# Patient Record
Sex: Female | Born: 1960
Health system: Southern US, Community
[De-identification: ages and names within clinical notes are randomized; demographics above are authoritative.]

## PROBLEM LIST (undated history)

## (undated) DIAGNOSIS — R200 Anesthesia of skin: Secondary | ICD-10-CM

## (undated) DIAGNOSIS — C801 Malignant (primary) neoplasm, unspecified: Secondary | ICD-10-CM

## (undated) DIAGNOSIS — I2699 Other pulmonary embolism without acute cor pulmonale: Secondary | ICD-10-CM

## (undated) DIAGNOSIS — T7840XA Allergy, unspecified, initial encounter: Secondary | ICD-10-CM

## (undated) DIAGNOSIS — D649 Anemia, unspecified: Secondary | ICD-10-CM

## (undated) DIAGNOSIS — I82409 Acute embolism and thrombosis of unspecified deep veins of unspecified lower extremity: Secondary | ICD-10-CM

## (undated) DIAGNOSIS — G609 Hereditary and idiopathic neuropathy, unspecified: Secondary | ICD-10-CM

## (undated) DIAGNOSIS — M8580 Other specified disorders of bone density and structure, unspecified site: Secondary | ICD-10-CM

## (undated) DIAGNOSIS — R011 Cardiac murmur, unspecified: Secondary | ICD-10-CM

## (undated) DIAGNOSIS — R202 Paresthesia of skin: Secondary | ICD-10-CM

## (undated) HISTORY — DX: Anesthesia of skin: R20.0

## (undated) HISTORY — DX: Allergy, unspecified, initial encounter: T78.40XA

## (undated) HISTORY — DX: Hereditary and idiopathic neuropathy, unspecified: G60.9

## (undated) HISTORY — DX: Cardiac murmur, unspecified: R01.1

## (undated) HISTORY — DX: Acute embolism and thrombosis of unspecified deep veins of unspecified lower extremity: I82.409

## (undated) HISTORY — DX: Other pulmonary embolism without acute cor pulmonale: I26.99

## (undated) HISTORY — DX: Malignant (primary) neoplasm, unspecified: C80.1

## (undated) HISTORY — DX: Anesthesia of skin: R20.2

## (undated) HISTORY — PX: OTHER SURGICAL HISTORY: SHX169

## (undated) HISTORY — DX: Other specified disorders of bone density and structure, unspecified site: M85.80

---

## 1997-10-23 ENCOUNTER — Other Ambulatory Visit: Admission: RE | Admit: 1997-10-23 | Discharge: 1997-10-23 | Payer: Self-pay | Admitting: *Deleted

## 2000-12-23 ENCOUNTER — Ambulatory Visit (HOSPITAL_COMMUNITY): Admission: RE | Admit: 2000-12-23 | Discharge: 2000-12-23 | Payer: Self-pay | Admitting: *Deleted

## 2000-12-23 ENCOUNTER — Encounter (INDEPENDENT_AMBULATORY_CARE_PROVIDER_SITE_OTHER): Payer: Self-pay | Admitting: *Deleted

## 2005-01-14 ENCOUNTER — Ambulatory Visit: Payer: Self-pay | Admitting: Internal Medicine

## 2005-01-21 ENCOUNTER — Ambulatory Visit: Payer: Self-pay | Admitting: Internal Medicine

## 2006-11-24 ENCOUNTER — Encounter: Admission: RE | Admit: 2006-11-24 | Discharge: 2006-11-24 | Payer: Self-pay | Admitting: Obstetrics & Gynecology

## 2007-11-25 ENCOUNTER — Encounter: Admission: RE | Admit: 2007-11-25 | Discharge: 2007-11-25 | Payer: Self-pay | Admitting: Obstetrics & Gynecology

## 2008-02-10 ENCOUNTER — Ambulatory Visit (HOSPITAL_COMMUNITY): Admission: RE | Admit: 2008-02-10 | Discharge: 2008-02-10 | Payer: Self-pay | Admitting: Chiropractic Medicine

## 2008-04-11 ENCOUNTER — Encounter (INDEPENDENT_AMBULATORY_CARE_PROVIDER_SITE_OTHER): Payer: Self-pay | Admitting: *Deleted

## 2008-06-07 ENCOUNTER — Ambulatory Visit: Payer: Self-pay | Admitting: Internal Medicine

## 2008-06-21 ENCOUNTER — Ambulatory Visit: Payer: Self-pay | Admitting: Internal Medicine

## 2008-11-28 ENCOUNTER — Encounter: Admission: RE | Admit: 2008-11-28 | Discharge: 2008-11-28 | Payer: Self-pay | Admitting: Obstetrics & Gynecology

## 2009-03-30 ENCOUNTER — Ambulatory Visit (HOSPITAL_COMMUNITY): Admission: RE | Admit: 2009-03-30 | Discharge: 2009-03-30 | Payer: Self-pay | Admitting: Obstetrics & Gynecology

## 2009-11-29 ENCOUNTER — Encounter
Admission: RE | Admit: 2009-11-29 | Discharge: 2009-11-29 | Payer: Self-pay | Source: Home / Self Care | Admitting: Obstetrics & Gynecology

## 2010-02-06 ENCOUNTER — Ambulatory Visit (HOSPITAL_COMMUNITY)
Admission: RE | Admit: 2010-02-06 | Discharge: 2010-02-06 | Payer: Self-pay | Source: Home / Self Care | Attending: Neurological Surgery | Admitting: Neurological Surgery

## 2010-02-12 NOTE — Letter (Signed)
Summary: Recall Colonoscopy Letter  [facility]  [address] [place], [place] [address]   Phone: [phone]  Fax: [phone]      April 11, 2008 MRN: [id number]   [facility] [address] [place], [place]  [address]   Dear Ms. Earll,   According to your medical record, it is time for you to schedule a Colonoscopy. The American Cancer Society recommends this procedure as a method to detect early colon cancer. Patients with a family history of colon cancer, or a personal history of colon polyps or inflammatory bowel disease are at increased risk.  This letter has beeen generated based on the recommendations made at the time of your procedure. If you feel that in your particular situation this may no longer apply, please contact our office.  Please call our office at [phone] to schedule this appointment or to update your records at your earliest convenience.  Thank you for cooperating with [place] to provide you with the very best care possible.   Sincerely,  Hedwig Morton. Juanda Chance, M.D.  [facility] [phone]

## 2010-02-12 NOTE — Miscellaneous (Signed)
Summary: [facility] Previsit/prep  Clinical Lists Changes  Medications: Added new medication of MOVIPREP 100 GM  SOLR (PEG-KCL-NACL-NASULF-NA ASC-C) As per prep instructions. - Signed Rx of MOVIPREP 100 GM  SOLR (PEG-KCL-NACL-NASULF-NA ASC-C) As per prep instructions.;  #1 x 0;  Signed;  Entered by: Wyona Almas RN;  Authorized by: Hart Carwin;  Method used: Electronically to [facility]*, [address], [place], [place]  [address], Ph: [phone], Fax: [phone] Observations: Added new observation of ALLERGY REV: Done (06/07/2008 13:35) Added new observation of NKA: T (06/07/2008 13:35)    Prescriptions: MOVIPREP 100 GM  SOLR (PEG-KCL-NACL-NASULF-NA ASC-C) As per prep instructions.  #1 x 0   Entered by:   Wyona Almas RN   Authorized by:   Hart Carwin   Signed by:   Wyona Almas RN on 06/07/2008   Method used:   Electronically to        [facility]* (retail)       [address]       [address] Shipping/mailing       [place], [place]  [address]       Ph: [phone]       Fax: [phone]   RxID:   [id number]   Appended Document: [facility] Previsit/prep    Clinical Lists Changes  Medications: Removed medication of MOVIPREP 100 GM  SOLR (PEG-KCL-NACL-NASULF-NA ASC-C) As per prep instructions. Added new medication of DULCOLAX 5 MG  TBEC (BISACODYL) Day before procedure take 2 at 3pm and 2 at 8pm. - Signed Added new medication of METOCLOPRAMIDE HCL 10 MG  TABS (METOCLOPRAMIDE HCL) As per prep instructions. - Signed Added new medication of MIRALAX   POWD (POLYETHYLENE GLYCOL 3350) As per prep  instructions. - Signed Rx of DULCOLAX 5 MG  TBEC (BISACODYL) Day before procedure take 2 at 3pm and 2 at 8pm.;  #4 x 0;  Signed;  Entered by: Wyona Almas RN;  Authorized by: Hart Carwin;  Method used: Electronically to [facility]*, [address] Shipping/mailing,  [place], [place]  [address], Ph: [phone], Fax: [phone] Rx of METOCLOPRAMIDE HCL 10 MG  TABS (METOCLOPRAMIDE HCL) As per prep instructions.;  #2 x 0;  Signed;  Entered by: Wyona Almas RN;  Authorized by: Hart Carwin;  Method used: Electronically to [facility]*, [address], [address] Shipping/mailing, [place], [place]  [address], Ph: [phone], Fax: [phone] Rx of MIRALAX   POWD (POLYETHYLENE GLYCOL 3350) As per prep  instructions.;  #255gm x 0;  Signed;  Entered by: Wyona Almas RN;  Authorized by: Hart Carwin;  Method used: Electronically to [facility]*, [address] Shipping/mailing, [place], [place]  [address], Ph: [phone], Fax: [phone]    Prescriptions: MIRALAX   POWD (POLYETHYLENE GLYCOL 3350) As per prep  instructions.  #255gm x 0   Entered by:   Wyona Almas RN   Authorized by:   Hart Carwin   Signed by:   Wyona Almas RN on 06/07/2008   Method used:   Electronically to        [facility]* (retail)       [address]       [address] Shipping/mailing       [place], [place]  [address]       Ph: [phone]  Fax: [phone]   RxID:   [id number] METOCLOPRAMIDE HCL 10 MG  TABS (METOCLOPRAMIDE HCL) As per prep instructions.  #2 x 0   Entered by:   Wyona Almas RN   Authorized by:   Hart Carwin   Signed by:   Wyona Almas RN on 06/07/2008   Method used:   Electronically to        [facility]* (retail)       [address]       [address] Shipping/mailing       [place], [place]  [address]       Ph: [phone]       Fax: [phone]   RxID:   [id number] DULCOLAX 5 MG  TBEC (BISACODYL) Day before procedure take 2 at 3pm and 2 at 8pm.  #4 x 0   Entered by:   Wyona Almas RN   Authorized by:   Hart Carwin   Signed by:   Wyona Almas RN on 06/07/2008   Method used:   Electronically to        [facility]*  (retail)       [address]       [address] Shipping/mailing       [place], [place]  [address]       Ph: [phone]       Fax: [phone]   RxID:   [id number]

## 2010-02-12 NOTE — Procedures (Signed)
Summary: Colonoscopy   Colonoscopy  Procedure date:  06/21/2008  Findings:      Location:  [facility].    Procedures Next Due Date:    Colonoscopy: 06/2013  COLONOSCOPY PROCEDURE REPORT  PATIENT:  Kristina Flores, Kristina Flores  MR#:  [id number] BIRTHDATE:   March 22, 1960, 48 yrs. old   GENDER:   female  ENDOSCOPIST:   Hedwig Morton. Juanda Chance, MD Referred by: Sigmund Hazel, M.D.  PROCEDURE DATE:  06/21/2008 PROCEDURE:  Higher-risk screening colonoscopy  colonoscopy via colostomy ASA CLASS:   Class I INDICATIONS: Elevated Risk Screening last colon 06/2003 was normal, colon Cancer in pt's father  MEDICATIONS:    Versed 7 mg, Fentanyl 50 mcg  DESCRIPTION OF PROCEDURE:   After the risks benefits and alternatives of the procedure were thoroughly explained, informed consent was obtained.  Digital rectal exam was performed and revealed no rectal masses.   The LB PCF-H180AL [id number] endoscope was introduced through the anus and advanced to the cecum, which was identified by both the appendix and ileocecal valve, without limitations.  The quality of the prep was good, using MiraLax.  The instrument was then slowly withdrawn as the colon was fully examined. <<PROCEDUREIMAGES>>        <<OLD IMAGES>>  FINDINGS:  Mild diverticulosis was found (see image3). several shallow diverticuli seen in the left and right colon  This was otherwise a normal examination of the colon (see image1, image2, and image4).   Retroflexed views in the rectum revealed no abnormalities.    The scope was then withdrawn from the patient and the procedure completed.  COMPLICATIONS:   None  ENDOSCOPIC IMPRESSION:  1) Mild diverticulosis  2) Otherwise normal examination  no polyps RECOMMENDATIONS:  1) high fiber diet  REPEAT EXAM:   In 5 year(s) for.   _______________________________ Hedwig Morton. Juanda Chance, MD  CC:

## 2010-04-07 LAB — CBC
HCT: 41 % (ref 36.0–46.0)
Hemoglobin: 13.9 g/dL (ref 12.0–15.0)
MCHC: 33.8 g/dL (ref 30.0–36.0)
MCV: 92.1 fL (ref 78.0–100.0)
Platelets: 248 10*3/uL (ref 150–400)
RBC: 4.45 MIL/uL (ref 3.87–5.11)
RDW: 13.2 % (ref 11.5–15.5)
WBC: 4.5 10*3/uL (ref 4.0–10.5)

## 2010-04-07 LAB — PREGNANCY, URINE: Preg Test, Ur: NEGATIVE

## 2010-04-17 ENCOUNTER — Other Ambulatory Visit: Payer: Self-pay | Admitting: Obstetrics & Gynecology

## 2010-05-31 NOTE — H&P (Signed)
[facility]  PatientBrandilyn, [facility] Visit Number: [id number] MRN: [id number]          Service Type: Attending:  Sung Amabile. Roslyn Smiling, M.D. Dictated by:   Sung Amabile Roslyn Smiling, M.D. Adm. Date:  12/23/00                           History and Physical  REASON FOR ADMISSION:         Outpatient surgery.  CHIEF COMPLAINT:              Persistent right adnexal complex mass.  HISTORY OF PRESENT ILLNESS:   A 50 year old woman, G4, P3-0-1-3, using vasectomy as contraception, admitted to the hospital for surgical evaluation of a persistent right adnexal complex mass.  She has been troubled with intermittent right lower quadrant discomfort which has no particular pattern for the past two years.  She mentioned this at her annual examination in January 2002.  Pelvic examination was normal, and hemoccult was negative.  She was encouraged to treat symptoms of diarrhea with increased fiber and water intake and return to the office if it was persistent.  She returned in September with complaints of chronic intermittent right lower quadrant pain which was associated with her cycle.  An ovarian cyst was identified on ultrasound.  It had no increased blood flow, and its size was 4.4 x 3.7 x 4.3. It had a hemorrhagic appearance, and oral contraceptives were initiated.  The patient returned to the office in mid November, and the mass was essentially unchanged.  CA125 was top normal range at 35.2.  Because of the persistent right adnexal mass which is believed to be ovarian, the patient is now admitted for diagnostic laparoscopy and possible right salpingo-oophorectomy.  PAST MEDICAL HISTORY:         Medical: None.  Surgical: Gardner cystic duct marsupialization.  D&E.  Obstetrical: Three vaginal deliveries.  MEDICATIONS:                  Calcium and Claritin p.r.n.  Flonase p.r.n.  ALLERGIES:                    None.  FAMILY HISTORY:               Mother died of leukemia when  the patient was 13.  SOCIAL HISTORY:               Married, homemaker.  Denies tobacco use.  Drinks socially one to two times weekly.  PHYSICAL EXAMINATION:  GENERAL:                      Healthy-appearing woman.  VITAL SIGNS:                  Afebrile. Vital signs stable.  HEENT:                        Within normal limits.  NECK:                         Without thyromegaly.  CHEST:                        Clear.  COR:  Regular rate and rhythm.  S1 and S2 normal.  BREASTS:                      Without masses or tenderness, axillary or supraclavicular nodes.  ABDOMEN:                      Soft, nontender, without organomegaly, mass, or hernia.  BACK:                         Without CVA tenderness.  GU:                           External genitalia, BUS, vagina without lesions.  Cervix without lesion.  Uterus mid position, normal size, nontender, mobile. Adnexa normal to palpation bilaterally.  Nontender.  Rectovaginal exam confirmatory.  EXTREMITIES:                  Without clubbing, cyanosis, or edema.  SKIN:                         Without lesions.  NEUROLOGIC:                   Grossly intact.  ASSESSMENT/PLAN:              1. Persistent right adnexal complex cyst, unchanged after two months of oral contraceptive use.  Most consistent with endometrioma or perhaps dermoid cyst.  Plan operative laparoscopy, possible cystectomy, possible right salpingo-oophorectomy, possible laparotomy if unable to perform surgery laparoscopically.  Frozen section will be performed intraoperatively. Dictated by:   Sung Amabile Roslyn Smiling, M.D. Attending:  Sung Amabile. Roslyn Smiling, M.D. DD:  12/22/00 TD:  12/22/00 Job: [id number]

## 2010-05-31 NOTE — Op Note (Signed)
[facility]  Patient:    Kristina Flores, Kristina Flores Visit Number: [id number] MRN: [id number]          Service Type: DSU Location: [facility] Attending Physician:  Ardeen Fillers Dictated by:   Sung Amabile. Roslyn Smiling, M.D. Proc. Date: 12/23/00 Admit Date:  12/23/2000                             Operative Report  INDICATIONS:                  A 50 year old woman, G4, P3-0-1-3, using vasectomy as contraception, admitted to the hospital for surgical evaluation of a persistent right adnexal complex mass.  This was identified initially by ultrasound in mid September.  No change in the mass was noted after a cycle of oral contraceptives.  She has had intermittent right lower quadrant discomfort for the past several years.  She is admitted now for operative evaluation.  PREOPERATIVE DIAGNOSIS:       Right adnexal mass.  POSTOPERATIVE DIAGNOSES:      1. Right broad ligament leiomyoma.                               2. Endometriosis.  PROCEDURE:                    Diagnostic laparoscopy.  SURGEON:                      Sung Amabile. Roslyn Smiling, M.D.  ASSISTANT:                    Silverio Lay, M.D.  ANESTHESIA:                   General anesthesia with endotracheal tube intubation.  ESTIMATED BLOOD LOSS:         Less than 10 cc.  TUBES AND DRAINS:             None.  COMPLICATIONS:                None.  FINDINGS:                     Top normal-sized, anteverted uterus.  A 4 cm right broad ligament fibroid.  Normal ovaries, tubes, appendix and upper abdomen.  The course of the right ureter was easily seen.  Small endometriosis implants of the cul-de-sac.  SPECIMENS:                    Pelvic/peritoneal fluid to cytology.  DESCRIPTION OF PROCEDURE:     After the establishment of general anesthesia, the patient was placed in the dorsal lithotomy position.  The abdomen, perineum and vagina were prepped with Betadine solution.  A Graves speculum was inserted into the vagina.  The  cervix was grasped with a single-tooth tenaculum.  A Hulka tenaculum was passes easily into the endometrial cavity. The Graves speculum was removed and a Foley catheter was placed.  The patient was draped.  A subumbilical vertical incision was made on the skin with a knife.  The fat was dissected and the fascia was identified.  The fascia was entered sharply and the peritoneum was visualized.  The peritoneum was entered bluntly, taking care to avoid injury to the underlying viscera.  A pursestring suture of 0 Vicryl was placed  in the fascia.  An open laparoscopic sleeve and trocar were inserted into the abdominal cavity.  The trocar was remove and the scope was inserted.  CO2 was insufflated.  A suprapubic 5 mm port was placed under direct visualization.  The above findings were noted.  A small amount of pelvic fluid was evacuated with Nezhat suction and sent to cytology.  Because of the position of the fibroid and normal-appearing ovaries, the decision was made to perform only a diagnostic laparoscopy.  Options will be given to the patient postoperatively.  The suprapubic port was removed under direct visualization.  The subumbilical port was removed and the pursestring suture of the fascia was tied.  The incision was irrigated with warm normal saline.  The fat was closed with single mattress suture of 0 Vicryl.  The subumbilical skin incision was closed with subcuticular stitch of 4-0 chromic.  The suprapubic port was closed with Dermabond.  The vaginal instruments were removed and Foley catheter was removed.  Final sponge, needle and blade counts were correct.  The patient was returned to the supine position, extubated without difficulty and transported to the recovery room in satisfactory condition. Dictated by:   Sung Amabile Roslyn Smiling, M.D. Attending Physician:  Ardeen Fillers DD:  12/23/00 TD:  12/23/00 Job: [id number]

## 2010-10-24 ENCOUNTER — Other Ambulatory Visit: Payer: Self-pay | Admitting: Family Medicine

## 2010-10-24 DIAGNOSIS — Z1231 Encounter for screening mammogram for malignant neoplasm of breast: Secondary | ICD-10-CM

## 2010-12-10 ENCOUNTER — Ambulatory Visit: Payer: Self-pay

## 2011-01-21 ENCOUNTER — Ambulatory Visit
Admission: RE | Admit: 2011-01-21 | Discharge: 2011-01-21 | Disposition: A | Discharge status: Home / Self Care | Payer: 59 | Source: Ambulatory Visit | Attending: Family Medicine | Admitting: Family Medicine

## 2011-01-21 DIAGNOSIS — Z1231 Encounter for screening mammogram for malignant neoplasm of breast: Secondary | ICD-10-CM

## 2011-09-04 ENCOUNTER — Encounter: Payer: Self-pay | Admitting: Family Medicine

## 2011-09-18 ENCOUNTER — Ambulatory Visit (INDEPENDENT_AMBULATORY_CARE_PROVIDER_SITE_OTHER): Payer: 59 | Admitting: Family Medicine

## 2011-09-18 ENCOUNTER — Encounter: Payer: Self-pay | Admitting: Family Medicine

## 2011-09-18 VITALS — BP 120/78 | HR 76 | Temp 98.1°F | Resp 16 | Ht 64.0 in | Wt 167.2 lb

## 2011-09-18 DIAGNOSIS — R202 Paresthesia of skin: Secondary | ICD-10-CM

## 2011-09-18 DIAGNOSIS — Z13220 Encounter for screening for lipoid disorders: Secondary | ICD-10-CM

## 2011-09-18 DIAGNOSIS — R209 Unspecified disturbances of skin sensation: Secondary | ICD-10-CM

## 2011-09-18 DIAGNOSIS — M5137 Other intervertebral disc degeneration, lumbosacral region: Secondary | ICD-10-CM

## 2011-09-18 DIAGNOSIS — Z0000 Encounter for general adult medical examination without abnormal findings: Secondary | ICD-10-CM

## 2011-09-18 DIAGNOSIS — M5136 Other intervertebral disc degeneration, lumbar region: Secondary | ICD-10-CM

## 2011-09-18 LAB — POCT URINALYSIS DIPSTICK
Bilirubin, UA: NEGATIVE
Blood, UA: NEGATIVE
Glucose, UA: NEGATIVE
Ketones, UA: NEGATIVE
Nitrite, UA: NEGATIVE
Protein, UA: NEGATIVE
Spec Grav, UA: 1.02
Urobilinogen, UA: 0.2
pH, UA: 6

## 2011-09-18 LAB — COMPREHENSIVE METABOLIC PANEL
ALT: 17 U/L (ref 0–35)
AST: 21 U/L (ref 0–37)
Albumin: 4 g/dL (ref 3.5–5.2)
Alkaline Phosphatase: 58 U/L (ref 39–117)
BUN: 17 mg/dL (ref 6–23)
CO2: 29 mEq/L (ref 19–32)
Calcium: 9.3 mg/dL (ref 8.4–10.5)
Chloride: 106 mEq/L (ref 96–112)
Creat: 0.76 mg/dL (ref 0.50–1.10)
Glucose, Bld: 80 mg/dL (ref 70–99)
Potassium: 4.3 mEq/L (ref 3.5–5.3)
Sodium: 142 mEq/L (ref 135–145)
Total Bilirubin: 0.3 mg/dL (ref 0.3–1.2)
Total Protein: 6.6 g/dL (ref 6.0–8.3)

## 2011-09-18 LAB — LIPID PANEL
Cholesterol: 164 mg/dL (ref 0–200)
HDL: 76 mg/dL (ref >39)
LDL Cholesterol: 78 mg/dL (ref 0–99)
Total CHOL/HDL Ratio: 2.2 Ratio
Triglycerides: 50 mg/dL (ref <150)
VLDL: 10 mg/dL (ref 0–40)

## 2011-09-18 NOTE — Progress Notes (Signed)
  Subjective:    Patient ID: Kristina Flores, female    DOB: 18-Feb-1960, 51 y.o.   MRN: [id number]  HPI This 51 y.o. Cauc female is here for CPE (no PAP). She takes no chronic prescription medications  but does take OTC anti-histamine for A.R. She has chronic LBP and neck pain for which she takes  Ibuprofen and tries to maintain good fitness with pilates and aerobics. She works as a Set designer-  Lawyer. She is married, is a nonsmoker and consumes alcohol (1 drink 5x/week).   Last PAP: March 2013 (normal)  MMG: Dec 2012 (normal)  Colonoscopy: 2010 (normal)   Review of Systems  Constitutional: Negative.   HENT: Positive for neck pain and neck stiffness.   Eyes: Negative.   Respiratory: Negative.   Cardiovascular: Negative.   Gastrointestinal: Negative.   Musculoskeletal: Positive for back pain.  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   Psychiatric/Behavioral: Negative.        Objective:   Physical Exam  Nursing note and vitals reviewed. Constitutional: She is oriented to person, place, and time. She appears well-developed and well-nourished. No distress.  HENT:  Head: Normocephalic and atraumatic.  Right Ear: Hearing, tympanic membrane and external ear normal.  Left Ear: Hearing, tympanic membrane, external ear and ear canal normal.  Nose: Nose normal. No nasal deformity or septal deviation.  Mouth/Throat: Uvula is midline, oropharynx is clear and moist and mucous membranes are normal. No oral lesions. Normal dentition. No dental caries.  Eyes: Conjunctivae, EOM and lids are normal. Pupils are equal, round, and reactive to light. No scleral icterus.  Neck: Normal range of motion. Neck supple. No thyromegaly present.  Cardiovascular: Normal rate, regular rhythm, normal heart sounds and intact distal pulses.  Exam  reveals no gallop and no friction rub.   No murmur heard. Pulmonary/Chest: Effort normal and breath sounds normal. No respiratory distress. She exhibits no tenderness.  Abdominal: Soft. Bowel sounds are normal. She exhibits no pulsatile midline mass and no mass. There is no hepatosplenomegaly. There is no tenderness. There is no guarding and no CVA tenderness.  Genitourinary:       PAP and breast exam -GYN  Musculoskeletal: Normal range of motion. She exhibits no edema and no tenderness.       Upper back and shoulder area- minimal muscle spasm and tenderness with palpation.  Lymphadenopathy:    She has no cervical adenopathy.  Neurological: She is alert and oriented to person, place, and time. She has normal reflexes. No cranial nerve deficit. She exhibits normal muscle tone. Coordination normal.  Skin: Skin is warm and dry. No rash noted. No erythema. No pallor.  Psychiatric: She has a normal mood and affect. Her behavior is normal. Judgment and thought content normal.          Assessment & Plan:   1. Routine general medical examination at a health care facility  Comprehensive metabolic panel, POCT urinalysis dipstick  2. Lipid screening  Lipid panel  3. Paresthesias  Vitamin D, 25-hydroxy, Comprehensive metabolic panel Advised daily Vitamin D supplement  4. DDD (degenerative disc disease), lumbar  Continue current fitness routine Non-pharmaceuticals that may help reduce pain and inflammation discussed with pt.

## 2011-09-18 NOTE — Patient Instructions (Addendum)

## 2011-09-19 LAB — VITAMIN D 25 HYDROXY (VIT D DEFICIENCY, FRACTURES): Vit D, 25-Hydroxy: 42 ng/mL (ref 30–89)

## 2011-09-20 ENCOUNTER — Encounter: Payer: Self-pay | Admitting: Family Medicine

## 2011-09-20 DIAGNOSIS — J309 Allergic rhinitis, unspecified: Secondary | ICD-10-CM | POA: Insufficient documentation

## 2011-09-20 DIAGNOSIS — Z780 Asymptomatic menopausal state: Secondary | ICD-10-CM | POA: Insufficient documentation

## 2011-09-20 DIAGNOSIS — R202 Paresthesia of skin: Secondary | ICD-10-CM | POA: Insufficient documentation

## 2011-09-20 DIAGNOSIS — M5136 Other intervertebral disc degeneration, lumbar region: Secondary | ICD-10-CM | POA: Insufficient documentation

## 2011-09-20 NOTE — Progress Notes (Signed)
Quick Note:  Please notify pt that results are normal.   Provide pt with copy of labs. ______ 

## 2011-09-22 ENCOUNTER — Encounter: Payer: Self-pay | Admitting: *Deleted

## 2011-10-31 ENCOUNTER — Ambulatory Visit (INDEPENDENT_AMBULATORY_CARE_PROVIDER_SITE_OTHER): Payer: 59 | Admitting: Family Medicine

## 2011-10-31 ENCOUNTER — Encounter: Payer: Self-pay | Admitting: Physician Assistant

## 2011-10-31 VITALS — BP 110/68 | HR 80 | Temp 98.0°F | Resp 16 | Ht 64.0 in | Wt 169.6 lb

## 2011-10-31 DIAGNOSIS — M79609 Pain in unspecified limb: Secondary | ICD-10-CM

## 2011-10-31 DIAGNOSIS — Z23 Encounter for immunization: Secondary | ICD-10-CM

## 2011-10-31 DIAGNOSIS — M79603 Pain in arm, unspecified: Secondary | ICD-10-CM

## 2011-10-31 NOTE — Progress Notes (Signed)
  Subjective:    Patient ID: Kristina Flores, female    DOB: [birth year], 51 y.o.   MRN: [id number]  HPI 51 year old female presents with several months of right arm "throbbing, pulsing" sensation.  States it has been occuring intermittently x 2 months and is not painful, just bothersome.  She is active and works out regularly, but says it is not related to movement or heavy lifting.  Has tried ibuprofen which does not seem to have helped at all.  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  She is an otherwise healthy woman. Had a CPE in September which revealed all normal labwork.     Review of Systems  Constitutional: Negative for fever and chills.  Skin: Negative.   Neurological: Negative.   All other systems reviewed and are negative.       Objective:   Physical Exam  Constitutional: She is oriented to person, place, and time. She appears well-developed and well-nourished.  HENT:  Head: Normocephalic and atraumatic.  Right Ear: External ear normal.  Left Ear: External ear normal.  Eyes: Conjunctivae normal are normal.  Neck: Normal range of motion.  Cardiovascular: Normal rate, regular rhythm and normal heart sounds.   Pulmonary/Chest: Effort normal and breath sounds normal.  Musculoskeletal:       Right upper arm: She exhibits no tenderness, no bony tenderness, no swelling and no edema.  Neurological: She is alert and oriented to person, place, and time.  Psychiatric: She has a normal mood and affect. Her behavior is normal. Judgment and thought content normal.          Assessment & Plan:   1. Need for influenza vaccination  Flu vaccine greater than or equal to 3yo preservative free IM  2. Arm pain  Upper extremity Venous Duplex Right   Will go ahead with [place] of right arm. If negative, recommend watchful waiting.  Patient agreeable to this plan.

## 2011-11-05 NOTE — Progress Notes (Signed)
Patient discussed with Heather Marte, PA-C. Agree with assessment and plan of care per her note.   

## 2011-12-22 ENCOUNTER — Other Ambulatory Visit: Payer: Self-pay | Admitting: Family Medicine

## 2011-12-22 DIAGNOSIS — Z1231 Encounter for screening mammogram for malignant neoplasm of breast: Secondary | ICD-10-CM

## 2012-01-27 ENCOUNTER — Ambulatory Visit
Admission: RE | Admit: 2012-01-27 | Discharge: 2012-01-27 | Disposition: A | Discharge status: Home / Self Care | Payer: 59 | Source: Ambulatory Visit | Attending: Family Medicine | Admitting: Family Medicine

## 2012-01-27 DIAGNOSIS — Z1231 Encounter for screening mammogram for malignant neoplasm of breast: Secondary | ICD-10-CM

## 2012-03-02 ENCOUNTER — Ambulatory Visit (INDEPENDENT_AMBULATORY_CARE_PROVIDER_SITE_OTHER): Payer: 59 | Admitting: Family Medicine

## 2012-03-02 VITALS — BP 120/82 | Ht 64.5 in | Wt 165.0 lb

## 2012-03-02 DIAGNOSIS — M999 Biomechanical lesion, unspecified: Secondary | ICD-10-CM | POA: Insufficient documentation

## 2012-03-02 DIAGNOSIS — M5137 Other intervertebral disc degeneration, lumbosacral region: Secondary | ICD-10-CM

## 2012-03-02 DIAGNOSIS — M5136 Other intervertebral disc degeneration, lumbar region: Secondary | ICD-10-CM

## 2012-03-02 DIAGNOSIS — M9981 Other biomechanical lesions of cervical region: Secondary | ICD-10-CM

## 2012-03-02 MED ORDER — MELOXICAM 15 MG PO TABS: 15.0000 mg | ORAL_TABLET | Freq: Every day | ORAL | Status: DC

## 2012-03-02 MED ORDER — CYCLOBENZAPRINE HCL 10 MG PO TABS: 10.0000 mg | ORAL_TABLET | Freq: Every evening | ORAL | Status: DC | PRN

## 2012-03-02 NOTE — Progress Notes (Signed)
Chief complaint: Neck and low back pain.  History of present illness: Patient is a 52 year old female who has had date for 4 years and back pain for 3 years. Regarding her neck pain: Patient states that this has been left-sided for quite some time. Patient has had imaging of her neck done previously. Patient has been told previously she does have some degenerative changes of the cervical neck with an osteophyte formation.  MRI done in 2008 shows diffuse spondylosis and some mild spurring at C6-7. This was reviewed by me today. Patient states that it seems to be more chronic on the left side with some mild radiation down to the clavicle region. Patient denies any radiation further any weakness of the upper Jevity. Patient denies any headaches or any visual changes. Patient states she does not know what movements seem to make her worse. Patient has taken Aleve previously which does give her some mild improvement. Does not remember any specific injury.  Regarding her back pain: Patient has had chronic low back pain for approximately 3 years. Patient was again does not remember any specific injury. Patient has had imaging of this and had a lumbar spine MRI done in January 2012 which was reviewed today. Patient's MRI does show some L4-5 moderate bilateral facet arthritis with grade 1 anterior lip uses. Patient also has some degenerative disc disease of the L2-L3, L3-L4, and L4-L5 regions. Patient states from time to time it has radiated down her leg but does not do that anymore. Patient did have a transforaminal block of the L4-L5 back in March 2012 with minimal improvement. Patient has also been 2 rounds of physical therapy with minimal improvement. Patient is also going to a chiropractor with minimal improvement. Patient is here for potential manipulation medicine when she was told by a friend but it was somewhat beneficial. Patient denies any weakness of the legs bilaterally and denies any bowel or bladder  incontinence.  Past Medical History  Diagnosis Date  . Allergy   . Heart murmur    Past Surgical History  Procedure Laterality Date  . Miscarriage 1989    . Ovarian cyst 1992    . Fibroid in 2011,2003     No family history on file. History  Substance Use Topics  . Smoking status: Never Smoker   . Smokeless tobacco: Not on file  . Alcohol Use: Yes   Patient works as a Marketing executive.  Physical exam Blood pressure 120/82, height 5' 4.5" (1.638 m), weight 165 lb (74.844 kg). General: No apparent distress alert and oriented x3 mood and affect normal Respiratory: Patient's speak in full sentences and does not appear short of breath Skin: Warm dry intact with no signs of infection or rash Neuro: Cranial nerves II through XII are intact, neurovascularly intact in all extremities with 2+ DTRs and 2+ pulses. Neck: Negative spurling's Full neck range of motion Grip strength and sensation normal in bilateral hands Strength good C4 to T1 distribution No sensory change to C4 to T1 Reflexes normal Low back exam: Patient has full range of motion. Patient does have a significantly core. Patient has a negative straight leg and a negative Faber test bilaterally. Patient is out of 5 strength in lower chin is bilaterally. Patient does have some paraspinal musculature tenderness bilaterally left greater than right.  OMT Physical Exam  Standing structural        Standing flexion right  Seated Flexion right  Cervical  C2 flexed rotated and side bent left patient does  have a spur in this region. C5 flexed rotated and side bent right  Thoracic T4 extended rotated and side bent left T10 E RS right  Lumbar L2 F RS right  Sacrum Left on left  Illium Right anterior illium

## 2012-03-02 NOTE — Patient Instructions (Signed)
Very nice to meet you.  We will try these exercises.  Try to do them most days of the week.  Focus on core strengthening exercises.  I am giving you Meloxicam and flexeril.  Take as directed.  I want to see you again in 3-4 weeks.

## 2012-03-02 NOTE — Assessment & Plan Note (Addendum)
Decision today to treat with OMT was based on Physical Exam  After verbal consent patient was treated with ME, articular techniques in lumbar, sacral, illium and cervical areas  Patient tolerated the procedure well with improvement in symptoms  Patient given exercises, stretches and lifestyle modifications  See medications in patient instructions if given  Patient will follow up in 3-6 weeks 

## 2012-03-02 NOTE — Assessment & Plan Note (Signed)
Decision today to treat with OMT was based on Physical Exam  After verbal consent patient was treated with ME, articular techniques in lumbar, sacral, illium and cervical areas  Patient tolerated the procedure well with improvement in symptoms  Patient given exercises, stretches and lifestyle modifications  See medications in patient instructions if given  Patient will follow up in 3-6 weeks

## 2012-03-02 NOTE — Assessment & Plan Note (Signed)
Patient does have radiological evidence addition she has some degenerative disc disease. Patient does not have any radiculopathy the make the think there is a pinched nerve at this time. Patient is given a lot of exercises including hip and core strengthening as well as lumbar flexibility. Patient will try this with home exercise program as well as anti-inflammatories which were given per medication orders. Patient also given Flexeril to take at night if necessary. Discussed other home modalities in sleeping position that could be beneficial. Patient will come back again in 3-6 weeks.

## 2012-03-23 ENCOUNTER — Ambulatory Visit: Payer: 59 | Admitting: Family Medicine

## 2012-03-30 ENCOUNTER — Ambulatory Visit (INDEPENDENT_AMBULATORY_CARE_PROVIDER_SITE_OTHER): Payer: 59 | Admitting: Family Medicine

## 2012-03-30 ENCOUNTER — Encounter: Payer: Self-pay | Admitting: Family Medicine

## 2012-03-30 VITALS — BP 120/83 | HR 80 | Ht 64.5 in | Wt 164.0 lb

## 2012-03-30 DIAGNOSIS — M9981 Other biomechanical lesions of cervical region: Secondary | ICD-10-CM

## 2012-03-30 DIAGNOSIS — M999 Biomechanical lesion, unspecified: Secondary | ICD-10-CM

## 2012-03-30 NOTE — Patient Instructions (Signed)
Good to see you Remeber the two tennis balls duct tape together at the base of the skull. Do exercises at least 3 times a week.  Meloxicam 3 days whenever you feel significant tightness. Come back and see me in 6-8 weeks (Melanie ok to book for neck pain)

## 2012-03-30 NOTE — Progress Notes (Signed)
Chief complaint: Neck and low back pain  History of present patient is here for followup of her back pain. Patient does have some degenerative disc disease was given a home exercise program, formal physical therapy, and hip and core strengthening exercises. Patient was also given anti-inflammatory Flexeril to help. Patient states that she is doing very well and that the manipulation that she had time he previously did help except for the last week it started to return. Patient denies any radiculopathy denies any numbness or weakness in the extremity. Overall she has made significant improvements. Patient is here for more osteopathic manipulation.  Physical exam Blood pressure 120/83, pulse 80, height 5' 4.5" (1.638 m), weight 164 lb (74.39 kg). General: No apparent distress alert and oriented x3 mood and affect normal Respiratory: Patient's speak in full sentences and does not appear short of breath Skin: Warm dry intact with no signs of infection or rash Neuro: Cranial nerves II through XII are intact, neurovascularly intact in all extremities with 2+ DTRs and 2+ pulses. Neck:  Negative spurling's  Full neck range of motion  Grip strength and sensation normal in bilateral hands  Strength good C4 to T1 distribution  No sensory change to C4 to T1  Reflexes normal   OMT Physical Exam   Cervical  C2 flexed rotated and side bent left patient does have a spur in this region.  C5 flexed rotated and side bent right  Thoracic  T4 extended rotated and side bent left  T10 E RS right  Lumbar  L2 F RS right  Sacrum  Left on left  Illium  nuetral

## 2012-03-30 NOTE — Assessment & Plan Note (Signed)
.  Decision today to treat with OMT was based on Physical Exam  After verbal consent patient was treated with HVLA, ME techniques in the cervical, thoracic and lumbar areas  Patient tolerated the procedure well with improvement in symptoms  Patient given exercises, stretches and lifestyle modifications  See medications in patient instructions if given  Patient will follow up in 8-12 weeks

## 2012-05-22 ENCOUNTER — Ambulatory Visit (INDEPENDENT_AMBULATORY_CARE_PROVIDER_SITE_OTHER): Payer: 59 | Admitting: Family Medicine

## 2012-05-22 VITALS — BP 120/86 | HR 62 | Temp 97.9°F | Resp 16 | Ht 64.58 in | Wt 161.0 lb

## 2012-05-22 DIAGNOSIS — L259 Unspecified contact dermatitis, unspecified cause: Secondary | ICD-10-CM

## 2012-05-22 DIAGNOSIS — R21 Rash and other nonspecific skin eruption: Secondary | ICD-10-CM

## 2012-05-22 DIAGNOSIS — L309 Dermatitis, unspecified: Secondary | ICD-10-CM

## 2012-05-22 LAB — POCT SKIN KOH: Skin KOH, POC: NEGATIVE

## 2012-05-22 MED ORDER — TRIAMCINOLONE ACETONIDE 0.1 % EX CREA: TOPICAL_CREAM | Freq: Two times a day (BID) | CUTANEOUS | Status: DC

## 2012-05-22 NOTE — Progress Notes (Signed)
Subjective: Patient has a single erythematous patch of rash on her left forearm. This been about 3 weeks. It really doesn't have much symptoms to it other than being a rash that process. He has not blistered.  Objective: Right erythema rash 1.5 cm in diameter. There is a little sore call of peeling skin that goes around the border about 1 mm from the perimeter of the border. Skin scraping was taken and was negative.  Assessment: Dermatitis left forearm, uncertain etiology  Plan: Triamcinolone cream Return if persists in which case we might need to do a punch biopsy.

## 2012-05-22 NOTE — Patient Instructions (Addendum)
This is a nonspecific rash. It does not appear to be a fungus. Use the cream twice daily on it. I would hope that over the next 7-10 days to be pretty well cleared up. If you keep having symptoms please return

## 2012-05-25 ENCOUNTER — Encounter: Payer: Self-pay | Admitting: Family Medicine

## 2012-05-25 ENCOUNTER — Ambulatory Visit (INDEPENDENT_AMBULATORY_CARE_PROVIDER_SITE_OTHER): Payer: 59 | Admitting: Family Medicine

## 2012-05-25 VITALS — BP 123/78 | HR 77 | Ht 64.5 in | Wt 160.0 lb

## 2012-05-25 DIAGNOSIS — M999 Biomechanical lesion, unspecified: Secondary | ICD-10-CM

## 2012-05-25 NOTE — Progress Notes (Signed)
Chief complaint: Neck and low back pain  History of present patient is here for followup of her back pain and neck pain. Patient does have some degenerative disc disease was given a home exercise program, formal physical therapy, and hip and core strengthening exercises.  Since last visit continues to do the home exercises and is doing very well. Patient did have one flare since last time we seen her and she did take her anti-inflammatories and Flexeril and for 3 days and got significantly better.  Patient states that she is doing very well and that the manipulation that she had time he previously has helped.  States she has not fel this good in years. Patient denies any radiculopathy denies any numbness or weakness in the extremity. Patient is here for more osteopathic manipulation.  Physical exam Blood pressure 123/78, pulse 77, height 5' 4.5" (1.638 m), weight 160 lb (72.576 kg). General: No apparent distress alert and oriented x3 mood and affect normal Respiratory: Patient's speak in full sentences and does not appear short of breath Skin: Warm dry intact with no signs of infection or rash Neuro: Cranial nerves II through XII are intact, neurovascularly intact in all extremities with 2+ DTRs and 2+ pulses. Neck:  Negative spurling's  Full neck range of motion  Grip strength and sensation normal in bilateral hands  Strength good C4 to T1 distribution  No sensory change to C4 to T1  Reflexes normal   OMT Physical Exam   Cervical  C3 flexed rotated and side bent left patient does have a spur in this region.  C4 flexed rotated and side bent right  Thoracic  T7 extended rotated and side bent left  T9 E RS right  Lumbar  L2 F RS right  Sacrum  Left on left  Illium  nuetral

## 2012-05-25 NOTE — Assessment & Plan Note (Signed)
Decision today to treat with OMT was based on Physical Exam  After verbal consent patient was treated with HVLA and ME techniques in thoraco lumbar and sacral areas  Patient tolerated the procedure well with improvement in symptoms  Patient given exercises, stretches and lifestyle modifications, discussed more focus on the core.   See medications in patient instructions if given  Patient will follow up in 6-8 weeks

## 2012-06-23 ENCOUNTER — Other Ambulatory Visit: Payer: Self-pay | Admitting: *Deleted

## 2012-06-23 MED ORDER — MELOXICAM 15 MG PO TABS: 15.0000 mg | ORAL_TABLET | Freq: Every day | ORAL | Status: DC

## 2012-06-23 MED ORDER — CYCLOBENZAPRINE HCL 10 MG PO TABS: 10.0000 mg | ORAL_TABLET | Freq: Every evening | ORAL | Status: DC | PRN

## 2013-01-14 ENCOUNTER — Ambulatory Visit: Payer: 59

## 2013-01-14 ENCOUNTER — Ambulatory Visit (INDEPENDENT_AMBULATORY_CARE_PROVIDER_SITE_OTHER): Payer: 59 | Admitting: Emergency Medicine

## 2013-01-14 VITALS — BP 102/68 | HR 69 | Temp 98.0°F | Resp 18 | Ht 64.0 in | Wt 165.0 lb

## 2013-01-14 DIAGNOSIS — M25561 Pain in right knee: Secondary | ICD-10-CM

## 2013-01-14 DIAGNOSIS — M25569 Pain in unspecified knee: Secondary | ICD-10-CM

## 2013-01-14 NOTE — Progress Notes (Addendum)
Subjective:  This chart was scribed for Arlyss Queen, MD by Roxan Diesel, ED scribe.  This patient was seen in [facility] and the patient's care was started at 6:37 PM.   Patient ID: Kristina Flores, female    DOB: [birth year], 53 y.o.   MRN: [id number]  Chief Complaint  Patient presents with  . lump on back of knee    right- noticed today     HPI  HPI Comments: Kristina Flores is a 53 y.o. female who presents to [facility] complaining of pain and swelling to the posterior right knee.  Pt states that the back of her right knee became slightly sore yesterday.  Pain was worse this morning and when she felt the back of the knee she noticed a "lump" to that area.  She notes that she walked quite a bit the day before pain began.  She has no other complaints at this time.   Patient Active Problem List   Diagnosis Date Noted  . Nonallopathic lesion of lumbosacral region 03/02/2012  . Nonallopathic lesion of cervical region 03/02/2012  . Paresthesias 09/20/2011  . DDD (degenerative disc disease), lumbar 09/20/2011  . Menopause 09/20/2011  . Allergic rhinitis 09/20/2011    Past Medical History  Diagnosis Date  . Allergy   . Heart murmur     Past Surgical History  Procedure Laterality Date  . Miscarriage 1989    . Ovarian cyst 1992    . Fibroid in 2011,2003      Prior to Admission medications   Medication Sig Start Date End Date Taking? Authorizing Provider  BLACK COHOSH EXTRACT PO Take by mouth.   Yes Historical Provider, MD  cyclobenzaprine (FLEXERIL) 10 MG tablet Take 1 tablet (10 mg total) by mouth at bedtime as needed for muscle spasms. 06/23/12  Yes Lyndal Pulley, DO  meloxicam (MOBIC) 15 MG tablet Take 1 tablet (15 mg total) by mouth daily. 06/23/12  Yes Lyndal Pulley, DO  Multiple Vitamin (MULTIVITAMIN) tablet Take 1 tablet by mouth daily.   Yes Historical Provider, MD  triamcinolone cream (KENALOG) 0.1 % Apply topically 2 (two) times daily. 05/22/12  Yes Posey Boyer, MD    urea (CARMOL) 10 % cream Apply topically as needed.   Yes Historical Provider, MD  Wound Dressings (ATRAPRO HYDROGEL) GEL Apply topically.   Yes Historical Provider, MD  cetirizine (ZYRTEC) 10 MG tablet Take 10 mg by mouth daily.    Historical Provider, MD  loratadine (CLARITIN) 10 MG tablet Take 10 mg by mouth daily.    Historical Provider, MD  Misc Natural Products (TART CHERRY ADVANCED PO) Take by mouth.    Historical Provider, MD  [place]    Historical Provider, MD     Review of Systems  Musculoskeletal: Positive for arthralgias (right knee) and joint swelling (behind right knee).  Neurological: Negative for weakness and numbness.        Objective:   Physical Exam CONSTITUTIONAL: Well developed/well nourished HEAD: Normocephalic/atraumatic EYES: EOMI/PERRL ENMT: Mucous membranes moist NECK: supple no meningeal signs SPINE:entire spine nontender CV: S1/S2 noted, no murmurs/rubs/gallops noted LUNGS: Lungs are clear to auscultation bilaterally, no apparent distress ABDOMEN: soft, nontender, no rebound or guarding GU:no cva tenderness NEURO: Pt is awake/alert, moves all extremitiesx4 EXTREMITIES: pulses normal, full ROM there is a lemon sized irregular mass behind the right knee. This does resolve somewhat flexion of the knee. The knee itself is not swollen there is no instability  there is no laxity and there is a negative anterior drawer sign SKIN: warm, color normal PSYCH: no abnormalities of mood noted  [facility] reading (PRIMARY) by  Dr. Everlene Farrier x-rays are normal        Assessment & Plan:   Signs and symptoms most consistent with a Baker's cyst. Advised her to have limited activity orthopedic referral made .

## 2013-01-18 ENCOUNTER — Other Ambulatory Visit: Payer: Self-pay

## 2013-01-18 DIAGNOSIS — Z1231 Encounter for screening mammogram for malignant neoplasm of breast: Secondary | ICD-10-CM

## 2013-01-26 ENCOUNTER — Ambulatory Visit (INDEPENDENT_AMBULATORY_CARE_PROVIDER_SITE_OTHER): Payer: 59 | Admitting: Sports Medicine

## 2013-01-26 ENCOUNTER — Encounter: Payer: Self-pay | Admitting: Sports Medicine

## 2013-01-26 VITALS — BP 146/89 | HR 64 | Ht 64.0 in | Wt 165.0 lb

## 2013-01-26 DIAGNOSIS — M7120 Synovial cyst of popliteal space [Baker], unspecified knee: Secondary | ICD-10-CM

## 2013-01-26 MED ORDER — MELOXICAM 15 MG PO TABS: 15.0000 mg | ORAL_TABLET | Freq: Every day | ORAL | Status: DC

## 2013-01-26 NOTE — Progress Notes (Signed)
   Subjective:    Patient ID: Kristina Flores, female    DOB: 1960/05/17, 53 y.o.   MRN: [id number]  HPI chief complaint: Right knee swelling  Very pleasant 53 year old female comes in today complaining of 2 weeks of posterior right knee swelling. She was seen at [facility] urgent care and x-rays of her knee were taken which are available for review. She denies any significant problems with the knee in the past. She is getting some mild diffuse knee discomfort but nothing severe. No mechanical symptoms. No trauma. She has not tried any real treatment for this swelling. It does not appear to get worse with activity. No fevers or chills.  Past medical history is reviewed Medications are reviewed Allergies are reviewed    Review of Systems     Objective:   Physical Exam Well-developed, well-nourished. No acute distress. Awake alert and oriented x3. Vital signs are reviewed  Right knee: Range of motion 0-130. No effusion. Trace patellofemoral crepitus. There is a palpable Baker's cyst in the popliteal fossa. It is nontender to palpation. No erythema. Knee is stable to ligamentous exam. No joint line tenderness. Neurovascularly intact distally. Walking without a limp to  X-rays of the right knee show no significant degenerative changes. No effusion.       Assessment & Plan:  Right knee swelling secondary to a Baker's cyst  I discussed the patient's treatment options including oral anti-inflammatories, intra-articular cortisone injection, and an ultrasound guided aspiration an injection of the cyst. We are going to start with meloxicam 15 mg daily for 7 days and I will give her a body helix sleeve to wear when active. She will followup with me in 3 weeks. If swelling persists or worsens we could reconsider injection. I think she is okay to continue with activity as tolerated. Call with questions or concerns prior to her followup visit.

## 2013-02-03 ENCOUNTER — Ambulatory Visit
Admission: RE | Admit: 2013-02-03 | Discharge: 2013-02-03 | Disposition: A | Discharge status: Home / Self Care | Payer: 59 | Source: Ambulatory Visit

## 2013-02-03 DIAGNOSIS — Z1231 Encounter for screening mammogram for malignant neoplasm of breast: Secondary | ICD-10-CM

## 2013-02-07 ENCOUNTER — Ambulatory Visit: Payer: 59 | Admitting: Sports Medicine

## 2013-02-16 ENCOUNTER — Other Ambulatory Visit: Payer: Self-pay | Admitting: *Deleted

## 2013-02-16 MED ORDER — CYCLOBENZAPRINE HCL 10 MG PO TABS: 10.0000 mg | ORAL_TABLET | Freq: Every evening | ORAL | Status: DC | PRN

## 2013-02-22 ENCOUNTER — Ambulatory Visit: Payer: 59 | Admitting: Sports Medicine

## 2013-04-06 ENCOUNTER — Encounter: Payer: Self-pay | Admitting: Internal Medicine

## 2013-06-09 ENCOUNTER — Encounter: Payer: Self-pay | Admitting: Internal Medicine

## 2013-06-13 ENCOUNTER — Encounter: Payer: Self-pay | Admitting: Sports Medicine

## 2013-06-13 ENCOUNTER — Ambulatory Visit (INDEPENDENT_AMBULATORY_CARE_PROVIDER_SITE_OTHER): Payer: 59 | Admitting: Sports Medicine

## 2013-06-13 VITALS — BP 116/79 | Ht 64.0 in | Wt 165.0 lb

## 2013-06-13 DIAGNOSIS — R209 Unspecified disturbances of skin sensation: Secondary | ICD-10-CM

## 2013-06-13 DIAGNOSIS — R202 Paresthesia of skin: Secondary | ICD-10-CM

## 2013-06-13 NOTE — Progress Notes (Signed)
   Subjective:    Patient ID: Kristina Flores, female    DOB: [birth year], 53 y.o.   MRN: [id number]  HPI chief complaint: I lateral lower leg "tingling"  Patient comes in today complaining of 4 weeks of bilateral lower extremity "tingling". She localizes her discomfort from the knee to the ankle. It encompasses the entire lower leg. It is not painful. She seems to notice it more with prolonged standing. Seems to improve some with activity. She denies any discomfort in her feet or ankles. No pain more proximally in her thighs or groin. No low back pain. No weakness. No recent trauma. She has a history of a grade 1 L4-L5 spondylolisthesis which was diagnosed via MRI in 2012. Mild central stenosis was noted at that time. Although her discomfort is noticeable she states that it is certainly tolerable.  Past medical history review Current medications reviewed. She takes when necessary meloxicam and when necessary Flexeril. She was recently prescribed Neurontin 100 mg each bedtime for some neuropathic sounding "itching" of both of her upper extremities which was prescribed to her by her dermatologist. Allergies reviewed    Review of Systems     Objective:   Physical Exam Well-developed, well-nourished. No acute distress. Awake alert and oriented x3. Vital signs reviewed to  Lumbar spine: Full painless lumbar range of motion. Neurological exam: Strength is 5/5 in both lower extremities. Reflexes are 2/4 at the Achilles and patellar tendons bilaterally. No atrophy. Sensation is intact to light touch grossly. Walking without a limp.       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 I recommended that she take her Neurontin nightly. She is on a very low dose and she can increase this to 2-3 pills  nightly if needed. I've asked her to try this for a week. I've also asked her to avoid any sort of exercise that involves hyperextension of the lumbar spine. Followup with me in 4 weeks. If symptoms worsen or she begins to develop weakness I may need to consider updated imaging of her lumbar spine. We could also consider increasing her dose of Neurontin. Call with questions or concerns prior to her followup visit.

## 2013-07-11 ENCOUNTER — Ambulatory Visit: Payer: 59 | Admitting: Sports Medicine

## 2013-07-27 ENCOUNTER — Encounter: Payer: Self-pay | Admitting: Sports Medicine

## 2013-07-27 ENCOUNTER — Ambulatory Visit (INDEPENDENT_AMBULATORY_CARE_PROVIDER_SITE_OTHER): Payer: 59 | Admitting: Sports Medicine

## 2013-07-27 VITALS — BP 125/77 | Ht 63.5 in | Wt 169.0 lb

## 2013-07-27 DIAGNOSIS — R202 Paresthesia of skin: Secondary | ICD-10-CM

## 2013-07-27 DIAGNOSIS — R209 Unspecified disturbances of skin sensation: Secondary | ICD-10-CM

## 2013-07-27 MED ORDER — GABAPENTIN 300 MG PO CAPS: 300.0000 mg | ORAL_CAPSULE | Freq: Every day | ORAL | Status: DC

## 2013-07-27 MED ORDER — CYCLOBENZAPRINE HCL 10 MG PO TABS: 10.0000 mg | ORAL_TABLET | Freq: Every evening | ORAL | Status: DC | PRN

## 2013-07-27 NOTE — Progress Notes (Signed)
  Kristina Flores - 53 y.o. female MRN [id number]  Date of birth: 1960-04-22    SUBJECTIVE:     Reports today for followup of her bilateral leg tingling.  Endorse "tingling "of her legs bilaterally from her knees to her ankles.  Occasionally she also experiences "pressure-like a blood pressure cuff on my leg"; rates this pain as 1/10.  She is not concerned about the pain as it does not affect her functioning, she is only curious as to what's causing it.  Sensation has been present since May and she denies any trauma/New medications in the time frame.  She denies any lower extremity weakness or as she has continued to do Pilates.  She denies any tingling or pressure sensation in either foot.  She increase her gabapentin to 200 mg, but has not noticed any improvement.  She thought her symptoms may be related to Zyrtec as she has had itching with this before; however, she has discontinued his medication has not noticed a change in her symptoms.   ROS:     No fevers, chills  PERTINENT  PMH / PSH FH / / SH:  Past Medical, Surgical, Social, and Family History Reviewed & Updated in the EMR.  Pertinent findings include:   None  OBJECTIVE: BP 125/77  Ht 5' 3.5" (1.613 m)  Wt 169 lb (76.658 kg)  BMI 29.46 kg/m2  Physical Exam:  Vital signs are reviewed.  Bilateral Legs Inspection: Swelling, or erythema Palpation: No knee, calf, ankle, tenderness Full range of motion in knees and ankles bilaterally Strength: 5/5 knee extension and flexion and ankle dorsiflexion and plantar flexion bilaterally Flex: Patellar and Achilles reflexes 2+ bilaterally Negative straight leg raise test bilaterally  ASSESSMENT & PLAN:  See problem based charting & AVS for pt instructions.

## 2013-07-27 NOTE — Assessment & Plan Note (Addendum)
Pertinent S&O  Persistent bilateral leg tingling Assessment/Plan  No immediate concern for neurovascular compromise; will hold on additional imaging studies  Increase gabapentin to 300 mg each bedtime  Refer to neurology

## 2013-08-05 ENCOUNTER — Ambulatory Visit (HOSPITAL_COMMUNITY)
Admission: RE | Admit: 2013-08-05 | Discharge: 2013-08-05 | Disposition: A | Discharge status: Home / Self Care | Payer: 59 | Source: Ambulatory Visit | Attending: Family Medicine | Admitting: Family Medicine

## 2013-08-05 ENCOUNTER — Encounter: Payer: Self-pay | Admitting: Family Medicine

## 2013-08-05 ENCOUNTER — Ambulatory Visit (INDEPENDENT_AMBULATORY_CARE_PROVIDER_SITE_OTHER): Payer: 59 | Admitting: Family Medicine

## 2013-08-05 VITALS — BP 157/89 | HR 62 | Ht 63.0 in | Wt 169.0 lb

## 2013-08-05 DIAGNOSIS — R209 Unspecified disturbances of skin sensation: Secondary | ICD-10-CM | POA: Insufficient documentation

## 2013-08-05 DIAGNOSIS — R930 Abnormal findings on diagnostic imaging of skull and head, not elsewhere classified: Secondary | ICD-10-CM | POA: Insufficient documentation

## 2013-08-05 DIAGNOSIS — R200 Anesthesia of skin: Secondary | ICD-10-CM

## 2013-08-05 MED ORDER — GADOBENATE DIMEGLUMINE 529 MG/ML IV SOLN
15.0000 mL | Freq: Once | INTRAVENOUS | Status: AC | PRN
  Administered 2013-08-05: 15 mL via INTRAVENOUS

## 2013-08-05 NOTE — Patient Instructions (Signed)
[facility] TODAY 08/05/13 AT [facility] AT 315P [phone]

## 2013-08-05 NOTE — Progress Notes (Signed)
Patient ID: Kristina Flores, female   DOB: 25-Dec-1960, 53 y.o.   MRN: [id number]  Kristina Flores - 53 y.o. female MRN [id number]  Date of birth: 1960/03/23    SUBJECTIVE:     Left-sided facial numbness started yesterday. No speech problems, no visual problems.  Was seen here a few weeks ago with unusual paresthesias bilateral legs from knee to ankle but not including foot. Feels like she has a "force field" around it or some type of blood pressure cuff. Has been there pretty much nonstop for the last few weeks.  The facial numbness just started yesterday. It started in the morning. In the afternoon she had this appointment and they did apparent is negative for any kind of dental issue. The facial numbness has continued overnight and into this morning. She call our office this morning with questions. ROS:     Over the last few months she's had occasional very mild word finding issues but no visual problems, no confusion, no agitation or behavioral issues. No muscle weakness. No fever, sweats, chills. Her family has not told her of any unusual behavior. Her last 2 years she has had isolated I lateral upper extremity itching in the shoulder to the wrist. Has seen dermatology and they thought it might be related to something in her neck. It is intermittent. It does not ever involve her hands truck or neck.  PERTINENT  PMH / PSH FH / / SH:  Past Medical, Surgical, Social, and Family History Reviewed & Updated in the EMR.  Pertinent findings include:  No personal history of diabetes mellitus, nonsmoker, no history of thyroid disorder.  OBJECTIVE: BP 157/89  Pulse 62  Ht 5\' 3"  (1.6 m)  Wt 169 lb (76.658 kg)  BMI 29.94 kg/m2  Physical Exam:  Vital signs are reviewed. GENERAL: Well-developed female no acute distress NEURO: Dinner she is a 12 without deficit. No gross focal motor deficit. Sensation to soft touch is intact and bilaterally symmetrical and face and lower strandy's. DTRs are 1-2+ bilaterally  equal at the knee and 1+ bilaterally equal at the ankle. NECK: Nontender to palpation. Full range of motion. HEENT: Pupils equal round reactive to light. No icterus. Discs appear flat. Normal vasculature on ophthalmoscopy exam. No nystagmus.  ASSESSMENT & PLAN:  See problem based charting & AVS for pt instructions. Unclear what this is. Most likely the paresthesias in the extremities are unrelated to the facial numbness. I think her obligated to rule out stroke and will get either emergent outpatient MRI/MRA of head and neck or send her to the emergency room. She really has a neurology appointment next week for evaluation of the lower extremity issues.

## 2013-08-06 ENCOUNTER — Ambulatory Visit (INDEPENDENT_AMBULATORY_CARE_PROVIDER_SITE_OTHER): Payer: 59 | Admitting: Family Medicine

## 2013-08-06 VITALS — BP 120/80 | HR 72 | Temp 98.2°F | Resp 12 | Ht 64.0 in | Wt 169.4 lb

## 2013-08-06 DIAGNOSIS — Z0000 Encounter for general adult medical examination without abnormal findings: Secondary | ICD-10-CM

## 2013-08-06 DIAGNOSIS — R209 Unspecified disturbances of skin sensation: Secondary | ICD-10-CM

## 2013-08-06 DIAGNOSIS — R202 Paresthesia of skin: Secondary | ICD-10-CM

## 2013-08-06 LAB — POCT URINALYSIS DIPSTICK
Bilirubin, UA: NEGATIVE
Blood, UA: NEGATIVE
Glucose, UA: NEGATIVE
Ketones, UA: NEGATIVE
Leukocytes, UA: NEGATIVE
Nitrite, UA: NEGATIVE
Protein, UA: NEGATIVE
Spec Grav, UA: <=1.005
Urobilinogen, UA: 0.2
pH, UA: 7

## 2013-08-06 LAB — COMPREHENSIVE METABOLIC PANEL
ALT: 14 U/L (ref 0–35)
AST: 21 U/L (ref 0–37)
Albumin: 4.2 g/dL (ref 3.5–5.2)
Alkaline Phosphatase: 51 U/L (ref 39–117)
BUN: 12 mg/dL (ref 6–23)
CO2: 28 mEq/L (ref 19–32)
Calcium: 9.7 mg/dL (ref 8.4–10.5)
Chloride: 103 mEq/L (ref 96–112)
Creat: 0.68 mg/dL (ref 0.50–1.10)
Glucose, Bld: 94 mg/dL (ref 70–99)
Potassium: 4.4 mEq/L (ref 3.5–5.3)
Sodium: 139 mEq/L (ref 135–145)
Total Bilirubin: 0.5 mg/dL (ref 0.2–1.2)
Total Protein: 7 g/dL (ref 6.0–8.3)

## 2013-08-06 LAB — POCT CBC
Granulocyte percent: 66.1 %G (ref 37–80)
HCT, POC: 40.9 % (ref 37.7–47.9)
Hemoglobin: 14 g/dL (ref 12.2–16.2)
Lymph, poc: 1.4 (ref 0.6–3.4)
MCH, POC: 31 pg (ref 27–31.2)
MCHC: 34.2 g/dL (ref 31.8–35.4)
MCV: 90.6 fL (ref 80–97)
MID (cbc): 0.3 (ref 0–0.9)
MPV: 8.2 fL (ref 0–99.8)
POC Granulocyte: 3.4 (ref 2–6.9)
POC LYMPH PERCENT: 27.5 %L (ref 10–50)
POC MID %: 6.4 %M (ref 0–12)
Platelet Count, POC: 231 10*3/uL (ref 142–424)
RBC: 4.52 M/uL (ref 4.04–5.48)
RDW, POC: 13.5 %
WBC: 5.1 10*3/uL (ref 4.6–10.2)

## 2013-08-06 LAB — TSH: TSH: 2.345 u[IU]/mL (ref 0.350–4.500)

## 2013-08-06 LAB — LIPID PANEL
Cholesterol: 167 mg/dL (ref 0–200)
HDL: 88 mg/dL (ref >39)
LDL Cholesterol: 66 mg/dL (ref 0–99)
Total CHOL/HDL Ratio: 1.9 Ratio
Triglycerides: 67 mg/dL (ref <150)
VLDL: 13 mg/dL (ref 0–40)

## 2013-08-06 LAB — VITAMIN B12: Vitamin B-12: 594 pg/mL (ref 211–911)

## 2013-08-06 NOTE — Progress Notes (Signed)
Subjective:    Patient ID: Kristina Flores, female    DOB: 02/17/1960, 53 y.o.   MRN: [id number] This chart was scribed for Robyn Haber, MD by Girtha Hake, ED Scribe. The patient was seen in Room 12. The patient's care was started at 11:16 AM.   HPI HPI Comments: Kristina Flores is a 53 y.o. female who presents for a general work-up, including blood work. She reports constant, progressively worsening tingling in both legs radiating from her knees to her ankles. She states that this sensation began two months ago. Patient reports an episode of similar tingling in the left side of her face two days ago. She was seen yesterday and had an MRI that came back clear. She reports an associated feeling of "pressure" in her legs. Patient also has a history of "nerve itching" in both arms. She reports that she takes gabapentin PRN to minimize this itching. Patient denies weakness.  Patient works as a Print production planner. Patient visits a gynecologist annually and has annual colonoscopies. PCP is Dr. Leward Quan.   Review of Systems  Neurological: Negative for weakness.       Patient reports "tingling" in both legs radiating from her knees to her ankles.       Objective:   Physical Exam  Nursing note and vitals reviewed. Constitutional: She is oriented to person, place, and time. She appears well-developed and well-nourished. No distress.  HENT:  Head: Normocephalic and atraumatic.  Eyes: Conjunctivae and EOM are normal.  Neck: Neck supple. No tracheal deviation present.  Cardiovascular: Normal rate.   Pulmonary/Chest: Effort normal. No respiratory distress.  Musculoskeletal: Normal range of motion.  Neurological: She is alert and oriented to person, place, and time.  Skin: Skin is warm and dry.  Psychiatric: She has a normal mood and affect. Her behavior is normal.   No abnormalities of years, oropharynx, or eyes Chest is clear to auscultation Heart: No murmur gallop or rub Skin shows some  excoriations on the forearms but otherwise is normal. Cardiovascular: Normal distal pulses no edema Neurologically patient has normal cranial nerves and full motor symmetry. CLINICAL DATA: Left-sided facial numbness. Evaluate for stroke.  EXAM:  MRI HEAD WITH CONTRAST  MRA NECK WITHOUT AND WITH CONTRAST  TECHNIQUE:  Multiplanar, multiecho pulse sequences of the brain and surrounding  structures were obtained with intravenous contrast. Angiographic  images of the neck were obtained using MRA technique with and  without intravenous contrast. Carotid stenosis measurements (when  applicable) are obtained utilizing NASCET criteria, using the distal  internal carotid diameter as the denominator.  CONTRAST: 34mL MULTIHANCE GADOBENATE DIMEGLUMINE 529 MG/ML IV SOLN  COMPARISON: None.  FINDINGS:  MRI HEAD FINDINGS  There is no evidence of acute infarct, intracranial hemorrhage,  mass, midline shift, or extra-axial fluid collection. There is a  single 5 mm focus of T2 hyperintensity in the white matter of the  left frontal lobe near the operculum, nonspecific and not unexpected  for patient's age. The ventricles and sulci are within normal limits  for age.  Orbits are unremarkable. Paranasal sinuses and mastoid air cells are  clear. Major intracranial vascular flow voids are preserved.  MRA NECK FINDINGDS  Three vessel aortic arch. Subclavian arteries are unremarkable.  Common carotid and cervical internal carotid arteries are patent  without evidence of stenosis, dissection, or aneurysm. Antegrade  flow is present in the vertebral arteries, which are codominant  without stenosis.  IMPRESSION:  1. Unremarkable appearance of the brain for age.  No evidence of  acute intracranial abnormality.  2. Unremarkable neck MRA.   CLINICAL DATA: Left-sided facial numbness. Evaluate for stroke.  EXAM:  MRI HEAD WITH CONTRAST  MRA NECK WITHOUT AND WITH CONTRAST  TECHNIQUE:  Multiplanar, multiecho  pulse sequences of the brain and surrounding  structures were obtained with intravenous contrast. Angiographic  images of the neck were obtained using MRA technique with and  without intravenous contrast. Carotid stenosis measurements (when  applicable) are obtained utilizing NASCET criteria, using the distal  internal carotid diameter as the denominator.  CONTRAST: 81mL MULTIHANCE GADOBENATE DIMEGLUMINE 529 MG/ML IV SOLN  COMPARISON: None.  FINDINGS:  MRI HEAD FINDINGS  There is no evidence of acute infarct, intracranial hemorrhage,  mass, midline shift, or extra-axial fluid collection. There is a  single 5 mm focus of T2 hyperintensity in the white matter of the  left frontal lobe near the operculum, nonspecific and not unexpected  for patient's age. The ventricles and sulci are within normal limits  for age.  Orbits are unremarkable. Paranasal sinuses and mastoid air cells are  clear. Major intracranial vascular flow voids are preserved.  MRA NECK FINDINGDS  Three vessel aortic arch. Subclavian arteries are unremarkable.  Common carotid and cervical internal carotid arteries are patent  without evidence of stenosis, dissection, or aneurysm. Antegrade  flow is present in the vertebral arteries, which are codominant  without stenosis.  IMPRESSION:  1. Unremarkable appearance of the brain for age. No evidence of  acute intracranial abnormality.  2. Unremarkable neck MRA.  Electronically Signed  By: Logan Bores  On: 08/05/2013 19:47   Results for orders placed in visit on 05/22/12  POCT SKIN KOH      Result Value Ref Range   Skin KOH, POC Negative          Assessment & Plan:   This chart was scribed in my presence and reviewed by me personally. 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  She seems to have an adequate  workup as far structural lesions of the central nervous system with an MRI and an MRA of her head. Nevertheless his symptoms are pretty typical for metabolic problem such as E52 deficiency or thyroid abnormality. Therefore go ahead and check these areas and proceed with the neurology appointment which has already been scheduled for the middle of the week.  I think is pretty safe the patient go ahead and travel to [place] in the next week.  Routine general medical examination at a health care facility - Plan: POCT CBC, POCT urinalysis dipstick, POCT SEDIMENTATION RATE, TSH, Comprehensive metabolic panel, Lipid panel, Vit D  25 hydroxy (rtn osteoporosis monitoring), Vitamin B12  Signed, Robyn Haber, MD

## 2013-08-06 NOTE — Patient Instructions (Addendum)
CLINICAL DATA: Left-sided facial numbness. Evaluate for stroke.  EXAM:  MRI HEAD WITH CONTRAST  MRA NECK WITHOUT AND WITH CONTRAST  TECHNIQUE:  Multiplanar, multiecho pulse sequences of the brain and surrounding  structures were obtained with intravenous contrast. Angiographic  images of the neck were obtained using MRA technique with and  without intravenous contrast. Carotid stenosis measurements (when  applicable) are obtained utilizing NASCET criteria, using the distal  internal carotid diameter as the denominator.  CONTRAST: 54mL MULTIHANCE GADOBENATE DIMEGLUMINE 529 MG/ML IV SOLN  COMPARISON: None.  FINDINGS:  MRI HEAD FINDINGS  There is no evidence of acute infarct, intracranial hemorrhage,  mass, midline shift, or extra-axial fluid collection. There is a  single 5 mm focus of T2 hyperintensity in the white matter of the  left frontal lobe near the operculum, nonspecific and not unexpected  for patient's age. The ventricles and sulci are within normal limits  for age.  Orbits are unremarkable. Paranasal sinuses and mastoid air cells are  clear. Major intracranial vascular flow voids are preserved.  MRA NECK FINDINGDS  Three vessel aortic arch. Subclavian arteries are unremarkable.  Common carotid and cervical internal carotid arteries are patent  without evidence of stenosis, dissection, or aneurysm. Antegrade  flow is present in the vertebral arteries, which are codominant  without stenosis.  IMPRESSION:  1. Unremarkable appearance of the brain for age. No evidence of  acute intracranial abnormality.  2. Unremarkable neck MRA.   CLINICAL DATA: Left-sided facial numbness. Evaluate for stroke.  EXAM:  MRI HEAD WITH CONTRAST  MRA NECK WITHOUT AND WITH CONTRAST  TECHNIQUE:  Multiplanar, multiecho pulse sequences of the brain and surrounding  structures were obtained with intravenous contrast. Angiographic  images of the neck were obtained using MRA technique with and   without intravenous contrast. Carotid stenosis measurements (when  applicable) are obtained utilizing NASCET criteria, using the distal  internal carotid diameter as the denominator.  CONTRAST: 78mL MULTIHANCE GADOBENATE DIMEGLUMINE 529 MG/ML IV SOLN  COMPARISON: None.  FINDINGS:  MRI HEAD FINDINGS  There is no evidence of acute infarct, intracranial hemorrhage,  mass, midline shift, or extra-axial fluid collection. There is a  single 5 mm focus of T2 hyperintensity in the white matter of the  left frontal lobe near the operculum, nonspecific and not unexpected  for patient's age. The ventricles and sulci are within normal limits  for age.  Orbits are unremarkable. Paranasal sinuses and mastoid air cells are  clear. Major intracranial vascular flow voids are preserved.  MRA NECK FINDINGDS  Three vessel aortic arch. Subclavian arteries are unremarkable.  Common carotid and cervical internal carotid arteries are patent  without evidence of stenosis, dissection, or aneurysm. Antegrade  flow is present in the vertebral arteries, which are codominant  without stenosis.  IMPRESSION:  1. Unremarkable appearance of the brain for age. No evidence of  acute intracranial abnormality.  2. Unremarkable neck MRA.  Electronically Signed  By: Logan Bores  On: 08/05/2013 19:47  Paresthesia Paresthesia is an abnormal burning or prickling sensation. This sensation is generally felt in the hands, arms, legs, or feet. However, it may occur in any part of the body. It is usually not painful. The feeling may be described as:  Tingling or numbness.  "Pins and needles."  Skin crawling.  Buzzing.  Limbs "falling asleep."  Itching. Most people experience temporary (transient) paresthesia at some time in their lives. CAUSES  Paresthesia may occur when you breathe too quickly (hyperventilation). It can also occur without  any apparent cause. Commonly, paresthesia occurs when pressure is placed on a  nerve. The feeling quickly goes away once the pressure is removed. For some people, however, paresthesia is a long-lasting (chronic) condition caused by an underlying disorder. The underlying disorder may be:  A traumatic, direct injury to nerves. Examples include a:  Broken (fractured) neck.  Fractured skull.  A disorder affecting the brain and spinal cord (central nervous system). Examples include:  Transverse myelitis.  Encephalitis.  Transient ischemic attack.  Multiple sclerosis.  Stroke.  Tumor or blood vessel problems, such as an arteriovenous malformation pressing against the brain or spinal cord.  A condition that damages the peripheral nerves (peripheral neuropathy). Peripheral nerves are not part of the brain and spinal cord. These conditions include:  Diabetes.  Peripheral vascular disease.  Nerve entrapment syndromes, such as carpal tunnel syndrome.  Shingles.  Hypothyroidism.  Vitamin B12 deficiencies.  Alcoholism.  Heavy metal poisoning (lead, arsenic).  Rheumatoid arthritis.  Systemic lupus erythematosus. DIAGNOSIS  Your caregiver will attempt to find the underlying cause of your paresthesia. Your caregiver may:  Take your medical history.  Perform a physical exam.  Order various lab tests.  Order imaging tests. TREATMENT  Treatment for paresthesia depends on the underlying cause. HOME CARE INSTRUCTIONS  Avoid drinking alcohol.  You may consider massage or acupuncture to help relieve your symptoms.  Keep all follow-up appointments as directed by your caregiver. SEEK IMMEDIATE MEDICAL CARE IF:   You feel weak.  You have trouble walking or moving.  You have problems with speech or vision.  You feel confused.  You cannot control your bladder or bowel movements.  You feel numbness after an injury.  You faint.  Your burning or prickling feeling gets worse when walking.  You have pain, cramps, or dizziness.  You develop a  rash. MAKE SURE YOU:  Understand these instructions.  Will watch your condition.  Will get help right away if you are not doing well or get worse. Document Released: 12/20/2001 Document Revised: 03/24/2011 Document Reviewed: 09/20/2010 [facility] Patient Information 2015 [place], [place]. This information is not intended to replace advice given to you by your health care provider. Make sure you discuss any questions you have with your health care provider.

## 2013-08-07 ENCOUNTER — Telehealth: Payer: Self-pay | Admitting: Radiology

## 2013-08-07 NOTE — Telephone Encounter (Signed)
Dr L--  There was no lavender top sent to [facility] for this pt's sed rate. Did you want her to come in so we can redraw?

## 2013-08-07 NOTE — Telephone Encounter (Signed)
Please ask patient to return if symptoms persist.

## 2013-08-08 LAB — VITAMIN D 25 HYDROXY (VIT D DEFICIENCY, FRACTURES): Vit D, 25-Hydroxy: 55 ng/mL (ref 30–89)

## 2013-08-09 ENCOUNTER — Ambulatory Visit (AMBULATORY_SURGERY_CENTER): Payer: Self-pay | Admitting: *Deleted

## 2013-08-09 VITALS — Ht 64.0 in | Wt 171.0 lb

## 2013-08-09 DIAGNOSIS — Z800 Family history of malignant neoplasm of digestive organs: Secondary | ICD-10-CM

## 2013-08-09 MED ORDER — MOVIPREP 100 G PO SOLR: 1.0000 | Freq: Once | ORAL | Status: DC

## 2013-08-09 NOTE — Progress Notes (Signed)
No egg or soy allergy. No anesthesia problems.  No home O2.  No diet meds.  

## 2013-08-10 ENCOUNTER — Ambulatory Visit (INDEPENDENT_AMBULATORY_CARE_PROVIDER_SITE_OTHER): Payer: 59 | Admitting: Neurology

## 2013-08-10 ENCOUNTER — Encounter: Payer: Self-pay | Admitting: Neurology

## 2013-08-10 VITALS — BP 123/78 | HR 76 | Resp 16 | Ht 64.25 in | Wt 171.0 lb

## 2013-08-10 DIAGNOSIS — R209 Unspecified disturbances of skin sensation: Secondary | ICD-10-CM

## 2013-08-10 DIAGNOSIS — R208 Other disturbances of skin sensation: Secondary | ICD-10-CM | POA: Insufficient documentation

## 2013-08-10 NOTE — Patient Instructions (Signed)
Will check ANA, sed rate and c reactive protein for autoimmune and vasculitic processes.  Will check neck spine MRI in August.

## 2013-08-10 NOTE — Progress Notes (Signed)
[facility]  Provider:  Larey Seat, M D  Referring Provider: Barton Fanny, MD Primary Care Physician:  Ellsworth Lennox, MD  Chief Complaint  Patient presents with  . New Evaluation    Room 10  . Tingling in extremities    HPI:  Kristina Flores is a 53 y.o., right handed  female , who is seen here upon  referral  from [facility] for evaluation of dysaesthesias.  The patient reports her legs began tingling in May, a feeling as if a charge arises from the knee to the outside of the lower extremities and ends at the ankle level. She noted no loss of strength, and she is doing 3 times weekly pilates and she bikes. The sensation has been symmetric.  She  Was prescribed gabapentin , but noted no effect on 100 mg , now increased 300 mg at night. Still , no effect was noted.  The feeling was like an Geophysicist/field seismologist , a little tingling.  For 5 and a half years she had already some itching on her left arm, not the same quality of sensation and here asymmetric.  Not extending over th wrist and not into the neck area, this covers mostly the outside, sun exposed part of the left arm. It gets worse in the sun.   Last Thursday, 08-05-13 , she had suddenly a left sided facial numbness, beginning at 10 . 30 in AM and lasting until Saturday morning, when it resolved. Yesterday , it came back again.  An MRI of the brain was performed and all " normal ", MRA the same.   The patient had a long lists of laboratory results , TSH, CBC and diff, Vit D and CMET- all normal.  No history of diabetes, thyroid disease. No autoimmune diseases in her or her families history.  Colonoscopy recently was normal.   She is leaving for the [place] tomorrow.    Review of Systems: Out of a complete 14 system review, the patient complains of only the following symptoms, and all other reviewed systems are negative. Tingling numbness,   History   Social History  . Marital Status: Married     Spouse Name: N/A    Number of Children: N/A  . Years of Education: N/A   Occupational History  . Not on file.   Social History Main Topics  . Smoking status: Never Smoker   . Smokeless tobacco: Never Used  . Alcohol Use: Yes  . Drug Use: No  . Sexual Activity: Not on file   Other Topics Concern  . Not on file   Social History Narrative  . No narrative on file    Family History  Problem Relation Age of Onset  . Colon cancer Father 49    Past Medical History  Diagnosis Date  . Allergy   . Heart murmur     Past Surgical History  Procedure Laterality Date  . Miscarriage 1989    . Ovarian cyst 1992    . Fibroid in 2011,2003      Current Outpatient Prescriptions  Medication Sig Dispense Refill  . BLACK COHOSH EXTRACT PO Take by mouth.      . Calcium Carb-Cholecalciferol (CALCIUM 600 + D PO) Take 1 tablet by mouth daily.      . Cholecalciferol 1000 UNITS capsule Take 1,000 Units by mouth daily.      . cyclobenzaprine (FLEXERIL) 10 MG tablet Take 1 tablet (10 mg total) by mouth at bedtime as  needed for muscle spasms.  30 tablet  0  . gabapentin (NEURONTIN) 100 MG capsule Take 100 mg by mouth as needed.      . meloxicam (MOBIC) 15 MG tablet Take 15 mg by mouth as needed.      Marland Kitchen MOVIPREP 100 G SOLR Take 1 kit (200 g total) by mouth once. Name brand only, movi prep as directed, no substitutions.  1 kit  0  . Multiple Vitamin (MULTIVITAMIN) tablet Take 1 tablet by mouth daily.      . Omega-3 Fatty Acids (FISH OIL) 1000 MG CAPS Take 1 capsule by mouth daily.       No current facility-administered medications for this visit.    Allergies as of 08/10/2013  . (No Known Allergies)    Vitals: There were no vitals taken for this visit. Last Weight:  Wt Readings from Last 1 Encounters:  08/09/13 171 lb (77.565 kg)   Last Height:   Ht Readings from Last 1 Encounters:  08/09/13 5' 4" (1.626 m)    Physical exam:  General: The patient is awake, alert and appears not  in acute distress. The patient is well groomed. Head: Normocephalic, atraumatic. Neck is supple. Mallampati  2 , neck circumference:  15 inches.  Cardiovascular:  Regular rate and rhythm , without  murmurs or carotid bruit, and without distended neck veins. Respiratory: Lungs are clear to auscultation. Skin:  Without evidence of edema, or rash Trunk: BMI is elevated , the patient  has normal posture.  Neurologic exam : The patient is awake and alert, oriented to place and time.  Memory subjective  described as intact.  There is a normal attention span & concentration ability. Speech is fluent without  dysarthria, dysphonia or aphasia. Mood and affect are appropriate.  Cranial nerves: Pupils are equal and briskly reactive to light. Funduscopic exam without  evidence of pallor or edema. Extraocular movements  in vertical and horizontal planes intact and without nystagmus. Visual fields by finger perimetry are intact. Hearing to finger rub intact. Facial sensation :decreased sensation to fine filament touch on the left face, branches 2 and 3 of the trigeminal nerve . Facial motor strength is symmetric and tongue and uvula move midline.  Motor exam:  Normal tone and normal muscle bulk and symmetric normal strength in all extremities. Good bilateral grip strength.   Sensory:  Fine touch, pinprick and vibration were tested in all extremities. Proprioception is tested in the upper extremities only. This was normal.  Coordination: Rapid alternating movements in the fingers/hands is tested and normal.  Finger-to-nose maneuver tested and normal without evidence of ataxia, dysmetria or tremor.  Gait and station: Patient walks without assistive device . Strength within normal limits. Stance is stable and normal. Tandem gait is unfragmented.  Heel and toe walking intact. Romberg testing is normal.  Deep tendon reflexes: in the  upper and lower extremities are symmetric  2/2 and intact. Babinski  maneuver response is downgoing.   Assessment:  After physical and neurologic examination, review of laboratory studies, imaging, neurophysiology testing and pre-existing records, assessment is   1) multifocal dysesthesias. Normal MRI of  brain, with contrast and MRA , neck all normal.  2) Neurontin not responding.   Plan:  Treatment plan and additional workup : 3) will add CRP and sed rate,  ANA , RF MRI of the cervical spine in 3-4 weeks. After her August vacation.  4) no dry eyes or dry mouth- unlikely [place]. Will Rv in August.  5) Start calcium supplement ( Tumms ) to help decrease the skin sensitivity on the right arm, sun blocker, long sleeves.

## 2013-08-11 LAB — C-REACTIVE PROTEIN: CRP: 1 mg/L (ref 0.0–4.9)

## 2013-08-11 LAB — SEDIMENTATION RATE: Sed Rate: 2 mm/hr (ref 0–40)

## 2013-08-11 LAB — ANA W/REFLEX IF POSITIVE: Anti Nuclear Antibody(ANA): NEGATIVE

## 2013-08-12 ENCOUNTER — Ambulatory Visit (HOSPITAL_COMMUNITY): Payer: 59

## 2013-08-31 ENCOUNTER — Ambulatory Visit (AMBULATORY_SURGERY_CENTER): Payer: 59 | Admitting: Internal Medicine

## 2013-08-31 ENCOUNTER — Encounter: Payer: Self-pay | Admitting: Internal Medicine

## 2013-08-31 VITALS — BP 127/68 | HR 50 | Temp 97.5°F | Resp 16 | Ht 64.0 in | Wt 171.0 lb

## 2013-08-31 DIAGNOSIS — D126 Benign neoplasm of colon, unspecified: Secondary | ICD-10-CM

## 2013-08-31 DIAGNOSIS — Z1211 Encounter for screening for malignant neoplasm of colon: Secondary | ICD-10-CM

## 2013-08-31 DIAGNOSIS — D128 Benign neoplasm of rectum: Secondary | ICD-10-CM

## 2013-08-31 DIAGNOSIS — Z800 Family history of malignant neoplasm of digestive organs: Secondary | ICD-10-CM

## 2013-08-31 DIAGNOSIS — D129 Benign neoplasm of anus and anal canal: Secondary | ICD-10-CM

## 2013-08-31 MED ORDER — SODIUM CHLORIDE 0.9 % IV SOLN: 500.0000 mL | INTRAVENOUS | Status: DC

## 2013-08-31 NOTE — Progress Notes (Signed)
Report to PACU, RN, vss, BBS= Clear.  

## 2013-08-31 NOTE — Patient Instructions (Signed)
YOU HAD AN ENDOSCOPIC PROCEDURE TODAY AT THE [facility]: Refer to the procedure report that was given to you for any specific questions about what was found during the examination.  If the procedure report does not answer your questions, please call your gastroenterologist to clarify.  If you requested that your care partner not be given the details of your procedure findings, then the procedure report has been included in a sealed envelope for you to review at your convenience later.  YOU SHOULD EXPECT: Some feelings of bloating in the abdomen. Passage of more gas than usual.  Walking can help get rid of the air that was put into your GI tract during the procedure and reduce the bloating. If you had a lower endoscopy (such as a colonoscopy or flexible sigmoidoscopy) you may notice spotting of blood in your stool or on the toilet paper. If you underwent a bowel prep for your procedure, then you may not have a normal bowel movement for a few days.  DIET: Your first meal following the procedure should be a light meal and then it is ok to progress to your normal diet.  A half-sandwich or bowl of soup is an example of a good first meal.  Heavy or fried foods are harder to digest and may make you feel nauseous or bloated.  Likewise meals heavy in dairy and vegetables can cause extra gas to form and this can also increase the bloating.  Drink plenty of fluids but you should avoid alcoholic beverages for 24 hours.  ACTIVITY: Your care partner should take you home directly after the procedure.  You should plan to take it easy, moving slowly for the rest of the day.  You can resume normal activity the day after the procedure however you should NOT DRIVE or use heavy machinery for 24 hours (because of the sedation medicines used during the test).    SYMPTOMS TO REPORT IMMEDIATELY: A gastroenterologist can be reached at any hour.  During normal business hours, 8:30 AM to 5:00 PM Monday through Friday,  call [phone].  After hours and on weekends, please call the GI answering service at [phone] who will take a message and have the physician on call contact you.   Following lower endoscopy (colonoscopy or flexible sigmoidoscopy):  Excessive amounts of blood in the stool  Significant tenderness or worsening of abdominal pains  Swelling of the abdomen that is new, acute  Fever of 100F or higher   FOLLOW UP: If any biopsies were taken you will be contacted by phone or by letter within the next 1-3 weeks.  Call your gastroenterologist if you have not heard about the biopsies in 3 weeks.  Our staff will call the home number listed on your records the next business day following your procedure to check on you and address any questions or concerns that you may have at that time regarding the information given to you following your procedure. This is a courtesy call and so if there is no answer at the home number and we have not heard from you through the emergency physician on call, we will assume that you have returned to your regular daily activities without incident.  SIGNATURES/CONFIDENTIALITY: You and/or your care partner have signed paperwork which will be entered into your electronic medical record.  These signatures attest to the fact that that the information above on your After Visit Summary has been reviewed and is understood.  Full responsibility of the confidentiality of   this discharge information lies with you and/or your care-partner.   Resume medications. Information given on polyps,diverticulosis and high fiber diet with discharge instructions. 

## 2013-08-31 NOTE — Op Note (Signed)
[place]  [employer] [place], [id number]   COLONOSCOPY PROCEDURE REPORT  PATIENT: Kristina Flores, Kristina Flores  MR#: [id number] BIRTHDATE: 1960-05-01 , 59  yrs. old GENDER: Female ENDOSCOPIST: Lafayette Dragon, MD REFERRED MQ:KMMNOTR McPherson, M.D. ,Dr Edwinna Areola PROCEDURE DATE:  08/31/2013 PROCEDURE:   Colonoscopy with cold biopsy polypectomy First Screening Colonoscopy - Avg.  risk and is 50 yrs.  old or older - No.  Prior Negative Screening - Now for repeat screening. N/A Prior Negative Screening - Now for repeat screening.  Above average risk  History of Adenoma - Now for follow-up colonoscopy  has been > or = to 3 yrs.  N/A  Polyps Removed Today? Yes. ASA CLASS:   Class II INDICATIONS:father with colon cancer.  Prior colonoscopy in June 2005 8 in June 2010. MEDICATIONS: MAC sedation, administered by CRNA and propofol (Diprivan) 250mg  IV  DESCRIPTION OF PROCEDURE:   After the risks benefits and alternatives of the procedure were thoroughly explained, informed consent was obtained.  A digital rectal exam revealed no abnormalities of the rectum.   The LB [id number]  endoscope was introduced through the anus and advanced to the cecum, which was identified by both the appendix and ileocecal valve. No adverse events experienced.   The quality of the prep was good, using MoviPrep  The instrument was then slowly withdrawn as the colon was fully examined.      COLON FINDINGS: A sessile polyp ranging between 3-53mm in size was found in the sigmoid colon.  A polypectomy was performed with cold forceps.  The resection was complete and the polyp tissue was completely retrieved.   Mild diverticulosis was noted in the sigmoid colon.  Retroflexed views revealed no abnormalities. The time to cecum=5 minutes 04 seconds.  Withdrawal time=8 minutes 07 seconds.  The scope was withdrawn and the procedure completed. COMPLICATIONS: There were no complications.  ENDOSCOPIC  IMPRESSION: 1.   Sessile polyp ranging between 3-73mm in size was found in the sigmoid colon; polypectomy was performed with cold forceps 2.   Mild diverticulosis was noted in the sigmoid colon  RECOMMENDATIONS: 1.  Await pathology results 2.  high fiber diet Recall colonoscopy in 5 years   eSigned:  Lafayette Dragon, MD 08/31/2013 10:12 AM   cc:   PATIENT NAME:  [name], Purpura MR#: [id number]

## 2013-08-31 NOTE — Progress Notes (Signed)
Called to room to assist during endoscopic procedure.  Patient ID and intended procedure confirmed with present staff. Received instructions for my participation in the procedure from the performing physician.  

## 2013-09-01 ENCOUNTER — Telehealth: Payer: Self-pay

## 2013-09-01 ENCOUNTER — Ambulatory Visit (INDEPENDENT_AMBULATORY_CARE_PROVIDER_SITE_OTHER): Payer: 59

## 2013-09-01 DIAGNOSIS — R209 Unspecified disturbances of skin sensation: Secondary | ICD-10-CM

## 2013-09-01 DIAGNOSIS — R208 Other disturbances of skin sensation: Secondary | ICD-10-CM

## 2013-09-01 MED ORDER — GADOPENTETATE DIMEGLUMINE 469.01 MG/ML IV SOLN: 16.0000 mL | Freq: Once | INTRAVENOUS | Status: AC | PRN

## 2013-09-01 NOTE — Telephone Encounter (Signed)
  Follow up Call-  Call back number 08/31/2013  Post procedure Call Back phone  # [phone]  Permission to leave phone message Yes     Patient questions:  Do you have a fever, pain , or abdominal swelling? No. Pain Score  0 *  Have you tolerated food without any problems? Yes.    Have you been able to return to your normal activities? Yes.    Do you have any questions about your discharge instructions: Diet   No. Medications  No. Follow up visit  No.  Do you have questions or concerns about your Care? No.  Actions: * If pain score is 4 or above: No action needed, pain <4.

## 2013-09-06 ENCOUNTER — Encounter: Payer: Self-pay | Admitting: Internal Medicine

## 2013-09-08 ENCOUNTER — Encounter: Payer: Self-pay | Admitting: Adult Health

## 2013-09-08 ENCOUNTER — Ambulatory Visit (INDEPENDENT_AMBULATORY_CARE_PROVIDER_SITE_OTHER): Payer: 59 | Admitting: Adult Health

## 2013-09-08 ENCOUNTER — Encounter: Payer: Self-pay | Admitting: *Deleted

## 2013-09-08 VITALS — BP 124/82 | HR 67 | Ht 64.0 in | Wt 174.0 lb

## 2013-09-08 DIAGNOSIS — R209 Unspecified disturbances of skin sensation: Secondary | ICD-10-CM | POA: Insufficient documentation

## 2013-09-08 NOTE — Progress Notes (Signed)
PATIENT: Kristina Flores DOB: March 10, 1960  REASON FOR VISIT: follow up HISTORY FROM: patient  HISTORY OF PRESENT ILLNESS: [name] is a 53 year old female with a history of sensory changes. She returns today for follow-up.  She states that the itching in her arms have continued. She went to the dermatologist who said it was neck related. She has electric "charge" from the knee down to the feet. Sometimes it feels like "pressure" around the lower leg. Denies discomfort or pain. She uses gabapentin and it helps with the itching but not the tingling she has felt. The tingling in the legs is not painful and is intermittent. She has felt the numbness on the left side of the face but not has severe as before. Denies changes in gait. Denies changes with bowels or bladder. No issues with speech or swallowing. Denies weakness. No new neurological complaints since last seen.   HISTORY 08/10/13 (CD): The patient reports her legs began tingling in May, a feeling as if a charge arises from the knee to the outside of the lower extremities and ends at the ankle level. She noted no loss of strength, and she is doing 3 times weekly pilates and she bikes. The sensation has been symmetric. She Was prescribed gabapentin , but noted no effect on 100 mg , now increased 300 mg at night. Still , no effect was noted.  The feeling was like an Geophysicist/field seismologist , a little tingling.  For 5 and a half years she had already some itching on her left arm, not the same quality of sensation and here asymmetric. Not extending over th wrist and not into the neck area, this covers mostly the outside, sun exposed part of the left arm. It gets worse in the sun.  Last Thursday, 08-05-13 , she had suddenly a left sided facial numbness, beginning at 10 . 30 in AM and lasting until Saturday morning, when it resolved. Yesterday , it came back again.  An MRI of the brain was performed and all " normal ", MRA the same.  The patient had a long lists  of laboratory results , TSH, CBC and diff, Vit D and CMET- all normal.  No history of diabetes, thyroid disease. No autoimmune diseases in her or her families history. Colonoscopy recently was normal.  She is leaving for the [place] tomorrow.    REVIEW OF SYSTEMS: Full 14 system review of systems performed and notable only for:  Constitutional: N/A  Eyes: N/A Ear/Nose/Throat: N/A  Skin: itching Cardiovascular: N/A  Respiratory: N/A  Gastrointestinal: N/A  Genitourinary: N/A Hematology/Lymphatic: N/A  Endocrine: N/A Musculoskeletal: neck pain and stiffness Allergy/Immunology: N/A  Neurological: numbness, tingling Psychiatric: N/A Sleep: N/A   ALLERGIES: No Known Allergies  HOME MEDICATIONS: Outpatient Prescriptions Prior to Visit  Medication Sig Dispense Refill  . BLACK COHOSH EXTRACT PO Take by mouth.      . Calcium Carb-Cholecalciferol (CALCIUM 600 + D PO) Take 1 tablet by mouth daily.      . Cholecalciferol 1000 UNITS capsule Take 1,000 Units by mouth daily.      . cyclobenzaprine (FLEXERIL) 10 MG tablet Take 1 tablet (10 mg total) by mouth at bedtime as needed for muscle spasms.  30 tablet  0  . gabapentin (NEURONTIN) 100 MG capsule Take 100 mg by mouth as needed.      . meloxicam (MOBIC) 15 MG tablet Take 15 mg by mouth as needed.      . Multiple Vitamin (  MULTIVITAMIN) tablet Take 1 tablet by mouth daily.      . Omega-3 Fatty Acids (FISH OIL) 1000 MG CAPS Take 1 capsule by mouth daily.       No facility-administered medications prior to visit.    PAST MEDICAL HISTORY: Past Medical History  Diagnosis Date  . Allergy   . Heart murmur   . Numbness and tingling     PAST SURGICAL HISTORY: Past Surgical History  Procedure Laterality Date  . Miscarriage 1989    . Ovarian cyst 1992    . Fibroid in 2011,2003      FAMILY HISTORY: Family History  Problem Relation Age of Onset  . Colon cancer Father 66    SOCIAL HISTORY: History   Social History  . Marital  Status: Married    Spouse Name: Kristina Flores    Number of Children: 3  . Years of Education: College   Occupational History  . PRESCHOOL TEACHER    Social History Main Topics  . Smoking status: Never Smoker   . Smokeless tobacco: Never Used  . Alcohol Use: Yes  . Drug Use: No  . Sexual Activity: Not on file   Other Topics Concern  . Not on file   Social History Narrative   Patient is married Kristina Flores) and lives at home with her husband.   Patient has three adult children.   Patient is a Pharmacist, hospital, working part-time.   Patient has a college education.   Patient is right-handed.   Patient drinks one cup of coffee daily.      PHYSICAL EXAM  Filed Vitals:   09/08/13 1402  BP: 124/82  Pulse: 67  Height: 5\' 4"  (1.626 m)  Weight: 174 lb (78.926 kg)   Body mass index is 29.85 kg/(m^2).  Generalized: Well developed, in no acute distress   Neurological examination  Mentation: Alert oriented to time, place, history taking. Follows all commands speech and language fluent Cranial nerve II-XII:  Extraocular movements were full, visual field were full on confrontational test. Facial sensation and strength were normal.  Uvula tongue midline. Head turning and shoulder shrug  were normal and symmetric. Motor: The motor testing reveals 5 over 5 strength of all 4 extremities. Good symmetric motor tone is noted throughout.  Sensory: Sensory testing is intact to soft touch, pinprick and vibration sense on all 4 extremities. No evidence of extinction is noted.  Coordination: Cerebellar testing reveals good finger-nose-finger and heel-to-shin bilaterally.  Gait and station: Gait is normal. Tandem gait is normal. Romberg is negative. No drift is seen.  Reflexes: Deep tendon reflexes are symmetric and normal bilaterally.     DIAGNOSTIC DATA (LABS, IMAGING, TESTING) - I reviewed patient records, labs, notes, testing and imaging myself where available.  Lab Results  Component Value Date   WBC 5.1  08/06/2013   HGB 14.0 08/06/2013   HCT 40.9 08/06/2013   MCV 90.6 08/06/2013   PLT 248 03/30/2009      Component Value Date/Time   NA 139 08/06/2013 1204   K 4.4 08/06/2013 1204   CL 103 08/06/2013 1204   CO2 28 08/06/2013 1204   GLUCOSE 94 08/06/2013 1204   BUN 12 08/06/2013 1204   CREATININE 0.68 08/06/2013 1204   CALCIUM 9.7 08/06/2013 1204   PROT 7.0 08/06/2013 1204   ALBUMIN 4.2 08/06/2013 1204   AST 21 08/06/2013 1204   ALT 14 08/06/2013 1204   ALKPHOS 51 08/06/2013 1204   BILITOT 0.5 08/06/2013 1204   Lab Results  Component  Value Date   CHOL 167 08/06/2013   HDL 88 08/06/2013   LDLCALC 66 08/06/2013   TRIG 67 08/06/2013   CHOLHDL 1.9 08/06/2013   No results found for this basename: HGBA1C   Lab Results  Component Value Date   ZOXWRUEA54 098 08/06/2013   Lab Results  Component Value Date   TSH 2.345 08/06/2013      ASSESSMENT AND PLAN 53 y.o. year old female  has a past medical history of Allergy; Heart murmur; and Numbness and tingling. here with:  1. Disturbance of skin sensation  Patient continues to have itching in her arms and some electric like pain in the legs. Denies discomfort in the legs. She takes gabapentin for the itching in her arms and reports that it is beneifical. MRI of the cervical spine did not show any significant changes. Blood work has been unremarkable. physical exam unremarkable. She should follow-up in 6 months or sooner if needed.   Ward Givens, MSN, NP-C 09/08/2013, 2:09 PM [facility] [address], [place], [place] [address] [phone]  Note: This document was prepared with digital dictation and possible smart phrase technology. Any transcriptional errors that result from this process are unintentional.

## 2013-09-08 NOTE — Progress Notes (Signed)
I agree with the assessment and plan as directed by NP .The patient is known to me .   Evalyn Shultis, MD  

## 2013-10-28 ENCOUNTER — Other Ambulatory Visit: Payer: Self-pay

## 2013-10-28 ENCOUNTER — Telehealth: Payer: Self-pay

## 2013-10-28 NOTE — Telephone Encounter (Signed)
Pt is needing to talk with someone about being tested for lyme disease

## 2013-10-28 NOTE — Telephone Encounter (Signed)
Pt wants to know if we can test for Lyme. Advised pt that we could. Also wanted to know if we did tetanus shots. Advised her that we did that too.

## 2014-01-11 ENCOUNTER — Other Ambulatory Visit: Payer: Self-pay

## 2014-01-11 DIAGNOSIS — Z1231 Encounter for screening mammogram for malignant neoplasm of breast: Secondary | ICD-10-CM

## 2014-02-07 ENCOUNTER — Ambulatory Visit
Admission: RE | Admit: 2014-02-07 | Discharge: 2014-02-07 | Disposition: A | Discharge status: Home / Self Care | Payer: 59 | Source: Ambulatory Visit

## 2014-02-07 DIAGNOSIS — Z1231 Encounter for screening mammogram for malignant neoplasm of breast: Secondary | ICD-10-CM

## 2014-03-13 ENCOUNTER — Ambulatory Visit: Payer: 59 | Admitting: Adult Health

## 2014-04-24 ENCOUNTER — Other Ambulatory Visit: Payer: Self-pay | Admitting: Sports Medicine

## 2014-09-14 ENCOUNTER — Encounter: Payer: Self-pay | Admitting: Internal Medicine

## 2014-11-14 ENCOUNTER — Encounter: Payer: 59 | Admitting: Physician Assistant

## 2014-11-20 ENCOUNTER — Encounter: Payer: 59 | Admitting: Physician Assistant

## 2014-11-21 ENCOUNTER — Encounter: Payer: Self-pay | Admitting: Physician Assistant

## 2014-11-21 ENCOUNTER — Ambulatory Visit (INDEPENDENT_AMBULATORY_CARE_PROVIDER_SITE_OTHER): Payer: 59 | Admitting: Physician Assistant

## 2014-11-21 VITALS — BP 105/68 | HR 54 | Temp 97.5°F | Resp 16 | Ht 64.0 in | Wt 147.0 lb

## 2014-11-21 DIAGNOSIS — Z131 Encounter for screening for diabetes mellitus: Secondary | ICD-10-CM

## 2014-11-21 DIAGNOSIS — Z780 Asymptomatic menopausal state: Secondary | ICD-10-CM

## 2014-11-21 DIAGNOSIS — G609 Hereditary and idiopathic neuropathy, unspecified: Secondary | ICD-10-CM

## 2014-11-21 DIAGNOSIS — Z0000 Encounter for general adult medical examination without abnormal findings: Secondary | ICD-10-CM | POA: Diagnosis not present

## 2014-11-21 DIAGNOSIS — R1903 Right lower quadrant abdominal swelling, mass and lump: Secondary | ICD-10-CM

## 2014-11-21 DIAGNOSIS — Z13220 Encounter for screening for lipoid disorders: Secondary | ICD-10-CM | POA: Diagnosis not present

## 2014-11-21 DIAGNOSIS — Z23 Encounter for immunization: Secondary | ICD-10-CM | POA: Diagnosis not present

## 2014-11-21 LAB — CBC WITH DIFFERENTIAL/PLATELET
Basophils Absolute: 0 10*3/uL (ref 0.0–0.1)
Basophils Relative: 1 % (ref 0–1)
Eosinophils Absolute: 0.1 10*3/uL (ref 0.0–0.7)
Eosinophils Relative: 3 % (ref 0–5)
HCT: 37.1 % (ref 36.0–46.0)
Hemoglobin: 12.4 g/dL (ref 12.0–15.0)
Lymphocytes Relative: 46 % (ref 12–46)
Lymphs Abs: 1.8 10*3/uL (ref 0.7–4.0)
MCH: 30 pg (ref 26.0–34.0)
MCHC: 33.4 g/dL (ref 30.0–36.0)
MCV: 89.6 fL (ref 78.0–100.0)
MPV: 10 fL (ref 8.6–12.4)
Monocytes Absolute: 0.4 10*3/uL (ref 0.1–1.0)
Monocytes Relative: 9 % (ref 3–12)
Neutro Abs: 1.6 10*3/uL — ABNORMAL LOW (ref 1.7–7.7)
Neutrophils Relative %: 41 % — ABNORMAL LOW (ref 43–77)
Platelets: 290 10*3/uL (ref 150–400)
RBC: 4.14 MIL/uL (ref 3.87–5.11)
RDW: 13.5 % (ref 11.5–15.5)
WBC: 4 10*3/uL (ref 4.0–10.5)

## 2014-11-21 LAB — COMPREHENSIVE METABOLIC PANEL
ALT: 14 U/L (ref 6–29)
AST: 24 U/L (ref 10–35)
Albumin: 4.4 g/dL (ref 3.6–5.1)
Alkaline Phosphatase: 59 U/L (ref 33–130)
BUN: 10 mg/dL (ref 7–25)
CO2: 30 mmol/L (ref 20–31)
Calcium: 10.1 mg/dL (ref 8.6–10.4)
Chloride: 101 mmol/L (ref 98–110)
Creat: 0.71 mg/dL (ref 0.50–1.05)
Glucose, Bld: 79 mg/dL (ref 65–99)
Potassium: 4.1 mmol/L (ref 3.5–5.3)
Sodium: 139 mmol/L (ref 135–146)
Total Bilirubin: 0.4 mg/dL (ref 0.2–1.2)
Total Protein: 7.2 g/dL (ref 6.1–8.1)

## 2014-11-21 LAB — POCT URINALYSIS DIP (MANUAL ENTRY)
Bilirubin, UA: NEGATIVE
Glucose, UA: NEGATIVE
Ketones, POC UA: NEGATIVE
Leukocytes, UA: NEGATIVE
Nitrite, UA: NEGATIVE
Protein Ur, POC: NEGATIVE
Spec Grav, UA: <=1.005
Urobilinogen, UA: 0.2
pH, UA: 6

## 2014-11-21 LAB — POC MICROSCOPIC URINALYSIS (UMFC): Mucus: ABSENT

## 2014-11-21 LAB — LIPID PANEL
Cholesterol: 190 mg/dL (ref 125–200)
HDL: 83 mg/dL (ref >=46)
LDL Cholesterol: 97 mg/dL (ref <130)
Total CHOL/HDL Ratio: 2.3 Ratio (ref <=5.0)
Triglycerides: 51 mg/dL (ref <150)
VLDL: 10 mg/dL (ref <30)

## 2014-11-21 MED ORDER — ZOSTER VACCINE LIVE 19400 UNT/0.65ML ~~LOC~~ SOLR: 0.6500 mL | Freq: Once | SUBCUTANEOUS | Status: DC

## 2014-11-21 NOTE — Patient Instructions (Signed)
I will call you with the results of your lab tests. Get [facility]les [facility] at [facility] You will get a phone call to make appt for ultrasound, and I will call you with those results as well. Take 1200 mg calcium a day and 800 units vit D a day Return in 1 year for physical.

## 2014-11-21 NOTE — Progress Notes (Signed)
   Subjective:    Patient ID: Kristina Flores, female    DOB: 05-Nov-1960, 54 y.o.   MRN: [id number]  HPI    Review of Systems  Constitutional: Negative.   HENT: Negative.   Eyes: Negative.   Respiratory: Negative.   Cardiovascular: Negative.   Gastrointestinal: Negative.   Endocrine: Negative.   Genitourinary: Negative.   Musculoskeletal: Negative.   Skin: Negative.   Allergic/Immunologic: Positive for environmental allergies.  Neurological: Positive for facial asymmetry.  Hematological: Negative.   Psychiatric/Behavioral: Negative.        Objective:   Physical Exam        Assessment & Plan:

## 2014-11-21 NOTE — Progress Notes (Signed)
[facility] [address], [place] [address] [phone]  Date:  11/21/2014   Name:  Kristina Flores   DOB:  1960/08/03   MRN:  [id number]  PCP:  Ellsworth Lennox, MD    Chief Complaint: Annual Exam   History of Present Illness:  This is a 54 y.o. female with PMH idiopathic neuropathy who is presenting for CPE.  She is followed by [facility] neurology for idiopathic neuropathy. Paresthesias occur on bilateral lower legs and left side of face. She takes gabapentin 600 mg total a day.  One month ago she noticed a mass in her right lower abdomen. She is unsure if she is just now noticing because she has lost some weight (intentional). She has lost 30 pounds in the past 1 year. The mass is non painful.  Last pap: 07/2014 - normal. Goes to GYN. Gets manual breast exams there as well. Immunizations: had chicken pox as a child. Wants zostavax. Up to date on flu and tdap. Dentist: regular 6 month visits Eye: goes to eye doctor regularly. Diet/Exercise: weight watchers diet. Has lost 30 pounds in past year. Pilates 2-3 times a week and walking for exercise. Fam hx: colon cancer and prostate cancer in dad - colon cancer dx'd age 72, Mom died of leukemia at age 38, MGM with breast cancer age 69. No hx DM, CAD, CVA. Tobacco/alcohol/substance use: no/3 wine or beer a week/no Mammogram: 02/07/14 and negative Colonoscopy: 2015. On 5 year cycle d/t fam hx colon cancer Dexa: NA  Sleeps well. Mood is good.  Review of Systems:  Review of Systems See CMA note  Patient Active Problem List   Diagnosis Date Noted  . 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 Nonallopathic lesion of lumbosacral region 03/02/2012  . Nonallopathic lesion of cervical region 03/02/2012  . DDD (degenerative disc disease), lumbar 09/20/2011  . Menopause 09/20/2011  . Allergic rhinitis 09/20/2011    Prior to Admission medications     Medication Sig Start Date End Date Taking? Authorizing Provider  Alpha-Lipoic Acid 600 MG CAPS Take by mouth.   Yes Historical Provider, MD  Calcium Carb-Cholecalciferol (CALCIUM 600 + D PO) Take 1 tablet by mouth daily.   Yes Historical Provider, MD  Cholecalciferol 1000 UNITS capsule Take 1,000 Units by mouth daily.   Yes Historical Provider, MD  gabapentin (NEURONTIN) 100 MG capsule Take 100 mg by mouth as needed.   Yes Historical Provider, MD  Omega-3 Fatty Acids (FISH OIL) 1000 MG CAPS Take 1 capsule by mouth daily.   Yes Historical Provider, MD  meloxicam (MOBIC) 15 MG tablet Take 15 mg by mouth as needed. 01/26/13   Thurman Coyer, DO    No Known Allergies  Past Surgical History  Procedure Laterality Date  . Miscarriage 1989    . Ovarian cyst 1992    . Fibroid in 2011,2003      Social History  Substance Use Topics  . Smoking status: Never Smoker   . Smokeless tobacco: Never Used  . Alcohol Use: Yes    Family History  Problem Relation Age of Onset  . Colon cancer Father 78    Medication list has been reviewed and updated.  Physical Examination:  Physical Exam  Constitutional: She is oriented to person, place, and time. She appears well-developed and well-nourished. No distress.  HENT:  Head: Normocephalic and atraumatic.  Right Ear: Hearing, tympanic membrane, external ear and ear canal normal.  Left Ear: Hearing, tympanic membrane, external ear and ear canal normal.  Nose: Nose normal.  Mouth/Throat: Uvula is midline, oropharynx is clear and moist and mucous membranes are normal.  Eyes: Conjunctivae, EOM and lids are normal. Pupils are equal, round, and reactive to light. Right eye exhibits no discharge. Left eye exhibits no discharge. No scleral icterus.  Neck: Trachea normal. Carotid bruit is not present. No thyromegaly present.  Cardiovascular: Normal rate, regular rhythm, intact distal pulses and normal pulses.   Murmur (known) heard.  Systolic murmur is  present with a grade of 2/6  Pulmonary/Chest: Effort normal and breath sounds normal. No respiratory distress. She has no wheezes. She has no rhonchi. She has no rales.  Abdominal: Soft. Normal appearance and bowel sounds are normal. She exhibits no abdominal bruit. There is no tenderness.    3 cm superficial mobile nontender mass in upper portion of RLQ near ilium  Genitourinary:  Breast exam deferred (performed at GYN)  Musculoskeletal: Normal range of motion.  Lymphadenopathy:       Head (right side): No submental, no submandibular, no tonsillar, no preauricular, no posterior auricular and no occipital adenopathy present.       Head (left side): No submental, no submandibular, no tonsillar, no preauricular, no posterior auricular and no occipital adenopathy present.    She has no cervical adenopathy.  Neurological: She is alert and oriented to person, place, and time. She has normal strength and normal reflexes. No cranial nerve deficit or sensory deficit. Coordination and gait normal.  Skin: Skin is warm, dry and intact. No lesion and no rash noted.  No LE edema, pedal pulses intact.  Psychiatric: She has a normal mood and affect. Her speech is normal and behavior is normal. Thought content normal.    BP 105/68 mmHg  Pulse 54  Temp(Src) 97.5 F (36.4 C) (Oral)  Resp 16  Ht 5\' 4"  (1.626 m)  Wt 147 lb (66.679 kg)  BMI 25.22 kg/m2  SpO2 97%  Results for orders placed or performed in visit on 11/21/14  POCT urinalysis dipstick  Result Value Ref Range   Color, UA light yellow (A) yellow   Clarity, UA clear clear   Glucose, UA negative negative   Bilirubin, UA negative negative   Ketones, POC UA negative negative   Spec Grav, UA <=1.005    Blood, UA trace-intact (A) negative   pH, UA 6.0    Protein Ur, POC negative negative   Urobilinogen, UA 0.2    Nitrite, UA Negative Negative   Leukocytes, UA Negative Negative  POCT Microscopic Urinalysis ([facility])  Result Value Ref Range     WBC,UR,HPF,POC None None WBC/hpf   RBC,UR,HPF,POC None None RBC/hpf   Bacteria None None, Too numerous to count   Mucus Absent Absent   Epithelial Cells, UR Per Microscopy Few (A) None, Too numerous to count cells/hpf    Visual Acuity Screening   Right eye Left eye Both eyes  Without correction: 20/15 20/20 20/15   With correction:      Assessment and Plan:  1. Annual physical exam Pt doing great with diet, exercise and weight loss. Up to date on immunizations. Up to date on mammogram, colonoscopy and pap smear. Takes calcium + vit D supplements. Urine dip with trace blood but micro negative for blood. Labs below pending. - CBC with Differential/Platelet - POCT urinalysis dipstick - POCT Microscopic Urinalysis ([facility])  2. Menopause Continue calc + vit D supplements. Advised to take only one vit D supplement,  not two. - Vit D  25 hydroxy (rtn osteoporosis monitoring)  3. Lipid screening - Lipid panel  4. Screening for diabetes mellitus - Comprehensive metabolic panel  5. RLQ abdominal mass Mass appears benign - superficial and mobile. Will get ultrasound of mass for further eval. - US Abdomen Limited; Future  6. Need for shingles vaccine - zoster vaccine live, PF, (ZOSTAVAX) 29244 UNT/0.65ML injection; Inject 19,400 Units into the skin once.  Dispense: 1 each; Refill: 0  7. Idiopathic neuropathy Followed by [facility] neurology.   Benjaman Pott Drenda Freeze, MHS [facility]  11/21/2014

## 2014-11-22 LAB — VITAMIN D 25 HYDROXY (VIT D DEFICIENCY, FRACTURES): Vit D, 25-Hydroxy: 40 ng/mL (ref 30–100)

## 2014-12-01 ENCOUNTER — Ambulatory Visit
Admission: RE | Admit: 2014-12-01 | Discharge: 2014-12-01 | Disposition: A | Discharge status: Home / Self Care | Payer: 59 | Source: Ambulatory Visit | Attending: Physician Assistant | Admitting: Physician Assistant

## 2014-12-01 ENCOUNTER — Other Ambulatory Visit: Payer: 59

## 2014-12-01 DIAGNOSIS — R1903 Right lower quadrant abdominal swelling, mass and lump: Secondary | ICD-10-CM

## 2014-12-20 ENCOUNTER — Other Ambulatory Visit: Payer: Self-pay | Admitting: Family Medicine

## 2015-01-09 ENCOUNTER — Other Ambulatory Visit: Payer: Self-pay

## 2015-01-09 DIAGNOSIS — Z1231 Encounter for screening mammogram for malignant neoplasm of breast: Secondary | ICD-10-CM

## 2015-01-18 DIAGNOSIS — H524 Presbyopia: Secondary | ICD-10-CM | POA: Diagnosis not present

## 2015-01-18 DIAGNOSIS — H5203 Hypermetropia, bilateral: Secondary | ICD-10-CM | POA: Diagnosis not present

## 2015-01-18 DIAGNOSIS — H52223 Regular astigmatism, bilateral: Secondary | ICD-10-CM | POA: Diagnosis not present

## 2015-02-12 ENCOUNTER — Ambulatory Visit
Admission: RE | Admit: 2015-02-12 | Discharge: 2015-02-12 | Disposition: A | Discharge status: Home / Self Care | Payer: 59 | Source: Ambulatory Visit

## 2015-02-12 DIAGNOSIS — Z1231 Encounter for screening mammogram for malignant neoplasm of breast: Secondary | ICD-10-CM

## 2015-03-28 MED FILL — GABAPENTIN 300 MG CAPSULE: 300 | 90 days supply | Qty: 180 | Fill #3

## 2015-05-29 DIAGNOSIS — L299 Pruritus, unspecified: Secondary | ICD-10-CM | POA: Diagnosis not present

## 2015-06-26 MED FILL — GABAPENTIN 300 MG CAPSULE: 300 | 90 days supply | Qty: 180 | Fill #0 | Status: TO

## 2015-07-06 ENCOUNTER — Ambulatory Visit (INDEPENDENT_AMBULATORY_CARE_PROVIDER_SITE_OTHER): Payer: 59 | Admitting: Physician Assistant

## 2015-07-06 VITALS — BP 118/80 | HR 66 | Temp 97.8°F | Resp 16 | Ht 64.0 in | Wt 146.0 lb

## 2015-07-06 DIAGNOSIS — M79644 Pain in right finger(s): Secondary | ICD-10-CM

## 2015-07-06 DIAGNOSIS — M7989 Other specified soft tissue disorders: Secondary | ICD-10-CM | POA: Diagnosis not present

## 2015-07-06 MED ORDER — CEPHALEXIN 500 MG PO CAPS: 500.0000 mg | ORAL_CAPSULE | Freq: Two times a day (BID) | ORAL | Status: AC

## 2015-07-06 MED FILL — CEPHALEXIN 500 MG CAPSULE: 500 | 10 days supply | Qty: 20 | Fill #0

## 2015-07-06 NOTE — Patient Instructions (Addendum)
     IF you received an x-ray today, you will receive an invoice from [facility]. Please contact [facility] at [phone] with questions or concerns regarding your invoice.   IF you received labwork today, you will receive an invoice from [employer]. Please contact [facility] at [phone] with questions or concerns regarding your invoice.   Our billing staff will not be able to assist you with questions regarding bills from these companies.  You will be contacted with the lab results as soon as they are available. The fastest way to get your results is to activate your My Chart account. Instructions are located on the last page of this paperwork. If you have not heard from [place] regarding the results in 2 weeks, please contact this office.    Please perform warm soaks as much as possible to the thumb, at least 4 times per day for 15 minutes.  I would like you to also take the antibioitic as prescribed, to completion. Fingertip Infection When an infection is around the nail, it is called a paronychia. When it appears over the tip of the finger, it is called a felon. These infections are due to minor injuries or cracks in the skin. If they are not treated properly, they can lead to bone infection and permanent damage to the fingernail. Incision and drainage is necessary if a pus pocket (an abscess) has formed. Antibiotics and pain medicine may also be needed. Keep your hand elevated for the next 2-3 days to reduce swelling and pain. If a Kristina Flores was placed in the abscess, it should be removed in 1-2 days by your caregiver. Soak the finger in warm water for 20 minutes 4 times daily to help promote drainage. Keep the hands as dry as possible. Wear protective gloves with cotton liners. See your caregiver for follow-up care as recommended.  HOME CARE INSTRUCTIONS   Keep wound clean, dry and dressed as suggested by your caregiver.  Soak in warm salt water for  fifteen minutes, four times per day for bacterial infections.  Your caregiver will prescribe an antibiotic if a bacterial infection is suspected. Take antibiotics as directed and finish the prescription, even if the problem appears to be improving before the medicine is gone.  Only take over-the-counter or prescription medicines for pain, discomfort, or fever as directed by your caregiver. SEEK IMMEDIATE MEDICAL CARE IF:  There is redness, swelling, or increasing pain in the wound.  Pus or any other unusual drainage is coming from the wound.  An unexplained oral temperature above 102 F (38.9 C) develops.  You notice a foul smell coming from the wound or dressing. MAKE SURE YOU:   Understand these instructions.  Monitor your condition.  Contact your caregiver if you are getting worse or not improving.   This information is not intended to replace advice given to you by your health care provider. Make sure you discuss any questions you have with your health care provider.   Document Released: 02/07/2004 Document Revised: 03/24/2011 Document Reviewed: 06/19/2014 Elsevier Interactive Patient Education [employer].

## 2015-07-06 NOTE — Progress Notes (Signed)
[facility] [address] [place] [address] [phone]  Date:  07/06/2015   Name:  Kristina Flores   DOB:  Mar 05, 1960   MRN:  [id number]  PCP:  Ellsworth Lennox, MD   Chief Complaint  Patient presents with  . Hand Pain    RIGHT THUMB PAIN AND SWELLING, PER PT WAS WORKING OUTSIDE DID HAVE ON GLOVES THINKS SOMETHING BITE HER     History of Present Illness:  Kristina Flores is a 55 y.o. female patient who presents to [facility] for cc of  She was working in the yard Brewing technologist.  Started yesterday, very painful.  Warm to the touch.  No fever or chills.  No numbness or gintling.  She never got bit.  She had some ants around, where she was doing garden work.  No pricking fingers.  She was working incredibly hard with her right hand.  No hx of gout.  No difficulty moving the finger.    Chief Complaint  Patient presents with  . Hand Pain    RIGHT THUMB PAIN AND SWELLING, PER PT WAS WORKING OUTSIDE DID HAVE ON GLOVES THINKS SOMETHING BITE HER    Patient Active Problem List   Diagnosis Date Noted  . 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 Nonallopathic lesion of lumbosacral region 03/02/2012  . Nonallopathic lesion of cervical region 03/02/2012  . DDD (degenerative disc disease), lumbar 09/20/2011  . Menopause 09/20/2011  . Allergic rhinitis 09/20/2011    Past Medical History  Diagnosis Date  . Allergy   . Heart murmur   . Numbness and tingling     Past Surgical History  Procedure Laterality Date  . Miscarriage 1989    . Ovarian cyst 1992    . Fibroid in 2011,2003      Social History  Substance Use Topics  . Smoking status: Never Smoker   . Smokeless tobacco: Never Used  . Alcohol Use: Yes    Family History  Problem Relation Age of Onset  . Colon cancer Father 45    No Known Allergies  Medication list has been reviewed and updated.  Current Outpatient Prescriptions on File Prior  to Visit  Medication Sig Dispense Refill  . Alpha-Lipoic Acid 600 MG CAPS Take by mouth.    . Calcium Carb-Cholecalciferol (CALCIUM 600 + D PO) Take 1 tablet by mouth daily.    . Cholecalciferol 1000 UNITS capsule Take 1,000 Units by mouth daily.    Marland Kitchen gabapentin (NEURONTIN) 100 MG capsule Take 100 mg by mouth as needed.    . meloxicam (MOBIC) 15 MG tablet Take 15 mg by mouth as needed.    . Omega-3 Fatty Acids (FISH OIL) 1000 MG CAPS Take 1 capsule by mouth daily.    Marland Kitchen zoster vaccine live, PF, (ZOSTAVAX) 22025 UNT/0.65ML injection Inject 19,400 Units into the skin once. 1 each 0   No current facility-administered medications on file prior to visit.    ROS ROS otherwise unremarkable unless listed above.  Physical Examination: BP 118/80 mmHg  Pulse 66  Temp(Src) 97.8 F (36.6 C) (Oral)  Resp 16  Ht 5\' 4"  (1.626 m)  Wt 146 lb (66.225 kg)  BMI 25.05 kg/m2  SpO2 96% Ideal Body Weight: Weight in (lb) to have BMI = 25: 145.3  Physical Exam  Constitutional: She is oriented to person, place, and time. She appears well-developed and well-nourished. No distress.  HENT:  Head: Normocephalic and atraumatic.  Right Ear: External ear normal.  Left Ear: External ear normal.  Eyes: Conjunctivae and EOM are normal. Pupils are equal, round, and reactive to light.  Cardiovascular: Normal rate.   Pulmonary/Chest: Effort normal. No respiratory distress.  Neurological: She is alert and oriented to person, place, and time.  Skin: She is not diaphoretic.  Erythematous swelling at the distal portion of right dip of palmar thumb.  Lateral side with increased pain that wraps around to the right corner of nailbed.  No evidence of paronychia seen.  Normal range of motion.  Psychiatric: She has a normal mood and affect. Her behavior is normal.     Assessment and Plan: Kristina Flores is a 55 y.o. female who is here today for cc of right thumb pain. Inflammation secondary to over use vs  infection. Advised ice, and placed on keflex for 10 days.  Alarming symptoms discussed. Thumb pain, right - Plan: cephALEXin (KEFLEX) 500 MG capsule  Swelling of right thumb - Plan: cephALEXin (KEFLEX) 500 MG capsule   Ivar Drape, PA-C [facility] 07/06/2015 10:47 AM

## 2015-07-19 DIAGNOSIS — D485 Neoplasm of uncertain behavior of skin: Secondary | ICD-10-CM | POA: Diagnosis not present

## 2015-07-19 DIAGNOSIS — B081 Molluscum contagiosum: Secondary | ICD-10-CM | POA: Diagnosis not present

## 2015-07-19 DIAGNOSIS — R202 Paresthesia of skin: Secondary | ICD-10-CM | POA: Diagnosis not present

## 2015-08-02 DIAGNOSIS — Z6825 Body mass index (BMI) 25.0-25.9, adult: Secondary | ICD-10-CM | POA: Diagnosis not present

## 2015-08-02 DIAGNOSIS — Z01419 Encounter for gynecological examination (general) (routine) without abnormal findings: Secondary | ICD-10-CM | POA: Diagnosis not present

## 2015-09-25 MED FILL — GABAPENTIN 300 MG CAPSULE: 300 | 90 days supply | Qty: 180 | Fill #0

## 2015-10-23 DIAGNOSIS — Z23 Encounter for immunization: Secondary | ICD-10-CM | POA: Diagnosis not present

## 2015-10-23 DIAGNOSIS — L821 Other seborrheic keratosis: Secondary | ICD-10-CM | POA: Diagnosis not present

## 2015-10-23 DIAGNOSIS — L814 Other melanin hyperpigmentation: Secondary | ICD-10-CM | POA: Diagnosis not present

## 2015-10-23 DIAGNOSIS — D225 Melanocytic nevi of trunk: Secondary | ICD-10-CM | POA: Diagnosis not present

## 2015-10-23 DIAGNOSIS — D1800 Hemangioma unspecified site: Secondary | ICD-10-CM | POA: Diagnosis not present

## 2015-12-26 MED FILL — GABAPENTIN 300 MG CAPSULE: 300 | 90 days supply | Qty: 180 | Fill #1

## 2016-01-02 ENCOUNTER — Encounter: Payer: Self-pay | Admitting: Family Medicine

## 2016-01-02 ENCOUNTER — Ambulatory Visit (INDEPENDENT_AMBULATORY_CARE_PROVIDER_SITE_OTHER): Payer: 59 | Admitting: Family Medicine

## 2016-01-02 VITALS — BP 104/60 | HR 58 | Temp 97.6°F | Resp 16 | Ht 64.0 in | Wt 149.0 lb

## 2016-01-02 DIAGNOSIS — E7800 Pure hypercholesterolemia, unspecified: Secondary | ICD-10-CM

## 2016-01-02 DIAGNOSIS — Z136 Encounter for screening for cardiovascular disorders: Secondary | ICD-10-CM

## 2016-01-02 DIAGNOSIS — G609 Hereditary and idiopathic neuropathy, unspecified: Secondary | ICD-10-CM

## 2016-01-02 DIAGNOSIS — Z0000 Encounter for general adult medical examination without abnormal findings: Secondary | ICD-10-CM | POA: Diagnosis not present

## 2016-01-02 DIAGNOSIS — J301 Allergic rhinitis due to pollen: Secondary | ICD-10-CM | POA: Diagnosis not present

## 2016-01-02 DIAGNOSIS — Z114 Encounter for screening for human immunodeficiency virus [HIV]: Secondary | ICD-10-CM

## 2016-01-02 DIAGNOSIS — M5136 Other intervertebral disc degeneration, lumbar region: Secondary | ICD-10-CM | POA: Diagnosis not present

## 2016-01-02 DIAGNOSIS — D508 Other iron deficiency anemias: Secondary | ICD-10-CM | POA: Diagnosis not present

## 2016-01-02 DIAGNOSIS — Z1159 Encounter for screening for other viral diseases: Secondary | ICD-10-CM | POA: Diagnosis not present

## 2016-01-02 NOTE — Patient Instructions (Addendum)
   IF you received an x-ray today, you will receive an invoice from [facility]. Please contact [facility] at [phone] with questions or concerns regarding your invoice.   IF you received labwork today, you will receive an invoice from [facility]. Please contact [facility] at [phone] with questions or concerns regarding your invoice.   Our billing staff will not be able to assist you with questions regarding bills from these companies.  You will be contacted with the lab results as soon as they are available. The fastest way to get your results is to activate your My Chart account. Instructions are located on the last page of this paperwork. If you have not heard from us regarding the results in 2 weeks, please contact this office.     Keeping You Healthy  Get These Tests  Blood Pressure- Have your blood pressure checked by your healthcare provider at least once a year.  Normal blood pressure is 120/80.  Weight- Have your body mass index (BMI) calculated to screen for obesity.  BMI is a measure of body fat based on height and weight.  You can calculate your own BMI at www.nhlbisupport.com/bmi/  Cholesterol- Have your cholesterol checked every year.  Diabetes- Have your blood sugar checked every year if you have high blood pressure, high cholesterol, a family history of diabetes or if you are overweight.  Pap Test - Have a pap test every 1 to 5 years if you have been sexually active.  If you are older than 65 and recent pap tests have been normal you may not need additional pap tests.  In addition, if you have had a hysterectomy  for benign disease additional pap tests are not necessary.  Mammogram-Yearly mammograms are essential for early detection of breast cancer  Screening for Colon Cancer- Colonoscopy starting at age 50. Screening may begin sooner depending on your family history and other health conditions.  Follow up colonoscopy as directed by your  Gastroenterologist.  Screening for Osteoporosis- Screening begins at age 65 with bone density scanning, sooner if you are at higher risk for developing Osteoporosis.  Get these medicines  Calcium with Vitamin D- Your body requires 1200-1500 mg of Calcium a day and 800-1000 IU of Vitamin D a day.  You can only absorb 500 mg of Calcium at a time therefore Calcium must be taken in 2 or 3 separate doses throughout the day.  Hormones- Hormone therapy has been associated with increased risk for certain cancers and heart disease.  Talk to your healthcare provider about if you need relief from menopausal symptoms.  Aspirin- Ask your healthcare provider about taking Aspirin to prevent Heart Disease and Stroke.  Get these Immuniztions  Flu shot- Every fall  Pneumonia shot- Once after the age of 65; if you are younger ask your healthcare provider if you need a pneumonia shot.  Tetanus- Every ten years.  Zostavax- Once after the age of 60 to prevent shingles.  Take these steps  Don't smoke- Your healthcare provider can help you quit. For tips on how to quit, ask your healthcare provider or go to www.smokefree.gov or call 1-800 QUIT-NOW.  Be physically active- Exercise 5 days a week for a minimum of 30 minutes.  If you are not already physically active, start slow and gradually work up to 30 minutes of moderate physical activity.  Try walking, dancing, bike riding, swimming, etc.  Eat a healthy diet- Eat a variety of healthy foods such as fruits, vegetables, whole grains, low   fat milk, low fat cheeses, yogurt, lean meats, chicken, fish, eggs, dried beans, tofu, etc.  For more information go to www.thenutritionsource.org  Dental visit- Brush and floss teeth twice daily; visit your dentist twice a year.  Eye exam- Visit your Optometrist or Ophthalmologist yearly.  Drink alcohol in moderation- Limit alcohol intake to one drink or less a day.  Never drink and drive.  Depression- Your emotional  health is as important as your physical health.  If you're feeling down or losing interest in things you normally enjoy, please talk to your healthcare provider.  Seat Belts- can save your life; always wear one  Smoke/Carbon Monoxide detectors- These detectors need to be installed on the appropriate level of your home.  Replace batteries at least once a year.  Violence- If anyone is threatening or hurting you, please tell your healthcare provider.  Living Will/ Health care power of attorney- Discuss with your healthcare provider and family.  

## 2016-01-02 NOTE — Progress Notes (Signed)
Subjective:    Patient ID: Kristina Flores, female    DOB: Dec 16, 1960, 55 y.o.   MRN: [id number]  01/02/2016  Annual Exam   HPI This 55 y.o. female presents for Complete Physical Examination.  Last physical: 08-06-2013 Kristina Flores; Kristina Flores 2016 Pap smear: 07-2014; Kristina Flores. Mammogram: 01/2015 Colonoscopy:  2015 Bone density:  never Eye exam:  Glasses; 01/2015 Dental exam:  Every six months  Immunization History  Administered Date(s) Administered  . Influenza Split 10/31/2011, 09/14/2014  . Influenza-Unspecified 10/14/2015  . Tdap 03/14/2014  . Zoster 01/13/2014   Wt Readings from Last 3 Encounters:  01/02/16 149 lb (67.6 kg)  07/06/15 146 lb (66.2 kg)  11/21/14 147 lb (66.7 kg)    BP Readings from Last 3 Encounters:  01/02/16 104/60  07/06/15 118/80  11/21/14 105/68    Peripheral neuropathy idiopathic; followed at [facility] pheripheral neuropathy.   Review of Systems  Constitutional: Negative for activity change, appetite change, chills, diaphoresis, fatigue, fever and unexpected weight change.  HENT: Negative for congestion, dental problem, drooling, ear discharge, ear pain, facial swelling, hearing loss, mouth sores, nosebleeds, postnasal drip, rhinorrhea, sinus pressure, sneezing, sore throat, tinnitus, trouble swallowing and voice change.   Eyes: Negative for photophobia, pain, discharge, redness, itching and visual disturbance.  Respiratory: Negative for apnea, cough, choking, chest tightness, shortness of breath, wheezing and stridor.   Cardiovascular: Negative for chest pain, palpitations and leg swelling.  Gastrointestinal: Negative for abdominal distention, abdominal pain, anal bleeding, blood in stool, constipation, diarrhea, nausea, rectal pain and vomiting.  Endocrine: Negative for cold intolerance, heat intolerance, polydipsia, polyphagia and polyuria.  Genitourinary: Negative for decreased urine volume, difficulty urinating, dyspareunia, dysuria, enuresis, flank pain,  frequency, genital sores, hematuria, menstrual problem, pelvic pain, urgency, vaginal bleeding, vaginal discharge and vaginal pain.       Nocturia x 2-3.  Urge incontinence; stress incontinence rare.  Musculoskeletal: Negative for arthralgias, back pain, gait problem, joint swelling, myalgias, neck pain and neck stiffness.  Skin: Negative for color change, pallor, rash and wound.  Allergic/Immunologic: Negative for environmental allergies, food allergies and immunocompromised state.  Neurological: Negative for dizziness, tremors, seizures, syncope, facial asymmetry, speech difficulty, weakness, light-headedness, numbness and headaches.  Hematological: Negative for adenopathy. Does not bruise/bleed easily.  Psychiatric/Behavioral: Negative for agitation, behavioral problems, confusion, decreased concentration, dysphoric mood, hallucinations, self-injury, sleep disturbance and suicidal ideas. The patient is not nervous/anxious and is not hyperactive.        Bedtime 10:30pm; wakes up at 6:00am.      Past Medical History:  Diagnosis Date  . Allergy   . Heart murmur   . Numbness and tingling   . Peripheral neuropathy, idiopathic    Past Surgical History:  Procedure Laterality Date  . fibroid in 2011,2003    . miscarriage 1989    . ovarian cyst 1992     No Known Allergies Current Outpatient Prescriptions  Medication Sig Dispense Refill  . Alpha-Lipoic Acid 600 MG CAPS Take by mouth.    . Calcium Carb-Cholecalciferol (CALCIUM 600 + D PO) Take 1 tablet by mouth daily.    . Cholecalciferol 1000 UNITS capsule Take 1,000 Units by mouth daily.    . cyclobenzaprine (FLEXERIL) 10 MG tablet Take 10 mg by mouth at bedtime. As needed for back pain.    Marland Kitchen gabapentin (NEURONTIN) 100 MG capsule Take 100 mg by mouth as needed.    . meloxicam (MOBIC) 15 MG tablet Take 15 mg by mouth as needed.    . Omega-3 Fatty  Acids (FISH OIL) 1000 MG CAPS Take 1 capsule by mouth daily.     No current  facility-administered medications for this visit.    Social History   Social History  . Marital status: Married    Spouse name: Kristina Flores  . Number of children: 3  . Years of education: College   Occupational History  . PRESCHOOL TEACHER [employer]   Social History Main Topics  . Smoking status: Never Smoker  . Smokeless tobacco: Never Used  . Alcohol use Yes  . Drug use: No  . Sexual activity: Not on file   Other Topics Concern  . Not on file   Social History Narrative   Patient is married Kristina Flores) and lives at home with her husband. Married x 35 years.   Patient has three adult children.  No grandchildren in 2017.   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   Patient has a college education.   Patient is right-handed.   Patient drinks one cup of coffee daily.   Family History  Problem Relation Age of Onset  . Cancer Mother     leukemia  . Colon cancer Father 84  . Cancer Father     prostate cancer, colon cancer       Objective:    BP 104/60 (BP Location: Right Arm, Patient Position: Sitting, Cuff Size: Normal)   Pulse (!) 58   Temp 97.6 F (36.4 C) (Oral)   Resp 16   Ht 5\' 4"  (1.626 m)   Wt 149 lb (67.6 kg)   SpO2 100%   BMI 25.58 kg/m  Physical Exam  Constitutional: She is oriented to person, place, and time. She appears well-developed and well-nourished. No distress.  HENT:  Head: Normocephalic and atraumatic.  Right Ear: External ear normal.  Left Ear: External ear normal.  Nose: Nose normal.  Mouth/Throat: Oropharynx is clear and moist.  Eyes: Conjunctivae and EOM are normal. Pupils are equal, round, and reactive to light.  Neck: Normal range of motion and full passive range of motion without pain. Neck supple. No JVD present. Carotid bruit is not present. No thyromegaly present.  Cardiovascular: Normal rate, regular rhythm and normal heart sounds.  Exam reveals no gallop and no friction rub.   No murmur  heard. Pulmonary/Chest: Effort normal and breath sounds normal. She has no wheezes. She has no rales.  Abdominal: Soft. Bowel sounds are normal. She exhibits no distension and no mass. There is no tenderness. There is no rebound and no guarding.  Musculoskeletal:       Right shoulder: Normal.       Left shoulder: Normal.       Cervical back: Normal.  Lymphadenopathy:    She has no cervical adenopathy.  Neurological: She is alert and oriented to person, place, and time. She has normal reflexes. No cranial nerve deficit. She exhibits normal muscle tone. Coordination normal.  Skin: Skin is warm and dry. No rash noted. She is not diaphoretic. No erythema. No pallor.  Psychiatric: She has a normal mood and affect. Her behavior is normal. Judgment and thought content normal.  Nursing note and vitals reviewed.   Depression screen [facility] [address] 01/02/2016 07/06/2015 11/21/2014  Decreased Interest 0 0 0  Down, Depressed, Hopeless 0 0 0  PHQ - 2 Score 0 0 0   Fall Risk  01/02/2016 07/06/2015  Falls in the past year? No No       Assessment & Plan:  1. Routine physical examination   2. Chronic seasonal allergic rhinitis due to pollen   3. Idiopathic neuropathy   4. DDD (degenerative disc disease), lumbar   5. Screening for cardiovascular condition   6. Other iron deficiency anemia   7. Encounter for hepatitis C screening test for low risk patient   8. Screening for HIV (human immunodeficiency virus)   9. Pure hypercholesterolemia     Orders Placed This Encounter  Procedures  . CBC with Differential/Platelet  . Comprehensive metabolic panel    Order Specific Question:   Has the patient fasted?    Answer:   Yes  . Lipid panel    Order Specific Question:   Has the patient fasted?    Answer:   Yes  . HIV antibody  . TSH  . VITAMIN D 25 Hydroxy (Vit-D Deficiency, Fractures)  . Vitamin B12  . Hemoglobin A1c  . Hepatitis C antibody  . POCT urinalysis dipstick  . EKG 12-Lead   Meds  ordered this encounter  Medications  . cyclobenzaprine (FLEXERIL) 10 MG tablet    Sig: Take 10 mg by mouth at bedtime. As needed for back pain.    Return in about 1 year (around 01/01/2017) for complete physical examiniation.   Shenekia Riess Elayne Guerin, M.D. [facility] [address] [place], [place]  [address] [phone] phone [phone] fax

## 2016-01-03 LAB — COMPREHENSIVE METABOLIC PANEL
ALT: 14 IU/L (ref 0–32)
AST: 25 IU/L (ref 0–40)
Albumin/Globulin Ratio: 1.9 (ref 1.2–2.2)
Albumin: 4.4 g/dL (ref 3.5–5.5)
Alkaline Phosphatase: 59 IU/L (ref 39–117)
BUN/Creatinine Ratio: 15 (ref 9–23)
BUN: 12 mg/dL (ref 6–24)
Bilirubin Total: <0.2 mg/dL (ref 0.0–1.2)
CO2: 26 mmol/L (ref 18–29)
Calcium: 9.2 mg/dL (ref 8.7–10.2)
Chloride: 101 mmol/L (ref 96–106)
Creatinine, Ser: 0.8 mg/dL (ref 0.57–1.00)
GFR calc Af Amer: 95 mL/min/{1.73_m2} (ref >59)
GFR calc non Af Amer: 82 mL/min/{1.73_m2} (ref >59)
Globulin, Total: 2.3 g/dL (ref 1.5–4.5)
Glucose: 85 mg/dL (ref 65–99)
Potassium: 4.3 mmol/L (ref 3.5–5.2)
Sodium: 144 mmol/L (ref 134–144)
Total Protein: 6.7 g/dL (ref 6.0–8.5)

## 2016-01-03 LAB — LIPID PANEL
Chol/HDL Ratio: 2.1 ratio units (ref 0.0–4.4)
Cholesterol, Total: 191 mg/dL (ref 100–199)
HDL: 93 mg/dL (ref >39)
LDL Calculated: 91 mg/dL (ref 0–99)
Triglycerides: 33 mg/dL (ref 0–149)
VLDL Cholesterol Cal: 7 mg/dL (ref 5–40)

## 2016-01-03 LAB — CBC WITH DIFFERENTIAL/PLATELET
Basophils Absolute: 0 x10E3/uL (ref 0.0–0.2)
Basos: 1 %
EOS (ABSOLUTE): 0.1 x10E3/uL (ref 0.0–0.4)
Eos: 4 %
Hematocrit: 41.2 % (ref 34.0–46.6)
Hemoglobin: 13.2 g/dL (ref 11.1–15.9)
Immature Grans (Abs): 0 x10E3/uL (ref 0.0–0.1)
Immature Granulocytes: 0 %
Lymphocytes Absolute: 1.3 x10E3/uL (ref 0.7–3.1)
Lymphs: 46 %
MCH: 29.5 pg (ref 26.6–33.0)
MCHC: 32 g/dL (ref 31.5–35.7)
MCV: 92 fL (ref 79–97)
Monocytes Absolute: 0.3 x10E3/uL (ref 0.1–0.9)
Monocytes: 9 %
Neutrophils Absolute: 1.2 x10E3/uL — ABNORMAL LOW (ref 1.4–7.0)
Neutrophils: 40 %
Platelets: 280 x10E3/uL (ref 150–379)
RBC: 4.48 x10E6/uL (ref 3.77–5.28)
RDW: 14.8 % (ref 12.3–15.4)
WBC: 2.9 x10E3/uL — ABNORMAL LOW (ref 3.4–10.8)

## 2016-01-03 LAB — HEMOGLOBIN A1C
Est. average glucose Bld gHb Est-mCnc: 108 mg/dL
Hgb A1c MFr Bld: 5.4 % (ref 4.8–5.6)

## 2016-01-03 LAB — HEPATITIS C ANTIBODY: Hep C Virus Ab: <0.1 {s_co_ratio} (ref 0.0–0.9)

## 2016-01-03 LAB — VITAMIN D 25 HYDROXY (VIT D DEFICIENCY, FRACTURES): Vit D, 25-Hydroxy: 44.8 ng/mL (ref 30.0–100.0)

## 2016-01-03 LAB — VITAMIN B12: Vitamin B-12: 349 pg/mL (ref 232–1245)

## 2016-01-03 LAB — TSH: TSH: 4.74 u[IU]/mL — ABNORMAL HIGH (ref 0.450–4.500)

## 2016-01-03 LAB — HIV ANTIBODY (ROUTINE TESTING W REFLEX): HIV Screen 4th Generation wRfx: NONREACTIVE

## 2016-01-09 ENCOUNTER — Other Ambulatory Visit: Payer: Self-pay | Admitting: Family Medicine

## 2016-01-09 DIAGNOSIS — Z1231 Encounter for screening mammogram for malignant neoplasm of breast: Secondary | ICD-10-CM

## 2016-02-13 ENCOUNTER — Ambulatory Visit
Admission: RE | Admit: 2016-02-13 | Discharge: 2016-02-13 | Disposition: A | Discharge status: Home / Self Care | Payer: 59 | Source: Ambulatory Visit | Attending: Family Medicine | Admitting: Family Medicine

## 2016-02-13 DIAGNOSIS — Z1231 Encounter for screening mammogram for malignant neoplasm of breast: Secondary | ICD-10-CM | POA: Diagnosis not present

## 2016-03-24 MED FILL — GABAPENTIN 300 MG CAPSULE: 300 | 90 days supply | Qty: 180 | Fill #2

## 2016-05-29 DIAGNOSIS — R202 Paresthesia of skin: Secondary | ICD-10-CM | POA: Diagnosis not present

## 2016-05-29 DIAGNOSIS — L299 Pruritus, unspecified: Secondary | ICD-10-CM | POA: Diagnosis not present

## 2016-06-25 MED FILL — GABAPENTIN 300 MG CAPSULE: 300 | 90 days supply | Qty: 180 | Fill #0

## 2016-08-07 ENCOUNTER — Encounter: Payer: Self-pay | Admitting: Obstetrics & Gynecology

## 2016-08-07 ENCOUNTER — Ambulatory Visit (INDEPENDENT_AMBULATORY_CARE_PROVIDER_SITE_OTHER): Payer: 59 | Admitting: Obstetrics & Gynecology

## 2016-08-07 VITALS — BP 124/78 | Ht 64.0 in | Wt 152.0 lb

## 2016-08-07 DIAGNOSIS — Z01411 Encounter for gynecological examination (general) (routine) with abnormal findings: Secondary | ICD-10-CM

## 2016-08-07 DIAGNOSIS — Z780 Asymptomatic menopausal state: Secondary | ICD-10-CM

## 2016-08-07 NOTE — Progress Notes (Signed)
Kristina Flores [birth year] [id number]   History:    56 y.o. G4P3A1 Married.  [place] recently.  3 children in their upper 20's, doing well.  RP:  Established patient presenting for annual gyn exam   HPI:  Menopause.  No HRT.  No PMB.  No pelvic pain.  [place] for [place].  Breasts wnl.  Mictions/BMs wnl.  Past medical history,surgical history, family history and social history were all reviewed and documented in the EPIC chart.  Gynecologic History No LMP recorded. Patient is postmenopausal. Contraception: post menopausal status Last Pap: 07/2015. Results were: Normal/HPV HR neg Last mammogram: 01/2016. Results were: normal  Obstetric History OB History  Gravida Para Term Preterm AB Living  4 3     1 3   SAB TAB Ectopic Multiple Live Births               # Outcome Date GA Lbr Len/2nd Weight Sex Delivery Anes PTL Lv  4 AB           3 Para           2 Para           1 Para                ROS: A ROS was performed and pertinent positives and negatives are included in the history.  GENERAL: No fevers or chills. HEENT: No change in vision, no earache, sore throat or sinus congestion. NECK: No pain or stiffness. CARDIOVASCULAR: No chest pain or pressure. No palpitations. PULMONARY: No shortness of breath, cough or wheeze. GASTROINTESTINAL: No abdominal pain, nausea, vomiting or diarrhea, melena or bright red blood per rectum. GENITOURINARY: No urinary frequency, urgency, hesitancy or dysuria. MUSCULOSKELETAL: No joint or muscle pain, no back pain, no recent trauma. DERMATOLOGIC: No rash, no itching, no lesions. ENDOCRINE: No polyuria, polydipsia, no heat or cold intolerance. No recent change in weight. HEMATOLOGICAL: No anemia or easy bruising or bleeding. NEUROLOGIC: No headache, seizures, numbness, tingling or weakness. PSYCHIATRIC: No depression, no loss of interest in normal activity or change in sleep pattern.     Exam:   BP 124/78   Ht 5\' 4"  (1.626 m)   Wt 152 lb (68.9 kg)    BMI 26.09 kg/m   Body mass index is 26.09 kg/m.  General appearance : Well developed well nourished female. No acute distress HEENT: Eyes: no retinal hemorrhage or exudates,  Neck supple, trachea midline, no carotid bruits, no thyroidmegaly Lungs: Clear to auscultation, no rhonchi or wheezes, or rib retractions  Heart: Regular rate and rhythm, no murmurs or gallops Breast:Examined in sitting and supine position were symmetrical in appearance, no palpable masses or tenderness,  no skin retraction, no nipple inversion, no nipple discharge, no skin discoloration, no axillary or supraclavicular lymphadenopathy Abdomen: no palpable masses or tenderness, no rebound or guarding Extremities: no edema or skin discoloration or tenderness  Pelvic:  Bartholin, Urethra, Skene Glands: Within normal limits             Vagina: No gross lesions or discharge  Cervix: No gross lesions or discharge  Uterus  AV, normal size, shape and consistency, non-tender and mobile  Adnexa  Without masses or tenderness  Anus and perineum  normal     Assessment/Plan:  56 y.o. female for annual exam   1. Encounter for gynecological examination with abnormal finding Gyn exam with mild Atrophic Vaginitis of Menopause.  Pap wnl/HPV neg 07/2015, will repeat next year.  Breasts  wnl.  Mammo 01/2016 neg.  2. Menopause present No HRT.  No PMB.  Mild Atrophic Vaginitis of Menopause, using [place] for IC.  Vit D supplements/Ca++ rich nutrition and weight bearing physical activity.  F/U Bone Density.   [name], 8:47 AM 08/07/2016

## 2016-08-07 NOTE — Patient Instructions (Signed)
1. Encounter for gynecological examination with abnormal finding Gyn exam with mild Atrophic Vaginitis of Menopause.  Pap wnl/HPV neg 07/2015, will repeat next year.  Breasts wnl.  Mammo 01/2016 neg.  2. Menopause present No HRT.  No PMB.  Mild Atrophic Vaginitis of Menopause, using [place] for IC.  Vit D supplements/Ca++ rich nutrition and weight bearing physical activity.  F/U Bone Density.  Kristina Flores, it was a pleasure to see you today!  I will review your Bone Density as soon as available.   Health Maintenance for Postmenopausal Women Menopause is a normal process in which your reproductive ability comes to an end. This process happens gradually over a span of months to years, usually between the ages of 26 and 40. Menopause is complete when you have missed 12 consecutive menstrual periods. It is important to talk with your health care provider about some of the most common conditions that affect postmenopausal women, such as heart disease, cancer, and bone loss (osteoporosis). Adopting a healthy lifestyle and getting preventive care can help to promote your health and wellness. Those actions can also lower your chances of developing some of these common conditions. What should I know about menopause? During menopause, you may experience a number of symptoms, such as:  Moderate-to-severe hot flashes.  Night sweats.  Decrease in sex drive.  Mood swings.  Headaches.  Tiredness.  Irritability.  Memory problems.  Insomnia.  Choosing to treat or not to treat menopausal changes is an individual decision that you make with your health care provider. What should I know about hormone replacement therapy and supplements? Hormone therapy products are effective for treating symptoms that are associated with menopause, such as hot flashes and night sweats. Hormone replacement carries certain risks, especially as you become older. If you are thinking about using estrogen or estrogen with progestin  treatments, discuss the benefits and risks with your health care provider. What should I know about heart disease and stroke? Heart disease, heart attack, and stroke become more likely as you age. This may be due, in part, to the hormonal changes that your body experiences during menopause. These can affect how your body processes dietary fats, triglycerides, and cholesterol. Heart attack and stroke are both medical emergencies. There are many things that you can do to help prevent heart disease and stroke:  Have your blood pressure checked at least every 1-2 years. High blood pressure causes heart disease and increases the risk of stroke.  If you are 73-63 years old, ask your health care provider if you should take aspirin to prevent a heart attack or a stroke.  Do not use any tobacco products, including cigarettes, chewing tobacco, or electronic cigarettes. If you need help quitting, ask your health care provider.  It is important to eat a healthy diet and maintain a healthy weight. ? Be sure to include plenty of vegetables, fruits, low-fat dairy products, and lean protein. ? Avoid eating foods that are high in solid fats, added sugars, or salt (sodium).  Get regular exercise. This is one of the most important things that you can do for your health. ? Try to exercise for at least 150 minutes each week. The type of exercise that you do should increase your heart rate and make you sweat. This is known as moderate-intensity exercise. ? Try to do strengthening exercises at least twice each week. Do these in addition to the moderate-intensity exercise.  Know your numbers.Ask your health care provider to check your cholesterol and your blood  glucose. Continue to have your blood tested as directed by your health care provider.  What should I know about cancer screening? There are several types of cancer. Take the following steps to reduce your risk and to catch any cancer development as early as  possible. Breast Cancer  Practice breast self-awareness. ? This means understanding how your breasts normally appear and feel. ? It also means doing regular breast self-exams. Let your health care provider know about any changes, no matter how small.  If you are 21 or older, have a clinician do a breast exam (clinical breast exam or CBE) every year. Depending on your age, family history, and medical history, it may be recommended that you also have a yearly breast X-ray (mammogram).  If you have a family history of breast cancer, talk with your health care provider about genetic screening.  If you are at high risk for breast cancer, talk with your health care provider about having an MRI and a mammogram every year.  Breast cancer (BRCA) gene test is recommended for women who have family members with BRCA-related cancers. Results of the assessment will determine the need for genetic counseling and BRCA1 and for BRCA2 testing. BRCA-related cancers include these types: ? Breast. This occurs in males or females. ? Ovarian. ? Tubal. This may also be called fallopian tube cancer. ? Cancer of the abdominal or pelvic lining (peritoneal cancer). ? Prostate. ? Pancreatic.  Cervical, Uterine, and Ovarian Cancer Your health care provider may recommend that you be screened regularly for cancer of the pelvic organs. These include your ovaries, uterus, and vagina. This screening involves a pelvic exam, which includes checking for microscopic changes to the surface of your cervix (Pap test).  For women ages 21-65, health care providers may recommend a pelvic exam and a Pap test every three years. For women ages 78-65, they may recommend the Pap test and pelvic exam, combined with testing for human papilloma virus (HPV), every five years. Some types of HPV increase your risk of cervical cancer. Testing for HPV may also be done on women of any age who have unclear Pap test results.  Other health care  providers may not recommend any screening for nonpregnant women who are considered low risk for pelvic cancer and have no symptoms. Ask your health care provider if a screening pelvic exam is right for you.  If you have had past treatment for cervical cancer or a condition that could lead to cancer, you need Pap tests and screening for cancer for at least 20 years after your treatment. If Pap tests have been discontinued for you, your risk factors (such as having a new sexual partner) need to be reassessed to determine if you should start having screenings again. Some women have medical problems that increase the chance of getting cervical cancer. In these cases, your health care provider may recommend that you have screening and Pap tests more often.  If you have a family history of uterine cancer or ovarian cancer, talk with your health care provider about genetic screening.  If you have vaginal bleeding after reaching menopause, tell your health care provider.  There are currently no reliable tests available to screen for ovarian cancer.  Lung Cancer Lung cancer screening is recommended for adults 47-7 years old who are at high risk for lung cancer because of a history of smoking. A yearly low-dose CT scan of the lungs is recommended if you:  Currently smoke.  Have a history of at  least 30 pack-years of smoking and you currently smoke or have quit within the past 15 years. A pack-year is smoking an average of one pack of cigarettes per day for one year.  Yearly screening should:  Continue until it has been 15 years since you quit.  Stop if you develop a health problem that would prevent you from having lung cancer treatment.  Colorectal Cancer  This type of cancer can be detected and can often be prevented.  Routine colorectal cancer screening usually begins at age 19 and continues through age 40.  If you have risk factors for colon cancer, your health care provider may recommend  that you be screened at an earlier age.  If you have a family history of colorectal cancer, talk with your health care provider about genetic screening.  Your health care provider may also recommend using home test kits to check for hidden blood in your stool.  A small camera at the end of a tube can be used to examine your colon directly (sigmoidoscopy or colonoscopy). This is done to check for the earliest forms of colorectal cancer.  Direct examination of the colon should be repeated every 5-10 years until age 32. However, if early forms of precancerous polyps or small growths are found or if you have a family history or genetic risk for colorectal cancer, you may need to be screened more often.  Skin Cancer  Check your skin from head to toe regularly.  Monitor any moles. Be sure to tell your health care provider: ? About any new moles or changes in moles, especially if there is a change in a mole's shape or color. ? If you have a mole that is larger than the size of a pencil eraser.  If any of your family members has a history of skin cancer, especially at a young age, talk with your health care provider about genetic screening.  Always use sunscreen. Apply sunscreen liberally and repeatedly throughout the day.  Whenever you are outside, protect yourself by wearing long sleeves, pants, a wide-brimmed hat, and sunglasses.  What should I know about osteoporosis? Osteoporosis is a condition in which bone destruction happens more quickly than new bone creation. After menopause, you may be at an increased risk for osteoporosis. To help prevent osteoporosis or the bone fractures that can happen because of osteoporosis, the following is recommended:  If you are 32-22 years old, get at least 1,000 mg of calcium and at least 600 mg of vitamin D per day.  If you are older than age 44 but younger than age 3, get at least 1,200 mg of calcium and at least 600 mg of vitamin D per day.  If you  are older than age 73, get at least 1,200 mg of calcium and at least 800 mg of vitamin D per day.  Smoking and excessive alcohol intake increase the risk of osteoporosis. Eat foods that are rich in calcium and vitamin D, and do weight-bearing exercises several times each week as directed by your health care provider. What should I know about how menopause affects my mental health? Depression may occur at any age, but it is more common as you become older. Common symptoms of depression include:  Low or sad mood.  Changes in sleep patterns.  Changes in appetite or eating patterns.  Feeling an overall lack of motivation or enjoyment of activities that you previously enjoyed.  Frequent crying spells.  Talk with your health care provider if you  think that you are experiencing depression. What should I know about immunizations? It is important that you get and maintain your immunizations. These include:  Tetanus, diphtheria, and pertussis (Tdap) booster vaccine.  Influenza every year before the flu season begins.  Pneumonia vaccine.  Shingles vaccine.  Your health care provider may also recommend other immunizations. This information is not intended to replace advice given to you by your health care provider. Make sure you discuss any questions you have with your health care provider. Document Released: 02/21/2005 Document Revised: 07/20/2015 Document Reviewed: 10/03/2014 Elsevier Interactive Patient Education  2018 [employer].

## 2016-08-13 DIAGNOSIS — M8580 Other specified disorders of bone density and structure, unspecified site: Secondary | ICD-10-CM

## 2016-08-13 HISTORY — DX: Other specified disorders of bone density and structure, unspecified site: M85.80

## 2016-08-20 ENCOUNTER — Other Ambulatory Visit: Payer: Self-pay | Admitting: Gynecology

## 2016-08-20 DIAGNOSIS — Z13820 Encounter for screening for osteoporosis: Secondary | ICD-10-CM

## 2016-08-28 ENCOUNTER — Encounter: Payer: Self-pay | Admitting: Gynecology

## 2016-08-28 ENCOUNTER — Other Ambulatory Visit: Payer: Self-pay | Admitting: Gynecology

## 2016-08-28 ENCOUNTER — Ambulatory Visit (INDEPENDENT_AMBULATORY_CARE_PROVIDER_SITE_OTHER): Payer: 59

## 2016-08-28 DIAGNOSIS — Z13820 Encounter for screening for osteoporosis: Secondary | ICD-10-CM | POA: Diagnosis not present

## 2016-08-28 DIAGNOSIS — M85851 Other specified disorders of bone density and structure, right thigh: Secondary | ICD-10-CM | POA: Diagnosis not present

## 2016-09-29 MED FILL — GABAPENTIN 300 MG CAPSULE: 300 | 90 days supply | Qty: 180 | Fill #1

## 2016-10-20 ENCOUNTER — Ambulatory Visit (INDEPENDENT_AMBULATORY_CARE_PROVIDER_SITE_OTHER): Payer: 59 | Admitting: Physician Assistant

## 2016-10-20 ENCOUNTER — Encounter: Payer: Self-pay | Admitting: Physician Assistant

## 2016-10-20 VITALS — BP 108/72 | HR 71 | Temp 98.2°F | Resp 16 | Ht 64.0 in | Wt 150.0 lb

## 2016-10-20 DIAGNOSIS — B359 Dermatophytosis, unspecified: Secondary | ICD-10-CM | POA: Diagnosis not present

## 2016-10-20 MED ORDER — TERBINAFINE HCL 1 % EX CREA: 1.0000 "application " | TOPICAL_CREAM | Freq: Every day | CUTANEOUS | 0 refills | Status: DC

## 2016-10-20 NOTE — Progress Notes (Signed)
PRIMARY CARE AT [facility] [address], [place] [address] [phone]  Date:  10/20/2016   Name:  Kristina Flores   DOB:  1960/10/17   MRN:  [id number]  PCP:  Wardell Honour, MD    History of Present Illness:  Kristina Flores is a 56 y.o. female patient who presents to PCP with  Chief Complaint  Patient presents with  . Tinea    pt has spot on tigh that itches and is red.     Right inner thigh with red lesion that appeared about 1 week ago.  It is pruritic.  Not painful.  She notes no other lesion anywhere else.  No recent known contacts with rash.  She works in Air traffic controller.   No hx of eczema.   Patient Active Problem List   Diagnosis Date Noted  . 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 Nonallopathic lesion of lumbosacral region 03/02/2012  . Nonallopathic lesion of cervical region 03/02/2012  . DDD (degenerative disc disease), lumbar 09/20/2011  . Menopause 09/20/2011  . Allergic rhinitis 09/20/2011    Past Medical History:  Diagnosis Date  . Allergy   . Heart murmur   . Numbness and tingling   . Osteopenia 08/2016   T score -1.6 FRAX 7.8%/0.7%  . Peripheral neuropathy, idiopathic     Past Surgical History:  Procedure Laterality Date  . fibroid in 2011,2003    . miscarriage 1989    . ovarian cyst 1992      Social History  Substance Use Topics  . Smoking status: Never Smoker  . Smokeless tobacco: Never Used  . Alcohol use Yes    Family History  Problem Relation Age of Onset  . Cancer Mother        leukemia  . Colon cancer Father 27  . Cancer Father        prostate cancer, colon cancer    No Known Allergies  Medication list has been reviewed and updated.  Current Outpatient Prescriptions on File Prior to Visit  Medication Sig Dispense Refill  . Alpha-Lipoic Acid 600 MG CAPS Take by mouth.    . Calcium Carb-Cholecalciferol (CALCIUM 600 + D PO) Take 1 tablet by mouth daily.    .  cetirizine (ZYRTEC) 10 MG chewable tablet Chew 10 mg by mouth daily.    . Cholecalciferol 1000 UNITS capsule Take 1,000 Units by mouth daily.    . cyclobenzaprine (FLEXERIL) 10 MG tablet Take 10 mg by mouth at bedtime. As needed for back pain.    Marland Kitchen gabapentin (NEURONTIN) 100 MG capsule Take 100 mg by mouth as needed.    . meloxicam (MOBIC) 15 MG tablet Take 15 mg by mouth as needed.    . Omega-3 Fatty Acids (FISH OIL) 1000 MG CAPS Take 1 capsule by mouth daily.     No current facility-administered medications on file prior to visit.     ROS ROS otherwise unremarkable unless listed above.  Physical Examination: BP 108/72   Pulse 71   Temp 98.2 F (36.8 C) (Oral)   Resp 16   Ht 5\' 4"  (1.626 m)   Wt 150 lb (68 kg)   SpO2 98%   BMI 25.75 kg/m  Ideal Body Weight: Weight in (lb) to have BMI = 25: 145.3  Physical Exam  Constitutional: She is oriented to person, place, and time. She appears well-developed and well-nourished. No distress.  HENT:  Head: Normocephalic and atraumatic.  Right  Ear: External ear normal.  Left Ear: External ear normal.  Eyes: Pupils are equal, round, and reactive to light. Conjunctivae and EOM are normal.  Cardiovascular: Normal rate.   Pulmonary/Chest: Effort normal. No respiratory distress.  Neurological: She is alert and oriented to person, place, and time.  Skin: She is not diaphoretic.  Right inner thigh annular lesion with erythematous scaling raised border, and mildly less erythemaotus center.  Non-tender.    Psychiatric: She has a normal mood and affect. Her behavior is normal.     Assessment and Plan: Kristina Flores is a 56 y.o. female who is here today for cc of  Chief Complaint  Patient presents with  . Tinea    pt has spot on tigh that itches and is red.   Ringworm - Plan: terbinafine (LAMISIL) 1 % cream  Ivar Drape, PA-C [facility] 10/9/201810:32 AM

## 2016-10-20 NOTE — Patient Instructions (Addendum)
Body Ringworm Body ringworm is an infection of the skin that often causes a ring-shaped rash. Body ringworm can affect any part of your skin. It can spread easily to others. Body ringworm is also called tinea corporis. What are the causes? This condition is caused by funguses called dermatophytes. The condition develops when these funguses grow out of control on the skin. You can get this condition if you touch a person or animal that has it. You can also get it if you share clothing, bedding, towels, or any other object with an infected person or pet. What increases the risk? This condition is more likely to develop in:  Athletes who often make skin-to-skin contact with other athletes, such as wrestlers.  People who share equipment and mats.  People with a weakened immune system.  What are the signs or symptoms? Symptoms of this condition include:  Itchy, raised red spots and bumps.  Red scaly patches.  A ring-shaped rash. The rash may have: ? A clear center. ? Scales or red bumps at its center. ? Redness near its borders. ? Dry and scaly skin on or around it.  How is this diagnosed? This condition can usually be diagnosed with a skin exam. A skin scraping may be taken from the affected area and examined under a microscope to see if the fungus is present. How is this treated? This condition may be treated with:  An antifungal cream or ointment.  An antifungal shampoo.  Antifungal medicines. These may be prescribed if your ringworm is severe, keeps coming back, or lasts a long time.  Follow these instructions at home:  Take over-the-counter and prescription medicines only as told by your health care provider.  If you were given an antifungal cream or ointment: ? Use it as told by your health care provider. ? Wash the infected area and dry it completely before applying the cream or ointment.  If you were given an antifungal shampoo: ? Use it as told by your health care  provider. ? Leave the shampoo on your body for 3-5 minutes before rinsing.  While you have a rash: ? Wear loose clothing to stop clothes from rubbing and irritating it. ? Wash or change your bed sheets every night.  If your pet has the same infection, take your pet to see a veterinarian. How is this prevented?  Practice good hygiene.  Wear sandals or shoes in public places and showers.  Do not share personal items with others.  Avoid touching red patches of skin on other people.  Avoid touching pets that have bald spots.  If you touch an animal that has a bald spot, wash your hands. Contact a health care provider if:  Your rash continues to spread after 7 days of treatment.  Your rash is not gone in 4 weeks.  The area around your rash gets red, warm, tender, and swollen. This information is not intended to replace advice given to you by your health care provider. Make sure you discuss any questions you have with your health care provider. Document Released: 12/28/1999 Document Revised: 06/07/2015 Document Reviewed: 10/26/2014 Elsevier Interactive Patient Education  2018 [employer]     IF you received an x-ray today, you will receive an invoice from [facility]. Please contact [facility] at [phone] with questions or concerns regarding your invoice.   IF you received labwork today, you will receive an invoice from [facility]. Please contact [facility] at [phone] with questions or concerns regarding your invoice.     Our billing staff will not be able to assist you with questions regarding bills from these companies.  You will be contacted with the lab results as soon as they are available. The fastest way to get your results is to activate your My Chart account. Instructions are located on the last page of this paperwork. If you have not heard from [place] regarding the results in 2 weeks, please contact this office.

## 2016-11-12 ENCOUNTER — Telehealth: Payer: Self-pay | Admitting: Family Medicine

## 2016-11-12 NOTE — Telephone Encounter (Signed)
PATIENT WOULD LIKE TO ASK DR. Tamala Julian IF SHE WILL PRESCRIBE HER TO  HAVE MELOXICAM (MOBIC) 15 MG FOR HER PLANTAR FASCITIS? IT WAS ORIGINALLY GIVEN TO HER BY DR. Christia Reading DRAPER. SHE SAID SHE HAS NOT SEEN HIM IN A WHILE AND SEES DR. Tamala Julian MORE OFTEN. (I SUGGESTED SHE WILL PROBABLY NEED TO MAKE AN APPOINTMENT WITH DR. Tamala Julian, BUT SHE WANTED TO LEAVE A PHONE MESSAGE FIRST).  BEST PHONE [phone] (CELL) PHARMACY CHOICE IS [facility]. [place]

## 2016-11-14 NOTE — Telephone Encounter (Signed)
Please advise 

## 2016-12-15 ENCOUNTER — Ambulatory Visit: Payer: 59 | Admitting: Sports Medicine

## 2016-12-15 ENCOUNTER — Encounter: Payer: Self-pay | Admitting: Sports Medicine

## 2016-12-15 VITALS — BP 130/81 | Ht 64.0 in | Wt 150.0 lb

## 2016-12-15 DIAGNOSIS — M722 Plantar fascial fibromatosis: Secondary | ICD-10-CM | POA: Diagnosis not present

## 2016-12-15 MED ORDER — CYCLOBENZAPRINE HCL 10 MG PO TABS: 10.0000 mg | ORAL_TABLET | Freq: Every day | ORAL | 0 refills | Status: DC

## 2016-12-15 MED ORDER — MELOXICAM 15 MG PO TABS: 15.0000 mg | ORAL_TABLET | ORAL | 0 refills | Status: DC | PRN

## 2016-12-15 NOTE — Progress Notes (Signed)
   Subjective:    Patient ID: Kristina Flores, female    DOB: September 22, 1960, 56 y.o.   MRN: [id number]  HPI [name] is a 56 year old female with past medical history significant for plantar fasciitis on the left who presents with left heel pain.  Patient states that since this past spring she has been having episodic heel pain on the left.  Patient states that she experiences an ache in the mid substance of the heel that radiates to the left.  She is tried ibuprofen and this seems to help minimally.  Pain is worse when she has prolonged periods of standing or has to do any significant amount of ambulation.  Pain is worse with heel strike.  She is tried getting new insoles for shoes but this is not decrease the pain significantly.  The denies any inciting trauma.  Review of Systems Constitutional: Negative. Musculoskeletal: +L heel pain  Objective:   Physical Exam Gen.:Alert and oriented, no acute distress Eyes: EOMI, normal conjunctiva Respiratory:  respirations are nonlabored, breath sounds are equal, symmetric chest wall expansion Cardiovascular: normal peripheral perfusion Musculoskeletal: No obvious deformity, tenderness to palpation to the mid substance of the calcaneus and approximately 2-3 mm laterally from that location, full range of motion of the ankle joint, 5 out of 5 strength with dorsiflexion and plantar flexion, negative calcaneal squeeze, neurovascularly intact distally Skin: Warm, dry   MSK ultrasound of the left foot was performed. Limited images were obtained. Plantar fascia was found to have some hypoechoic changes and measures 0.43 cm.    Assessment & Plan:  #Left plantar fasciitis -Patient with signs and symptoms consistent with left plantar fasciitis  -We will start treatment conservatively with plantar stretches and exercises. -Have asked the patient to return to clinic with her walking shoes and at that time will evaluate for need for arch support.  She may ice her heel  for 10-15 minutes at a time twice daily as needed. -RTC 4 weeks after she brings her shoes in for for evaluation of need of our support  Plan of care discussed with Dr. Micheline Chapman who agrees with plan.  Zella Richer, MD  Patient seen and evaluated with the resident. I agree with the above plan of care. Patient will start plantar fascial stretches and daily icing. I would like for her to return to the office with her walking shoes so that I can evaluate her gait. She may need additional arch support.

## 2016-12-16 MED FILL — CYCLOBENZAPRINE 10 MG TAB: 10 | 20 days supply | Qty: 20 | Fill #0

## 2016-12-16 MED FILL — MELOXICAM 15 MG TABLET: 15 | 40 days supply | Qty: 40 | Fill #0

## 2016-12-26 MED FILL — GABAPENTIN 300 MG CAPS: 300 | 90 days supply | Qty: 180 | Fill #2

## 2017-01-16 ENCOUNTER — Other Ambulatory Visit: Payer: Self-pay | Admitting: Family Medicine

## 2017-01-16 DIAGNOSIS — Z1231 Encounter for screening mammogram for malignant neoplasm of breast: Secondary | ICD-10-CM

## 2017-01-21 ENCOUNTER — Encounter: Payer: 59 | Admitting: Family Medicine

## 2017-01-21 DIAGNOSIS — Z23 Encounter for immunization: Secondary | ICD-10-CM | POA: Diagnosis not present

## 2017-01-21 DIAGNOSIS — L821 Other seborrheic keratosis: Secondary | ICD-10-CM | POA: Diagnosis not present

## 2017-01-21 DIAGNOSIS — L814 Other melanin hyperpigmentation: Secondary | ICD-10-CM | POA: Diagnosis not present

## 2017-01-21 DIAGNOSIS — D225 Melanocytic nevi of trunk: Secondary | ICD-10-CM | POA: Diagnosis not present

## 2017-01-21 DIAGNOSIS — D1800 Hemangioma unspecified site: Secondary | ICD-10-CM | POA: Diagnosis not present

## 2017-01-28 ENCOUNTER — Encounter: Payer: 59 | Admitting: Family Medicine

## 2017-02-16 ENCOUNTER — Other Ambulatory Visit: Payer: Self-pay

## 2017-02-16 ENCOUNTER — Ambulatory Visit (INDEPENDENT_AMBULATORY_CARE_PROVIDER_SITE_OTHER): Payer: 59 | Admitting: Family Medicine

## 2017-02-16 ENCOUNTER — Encounter: Payer: Self-pay | Admitting: Family Medicine

## 2017-02-16 VITALS — BP 122/72 | HR 71 | Temp 98.0°F | Resp 16 | Ht 63.78 in | Wt 154.0 lb

## 2017-02-16 DIAGNOSIS — J301 Allergic rhinitis due to pollen: Secondary | ICD-10-CM

## 2017-02-16 DIAGNOSIS — R946 Abnormal results of thyroid function studies: Secondary | ICD-10-CM | POA: Diagnosis not present

## 2017-02-16 DIAGNOSIS — D708 Other neutropenia: Secondary | ICD-10-CM | POA: Diagnosis not present

## 2017-02-16 DIAGNOSIS — Z0000 Encounter for general adult medical examination without abnormal findings: Secondary | ICD-10-CM | POA: Diagnosis not present

## 2017-02-16 DIAGNOSIS — Z13220 Encounter for screening for lipoid disorders: Secondary | ICD-10-CM | POA: Diagnosis not present

## 2017-02-16 DIAGNOSIS — Z131 Encounter for screening for diabetes mellitus: Secondary | ICD-10-CM

## 2017-02-16 DIAGNOSIS — R208 Other disturbances of skin sensation: Secondary | ICD-10-CM | POA: Diagnosis not present

## 2017-02-16 DIAGNOSIS — Z780 Asymptomatic menopausal state: Secondary | ICD-10-CM

## 2017-02-16 DIAGNOSIS — M5136 Other intervertebral disc degeneration, lumbar region: Secondary | ICD-10-CM | POA: Diagnosis not present

## 2017-02-16 DIAGNOSIS — M999 Biomechanical lesion, unspecified: Secondary | ICD-10-CM

## 2017-02-16 DIAGNOSIS — G609 Hereditary and idiopathic neuropathy, unspecified: Secondary | ICD-10-CM

## 2017-02-16 LAB — POCT URINALYSIS DIP (MANUAL ENTRY)
Bilirubin, UA: NEGATIVE
Blood, UA: NEGATIVE
Glucose, UA: NEGATIVE mg/dL
Ketones, POC UA: NEGATIVE mg/dL
Leukocytes, UA: NEGATIVE
Nitrite, UA: NEGATIVE
Protein Ur, POC: NEGATIVE mg/dL
Spec Grav, UA: 1.01 (ref 1.010–1.025)
Urobilinogen, UA: 0.2 E.U./dL
pH, UA: 7 (ref 5.0–8.0)

## 2017-02-16 NOTE — Patient Instructions (Addendum)
IF you received an x-ray today, you will receive an invoice from [facility]. Please contact [facility] at [phone] with questions or concerns regarding your invoice.   IF you received labwork today, you will receive an invoice from [facility]. Please contact [facility] at [phone] with questions or concerns regarding your invoice.   Our billing staff will not be able to assist you with questions regarding bills from these companies.  You will be contacted with the lab results as soon as they are available. The fastest way to get your results is to activate your My Chart account. Instructions are located on the last page of this paperwork. If you have not heard from [place] regarding the results in 2 weeks, please contact this office.      Preventive Care 40-64 Years, Female Preventive care refers to lifestyle choices and visits with your health care provider that can promote health and wellness. What does preventive care include?  A yearly physical exam. This is also called an annual well check.  Dental exams once or twice a year.  Routine eye exams. Ask your health care provider how often you should have your eyes checked.  Personal lifestyle choices, including: ? Daily care of your teeth and gums. ? Regular physical activity. ? Eating a healthy diet. ? Avoiding tobacco and drug use. ? Limiting alcohol use. ? Practicing safe sex. ? Taking low-dose aspirin daily starting at age 82. ? Taking vitamin and mineral supplements as recommended by your health care provider. What happens during an annual well check? The services and screenings done by your health care provider during your annual well check will depend on your age, overall health, lifestyle risk factors, and family history of disease. Counseling Your health care provider may ask you questions about your:  Alcohol use.  Tobacco use.  Drug use.  Emotional well-being.  Home and relationship  well-being.  Sexual activity.  Eating habits.  Work and work Statistician.  Method of birth control.  Menstrual cycle.  Pregnancy history.  Screening You may have the following tests or measurements:  Height, weight, and BMI.  Blood pressure.  Lipid and cholesterol levels. These may be checked every 5 years, or more frequently if you are over 29 years old.  Skin check.  Lung cancer screening. You may have this screening every year starting at age 21 if you have a 30-pack-year history of smoking and currently smoke or have quit within the past 15 years.  Fecal occult blood test (FOBT) of the stool. You may have this test every year starting at age 85.  Flexible sigmoidoscopy or colonoscopy. You may have a sigmoidoscopy every 5 years or a colonoscopy every 10 years starting at age 36.  Hepatitis C blood test.  Hepatitis B blood test.  Sexually transmitted disease (STD) testing.  Diabetes screening. This is done by checking your blood sugar (glucose) after you have not eaten for a while (fasting). You may have this done every 1-3 years.  Mammogram. This may be done every 1-2 years. Talk to your health care provider about when you should start having regular mammograms. This may depend on whether you have a family history of breast cancer.  BRCA-related cancer screening. This may be done if you have a family history of breast, ovarian, tubal, or peritoneal cancers.  Pelvic exam and Pap test. This may be done every 3 years starting at age 2. Starting at age 59, this may be done every 5 years if  you have a Pap test in combination with an HPV test.  Bone density scan. This is done to screen for osteoporosis. You may have this scan if you are at high risk for osteoporosis.  Discuss your test results, treatment options, and if necessary, the need for more tests with your health care provider. Vaccines Your health care provider may recommend certain vaccines, such  as:  Influenza vaccine. This is recommended every year.  Tetanus, diphtheria, and acellular pertussis (Tdap, Td) vaccine. You may need a Td booster every 10 years.  Varicella vaccine. You may need this if you have not been vaccinated.  Zoster vaccine. You may need this after age 45.  Measles, mumps, and rubella (MMR) vaccine. You may need at least one dose of MMR if you were born in 1957 or later. You may also need a second dose.  Pneumococcal 13-valent conjugate (PCV13) vaccine. You may need this if you have certain conditions and were not previously vaccinated.  Pneumococcal polysaccharide (PPSV23) vaccine. You may need one or two doses if you smoke cigarettes or if you have certain conditions.  Meningococcal vaccine. You may need this if you have certain conditions.  Hepatitis A vaccine. You may need this if you have certain conditions or if you travel or work in places where you may be exposed to hepatitis A.  Hepatitis B vaccine. You may need this if you have certain conditions or if you travel or work in places where you may be exposed to hepatitis B.  Haemophilus influenzae type b (Hib) vaccine. You may need this if you have certain conditions.  Talk to your health care provider about which screenings and vaccines you need and how often you need them. This information is not intended to replace advice given to you by your health care provider. Make sure you discuss any questions you have with your health care provider. Document Released: 01/26/2015 Document Revised: 09/19/2015 Document Reviewed: 10/31/2014 Elsevier Interactive Patient Education  [employer].

## 2017-02-16 NOTE — Progress Notes (Signed)
Subjective:    Patient ID: Kristina Flores, female    DOB: [birth year], 57 y.o.   MRN: [id number]  02/16/2017  Annual Exam    HPI This 57 y.o. female presents for evaluation for Routine Physical Examination.  Last physical:  01-02-2916 Pap smear:   [place] on 07/2016 visit; 07-2014+ Mammogram:  02-13-2016 Colonoscopy:  08/31/2013 Bone density:  08-28-2016 Eye exam:  2017; glasses; healthy Dental exam:  Every six months.   Visual Acuity Screening   Right eye Left eye Both eyes  Without correction:     With correction: 20/20 20/20 20/20     BP Readings from Last 3 Encounters:  02/16/17 122/72  12/15/16 130/81  10/20/16 108/72   Wt Readings from Last 3 Encounters:  02/16/17 154 lb (69.9 kg)  12/15/16 150 lb (68 kg)  10/20/16 150 lb (68 kg)   Immunization History  Administered Date(s) Administered  . Influenza Split 10/31/2011, 11/02/2013, 09/14/2014  . Influenza-Unspecified 10/14/2015, 10/13/2016  . Tdap 03/14/2014  . Zoster 01/13/2014   Health Maintenance  Topic Date Due  . PAP SMEAR  07/13/2017  . MAMMOGRAM  02/12/2018  . COLONOSCOPY  09/01/2018  . TETANUS/TDAP  03/13/2024  . INFLUENZA VACCINE  Completed  . Hepatitis C Screening  Completed  . HIV Screening  Completed   Review of Systems  Constitutional: Negative for activity change, appetite change, chills, diaphoresis, fatigue, fever and unexpected weight change.  HENT: Negative for congestion, dental problem, drooling, ear discharge, ear pain, facial swelling, hearing loss, mouth sores, nosebleeds, postnasal drip, rhinorrhea, sinus pressure, sneezing, sore throat, tinnitus, trouble swallowing and voice change.   Eyes: Negative for photophobia, pain, discharge, redness, itching and visual disturbance.  Respiratory: Negative for apnea, cough, choking, chest tightness, shortness of breath, wheezing and stridor.        Snores.  Feels freshed.  No naps.  Cardiovascular: Negative for chest pain, palpitations and leg  swelling.  Gastrointestinal: Negative for abdominal distention, abdominal pain, anal bleeding, blood in stool, constipation, diarrhea, nausea, rectal pain and vomiting.  Endocrine: Negative for cold intolerance, heat intolerance, polydipsia, polyphagia and polyuria.  Genitourinary: Negative for decreased urine volume, difficulty urinating, dyspareunia, dysuria, enuresis, flank pain, frequency, genital sores, hematuria, menstrual problem, pelvic pain, urgency, vaginal bleeding, vaginal discharge and vaginal pain.       Nocturia x 2.  No urinary leakage.  Musculoskeletal: Negative for arthralgias, back pain, gait problem, joint swelling, myalgias, neck pain and neck stiffness.  Skin: Positive for color change. Negative for pallor, rash and wound.  Allergic/Immunologic: Negative for environmental allergies, food allergies and immunocompromised state.  Neurological: Negative for dizziness, tremors, seizures, syncope, facial asymmetry, speech difficulty, weakness, light-headedness, numbness and headaches.  Hematological: Negative for adenopathy. Does not bruise/bleed easily.  Psychiatric/Behavioral: Negative for agitation, behavioral problems, confusion, decreased concentration, dysphoric mood, hallucinations, self-injury, sleep disturbance and suicidal ideas. The patient is not nervous/anxious and is not hyperactive.        Bedtime 1000; wakes up at 630.    Past Medical History:  Diagnosis Date  . Allergy   . Heart murmur   . Numbness and tingling   . Osteopenia 08/2016   T score -1.6 FRAX 7.8%/0.7%  . Peripheral neuropathy, idiopathic    Past Surgical History:  Procedure Laterality Date  . fibroid in 2011,2003    . miscarriage 1989    . ovarian cyst 1992     No Known Allergies Current Outpatient Medications on File Prior to Visit  Medication Sig Dispense Refill  .  Alpha-Lipoic Acid 600 MG CAPS Take by mouth.    . Calcium Carb-Cholecalciferol (CALCIUM 600 + D PO) Take 1 tablet by mouth  daily.    . cetirizine (ZYRTEC) 10 MG chewable tablet Chew 10 mg by mouth daily.    . Cholecalciferol 1000 UNITS capsule Take 1,000 Units by mouth daily.    . cyclobenzaprine (FLEXERIL) 10 MG tablet Take 1 tablet (10 mg total) by mouth at bedtime. As needed for back pain. 20 tablet 0  . gabapentin (NEURONTIN) 300 MG capsule Take 300 mg by mouth 2 (two) times daily.    . meloxicam (MOBIC) 15 MG tablet Take 1 tablet (15 mg total) by mouth as needed. 40 tablet 0  . Omega-3 Fatty Acids (FISH OIL) 1000 MG CAPS Take 1 capsule by mouth daily.     No current facility-administered medications on file prior to visit.    Social History   Socioeconomic History  . Marital status: Married    Spouse name: [name]  . Number of children: 3  . Years of education: College  . Highest education level: Not on file  Social Needs  . Financial resource strain: Not on file  . Food insecurity - worry: Not on file  . Food insecurity - inability: Not on file  . Transportation needs - medical: Not on file  . Transportation needs - non-medical: Not on file  Occupational History  . Occupation: PRESCHOOL Product manager: [employer]  Tobacco Use  . Smoking status: Never Smoker  . Smokeless tobacco: Never Used  Substance and Sexual Activity  . Alcohol use: Yes  . Drug use: No  . Sexual activity: Yes    Birth control/protection: Post-menopausal  Other Topics Concern  . Not on file  Social History Narrative   Patient is married [name]) and lives at home with her husband. Married x 36 years.   Patient has three adult children; 2 in [place] (30, 95); one in [place] (25).  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   Patient has a college education.   Patient is right-handed.   Patient drinks one cup of coffee daily.   Pillates three times per week for sixteen years.     Family History  Problem Relation Age of Onset  . Cancer Mother         leukemia  . Colon cancer Father 60  . Cancer Father        prostate cancer, colon cancer  . Arthritis Father        Objective:    BP 122/72   Pulse 71   Temp 98 F (36.7 C) (Oral)   Resp 16   Ht 5' 3.78" (1.62 m)   Wt 154 lb (69.9 kg)   SpO2 98%   BMI 26.62 kg/m  Physical Exam  Constitutional: She is oriented to person, place, and time. She appears well-developed and well-nourished. No distress.  HENT:  Head: Normocephalic and atraumatic.  Right Ear: Hearing, tympanic membrane, external ear and ear canal normal.  Left Ear: Hearing, tympanic membrane, external ear and ear canal normal.  Nose: Nose normal.  Mouth/Throat: Oropharynx is clear and moist.  Eyes: Conjunctivae and EOM are normal. Pupils are equal, round, and reactive to light.  Neck: Normal range of motion and full passive range of motion without pain. Neck supple. No JVD present. Carotid bruit is not present. No thyromegaly present.  Cardiovascular: Normal rate, regular rhythm,  normal heart sounds and intact distal pulses. Exam reveals no gallop and no friction rub.  No murmur heard. Pulmonary/Chest: Effort normal and breath sounds normal. No respiratory distress. She has no wheezes. She has no rales. Right breast exhibits no inverted nipple, no mass, no nipple discharge, no skin change and no tenderness. Left breast exhibits no inverted nipple, no mass, no nipple discharge, no skin change and no tenderness. Breasts are symmetrical.  Abdominal: Soft. Bowel sounds are normal. She exhibits no distension and no mass. There is no tenderness. There is no rebound and no guarding.  Musculoskeletal:       Right shoulder: Normal.       Left shoulder: Normal.       Cervical back: Normal.  Lymphadenopathy:    She has no cervical adenopathy.  Neurological: She is alert and oriented to person, place, and time. She has normal reflexes. No cranial nerve deficit. She exhibits normal muscle tone. Coordination normal.  Skin:  Skin is warm and dry. No rash noted. She is not diaphoretic. No erythema. No pallor.  Psychiatric: She has a normal mood and affect. Her behavior is normal. Judgment and thought content normal.  Nursing note and vitals reviewed.  No results found. Depression screen [facility] 2/9 02/16/2017 01/02/2016 07/06/2015 11/21/2014  Decreased Interest 0 0 0 0  Down, Depressed, Hopeless 0 0 0 0  PHQ - 2 Score 0 0 0 0   Fall Risk  02/16/2017 01/02/2016 07/06/2015  Falls in the past year? No No No        Assessment & Plan:   1. Routine physical examination   2. Seasonal allergic rhinitis due to pollen   3. DDD (degenerative disc disease), lumbar   4. Menopause   5. Nonallopathic lesion of cervical region   6. Nonallopathic lesion of lumbosacral region   7. Sensory abnormality of lumbar dermatome distribution   8. Idiopathic neuropathy   9. Thyroid function test abnormal   10. Other neutropenia ([place])   11. Screening for diabetes mellitus   12. Screening, lipid     -anticipatory guidance provided --- exercise, weight loss, safe driving practices, aspirin 81mg  daily. -obtain age appropriate screening labs and labs for chronic disease management. -TSH 4.5 last year; repeat this year. -WBC 2.1 last year; repeat this year.   Orders Placed This Encounter  Procedures  . CBC with Differential/Platelet  . Comprehensive metabolic panel    Order Specific Question:   Has the patient fasted?    Answer:   No  . Lipid panel    Order Specific Question:   Has the patient fasted?    Answer:   No  . TSH  . T4, free  . Hemoglobin A1c  . POCT urinalysis dipstick   No orders of the defined types were placed in this encounter.   Return in about 1 year (around 02/16/2018) for complete physical examiniation.   Aniyia Rane Elayne Guerin, M.D. Primary Care at [facility] previously [facility] [place] [address] [place], [place]  [address] [phone] phone [phone] fax

## 2017-02-17 ENCOUNTER — Ambulatory Visit: Payer: 59 | Admitting: Sports Medicine

## 2017-02-17 ENCOUNTER — Ambulatory Visit
Admission: RE | Admit: 2017-02-17 | Discharge: 2017-02-17 | Disposition: A | Discharge status: Home / Self Care | Payer: 59 | Source: Ambulatory Visit | Attending: Family Medicine | Admitting: Family Medicine

## 2017-02-17 ENCOUNTER — Ambulatory Visit
Admission: RE | Admit: 2017-02-17 | Discharge: 2017-02-17 | Disposition: A | Discharge status: Home / Self Care | Payer: 59 | Source: Ambulatory Visit | Attending: Sports Medicine | Admitting: Sports Medicine

## 2017-02-17 VITALS — BP 108/78 | Ht 64.0 in | Wt 150.0 lb

## 2017-02-17 DIAGNOSIS — Z1231 Encounter for screening mammogram for malignant neoplasm of breast: Secondary | ICD-10-CM | POA: Diagnosis not present

## 2017-02-17 DIAGNOSIS — M722 Plantar fascial fibromatosis: Secondary | ICD-10-CM

## 2017-02-17 DIAGNOSIS — M79672 Pain in left foot: Secondary | ICD-10-CM | POA: Diagnosis not present

## 2017-02-17 LAB — CBC WITH DIFFERENTIAL/PLATELET
Basophils Absolute: 0 10*3/uL (ref 0.0–0.2)
Basos: 0 %
EOS (ABSOLUTE): 0.1 10*3/uL (ref 0.0–0.4)
Eos: 2 %
Hematocrit: 37.2 % (ref 34.0–46.6)
Hemoglobin: 12.2 g/dL (ref 11.1–15.9)
Immature Grans (Abs): 0 10*3/uL (ref 0.0–0.1)
Immature Granulocytes: 0 %
Lymphocytes Absolute: 1.7 10*3/uL (ref 0.7–3.1)
Lymphs: 36 %
MCH: 30 pg (ref 26.6–33.0)
MCHC: 32.8 g/dL (ref 31.5–35.7)
MCV: 92 fL (ref 79–97)
Monocytes Absolute: 0.2 10*3/uL (ref 0.1–0.9)
Monocytes: 5 %
Neutrophils Absolute: 2.7 10*3/uL (ref 1.4–7.0)
Neutrophils: 57 %
Platelets: 280 10*3/uL (ref 150–379)
RBC: 4.06 x10E6/uL (ref 3.77–5.28)
RDW: 14 % (ref 12.3–15.4)
WBC: 4.8 10*3/uL (ref 3.4–10.8)

## 2017-02-17 LAB — COMPREHENSIVE METABOLIC PANEL
ALT: 15 IU/L (ref 0–32)
AST: 23 IU/L (ref 0–40)
Albumin/Globulin Ratio: 2.1 (ref 1.2–2.2)
Albumin: 4.7 g/dL (ref 3.5–5.5)
Alkaline Phosphatase: 59 IU/L (ref 39–117)
BUN/Creatinine Ratio: 16 (ref 9–23)
BUN: 15 mg/dL (ref 6–24)
Bilirubin Total: <0.2 mg/dL (ref 0.0–1.2)
CO2: 26 mmol/L (ref 20–29)
Calcium: 9.5 mg/dL (ref 8.7–10.2)
Chloride: 103 mmol/L (ref 96–106)
Creatinine, Ser: 0.95 mg/dL (ref 0.57–1.00)
GFR calc Af Amer: 76 mL/min/{1.73_m2} (ref >59)
GFR calc non Af Amer: 66 mL/min/{1.73_m2} (ref >59)
Globulin, Total: 2.2 g/dL (ref 1.5–4.5)
Glucose: 87 mg/dL (ref 65–99)
Potassium: 4.4 mmol/L (ref 3.5–5.2)
Sodium: 143 mmol/L (ref 134–144)
Total Protein: 6.9 g/dL (ref 6.0–8.5)

## 2017-02-17 LAB — LIPID PANEL
Chol/HDL Ratio: 2 ratio (ref 0.0–4.4)
Cholesterol, Total: 187 mg/dL (ref 100–199)
HDL: 94 mg/dL (ref >39)
LDL Calculated: 79 mg/dL (ref 0–99)
Triglycerides: 69 mg/dL (ref 0–149)
VLDL Cholesterol Cal: 14 mg/dL (ref 5–40)

## 2017-02-17 LAB — HEMOGLOBIN A1C
Est. average glucose Bld gHb Est-mCnc: 105 mg/dL
Hgb A1c MFr Bld: 5.3 % (ref 4.8–5.6)

## 2017-02-17 LAB — T4, FREE: Free T4: 1.08 ng/dL (ref 0.82–1.77)

## 2017-02-17 LAB — TSH: TSH: 1.75 u[IU]/mL (ref 0.450–4.500)

## 2017-02-17 NOTE — Progress Notes (Signed)
Subjective:    Patient ID: Kristina Flores, female    DOB: [birth year], 57 y.o.   MRN: [id number]  HPI  Kristina Flores is a 57yo F presenting for follow up of L plantar fasciitis.   Patient last seen for this issue on 12/15/16. At that time, was instructed to begin daily stretching exercises, as well as ice her foot and take NSAIDs PRN. Since that time, symptoms have worsened. She has been stretching most days, although she says it causes a significant amount of pain. Has been icing her foot regularly which provides relief while icing, but immediately begins to limp again after she removes foot from ice bath. Took Mobic daily for 1 wk with no improvement in symptoms, so stopped taking. Was previously walking for exercise, however is no longer doing this as it is too painful. Pain is constant throughout the day and does not improve as the day goes on. Is now limping even with normal walking. Says that area of pain is "larger and wider." Denies swelling.  Also reports a sharp pain in her L medial heel. Has had a similar pain in her L great toe for a while, but pain in the heel is new. Describes pain as feeling like a pin is sticking into her foot. Occurs intermittently and very briefly. Typically occurs at night. No pain between great toe and heel.  Of note, patient has been wearing insoles that she purchased at the [employer]. Says that she was told her foot turns out when she walks, so she was given lateral food wedges to try to prevent that. Started using these insoles around the time that her pain began last spring.   Review of Systems MSK: Denies numbness, tingling, weakness of L foot.     Objective:   Physical Exam  Constitutional: She is oriented to person, place, and time. She appears well-developed and well-nourished. No distress.  Musculoskeletal:  L foot: Pain with calcaneal squeeze. TTP over calcaneus. No swelling noted.   Neutral gait.   Neurological: She is alert and oriented to person,  place, and time.  Psychiatric: She has a normal mood and affect. Her behavior is normal.      Assessment & Plan:  Plantar fasciitis of left foot No improvement with stretching, icing, and NSAIDs. As such, need to rule out other possible etiologies. Concern for possible calcaneal stress fracture, given constant pain, no improvement with conservative measures, and pain with calcaneal squeeze. As such, will obtain plain films to rule out. As no supination noted on gait analysis, encouraged patient to stop using the insoles she has been wearing, and instead provided green insoles with scaphoid pads. Will also add calf stretches to the stretching regimen she is already performing. Discussed PT, however patient not wishing to proceed with that at this time. If calcaneal stress fracture noted on XR, will proceed with non-weightbearing x6 wks. Otherwise, f/u in 4 wks.  Kristina Hector, MD, MPH PGY-3 [facility] Pager [phone]  Patient seen and evaluated with the resident. I agree with the above plan of care. X-rays reviewed. There is a moderate calcaneal spur on the plantar aspect of the foot. There is also degenerative changes of the dorsal navicular (I think this is an incidental finding). Proceed with treatment as above. I'm going to remove her current inserts with lateral heel wedges. I don't see a lot of supination in her gait and I wonder whether or not these inserts are making her worse.  We will instead try a simple green sports insole with scaphoid pad. If this is comfortable then we can consider custom orthotics down the road.

## 2017-02-17 NOTE — Assessment & Plan Note (Signed)
No improvement with stretching, icing, and NSAIDs. As such, need to rule out other possible etiologies. Concern for possible calcaneal stress fracture, given constant pain, no improvement with conservative measures, and pain with calcaneal squeeze. As such, will obtain plain films to rule out. As no supination noted on gait analysis, encouraged patient to stop using the insoles she has been wearing, and instead provided green insoles with scaphoid pads. Will also add calf stretches to the stretching regimen she is already performing. Discussed PT, however patient not wishing to proceed with that at this time. If calcaneal stress fracture noted on XR, will proceed with non-weightbearing x6 wks. Otherwise, f/u in 4 wks.

## 2017-02-18 ENCOUNTER — Encounter: Payer: Self-pay | Admitting: Sports Medicine

## 2017-03-17 ENCOUNTER — Ambulatory Visit: Payer: 59 | Admitting: Sports Medicine

## 2017-03-17 VITALS — BP 110/80 | Ht 64.0 in | Wt 150.0 lb

## 2017-03-17 DIAGNOSIS — M722 Plantar fascial fibromatosis: Secondary | ICD-10-CM | POA: Diagnosis not present

## 2017-03-17 NOTE — Assessment & Plan Note (Signed)
-   Improving with change of footwear. - Recommended continuing exercises and stretching foot with frozen water bottle. - Recommended wearing tennis shoes with insoles while walking for extended period of time and to let pain be her guide for how long to walk. - Counseled that is that it is okay to try dry needling. - Set expectation that recovery may continue into the summertime. - To return if pain worsens or continues beyond the summer.

## 2017-03-17 NOTE — Progress Notes (Addendum)
[facility] Progress Note  Subjective:  Kristina Flores is a 57 y.o. female who presents for follow-up of left plantar fasciitis.  She has had intermittent symptoms since last spring but had been getting worse since December.  Since last visit, she has not been using a certain pair of shoes from [employer] that were new around time of symptom onset.  She has noticed about 40% improvement since her visit last month after avoiding this pair of shoes.  She has been wearing insoles with scaphoid pad in her running shoes, which were fitted last visit, but does not notice any difference. However, the insoles do not bother her.  She is still doing ice bottle rolling and exercises.  She says this is her third episode of plantar fasciitis (thinks has been of both feet), and reports improvement with steroids the first episode, no improvement with second episode and not interested in trying this episode.  She has a friend who does dry needling, and she wonders if this is something she might try.  She is also using a toe separator for her bunion on the right foot and wants to know if this is okay to use.   No Known Allergies  Social History   Tobacco Use  . Smoking status: Never Smoker  . Smokeless tobacco: Never Used  Substance Use Topics  . Alcohol use: Yes    Objective: Blood pressure 110/80, height 5\' 4"  (1.626 m), weight 150 lb (68 kg). Body mass index is 25.75 kg/m. Constitutional: Well-appearing, overweight female in no acute distress Musculoskeletal: Feet appear symmetric.  Bunion of left foot greater than that of right.  Mild tenderness to palpation over mid forefoot on the left and anterior calcaneus.  Normal range of motion with inversion and eversion of the ankle on the left.  Strength 5 out of 5 bilaterally with dorsi and plantar flexion. Minimal discomfort with heel squeeze on the left.  Neurological: Peripheral sensation intact. Psychiatric: Normal mood and affect.  Vitals  reviewed  X ray of left foot reviewed with patient.  This was negative for fracture.  Showed suspected noncontributory calcaneal heel spur.  Assessment/Plan: Plantar fasciitis of left foot - Improving with change of footwear. - Recommended continuing exercises and stretching foot with frozen water bottle. - Recommended wearing tennis shoes with insoles while walking for extended period of time and to let pain be her guide for how long to walk. - Counseled that is that it is okay to try dry needling. - Set expectation that recovery may continue into the summertime. - To return if pain worsens or continues beyond the summer.  Follow-up prn.  Olene Floss, MD [place], PGY-3  Patient seen and evaluated with the resident. I agree with the above plan of care. Patient is about 40% better. We are going to continue with the treatment plan as above. We have previously discussed custom orthotics but we are going to wait on that for now. She understands it may be several more weeks or months before complete symptom resolution. She may start to increase activity as tolerated and follow-up with me as needed.

## 2017-04-03 MED FILL — GABAPENTIN 300 MG CAPS: 300 | 90 days supply | Qty: 180 | Fill #0

## 2017-04-22 ENCOUNTER — Encounter: Payer: Self-pay | Admitting: Physician Assistant

## 2017-05-28 DIAGNOSIS — R202 Paresthesia of skin: Secondary | ICD-10-CM | POA: Diagnosis not present

## 2017-05-28 DIAGNOSIS — G609 Hereditary and idiopathic neuropathy, unspecified: Secondary | ICD-10-CM | POA: Diagnosis not present

## 2017-05-28 DIAGNOSIS — L299 Pruritus, unspecified: Secondary | ICD-10-CM | POA: Diagnosis not present

## 2017-06-01 ENCOUNTER — Ambulatory Visit: Payer: 59 | Admitting: Family Medicine

## 2017-06-01 ENCOUNTER — Encounter: Payer: Self-pay | Admitting: Family Medicine

## 2017-06-01 ENCOUNTER — Other Ambulatory Visit: Payer: Self-pay

## 2017-06-01 VITALS — BP 118/82 | HR 94 | Temp 98.0°F | Resp 18 | Ht 64.0 in | Wt 152.0 lb

## 2017-06-01 DIAGNOSIS — S61209A Unspecified open wound of unspecified finger without damage to nail, initial encounter: Secondary | ICD-10-CM | POA: Diagnosis not present

## 2017-06-01 NOTE — Progress Notes (Signed)
Subjective:    Patient ID: Kristina Flores, female    DOB: Jun 09, 1960, 57 y.o.   MRN: [id number]  06/01/2017  Hand Pain (pt states she noticed a bump on her pinkey finger ( Right ) Friday that has been red and painful.)    HPI This 57 y.o. female presents for evaluation of R fifth finger redness and pain.  Denies trauma or injury.  Patient does however plant and garden regularly and recently pointed several succulent plants.  Denies joint swelling.  Denies fever, chills, sweats.  Denies drainage from wound.  Unknown etiology.  Less red and swollen today.  Tetanus up-to-date in 2016.   BP Readings from Last 3 Encounters:  07/10/17 124/86  06/01/17 118/82  03/17/17 110/80   Wt Readings from Last 3 Encounters:  07/10/17 149 lb 3.2 oz (67.7 kg)  06/01/17 152 lb (68.9 kg)  03/17/17 150 lb (68 kg)   Immunization History  Administered Date(s) Administered  . Influenza Split 10/31/2011, 11/02/2013, 09/14/2014  . Influenza-Unspecified 10/14/2015, 10/13/2016  . Tdap 03/14/2014  . Zoster 01/13/2014    Review of Systems  Constitutional: Negative for chills, diaphoresis, fatigue and fever.  HENT: Negative for ear pain, postnasal drip, rhinorrhea, sinus pressure, sore throat and trouble swallowing.   Respiratory: Negative for cough and shortness of breath.   Cardiovascular: Negative for chest pain, palpitations and leg swelling.  Gastrointestinal: Negative for abdominal pain, constipation, diarrhea, nausea and vomiting.  Musculoskeletal: Negative for arthralgias and joint swelling.  Skin: Positive for color change and wound.    Past Medical History:  Diagnosis Date  . Allergy   . Heart murmur   . Numbness and tingling   . Osteopenia 08/2016   T score -1.6 FRAX 7.8%/0.7%  . Peripheral neuropathy, idiopathic    Past Surgical History:  Procedure Laterality Date  . fibroid in 2011,2003    . miscarriage 1989    . ovarian cyst 1992     No Known Allergies Current Outpatient  Medications on File Prior to Visit  Medication Sig Dispense Refill  . Alpha-Lipoic Acid 600 MG CAPS Take by mouth.    . Calcium Carb-Cholecalciferol (CALCIUM 600 + D PO) Take 1 tablet by mouth daily.    . cetirizine (ZYRTEC) 10 MG chewable tablet Chew 10 mg by mouth daily.    . Cholecalciferol 1000 UNITS capsule Take 1,000 Units by mouth daily.    . cyclobenzaprine (FLEXERIL) 10 MG tablet Take 1 tablet (10 mg total) by mouth at bedtime. As needed for back pain. 20 tablet 0  . gabapentin (NEURONTIN) 300 MG capsule Take 300 mg by mouth 2 (two) times daily.    . meloxicam (MOBIC) 15 MG tablet Take 1 tablet (15 mg total) by mouth as needed. 40 tablet 0  . Omega-3 Fatty Acids (FISH OIL) 1000 MG CAPS Take 1 capsule by mouth daily.     No current facility-administered medications on file prior to visit.    Social History   Socioeconomic History  . Marital status: Married    Spouse name: [name]  . Number of children: 3  . Years of education: College  . Highest education level: Not on file  Occupational History  . Occupation: PRESCHOOL Product manager: [employer]  Social Needs  . Financial resource strain: Not on file  . Food insecurity:    Worry: Not on file    Inability: Not on file  . Transportation needs:    Medical: Not on file  Non-medical: Not on file  Tobacco Use  . Smoking status: Never Smoker  . Smokeless tobacco: Never Used  Substance and Sexual Activity  . Alcohol use: Yes  . Drug use: No  . Sexual activity: Yes    Birth control/protection: Post-menopausal  Lifestyle  . Physical activity:    Days per week: Not on file    Minutes per session: Not on file  . Stress: Not on file  Relationships  . Social connections:    Talks on phone: Not on file    Gets together: Not on file    Attends religious service: Not on file    Active member of club or organization: Not on file    Attends meetings of clubs or organizations: Not on file    Relationship status: Not  on file  . Intimate partner violence:    Fear of current or ex partner: Not on file    Emotionally abused: Not on file    Physically abused: Not on file    Forced sexual activity: Not on file  Other Topics Concern  . Not on file  Social History Narrative   Patient is married [name]) and lives at home with her husband. Married x 36 years.   Patient has three adult children; 2 in [place] (30, 64); one in [place] (25).  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   Patient has a college education.   Patient is right-handed.   Patient drinks one cup of coffee daily.   Pillates three times per week for sixteen years.     Family History  Problem Relation Age of Onset  . Cancer Mother        leukemia  . Colon cancer Father 37  . Cancer Father        prostate cancer, colon cancer  . Arthritis Father        Objective:    BP 118/82   Pulse 94   Temp 98 F (36.7 C) (Oral)   Resp 18   Ht 5\' 4"  (1.626 m)   Wt 152 lb (68.9 kg)   SpO2 94%   BMI 26.09 kg/m  Physical Exam  Constitutional: She is oriented to person, place, and time. She appears well-developed and well-nourished. No distress.  HENT:  Head: Normocephalic and atraumatic.  Eyes: Pupils are equal, round, and reactive to light. Conjunctivae are normal.  Neck: Normal range of motion. Neck supple.  Cardiovascular: Normal rate, regular rhythm and normal heart sounds. Exam reveals no gallop and no friction rub.  No murmur heard. Pulmonary/Chest: Effort normal and breath sounds normal. She has no wheezes. She has no rales.  Musculoskeletal:       Right hand: She exhibits tenderness. She exhibits normal range of motion, no bony tenderness, normal capillary refill, no deformity and no laceration. Normal sensation noted. Normal strength noted.  Right fifth finger lateral aspect localized 5 mm area of erythema, localized swelling, very minimal induration.  No  associated pustule or vesicle.  No drainage.  Neurological: She is alert and oriented to person, place, and time.  Skin: She is not diaphoretic. There is erythema.  Psychiatric: She has a normal mood and affect. Her behavior is normal.  Nursing note and vitals reviewed.  No results found. Depression screen [facility] 2/9 07/10/2017 06/01/2017 02/16/2017 01/02/2016 07/06/2015  Decreased Interest 0 0 0 0 0  Down, Depressed, Hopeless 0 0 0 0 0  PHQ -  2 Score 0 0 0 0 0   Fall Risk  07/10/2017 06/01/2017 02/16/2017 01/02/2016 07/06/2015  Falls in the past year? No No No No No    Procedure: verbal consent; wound cleansed with alcohol pad; sterile 20 gauge needle advanced into R fifth finger wound; blood expressed; wound cx obtained; bandage applied.  Patient tolerated well.    Assessment & Plan:   1. Open wound of finger, initial encounter     New.  Send wound cx; local care with daily cleansing with soap and water; apply topical antibiotic cream every morning.  Keep clean and covered during the day; remove bandage at nighttime.  RTC for increasing pain/redness/drainage/swelling. Tetanus UTD.  Orders Placed This Encounter  Procedures  . WOUND CULTURE    Order Specific Question:   Source    Answer:   finger   No orders of the defined types were placed in this encounter.   No follow-ups on file.   Gamal Todisco Elayne Guerin, M.D. Primary Care at [facility] [address] [place], [place]  [address] [phone] phone [phone] fax

## 2017-06-01 NOTE — Patient Instructions (Signed)
     IF you received an x-ray today, you will receive an invoice from [facility]. Please contact [facility] at [phone] with questions or concerns regarding your invoice.   IF you received labwork today, you will receive an invoice from [facility]. Please contact [facility] at [phone] with questions or concerns regarding your invoice.   Our billing staff will not be able to assist you with questions regarding bills from these companies.  You will be contacted with the lab results as soon as they are available. The fastest way to get your results is to activate your My Chart account. Instructions are located on the last page of this paperwork. If you have not heard from us regarding the results in 2 weeks, please contact this office.     

## 2017-06-04 LAB — WOUND CULTURE: Organism ID, Bacteria: NONE SEEN

## 2017-06-08 ENCOUNTER — Encounter: Payer: Self-pay | Admitting: Family Medicine

## 2017-06-15 DIAGNOSIS — M25559 Pain in unspecified hip: Secondary | ICD-10-CM | POA: Diagnosis not present

## 2017-06-17 DIAGNOSIS — H5203 Hypermetropia, bilateral: Secondary | ICD-10-CM | POA: Diagnosis not present

## 2017-07-02 DIAGNOSIS — M25551 Pain in right hip: Secondary | ICD-10-CM | POA: Diagnosis not present

## 2017-07-02 DIAGNOSIS — M722 Plantar fascial fibromatosis: Secondary | ICD-10-CM | POA: Diagnosis not present

## 2017-07-07 MED FILL — GABAPENTIN 300 MG CAPSULE: 300 | 90 days supply | Qty: 180 | Fill #1

## 2017-07-10 ENCOUNTER — Ambulatory Visit (INDEPENDENT_AMBULATORY_CARE_PROVIDER_SITE_OTHER): Payer: 59 | Admitting: Family Medicine

## 2017-07-10 ENCOUNTER — Encounter: Payer: Self-pay | Admitting: Family Medicine

## 2017-07-10 ENCOUNTER — Other Ambulatory Visit: Payer: Self-pay

## 2017-07-10 VITALS — BP 124/86 | HR 64 | Temp 98.4°F | Ht 64.5 in | Wt 149.2 lb

## 2017-07-10 DIAGNOSIS — J020 Streptococcal pharyngitis: Secondary | ICD-10-CM

## 2017-07-10 LAB — POCT RAPID STREP A (OFFICE): Rapid Strep A Screen: POSITIVE — AB

## 2017-07-10 MED ORDER — FLUCONAZOLE 150 MG PO TABS: 150.0000 mg | ORAL_TABLET | Freq: Once | ORAL | 0 refills | Status: AC

## 2017-07-10 MED ORDER — FLUCONAZOLE 150 MG PO TABS: 150.0000 mg | ORAL_TABLET | Freq: Once | ORAL | 0 refills | Status: DC

## 2017-07-10 MED ORDER — AMOXICILLIN 500 MG PO CAPS: 500.0000 mg | ORAL_CAPSULE | Freq: Two times a day (BID) | ORAL | 0 refills | Status: DC

## 2017-07-10 NOTE — Patient Instructions (Addendum)
     IF you received an x-ray today, you will receive an invoice from [facility]. Please contact [facility] at [phone] with questions or concerns regarding your invoice.   IF you received labwork today, you will receive an invoice from [facility]. Please contact [facility] at [phone] with questions or concerns regarding your invoice.   Our billing staff will not be able to assist you with questions regarding bills from these companies.  You will be contacted with the lab results as soon as they are available. The fastest way to get your results is to activate your My Chart account. Instructions are located on the last page of this paperwork. If you have not heard from [place] regarding the results in 2 weeks, please contact this office.     Strep Throat Strep throat is an infection of the throat. It is caused by germs. Strep throat spreads from person to person because of coughing, sneezing, or close contact. Follow these instructions at home: Medicines  Take over-the-counter and prescription medicines only as told by your doctor.  Take your antibiotic medicine as told by your doctor. Do not stop taking the medicine even if you feel better.  Have family members who also have a sore throat or fever go to a doctor. Eating and drinking  Do not share food, drinking cups, or personal items.  Try eating soft foods until your sore throat feels better.  Drink enough fluid to keep your pee (urine) clear or pale yellow. General instructions  Rinse your mouth (gargle) with a salt-water mixture 3-4 times per day or as needed. To make a salt-water mixture, stir -1 tsp of salt into 1 cup of warm water.  Make sure that all people in your house wash their hands well.  Rest.  Stay home from school or work until you have been taking antibiotics for 24 hours.  Keep all follow-up visits as told by your doctor. This is important. Contact a doctor if:  Your neck keeps  getting bigger.  You get a rash, cough, or earache.  You cough up thick liquid that is green, yellow-brown, or bloody.  You have pain that does not get better with medicine.  Your problems get worse instead of getting better.  You have a fever. Get help right away if:  You throw up (vomit).  You get a very bad headache.  You neck hurts or it feels stiff.  You have chest pain or you are short of breath.  You have drooling, very bad throat pain, or changes in your voice.  Your neck is swollen or the skin gets red and tender.  Your mouth is dry or you are peeing less than normal.  You keep feeling more tired or it is hard to wake up.  Your joints are red or they hurt. This information is not intended to replace advice given to you by your health care provider. Make sure you discuss any questions you have with your health care provider. Document Released: 06/18/2007 Document Revised: 08/29/2015 Document Reviewed: 04/24/2014 Elsevier Interactive Patient Education  [employer].

## 2017-07-10 NOTE — Progress Notes (Signed)
6/28/20195:34 PM  Kristina Flores 1960-08-29, 57 y.o. female [id number]  Chief Complaint  Patient presents with  . Sore Throat    sore throat and swollen lymphs, had some fever this wk ranging from 103. Transfer of care from Dr. Tamala Julian.    HPI:   Patient is a 57 y.o. 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 No No No No No     Depression screen [facility] 2/9 07/10/2017 06/01/2017 02/16/2017  Decreased Interest 0 0 0  Down, Depressed, Hopeless 0 0 0  PHQ - 2 Score 0 0 0    No Known Allergies  Prior to Admission medications   Medication Sig Start Date End Date Taking? Authorizing Provider  Alpha-Lipoic Acid 600 MG CAPS Take by mouth.   Yes [provider]  Calcium Carb-Cholecalciferol (CALCIUM 600 + D PO) Take 1 tablet by mouth daily.   Yes [provider]  cetirizine (ZYRTEC) 10 MG chewable tablet Chew 10 mg by mouth daily.   Yes [provider]  Cholecalciferol 1000 UNITS capsule Take 1,000 Units by mouth daily.   Yes [provider]  cyclobenzaprine (FLEXERIL) 10 MG tablet Take 1 tablet (10 mg total) by mouth at bedtime. As needed for back pain. 12/15/16  Yes Draper, Christia Reading R, DO  gabapentin (NEURONTIN) 300 MG capsule Take 300 mg by mouth 2 (two) times daily.   Yes [provider]  meloxicam (MOBIC) 15 MG tablet Take 1 tablet (15 mg total) by mouth as needed. 12/15/16  Yes Draper, Christia Reading R, DO  Omega-3 Fatty Acids (FISH OIL) 1000 MG CAPS Take 1 capsule by mouth daily.   Yes [provider]    Past Medical History:  Diagnosis Date  . Allergy   . Heart murmur   . Numbness and tingling   . Osteopenia 08/2016   T score -1.6 FRAX 7.8%/0.7%  . Peripheral  neuropathy, idiopathic     Past Surgical History:  Procedure Laterality Date  . fibroid in 2011,2003    . miscarriage 1989    . ovarian cyst 1992      Social History   Tobacco Use  . Smoking status: Never Smoker  . Smokeless tobacco: Never Used  Substance Use Topics  . Alcohol use: Yes    Family History  Problem Relation Age of Onset  . Cancer Mother        leukemia  . Colon cancer Father 44  . Cancer Father        prostate cancer, colon cancer  . Arthritis Father     ROS Per hpi  OBJECTIVE:  Blood pressure 124/86, pulse 64, temperature 98.4 F (36.9 C), temperature source Oral, height 5' 4.5" (1.638 m), weight 149 lb 3.2 oz (67.7 kg), SpO2 98 %.  Physical Exam  Constitutional: She is oriented to person, place, and time. She appears well-developed and well-nourished.  HENT:  Head: Normocephalic and atraumatic.  Right Ear: Hearing, tympanic membrane, external ear and ear canal normal.  Left Ear: Hearing, tympanic membrane, external ear and ear canal normal.  Mouth/Throat: Mucous membranes are normal. Posterior oropharyngeal erythema present. No tonsillar exudate.  Eyes: Pupils are equal, round, and reactive to light. EOM are normal.  Neck: Neck  supple.  Cardiovascular: Normal rate, regular rhythm and normal heart sounds. Exam reveals no gallop and no friction rub.  No murmur heard. Pulmonary/Chest: Effort normal and breath sounds normal. She has no wheezes. She has no rales.  Lymphadenopathy:    She has no cervical adenopathy.  Neurological: She is alert and oriented to person, place, and time.  Skin: Skin is warm and dry.  Psychiatric: She has a normal mood and affect.  Nursing note and vitals reviewed.   Results for orders placed or performed in visit on 07/10/17 (from the past 24 hour(s))  POCT rapid strep A     Status: Abnormal   Collection Time: 07/10/17  5:35 PM  Result Value Ref Range   Rapid Strep A Screen Positive (A) Negative    ASSESSMENT and  PLAN   1. Streptococcal sore throat Discussed supportive measures, new meds r/se/b and RTC precautions. Patient educational handout given. - POCT rapid strep A  Other orders - amoxicillin (AMOXIL) 500 MG capsule; Take 1 capsule (500 mg total) by mouth 2 (two) times daily. - fluconazole (DIFLUCAN) 150 MG tablet; Take 1 tablet (150 mg total) by mouth once for 1 dose.  Return if symptoms worsen or fail to improve.    Rutherford Guys, MD Primary Care at [facility] [place], [place] [address] Ph.  [phone] Fax [phone]

## 2017-09-29 ENCOUNTER — Encounter: Payer: 59 | Admitting: Obstetrics & Gynecology

## 2017-10-06 MED FILL — GABAPENTIN 300 MG CAPSULE: 300 | 90 days supply | Qty: 180 | Fill #2

## 2017-10-08 ENCOUNTER — Encounter: Payer: Self-pay | Admitting: Obstetrics & Gynecology

## 2017-10-08 ENCOUNTER — Ambulatory Visit (INDEPENDENT_AMBULATORY_CARE_PROVIDER_SITE_OTHER): Payer: 59 | Admitting: Obstetrics & Gynecology

## 2017-10-08 VITALS — BP 124/74 | Ht 63.5 in | Wt 151.0 lb

## 2017-10-08 DIAGNOSIS — Z780 Asymptomatic menopausal state: Secondary | ICD-10-CM | POA: Diagnosis not present

## 2017-10-08 DIAGNOSIS — M85851 Other specified disorders of bone density and structure, right thigh: Secondary | ICD-10-CM

## 2017-10-08 DIAGNOSIS — Z23 Encounter for immunization: Secondary | ICD-10-CM | POA: Diagnosis not present

## 2017-10-08 DIAGNOSIS — M8588 Other specified disorders of bone density and structure, other site: Secondary | ICD-10-CM | POA: Diagnosis not present

## 2017-10-08 DIAGNOSIS — Z01419 Encounter for gynecological examination (general) (routine) without abnormal findings: Secondary | ICD-10-CM

## 2017-10-08 NOTE — Progress Notes (Signed)
Kristina Flores 1960-10-01 [id number]   History:    56 y.o. [id number] Married.  Children doing well.  Works in a Airline pilot.  RP:  Established patient presenting for annual gyn exam   HPI: Menopause, well on no hormone replacement therapy.  No postmenopausal bleeding.  No pelvic pain.  No pain with intercourse, using a lubricant.  Urine and bowel movements normal.  Doing Kegel exercises.  Breast normal.  Body mass index 26.33.  Healthy nutrition and physically active.  Enjoys hiking and camping.  Health labs with family physician.  Colonoscopy in 2015.  Past medical history,surgical history, family history and social history were all reviewed and documented in the EPIC chart.  Gynecologic History No LMP recorded. Patient is postmenopausal. Contraception: post menopausal status Last Pap: 2017. Results were: normal Last mammogram: 02/2017. Results were: Negative Bone Density: 08/2016 Osteopenia T-Score -1.6 at Rt Femoral neck.  Repeat BD at 1 yrs. Colonoscopy: 2015  Obstetric History OB History  Gravida Para Term Preterm AB Living  4 3     1 3   SAB TAB Ectopic Multiple Live Births               # Outcome Date GA Lbr Len/2nd Weight Sex Delivery Anes PTL Lv  4 AB           3 Para           2 Para           1 Para              ROS: A ROS was performed and pertinent positives and negatives are included in the history.  GENERAL: No fevers or chills. HEENT: No change in vision, no earache, sore throat or sinus congestion. NECK: No pain or stiffness. CARDIOVASCULAR: No chest pain or pressure. No palpitations. PULMONARY: No shortness of breath, cough or wheeze. GASTROINTESTINAL: No abdominal pain, nausea, vomiting or diarrhea, melena or bright red blood per rectum. GENITOURINARY: No urinary frequency, urgency, hesitancy or dysuria. MUSCULOSKELETAL: No joint or muscle pain, no back pain, no recent trauma. DERMATOLOGIC: No rash, no itching, no lesions. ENDOCRINE: No polyuria,  polydipsia, no heat or cold intolerance. No recent change in weight. HEMATOLOGICAL: No anemia or easy bruising or bleeding. NEUROLOGIC: No headache, seizures, numbness, tingling or weakness. PSYCHIATRIC: No depression, no loss of interest in normal activity or change in sleep pattern.     Exam:   BP 124/74   Ht 5' 3.5" (1.613 m)   Wt 151 lb (68.5 kg)   BMI 26.33 kg/m   Body mass index is 26.33 kg/m.  General appearance : Well developed well nourished female. No acute distress HEENT: Eyes: no retinal hemorrhage or exudates,  Neck supple, trachea midline, no carotid bruits, no thyroidmegaly Lungs: Clear to auscultation, no rhonchi or wheezes, or rib retractions  Heart: Regular rate and rhythm, no murmurs or gallops Breast:Examined in sitting and supine position were symmetrical in appearance, no palpable masses or tenderness,  no skin retraction, no nipple inversion, no nipple discharge, no skin discoloration, no axillary or supraclavicular lymphadenopathy Abdomen: no palpable masses or tenderness, no rebound or guarding Extremities: no edema or skin discoloration or tenderness  Pelvic: Vulva: Normal             Vagina: No gross lesions or discharge  Cervix: No gross lesions or discharge.  Pap reflex done  Uterus  AV, normal size, shape and consistency, non-tender and mobile  Adnexa  Without masses or  tenderness  Anus: Normal   Assessment/Plan:  57 y.o. female for annual exam   1. Encounter for routine gynecological examination with Papanicolaou smear of cervix Normal gynecologic exam.  Pap reflex done.  Breast exam normal.  Screening mammogram was negative in February 2019.  Body mass index 26.33.  Continue with healthy nutrition and fitness.  Health labs with family physician.  Colonoscopy in 2015. - Pap IG w/ reflex to HPV when ASC-U  2. Menopause present Well on no hormone replacement therapy.  No postmenopausal bleeding.  3. Osteopenia of neck of right femur Continue with  vitamin D supplements, calcium intake of 1.5 g/day and regular weightbearing physical activity.  Will repeat bone density at 3 years.  4. Flu vaccine need Flu shot given today.  [name], 12:37 PM 10/08/2017

## 2017-10-08 NOTE — Patient Instructions (Signed)
1. Encounter for routine gynecological examination with Papanicolaou smear of cervix Normal gynecologic exam.  Pap reflex done.  Breast exam normal.  Screening mammogram was negative in February 2019.  Body mass index 26.33.  Continue with healthy nutrition and fitness.  Health labs with family physician.  Colonoscopy in 2015. - Pap IG w/ reflex to HPV when ASC-U  2. Menopause present Well on no hormone replacement therapy.  No postmenopausal bleeding.  3. Osteopenia of neck of right femur Continue with vitamin D supplements, calcium intake of 1.5 g/day and regular weightbearing physical activity.  Will repeat bone density at 3 years.  4. Flu vaccine need Flu shot given today.  Celise, it was a pleasure seeing you today!  I will inform you of your results as soon as they are available.

## 2017-10-12 LAB — PAP IG W/ RFLX HPV ASCU

## 2017-12-09 ENCOUNTER — Telehealth: Payer: Self-pay | Admitting: Family Medicine

## 2017-12-09 NOTE — Telephone Encounter (Signed)
MyChart message sent to pt about rescheduling their appt on 02/25/18

## 2017-12-30 MED FILL — GABAPENTIN 300 MG CAPSULE: 300 | 90 days supply | Qty: 180 | Fill #0

## 2018-01-14 ENCOUNTER — Other Ambulatory Visit: Payer: Self-pay | Admitting: Family Medicine

## 2018-01-14 DIAGNOSIS — Z1231 Encounter for screening mammogram for malignant neoplasm of breast: Secondary | ICD-10-CM

## 2018-02-11 MED FILL — SHINGRIX 50 MCG SUS: 50 | 1 days supply | Qty: 1 | Fill #0

## 2018-02-18 ENCOUNTER — Ambulatory Visit
Admission: RE | Admit: 2018-02-18 | Discharge: 2018-02-18 | Disposition: A | Discharge status: Home / Self Care | Payer: Commercial Managed Care - PPO | Source: Ambulatory Visit | Attending: Family Medicine | Admitting: Family Medicine

## 2018-02-18 ENCOUNTER — Ambulatory Visit: Payer: 59

## 2018-02-18 DIAGNOSIS — Z1231 Encounter for screening mammogram for malignant neoplasm of breast: Secondary | ICD-10-CM

## 2018-02-23 ENCOUNTER — Encounter: Payer: 59 | Admitting: Family Medicine

## 2018-02-25 ENCOUNTER — Encounter: Payer: 59 | Admitting: Family Medicine

## 2018-02-26 ENCOUNTER — Encounter: Payer: Self-pay | Admitting: Family Medicine

## 2018-02-26 ENCOUNTER — Other Ambulatory Visit: Payer: Self-pay

## 2018-02-26 ENCOUNTER — Ambulatory Visit (INDEPENDENT_AMBULATORY_CARE_PROVIDER_SITE_OTHER): Payer: 59 | Admitting: Family Medicine

## 2018-02-26 VITALS — BP 114/70 | HR 74 | Temp 98.0°F | Resp 16 | Ht 64.37 in | Wt 153.0 lb

## 2018-02-26 DIAGNOSIS — M8589 Other specified disorders of bone density and structure, multiple sites: Secondary | ICD-10-CM | POA: Diagnosis not present

## 2018-02-26 DIAGNOSIS — R6889 Other general symptoms and signs: Secondary | ICD-10-CM

## 2018-02-26 DIAGNOSIS — Z130 Encounter for screening for diseases of the blood and blood-forming organs and certain disorders involving the immune mechanism: Secondary | ICD-10-CM

## 2018-02-26 DIAGNOSIS — Z86010 Personal history of colonic polyps: Secondary | ICD-10-CM | POA: Diagnosis not present

## 2018-02-26 DIAGNOSIS — Z0000 Encounter for general adult medical examination without abnormal findings: Secondary | ICD-10-CM

## 2018-02-26 DIAGNOSIS — Z1211 Encounter for screening for malignant neoplasm of colon: Secondary | ICD-10-CM

## 2018-02-26 DIAGNOSIS — Z13228 Encounter for screening for other metabolic disorders: Secondary | ICD-10-CM | POA: Diagnosis not present

## 2018-02-26 DIAGNOSIS — G609 Hereditary and idiopathic neuropathy, unspecified: Secondary | ICD-10-CM

## 2018-02-26 DIAGNOSIS — Z13220 Encounter for screening for lipoid disorders: Secondary | ICD-10-CM

## 2018-02-26 DIAGNOSIS — E678 Other specified hyperalimentation: Secondary | ICD-10-CM

## 2018-02-26 DIAGNOSIS — Z0001 Encounter for general adult medical examination with abnormal findings: Secondary | ICD-10-CM

## 2018-02-26 DIAGNOSIS — Z1329 Encounter for screening for other suspected endocrine disorder: Secondary | ICD-10-CM

## 2018-02-26 NOTE — Progress Notes (Signed)
2/14/20209:00 AM  Kristina Flores 08-Aug-1960, 58 y.o. female [id number]  Chief Complaint  Patient presents with  . Annual Exam    HPI:   Patient is a 58 y.o. female with past medical history significant for seasonal allergies, DDD and idiopathic neuropathy who presents today for CPE  Cervical Cancer Screening: sept 2019, managed by gyn Breast Cancer Screening: 02/2018, managed by gyn Colorectal Cancer Screening: colonoscopy, + sessile polyps, 08/2013 Bone Density Testing: 08/2016, osteopenia, low frax score, she does calcium, vitamin d, weight bearing exercises, managed by Gyn HIV Screening: 2017 Seasonal Influenza Vaccination: has had this season Td/Tdap Vaccination: UTD Pneumococcal Vaccination: at age 61 Zoster Vaccination: due for second shingrix dose march, thru pharamcy Frequency of Dental evaluation: Q6 months Frequency of Eye evaluation: yearly  Fall Risk  02/26/2018 07/10/2017 06/01/2017 02/16/2017 01/02/2016  Falls in the past year? 0 No No No No  Injury with Fall? 0 - - - -     Depression screen [facility] 2/9 07/10/2017 06/01/2017 02/16/2017  Decreased Interest 0 0 0  Down, Depressed, Hopeless 0 0 0  PHQ - 2 Score 0 0 0    No Known Allergies  Prior to Admission medications   Medication Sig Start Date End Date Taking? Authorizing Provider  Alpha-Lipoic Acid 600 MG CAPS Take by mouth.   Yes [provider]  Calcium Carb-Cholecalciferol (CALCIUM 600 + D PO) Take 1 tablet by mouth daily.   Yes [provider]  cetirizine (ZYRTEC) 10 MG chewable tablet Chew 10 mg by mouth daily.   Yes [provider]  Cholecalciferol 1000 UNITS capsule Take 1,000 Units by mouth daily.   Yes [provider]  cyclobenzaprine (FLEXERIL) 10 MG tablet Take 1 tablet (10 mg total) by mouth at bedtime. As needed for back pain. 12/15/16  Yes Draper, Christia Reading R, DO  gabapentin (NEURONTIN) 300 MG capsule Take 300 mg by mouth 2 (two) times daily.   Yes [provider]  meloxicam (MOBIC) 15 MG tablet Take 1 tablet (15 mg total) by mouth as needed. 12/15/16  Yes Draper, Christia Reading R, DO  Omega-3 Fatty Acids (FISH OIL) 1000 MG CAPS Take 1 capsule by mouth daily.   Yes [provider]  vitamin B-12 (CYANOCOBALAMIN) 1000 MCG tablet Take 1,000 mcg by mouth daily.   Yes [provider]    Past Medical History:  Diagnosis Date  . Allergy   . Heart murmur   . Numbness and tingling   . Osteopenia 08/2016   T score -1.6 FRAX 7.8%/0.7%  . Peripheral neuropathy, idiopathic     Past Surgical History:  Procedure Laterality Date  . fibroid in 2011,2003    . miscarriage 1989    . ovarian cyst 1992      Social History   Tobacco Use  . Smoking status: Never Smoker  . Smokeless tobacco: Never Used  Substance Use Topics  . Alcohol use: Yes    Family History  Problem Relation Age of Onset  . Cancer Mother        leukemia  . Colon cancer Father 61  . Cancer Father        prostate cancer, colon cancer  . Arthritis Father     Review of Systems  Constitutional: Negative for chills and fever.  HENT: Positive for tinnitus. Negative for ear pain and hearing loss.   Respiratory: Negative for cough and shortness of breath.   Cardiovascular: Negative for chest pain, palpitations and leg swelling.  Gastrointestinal: Negative  for abdominal pain, nausea and vomiting.  Genitourinary: Negative for dysuria and hematuria.  Neurological: Positive for tingling. Negative for focal weakness.  Endo/Heme/Allergies: Positive for polydipsia.     OBJECTIVE:  Blood pressure 114/70, pulse 74, temperature 98 F (36.7 C), temperature source Oral, resp. rate 16, height 5' 4.37" (1.635 m), weight 153 lb (69.4 kg), SpO2 98 %. Body mass index is 25.96 kg/m.   Physical Exam Vitals signs and nursing note reviewed.  Constitutional:      Appearance: She is well-developed.  HENT:     Head: Normocephalic and atraumatic.     Right Ear: Hearing,  tympanic membrane, ear canal and external ear normal.     Left Ear: Hearing, tympanic membrane, ear canal and external ear normal.  Eyes:     Conjunctiva/sclera: Conjunctivae normal.     Pupils: Pupils are equal, round, and reactive to light.  Neck:     Musculoskeletal: Neck supple.     Thyroid: No thyromegaly.  Cardiovascular:     Rate and Rhythm: Normal rate and regular rhythm.     Heart sounds: Normal heart sounds. No murmur. No friction rub. No gallop.   Pulmonary:     Effort: Pulmonary effort is normal.     Breath sounds: Normal breath sounds. No wheezing or rales.  Abdominal:     General: Bowel sounds are normal. There is no distension.     Palpations: Abdomen is soft. There is no mass.     Tenderness: There is no abdominal tenderness.  Musculoskeletal: Normal range of motion.  Lymphadenopathy:     Cervical: No cervical adenopathy.  Skin:    General: Skin is warm and dry.  Neurological:     Mental Status: She is alert and oriented to person, place, and time.     Cranial Nerves: No cranial nerve deficit.     Gait: Gait normal.     Deep Tendon Reflexes: Reflexes are normal and symmetric.    Elevated b6 05/2017, dec 2018 [facility]  ASSESSMENT and PLAN  1. Routine physical examination Routine HCM labs ordered. HCM reviewed/discussed. Anticipatory guidance regarding healthy weight, lifestyle and choices given.   2. Idiopathic neuropathy  3. Screening, lipid - Lipid panel  4. Screening for endocrine/metabolic/immunity disorders - Comprehensive metabolic panel - TSH  5. Osteopenia of multiple sites - Vitamin D, 25-hydroxy  6. Screening for colon cancer - Ambulatory referral to Gastroenterology  7. Hx of colonic polyps - Ambulatory referral to Gastroenterology  8. Excessive vitamin B6 intake - Vitamin B6  9. Screening for deficiency anemia - CBC with Differential/Platelet  Return in about 1 year (around 02/27/2019) for CPE.    Rutherford Guys, MD Primary Care  at [place],  [address] Ph.  [phone] Fax [phone]

## 2018-03-03 LAB — COMPREHENSIVE METABOLIC PANEL
ALT: 15 IU/L (ref 0–32)
AST: 21 IU/L (ref 0–40)
Albumin/Globulin Ratio: 1.7 (ref 1.2–2.2)
Albumin: 4.4 g/dL (ref 3.8–4.9)
Alkaline Phosphatase: 50 IU/L (ref 39–117)
BUN/Creatinine Ratio: 14 (ref 12–28)
BUN: 11 mg/dL (ref 8–27)
Bilirubin Total: 0.2 mg/dL (ref 0.0–1.2)
CO2: 25 mmol/L (ref 20–29)
Calcium: 9.4 mg/dL (ref 8.7–10.3)
Chloride: 100 mmol/L (ref 96–106)
Creatinine, Ser: 0.8 mg/dL (ref 0.57–1.00)
GFR calc Af Amer: 93 mL/min/{1.73_m2} (ref >59)
GFR calc non Af Amer: 80 mL/min/{1.73_m2} (ref >59)
Globulin, Total: 2.6 g/dL (ref 1.5–4.5)
Glucose: 84 mg/dL (ref 65–99)
Potassium: 4.1 mmol/L (ref 3.5–5.2)
Sodium: 140 mmol/L (ref 134–144)
Total Protein: 7 g/dL (ref 6.0–8.5)

## 2018-03-03 LAB — LIPID PANEL
Chol/HDL Ratio: 2.1 ratio (ref 0.0–4.4)
Cholesterol, Total: 193 mg/dL (ref 100–199)
HDL: 93 mg/dL (ref >39)
LDL Calculated: 85 mg/dL (ref 0–99)
Triglycerides: 73 mg/dL (ref 0–149)
VLDL Cholesterol Cal: 15 mg/dL (ref 5–40)

## 2018-03-03 LAB — CBC WITH DIFFERENTIAL/PLATELET
Basophils Absolute: 0 10*3/uL (ref 0.0–0.2)
Basos: 1 %
EOS (ABSOLUTE): 0.1 10*3/uL (ref 0.0–0.4)
Eos: 3 %
Hematocrit: 33.1 % — ABNORMAL LOW (ref 34.0–46.6)
Hemoglobin: 11.1 g/dL (ref 11.1–15.9)
Immature Grans (Abs): 0 10*3/uL (ref 0.0–0.1)
Immature Granulocytes: 0 %
Lymphocytes Absolute: 1.2 10*3/uL (ref 0.7–3.1)
Lymphs: 39 %
MCH: 28.6 pg (ref 26.6–33.0)
MCHC: 33.5 g/dL (ref 31.5–35.7)
MCV: 85 fL (ref 79–97)
Monocytes Absolute: 0.3 10*3/uL (ref 0.1–0.9)
Monocytes: 10 %
Neutrophils Absolute: 1.5 10*3/uL (ref 1.4–7.0)
Neutrophils: 47 %
Platelets: 294 10*3/uL (ref 150–450)
RBC: 3.88 x10E6/uL (ref 3.77–5.28)
RDW: 13.2 % (ref 11.7–15.4)
WBC: 3.1 10*3/uL — ABNORMAL LOW (ref 3.4–10.8)

## 2018-03-03 LAB — VITAMIN B6: Vitamin B6: 11.8 ug/L (ref 2.0–32.8)

## 2018-03-03 LAB — TSH: TSH: 2.07 u[IU]/mL (ref 0.450–4.500)

## 2018-03-03 LAB — VITAMIN D 25 HYDROXY (VIT D DEFICIENCY, FRACTURES): Vit D, 25-Hydroxy: 52.5 ng/mL (ref 30.0–100.0)

## 2018-03-11 ENCOUNTER — Encounter: Payer: Self-pay | Admitting: Family Medicine

## 2018-03-14 ENCOUNTER — Telehealth: Payer: Commercial Managed Care - PPO | Admitting: Family

## 2018-03-14 DIAGNOSIS — J111 Influenza due to unidentified influenza virus with other respiratory manifestations: Secondary | ICD-10-CM

## 2018-03-14 DIAGNOSIS — R6889 Other general symptoms and signs: Secondary | ICD-10-CM

## 2018-03-14 MED ORDER — OSELTAMIVIR PHOSPHATE 75 MG PO CAPS: 75.0000 mg | ORAL_CAPSULE | Freq: Two times a day (BID) | ORAL | 0 refills | Status: DC

## 2018-03-14 NOTE — Progress Notes (Signed)

## 2018-03-29 MED FILL — GABAPENTIN 300 MG CAPSULE: 300 | 90 days supply | Qty: 180 | Fill #1

## 2018-05-22 NOTE — Progress Notes (Signed)
Greater than 5 minutes, yet less than 10 minutes of time have been spent researching, coordinating, and implementing care for this patient today.  Thank you for the details you included in the comment boxes. Those details are very helpful in determining the best course of treatment for you and help us to provide the best care.  

## 2018-06-24 ENCOUNTER — Encounter: Payer: Self-pay | Admitting: Family Medicine

## 2018-06-29 MED FILL — GABAPENTIN 300 MG CAPSULE: 300 | 90 days supply | Qty: 180 | Fill #2

## 2018-07-22 DIAGNOSIS — H5203 Hypermetropia, bilateral: Secondary | ICD-10-CM | POA: Diagnosis not present

## 2018-07-26 MED FILL — SHINGRIX 50 MCG SUS: 50 | 1 days supply | Qty: 1 | Fill #1

## 2018-07-28 ENCOUNTER — Encounter: Payer: Self-pay | Admitting: Gastroenterology

## 2018-08-13 ENCOUNTER — Other Ambulatory Visit: Payer: Self-pay

## 2018-08-13 ENCOUNTER — Ambulatory Visit (AMBULATORY_SURGERY_CENTER): Payer: Self-pay

## 2018-08-13 VITALS — Ht 64.0 in | Wt 150.0 lb

## 2018-08-13 DIAGNOSIS — Z86010 Personal history of colonic polyps: Secondary | ICD-10-CM

## 2018-08-13 DIAGNOSIS — Z800 Family history of malignant neoplasm of digestive organs: Secondary | ICD-10-CM

## 2018-08-13 MED ORDER — NA SULFATE-K SULFATE-MG SULF 17.5-3.13-1.6 GM/177ML PO SOLN: 1.0000 | Freq: Once | ORAL | 0 refills | Status: AC

## 2018-08-13 MED FILL — SUPREP BOWEL PREP KIT: 17.5-3.13-1 | 1 days supply | Qty: 354 | Fill #0

## 2018-08-13 NOTE — Progress Notes (Signed)
Denies allergies to eggs or soy products. Denies complication of anesthesia or sedation. Denies use of weight loss medication. Denies use of O2.   Emmi instructions given for colonoscopy.    Pre-Visit was conducted by phone due to Covid 19. Instructions were reviewed and mailed to patients confirmed home address. A 15.00 coupon for Suprep was given to the patient. Patient was encouraged to call if she had any questions regarding instructions.

## 2018-08-23 DIAGNOSIS — L299 Pruritus, unspecified: Secondary | ICD-10-CM | POA: Diagnosis not present

## 2018-08-23 DIAGNOSIS — R202 Paresthesia of skin: Secondary | ICD-10-CM | POA: Diagnosis not present

## 2018-08-23 DIAGNOSIS — G609 Hereditary and idiopathic neuropathy, unspecified: Secondary | ICD-10-CM | POA: Diagnosis not present

## 2018-08-24 ENCOUNTER — Encounter: Payer: Self-pay | Admitting: Gastroenterology

## 2018-08-26 ENCOUNTER — Telehealth: Payer: Self-pay | Admitting: Gastroenterology

## 2018-08-26 NOTE — Telephone Encounter (Signed)
Covid-19 Screening Questions     Do you now or have you had a fever in the last 14 days?       Do you have any respiratory symptoms of shortness of breath or cough now or in the last 14 days?       Do you have any family members or close contacts with diagnosed or suspected Covid-19 in the past 14 days?        Have you been tested for Covid-19 and found to be positive?      Please make pt aware that care partner may wait in the car of come up to the lobby during the procedure but will need to provide their own mask.

## 2018-08-26 NOTE — Telephone Encounter (Signed)
Kristina Flores returned call and answered NO to all the screening questions.

## 2018-08-27 ENCOUNTER — Ambulatory Visit (AMBULATORY_SURGERY_CENTER): Payer: 59 | Admitting: Gastroenterology

## 2018-08-27 ENCOUNTER — Encounter: Payer: Self-pay | Admitting: Gastroenterology

## 2018-08-27 ENCOUNTER — Other Ambulatory Visit: Payer: Self-pay

## 2018-08-27 VITALS — BP 110/71 | HR 46 | Temp 97.8°F | Resp 12 | Ht 64.0 in | Wt 150.0 lb

## 2018-08-27 DIAGNOSIS — Z86010 Personal history of colonic polyps: Secondary | ICD-10-CM | POA: Diagnosis not present

## 2018-08-27 DIAGNOSIS — Z800 Family history of malignant neoplasm of digestive organs: Secondary | ICD-10-CM | POA: Diagnosis not present

## 2018-08-27 DIAGNOSIS — D120 Benign neoplasm of cecum: Secondary | ICD-10-CM | POA: Diagnosis not present

## 2018-08-27 DIAGNOSIS — K635 Polyp of colon: Secondary | ICD-10-CM

## 2018-08-27 DIAGNOSIS — Z1211 Encounter for screening for malignant neoplasm of colon: Secondary | ICD-10-CM | POA: Diagnosis not present

## 2018-08-27 MED ORDER — SODIUM CHLORIDE 0.9 % IV SOLN: 500.0000 mL | Freq: Once | INTRAVENOUS | Status: DC

## 2018-08-27 NOTE — Patient Instructions (Signed)
Information on polyps, diverticulosis and hemorrhoids given to you today.  Await pathology results.  YOU HAD AN ENDOSCOPIC PROCEDURE TODAY AT THE [facility]:   Refer to the procedure report that was given to you for any specific questions about what was found during the examination.  If the procedure report does not answer your questions, please call your gastroenterologist to clarify.  If you requested that your care partner not be given the details of your procedure findings, then the procedure report has been included in a sealed envelope for you to review at your convenience later.  YOU SHOULD EXPECT: Some feelings of bloating in the abdomen. Passage of more gas than usual.  Walking can help get rid of the air that was put into your GI tract during the procedure and reduce the bloating. If you had a lower endoscopy (such as a colonoscopy or flexible sigmoidoscopy) you may notice spotting of blood in your stool or on the toilet paper. If you underwent a bowel prep for your procedure, you may not have a normal bowel movement for a few days.  Please Note:  You might notice some irritation and congestion in your nose or some drainage.  This is from the oxygen used during your procedure.  There is no need for concern and it should clear up in a day or so.  SYMPTOMS TO REPORT IMMEDIATELY:   Following lower endoscopy (colonoscopy or flexible sigmoidoscopy):  Excessive amounts of blood in the stool  Significant tenderness or worsening of abdominal pains  Swelling of the abdomen that is new, acute  Fever of 100F or higher   For urgent or emergent issues, a gastroenterologist can be reached at any hour by calling [phone].   DIET:  We do recommend a small meal at first, but then you may proceed to your regular diet.  Drink plenty of fluids but you should avoid alcoholic beverages for 24 hours.  ACTIVITY:  You should plan to take it easy for the rest of today and you should NOT  DRIVE or use heavy machinery until tomorrow (because of the sedation medicines used during the test).    FOLLOW UP: Our staff will call the number listed on your records 48-72 hours following your procedure to check on you and address any questions or concerns that you may have regarding the information given to you following your procedure. If we do not reach you, we will leave a message.  We will attempt to reach you two times.  During this call, we will ask if you have developed any symptoms of COVID 19. If you develop any symptoms (ie: fever, flu-like symptoms, shortness of breath, cough etc.) before then, please call [phone].  If you test positive for Covid 19 in the 2 weeks post procedure, please call and report this information to us.    If any biopsies were taken you will be contacted by phone or by letter within the next 1-3 weeks.  Please call us at [phone] if you have not heard about the biopsies in 3 weeks.    SIGNATURES/CONFIDENTIALITY: You and/or your care partner have signed paperwork which will be entered into your electronic medical record.  These signatures attest to the fact that that the information above on your After Visit Summary has been reviewed and is understood.  Full responsibility of the confidentiality of this discharge information lies with you and/or your care-partner. 

## 2018-08-27 NOTE — Progress Notes (Signed)
SP- Temp NS- Vitals

## 2018-08-27 NOTE — Op Note (Signed)
[facility] Patient Name: Kristina Flores Procedure Date: 08/27/2018 7:35 AM MRN: [id number] Endoscopist: Remo Lipps P. Havery Moros , MD Age: 58 Referring MD:  Date of Birth: [birth year] Gender: Female Account #: [id number] Procedure:                Colonoscopy Indications:              Screening in patient at increased risk: Family                            history of 1st-degree relative with colorectal                            cancer (father diagnosed age 71) Medicines:                Monitored Anesthesia Care Procedure:                Pre-Anesthesia Assessment:                           - Prior to the procedure, a History and Physical                            was performed, and patient medications and                            allergies were reviewed. The patient's tolerance of                            previous anesthesia was also reviewed. The risks                            and benefits of the procedure and the sedation                            options and risks were discussed with the patient.                            All questions were answered, and informed consent                            was obtained. Prior Anticoagulants: The patient has                            taken no previous anticoagulant or antiplatelet                            agents. ASA Grade Assessment: II - A patient with                            mild systemic disease. After reviewing the risks                            and benefits, the patient was deemed in  satisfactory condition to undergo the procedure.                           After obtaining informed consent, the colonoscope                            was passed under direct vision. Throughout the                            procedure, the patient's blood pressure, pulse, and                            oxygen saturations were monitored continuously. The                            Colonoscope was introduced  through the anus and                            advanced to the the cecum, identified by                            appendiceal orifice and ileocecal valve. The                            colonoscopy was performed without difficulty. The                            patient tolerated the procedure well. The quality                            of the bowel preparation was good. The ileocecal                            valve, appendiceal orifice, and rectum were                            photographed. Scope In: 7:41:05 AM Scope Out: 8:04:44 AM Scope Withdrawal Time: 0 hours 16 minutes 50 seconds  Total Procedure Duration: 0 hours 23 minutes 39 seconds  Findings:                 The perianal and digital rectal examinations were                            normal.                           The colon was tortuous.                           A 5 mm polyp was found in the cecum. The polyp was                            flat. The polyp was removed with a cold snare.  Resection and retrieval were complete.                           A few small-mouthed diverticula were found in the                            sigmoid colon.                           There were small internal hemorrhoids. The exam was                            otherwise without abnormality. Complications:            No immediate complications. Estimated blood loss:                            Minimal. Estimated Blood Loss:     Estimated blood loss was minimal. Impression:               - Tortuous colon.                           - One 5 mm polyp in the cecum, removed with a cold                            snare. Resected and retrieved.                           - Diverticulosis in the sigmoid colon.                           - Small hemorrhoids                           - The examination was otherwise normal. Recommendation:           - Patient has a contact number available for                             emergencies. The signs and symptoms of potential                            delayed complications were discussed with the                            patient. Return to normal activities tomorrow.                            Written discharge instructions were provided to the                            patient.                           - Resume previous diet.                           -  Continue present medications.                           - Await pathology results. Remo Lipps P. Armbruster, MD 08/27/2018 8:09:22 AM This report has been signed electronically.

## 2018-08-27 NOTE — Progress Notes (Signed)
Called to room to assist during endoscopic procedure.  Patient ID and intended procedure confirmed with present staff. Received instructions for my participation in the procedure from the performing physician.  

## 2018-08-27 NOTE — Progress Notes (Signed)
A/ox3, pleased with MAC, report to RN 

## 2018-08-27 NOTE — Progress Notes (Signed)
Pt's states no medical or surgical changes since previsit or office visit. 

## 2018-08-31 ENCOUNTER — Telehealth: Payer: Self-pay

## 2018-08-31 NOTE — Telephone Encounter (Signed)
Covid-19 screening questions   Do you now or have you had a fever in the last 14 days? No. Do you have any respiratory symptoms of shortness of breath or cough now or in the last 14 days? No.  Do you have any family members or close contacts with diagnosed or suspected Covid-19 in the past 14 days? No.  Have you been tested for Covid-19 and found to be positive? No.       Follow up Call-  Call back number 08/27/2018  Post procedure Call Back phone  # [phone]  Permission to leave phone message Yes  Some recent data might be hidden     Patient questions:  Do you have a fever, pain , or abdominal swelling? No. Pain Score  0 *  Have you tolerated food without any problems? Yes.    Have you been able to return to your normal activities? Yes.    Do you have any questions about your discharge instructions: Diet   No. Medications  No. Follow up visit  No.  Do you have questions or concerns about your Care? No.  Actions: * If pain score is 4 or above: No action needed, pain <4.

## 2018-09-07 ENCOUNTER — Ambulatory Visit: Payer: 59 | Admitting: Sports Medicine

## 2018-09-07 ENCOUNTER — Ambulatory Visit
Admission: RE | Admit: 2018-09-07 | Discharge: 2018-09-07 | Disposition: A | Discharge status: Home / Self Care | Payer: 59 | Source: Ambulatory Visit | Attending: Sports Medicine | Admitting: Sports Medicine

## 2018-09-07 ENCOUNTER — Other Ambulatory Visit: Payer: Self-pay

## 2018-09-07 VITALS — BP 124/82 | Ht 64.0 in | Wt 150.0 lb

## 2018-09-07 DIAGNOSIS — M1612 Unilateral primary osteoarthritis, left hip: Secondary | ICD-10-CM | POA: Diagnosis not present

## 2018-09-07 DIAGNOSIS — M25552 Pain in left hip: Secondary | ICD-10-CM

## 2018-09-07 NOTE — Progress Notes (Signed)
PCP: Rutherford Guys, MD  Subjective:   HPI: Patient is a 58 y.o. female here for hip pain since July.  The patient first noticed the pain when she stood up from sitting on the couch.  Since then, she has had a dull, achy, nonradiating pain that she rates as 3/10.  She states she has a family history of hip replacements (her dad, his mom), as well as many knee replacements on her mother side of the family.  The pain is aggravated by walking, hiking, and performing Pilates.  The patient has no pain when she bikes.  She has been alleviating the pain occasionally with ibuprofen.  The pain often feels worse when she is getting out of bed in the morning or getting up off of the couch after having sat for a while.    She denies any associated symptoms, including fevers, headache, chest pain, shortness of breath, nausea, vomiting, diarrhea, rashes, myalgias, and focal neurological deficits.   Past Medical History:  Diagnosis Date  . Allergy   . Heart murmur   . Numbness and tingling   . Osteopenia 08/2016   T score -1.6 FRAX 7.8%/0.7%  . Peripheral neuropathy, idiopathic     Current Outpatient Medications on File Prior to Visit  Medication Sig Dispense Refill  . Alpha-Lipoic Acid 600 MG CAPS Take by mouth.    . Calcium Carb-Cholecalciferol (CALCIUM 600 + D PO) Take 1 tablet by mouth daily.    . cetirizine (ZYRTEC) 10 MG chewable tablet Chew 10 mg by mouth daily.    . Cholecalciferol 1000 UNITS capsule Take 1,000 Units by mouth daily.    Marland Kitchen gabapentin (NEURONTIN) 300 MG capsule Take 300 mg by mouth 3 (three) times daily.     Marland Kitchen ibuprofen (ADVIL) 200 MG tablet Take 200 mg by mouth every 6 (six) hours as needed. Patient takes 3 tablets daily. 600 mg total.    . Omega-3 Fatty Acids (FISH OIL) 1000 MG CAPS Take 1 capsule by mouth daily.    Marland Kitchen OVER THE COUNTER MEDICATION Osteo-Biflex, Two tablets daily.    . vitamin B-12 (CYANOCOBALAMIN) 1000 MCG tablet Take 1,000 mcg by mouth daily.     No current  facility-administered medications on file prior to visit.     Past Surgical History:  Procedure Laterality Date  . fibroid in 2011,2003    . miscarriage 1989    . ovarian cyst 1992      No Known Allergies  Social History   Socioeconomic History  . Marital status: Married    Spouse name: [name]  . Number of children: 3  . Years of education: College  . Highest education level: Not on file  Occupational History  . Occupation: PRESCHOOL Product manager: [employer]  Social Needs  . Financial resource strain: Not on file  . Food insecurity    Worry: Not on file    Inability: Not on file  . Transportation needs    Medical: Not on file    Non-medical: Not on file  Tobacco Use  . Smoking status: Never Smoker  . Smokeless tobacco: Never Used  Substance and Sexual Activity  . Alcohol use: Yes  . Drug use: No  . Sexual activity: Yes    Birth control/protection: Post-menopausal  Lifestyle  . Physical activity    Days per week: Not on file    Minutes per session: Not on file  . Stress: Not on file  Relationships  . Social connections  Talks on phone: Not on file    Gets together: Not on file    Attends religious service: Not on file    Active member of club or organization: Not on file    Attends meetings of clubs or organizations: Not on file    Relationship status: Not on file  . Intimate partner violence    Fear of current or ex partner: Not on file    Emotionally abused: Not on file    Physically abused: Not on file    Forced sexual activity: Not on file  Other Topics Concern  . Not on file  Social History Narrative   Patient is married [name]) and lives at home with her husband. Married x 36 years.   Patient has three adult children; 2 in [place] (30, 1); one in [place] (25).  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   Patient has a college education.   Patient is right-handed.    Patient drinks one cup of coffee daily.   Pillates three times per week for sixteen years.      Family History  Problem Relation Age of Onset  . Cancer Mother        leukemia  . Colon cancer Father 73  . Cancer Father        prostate cancer, colon cancer  . Arthritis Father   . Colon cancer Paternal Uncle   . Esophageal cancer Neg Hx   . Rectal cancer Neg Hx   . Stomach cancer Neg Hx     BP 124/82   Ht 5\' 4"  (1.626 m)   Wt 150 lb (68 kg)   BMI 25.75 kg/m   Review of Systems: See HPI above.     Objective:  Physical Exam:  Gen: NAD, comfortable in exam room Respiratory: Normal work of breathing, speaking in complete sentences Right Hip:  - Inspection: No gross deformity, no swelling, erythema, or ecchymosis - Palpation: No TTP, specifically none over greater trochanter - ROM: Normal range of motion on Flexion, extension, abduction, internal and external rotation - Strength: Normal strength. - Neuro/vasc: NV intact distally - Special Tests: Negative FABER and FADIR. + Trendelenberg.  Negative logroll.  Left Hip:  - Inspection: No gross deformity, no swelling, erythema, or ecchymosis - Palpation: No TTP, specifically none over greater trochanter - ROM: Normal range of motion on Flexion, extension, abduction, internal and external rotation - Strength: Normal strength in flexion, extension, internal and external rotation.  Decreased strength in abduction. - Neuro/vasc: NV intact distally - Special Tests: Negative FABER and FADIR.  + Trendelenberg.  Logroll elicits minor pain.   Assessment & Plan:  1.  Left hip pain: Pain is possibly due to osteoarthritis, hip stabilizer weakness was also evident at today's visit with positive Trendelenburg bilaterally and 4/5 strength with left hip abduction. -Patient given hip stabilizer exercises to perform at home once daily, 3 sets of 10. -Patient can take Tylenol 500 mg every 6 hours and ibuprofen 200 mg every 6 hours as needed for  pain, inflammation -Patient will be sent for x-ray of left hip to evaluate any arthritic changes -Patient was given information for Hulan Saas, DO, if she wishes to pursue osteopathic manipulative treatment.  Follow-up 6 weeks.  Milus Banister, [place], PGY-2 09/07/2018 10:24 AM  Patient seen and evaluated with the resident.  I agree with the above plan of care.  I did personally  review her x-rays and she has no significant arthritis in the left hip.  She has definite pelvic stabilizer and hip abductor weakness on exam.  We will give her some home exercises and she will follow-up again in 6 weeks.  If symptoms persist despite strengthening exercises, consider further diagnostic imaging.  We have also given her the name of Dr. Charlann Boxer if she would like to pursue further osteopathic manipulative treatment.

## 2018-09-07 NOTE — Patient Instructions (Addendum)
Thank you for coming in to see [place] today! Please see below to review our plan for today's visit:  1. Perform your home exercises daily to strengthen your hips. 2. Use Tylenol 500mg  every 6 hours and Advil 200mg  every 6 hours as needed for pain/inflammation relief. 3. If you are interested in more Osteopathic Manipulative Medicine please reach out to: Dr. Charlann Boxer (DO) [place], [place], [place] [address] [phone]  Please call the clinicat [phone] if your symptoms worsen or you have any concerns. It was our pleasure to serve you!  Dr. Micheline Chapman

## 2018-09-21 MED FILL — GABAPENTIN 300 MG CAPSULE: 300 | 90 days supply | Qty: 270 | Fill #0

## 2018-10-11 ENCOUNTER — Other Ambulatory Visit: Payer: Self-pay

## 2018-10-12 ENCOUNTER — Encounter: Payer: Self-pay | Admitting: Obstetrics & Gynecology

## 2018-10-12 ENCOUNTER — Ambulatory Visit (INDEPENDENT_AMBULATORY_CARE_PROVIDER_SITE_OTHER): Payer: 59 | Admitting: Obstetrics & Gynecology

## 2018-10-12 ENCOUNTER — Encounter: Payer: Self-pay | Admitting: Gynecology

## 2018-10-12 VITALS — BP 110/76 | Ht 63.0 in | Wt 156.0 lb

## 2018-10-12 DIAGNOSIS — Z780 Asymptomatic menopausal state: Secondary | ICD-10-CM

## 2018-10-12 DIAGNOSIS — M8588 Other specified disorders of bone density and structure, other site: Secondary | ICD-10-CM

## 2018-10-12 DIAGNOSIS — Z01419 Encounter for gynecological examination (general) (routine) without abnormal findings: Secondary | ICD-10-CM | POA: Diagnosis not present

## 2018-10-12 DIAGNOSIS — Z23 Encounter for immunization: Secondary | ICD-10-CM | POA: Diagnosis not present

## 2018-10-12 DIAGNOSIS — M85851 Other specified disorders of bone density and structure, right thigh: Secondary | ICD-10-CM

## 2018-10-12 NOTE — Progress Notes (Signed)
Kristina Flores May 24, 1960 [id number]   History:    58 y.o. [id number] Married.  Preschool teacher.  RP:  Established patient presenting for annual gyn exam   HPI: Menopause, well on no hormone replacement therapy.  No postmenopausal bleeding.  No pelvic pain.  No pain with intercourse, using a lubricant.  Urine and bowel movements normal.  Doing Kegel exercises.  Breast normal.  Body mass index 27.63.  Healthy nutrition and physically active.  Enjoys hiking and camping.  Health labs with family physician.  Colonoscopy in 2020   Past medical history,surgical history, family history and social history were all reviewed and documented in the EPIC chart.  Gynecologic History No LMP recorded. Patient is postmenopausal. Contraception: post menopausal status Last Pap: 09/2017. Results were: Negative Last mammogram: 02/2018. Results were: Negative Bone Density: 08/2016 Osteopenia Colonoscopy: 08/2018  Obstetric History OB History  Gravida Para Term Preterm AB Living  4 3     1 3   SAB TAB Ectopic Multiple Live Births               # Outcome Date GA Lbr Len/2nd Weight Sex Delivery Anes PTL Lv  4 AB           3 Para           2 Para           1 Para              ROS: A ROS was performed and pertinent positives and negatives are included in the history.  GENERAL: No fevers or chills. HEENT: No change in vision, no earache, sore throat or sinus congestion. NECK: No pain or stiffness. CARDIOVASCULAR: No chest pain or pressure. No palpitations. PULMONARY: No shortness of breath, cough or wheeze. GASTROINTESTINAL: No abdominal pain, nausea, vomiting or diarrhea, melena or bright red blood per rectum. GENITOURINARY: No urinary frequency, urgency, hesitancy or dysuria. MUSCULOSKELETAL: No joint or muscle pain, no back pain, no recent trauma. DERMATOLOGIC: No rash, no itching, no lesions. ENDOCRINE: No polyuria, polydipsia, no heat or cold intolerance. No recent change in weight. HEMATOLOGICAL:  No anemia or easy bruising or bleeding. NEUROLOGIC: No headache, seizures, numbness, tingling or weakness. PSYCHIATRIC: No depression, no loss of interest in normal activity or change in sleep pattern.     Exam:   BP 110/76   Ht 5\' 3"  (1.6 m)   Wt 156 lb (70.8 kg)   BMI 27.63 kg/m   Body mass index is 27.63 kg/m.  General appearance : Well developed well nourished female. No acute distress HEENT: Eyes: no retinal hemorrhage or exudates,  Neck supple, trachea midline, no carotid bruits, no thyroidmegaly Lungs: Clear to auscultation, no rhonchi or wheezes, or rib retractions  Heart: Regular rate and rhythm, no murmurs or gallops Breast:Examined in sitting and supine position were symmetrical in appearance, no palpable masses or tenderness,  no skin retraction, no nipple inversion, no nipple discharge, no skin discoloration, no axillary or supraclavicular lymphadenopathy Abdomen: no palpable masses or tenderness, no rebound or guarding Extremities: no edema or skin discoloration or tenderness  Pelvic: Vulva: Normal             Vagina: No gross lesions or discharge  Cervix: No gross lesions or discharge  Uterus  AV, normal size, shape and consistency, non-tender and mobile  Adnexa  Without masses or tenderness  Anus: Normal   Assessment/Plan:  58 y.o. female for annual exam   1. Well female exam with routine  gynecological exam Normal gynecologic exam in menopause.  Pap test September 2019 was negative, no indication to repeat this year.  Breast exam normal.  Last screening mammogram February 2020 was negative.  Colonoscopy in August 2020.  Body mass index 27.63.  Continue with fitness and healthy nutrition.  Health labs with family physician.  2. Postmenopause Well on no hormone replacement therapy.  No postmenopausal bleeding.  3. Osteopenia of neck of right femur Osteopenia on bone density in 2018.  We will repeat a bone density here now.  Vitamin D supplements, calcium intake  of 1200 mg daily and regular weightbearing physical activity is recommended. - DG Bone Density; Future  4. Flu vaccine need Flu shot given today.  Other orders - Flu Vaccine QUAD 36+ mos IM (Fluarix, Quad PF)  [name], 3:39 PM 10/12/2018

## 2018-10-14 ENCOUNTER — Encounter: Payer: Self-pay | Admitting: Obstetrics & Gynecology

## 2018-10-14 NOTE — Patient Instructions (Signed)
1. Well female exam with routine gynecological exam Normal gynecologic exam in menopause.  Pap test September 2019 was negative, no indication to repeat this year.  Breast exam normal.  Last screening mammogram February 2020 was negative.  Colonoscopy in August 2020.  Body mass index 27.63.  Continue with fitness and healthy nutrition.  Health labs with family physician.  2. Postmenopause Well on no hormone replacement therapy.  No postmenopausal bleeding.  3. Osteopenia of neck of right femur Osteopenia on bone density in 2018.  We will repeat a bone density here now.  Vitamin D supplements, calcium intake of 1200 mg daily and regular weightbearing physical activity is recommended. - DG Bone Density; Future  4. Flu vaccine need Flu shot given today.  Other orders - Flu Vaccine QUAD 36+ mos IM (Fluarix, Quad PF)  Kristina Flores, as always it was a pleasure seeing you today!

## 2018-10-19 ENCOUNTER — Ambulatory Visit: Payer: 59 | Admitting: Sports Medicine

## 2018-10-25 ENCOUNTER — Other Ambulatory Visit: Payer: Self-pay

## 2018-10-26 ENCOUNTER — Ambulatory Visit (INDEPENDENT_AMBULATORY_CARE_PROVIDER_SITE_OTHER): Payer: 59

## 2018-10-26 ENCOUNTER — Other Ambulatory Visit: Payer: Self-pay | Admitting: Obstetrics & Gynecology

## 2018-10-26 DIAGNOSIS — M85851 Other specified disorders of bone density and structure, right thigh: Secondary | ICD-10-CM

## 2018-10-26 DIAGNOSIS — Z780 Asymptomatic menopausal state: Secondary | ICD-10-CM

## 2018-10-26 DIAGNOSIS — M8588 Other specified disorders of bone density and structure, other site: Secondary | ICD-10-CM | POA: Diagnosis not present

## 2018-11-29 DIAGNOSIS — R238 Other skin changes: Secondary | ICD-10-CM | POA: Diagnosis not present

## 2018-11-29 DIAGNOSIS — Z23 Encounter for immunization: Secondary | ICD-10-CM | POA: Diagnosis not present

## 2018-12-27 MED FILL — GABAPENTIN 300 MG CAPSULE: 300 | 90 days supply | Qty: 270 | Fill #1

## 2019-01-17 ENCOUNTER — Other Ambulatory Visit: Payer: Self-pay | Admitting: Obstetrics & Gynecology

## 2019-01-17 DIAGNOSIS — Z1231 Encounter for screening mammogram for malignant neoplasm of breast: Secondary | ICD-10-CM

## 2019-01-19 DIAGNOSIS — Z23 Encounter for immunization: Secondary | ICD-10-CM | POA: Diagnosis not present

## 2019-01-19 DIAGNOSIS — R238 Other skin changes: Secondary | ICD-10-CM | POA: Diagnosis not present

## 2019-01-19 DIAGNOSIS — L578 Other skin changes due to chronic exposure to nonionizing radiation: Secondary | ICD-10-CM | POA: Diagnosis not present

## 2019-01-19 DIAGNOSIS — L814 Other melanin hyperpigmentation: Secondary | ICD-10-CM | POA: Diagnosis not present

## 2019-01-19 DIAGNOSIS — D225 Melanocytic nevi of trunk: Secondary | ICD-10-CM | POA: Diagnosis not present

## 2019-01-19 DIAGNOSIS — L821 Other seborrheic keratosis: Secondary | ICD-10-CM | POA: Diagnosis not present

## 2019-01-19 DIAGNOSIS — L720 Epidermal cyst: Secondary | ICD-10-CM | POA: Diagnosis not present

## 2019-02-28 ENCOUNTER — Encounter: Payer: Self-pay | Admitting: Family Medicine

## 2019-02-28 ENCOUNTER — Ambulatory Visit: Payer: 59 | Attending: Internal Medicine

## 2019-02-28 ENCOUNTER — Other Ambulatory Visit: Payer: Self-pay

## 2019-02-28 ENCOUNTER — Ambulatory Visit (INDEPENDENT_AMBULATORY_CARE_PROVIDER_SITE_OTHER): Payer: 59 | Admitting: Family Medicine

## 2019-02-28 ENCOUNTER — Ambulatory Visit
Admission: RE | Admit: 2019-02-28 | Discharge: 2019-02-28 | Disposition: A | Discharge status: Home / Self Care | Payer: 59 | Source: Ambulatory Visit | Attending: Obstetrics & Gynecology | Admitting: Obstetrics & Gynecology

## 2019-02-28 VITALS — BP 127/81 | HR 55 | Temp 98.3°F | Ht 63.0 in | Wt 159.0 lb

## 2019-02-28 DIAGNOSIS — Z23 Encounter for immunization: Secondary | ICD-10-CM | POA: Insufficient documentation

## 2019-02-28 DIAGNOSIS — Z0001 Encounter for general adult medical examination with abnormal findings: Secondary | ICD-10-CM

## 2019-02-28 DIAGNOSIS — Z0000 Encounter for general adult medical examination without abnormal findings: Secondary | ICD-10-CM

## 2019-02-28 DIAGNOSIS — E559 Vitamin D deficiency, unspecified: Secondary | ICD-10-CM | POA: Diagnosis not present

## 2019-02-28 DIAGNOSIS — Z1231 Encounter for screening mammogram for malignant neoplasm of breast: Secondary | ICD-10-CM | POA: Diagnosis not present

## 2019-02-28 DIAGNOSIS — Z13220 Encounter for screening for lipoid disorders: Secondary | ICD-10-CM | POA: Diagnosis not present

## 2019-02-28 DIAGNOSIS — Z130 Encounter for screening for diseases of the blood and blood-forming organs and certain disorders involving the immune mechanism: Secondary | ICD-10-CM | POA: Diagnosis not present

## 2019-02-28 DIAGNOSIS — Z13228 Encounter for screening for other metabolic disorders: Secondary | ICD-10-CM | POA: Diagnosis not present

## 2019-02-28 DIAGNOSIS — Z1329 Encounter for screening for other suspected endocrine disorder: Secondary | ICD-10-CM

## 2019-02-28 DIAGNOSIS — E678 Other specified hyperalimentation: Secondary | ICD-10-CM | POA: Diagnosis not present

## 2019-02-28 NOTE — Patient Instructions (Addendum)
Preventive Care 40-61 Years Old, Female Preventive care refers to visits with your health care provider and lifestyle choices that can promote health and wellness. This includes:  A yearly physical exam. This may also be called an annual well check.  Regular dental visits and eye exams.  Immunizations.  Screening for certain conditions.  Healthy lifestyle choices, such as eating a healthy diet, getting regular exercise, not using drugs or products that contain nicotine and tobacco, and limiting alcohol use. What can I expect for my preventive care visit? Physical exam Your health care provider will check your:  Height and weight. This may be used to calculate body mass index (BMI), which tells if you are at a healthy weight.  Heart rate and blood pressure.  Skin for abnormal spots. Counseling Your health care provider may ask you questions about your:  Alcohol, tobacco, and drug use.  Emotional well-being.  Home and relationship well-being.  Sexual activity.  Eating habits.  Work and work environment.  Method of birth control.  Menstrual cycle.  Pregnancy history. What immunizations do I need?  Influenza (flu) vaccine  This is recommended every year. Tetanus, diphtheria, and pertussis (Tdap) vaccine  You may need a Td booster every 10 years. Varicella (chickenpox) vaccine  You may need this if you have not been vaccinated. Zoster (shingles) vaccine  You may need this after age 60. Measles, mumps, and rubella (MMR) vaccine  You may need at least one dose of MMR if you were born in 1957 or later. You may also need a second dose. Pneumococcal conjugate (PCV13) vaccine  You may need this if you have certain conditions and were not previously vaccinated. Pneumococcal polysaccharide (PPSV23) vaccine  You may need one or two doses if you smoke cigarettes or if you have certain conditions. Meningococcal conjugate (MenACWY) vaccine  You may need this if you  have certain conditions. Hepatitis A vaccine  You may need this if you have certain conditions or if you travel or work in places where you may be exposed to hepatitis A. Hepatitis B vaccine  You may need this if you have certain conditions or if you travel or work in places where you may be exposed to hepatitis B. Haemophilus influenzae type b (Hib) vaccine  You may need this if you have certain conditions. Human papillomavirus (HPV) vaccine  If recommended by your health care provider, you may need three doses over 6 months. You may receive vaccines as individual doses or as more than one vaccine together in one shot (combination vaccines). Talk with your health care provider about the risks and benefits of combination vaccines. What tests do I need? Blood tests  Lipid and cholesterol levels. These may be checked every 5 years, or more frequently if you are over 50 years old.  Hepatitis C test.  Hepatitis B test. Screening  Lung cancer screening. You may have this screening every year starting at age 55 if you have a 30-pack-year history of smoking and currently smoke or have quit within the past 15 years.  Colorectal cancer screening. All adults should have this screening starting at age 50 and continuing until age 75. Your health care provider may recommend screening at age 45 if you are at increased risk. You will have tests every 1-10 years, depending on your results and the type of screening test.  Diabetes screening. This is done by checking your blood sugar (glucose) after you have not eaten for a while (fasting). You may have this   done every 1-3 years.  Mammogram. This may be done every 1-2 years. Talk with your health care provider about when you should start having regular mammograms. This may depend on whether you have a family history of breast cancer.  BRCA-related cancer screening. This may be done if you have a family history of breast, ovarian, tubal, or peritoneal  cancers.  Pelvic exam and Pap test. This may be done every 3 years starting at age 75. Starting at age 61, this may be done every 5 years if you have a Pap test in combination with an HPV test. Other tests  Sexually transmitted disease (STD) testing.  Bone density scan. This is done to screen for osteoporosis. You may have this scan if you are at high risk for osteoporosis. Follow these instructions at home: Eating and drinking  Eat a diet that includes fresh fruits and vegetables, whole grains, lean protein, and low-fat dairy.  Take vitamin and mineral supplements as recommended by your health care provider.  Do not drink alcohol if: ? Your health care provider tells you not to drink. ? You are pregnant, may be pregnant, or are planning to become pregnant.  If you drink alcohol: ? Limit how much you have to 0-1 drink a day. ? Be aware of how much alcohol is in your drink. In the [place] one drink equals one 12 oz bottle of beer (355 mL), one 5 oz glass of wine (148 mL), or one 1 oz glass of hard liquor (44 mL). Lifestyle  Take daily care of your teeth and gums.  Stay active. Exercise for at least 30 minutes on 5 or more days each week.  Do not use any products that contain nicotine or tobacco, such as cigarettes, e-cigarettes, and chewing tobacco. If you need help quitting, ask your health care provider.  If you are sexually active, practice safe sex. Use a condom or other form of birth control (contraception) in order to prevent pregnancy and STIs (sexually transmitted infections).  If told by your health care provider, take low-dose aspirin daily starting at age 10. What's next?  Visit your health care provider once a year for a well check visit.  Ask your health care provider how often you should have your eyes and teeth checked.  Stay up to date on all vaccines. This information is not intended to replace advice given to you by your health care provider. Make sure you  discuss any questions you have with your health care provider. Document Revised: 09/10/2017 Document Reviewed: 09/10/2017 Elsevier Patient Education  [employer].     If you have lab work done today you will be contacted with your lab results within the next 2 weeks.  If you have not heard from [place] then please contact us. The fastest way to get your results is to register for My Chart.   IF you received an x-ray today, you will receive an invoice from [facility]. Please contact [facility] at [phone] with questions or concerns regarding your invoice.   IF you received labwork today, you will receive an invoice from [facility]. Please contact [facility] at [phone] with questions or concerns regarding your invoice.   Our billing staff will not be able to assist you with questions regarding bills from these companies.  You will be contacted with the lab results as soon as they are available. The fastest way to get your results is to activate your My Chart account. Instructions are located on the last  page of this paperwork. If you have not heard from [place] regarding the results in 2 weeks, please contact this office.

## 2019-02-28 NOTE — Progress Notes (Signed)
2/15/20212:07 PM  [place] 11-06-1960, 59 y.o., female [id number]  Chief Complaint  Patient presents with  . Annual Exam    HPI:   Patient is a 59 y.o. 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 who presents today for CPE  Cervical Cancer Screening: WWE with gyn sept 2020, last pap 2019 Breast Cancer Screening: had this morning, per gyn Colorectal Cancer Screening: colonoscopy in aug 2020, serrate polyp, + FHX, repeat in 5 years Bone Density Testing: ordered by gyn sept 2020, osteopenia per patient HIV Screening: 2017 Seasonal Influenza Vaccination: UTD Td/Tdap Vaccination: 2016 Pneumococcal Vaccination: at age 17 Zoster Vaccination: completed Frequency of Dental evaluation: Q6 months Frequency of Eye evaluation: yearly, wears eyeglasses Hikes/walks/cycles  She plans on getting covid vaccine  She has been taking feso4 twice a week  As she donates blood every 8 weeks   Hearing Screening   _0  _1  _2  _3  _4  _5  _6  _7  _8   Right ear:           Left ear:             Visual Acuity Screening   Right eye Left eye Both eyes  Without correction:     With correction: _9    Depression screen [facility] 2/9 02/28/2019 07/10/2017 06/01/2017  Decreased Interest 0 0 0  Down, Depressed, Hopeless 0 0 0  PHQ - 2 Score 0 0 0    Fall Risk  02/28/2019 02/26/2018 07/10/2017 06/01/2017 02/16/2017  Falls in the past year? 0 0 No No No  Number falls in past yr: 0 - - - -  Injury with Fall? 0 0 - - -     No Known Allergies  Prior to Admission medications   Medication Sig Start Date End Date Taking? Authorizing Provider  Alpha-Lipoic Acid 600 MG CAPS Take by mouth.   Yes [provider]  Calcium Carbonate-Vitamin D 600-200 MG-UNIT TABS Take by mouth.   Yes [provider]  cetirizine (ZYRTEC) 10 MG chewable tablet Chew 10 mg by mouth daily.   Yes [provider]   Cholecalciferol 1000 UNITS capsule Take 1,000 Units by mouth daily.   Yes [provider]  gabapentin (NEURONTIN) 300 MG capsule Take 300 mg by mouth 3 (three) times daily.    Yes [provider]  ibuprofen (ADVIL) 200 MG tablet Take 200 mg by mouth every 6 (six) hours as needed. Patient takes 3 tablets daily. 600 mg total.   Yes [provider]  Omega-3 Fatty Acids (FISH OIL) 1000 MG CAPS Take 1 capsule by mouth daily.   Yes [provider]  vitamin B-12 (CYANOCOBALAMIN) 1000 MCG tablet Take 1,000 mcg by mouth daily.   Yes [provider]  OVER THE COUNTER MEDICATION Osteo-Biflex, Two tablets daily.    [provider]    Past Medical History:  Diagnosis Date  . Allergy   . Heart murmur   . Numbness and tingling   . Osteopenia 08/2016   T score -1.6 FRAX 7.8%/0.7%  . Peripheral neuropathy, idiopathic     Past Surgical History:  Procedure Laterality Date  . fibroid in 2011,2003    . miscarriage 1989    . ovarian cyst 1992      Social History   Tobacco Use  . Smoking status: Never Smoker  . Smokeless tobacco: Never Used  Substance Use Topics  . Alcohol use: Yes    Family History  Problem  Relation Age of Onset  . Cancer Mother        leukemia  . Colon cancer Father 67  . Cancer Father        prostate cancer, colon cancer  . Arthritis Father   . Colon cancer Paternal Uncle   . Esophageal cancer Neg Hx   . Rectal cancer Neg Hx   . Stomach cancer Neg Hx     Review of Systems  Constitutional: Negative for chills and fever.  HENT: Positive for tinnitus. Negative for hearing loss.   Respiratory: Negative for cough and shortness of breath.   Cardiovascular: Negative for chest pain, palpitations and leg swelling.  Gastrointestinal: Negative for abdominal pain, nausea and vomiting.  Neurological: Negative for dizziness and headaches.  All other systems reviewed and are negative.    OBJECTIVE:  Today's Vitals    02/28/19 1400  BP: 127/81  Pulse: (!) 55  Temp: 98.3 F (36.8 C)  SpO2: 99%  Weight: 159 lb (72.1 kg)  Height: _0  (1.6 m)   Body mass index is 28.17 kg/m.   Physical Exam Vitals and nursing note reviewed.  Constitutional:      Appearance: She is well-developed.  HENT:     Head: Normocephalic and atraumatic.     Right Ear: Hearing, tympanic membrane, ear canal and external ear normal.     Left Ear: Hearing, tympanic membrane, ear canal and external ear normal.     Mouth/Throat:     Mouth: Mucous membranes are moist.     Pharynx: No oropharyngeal exudate or posterior oropharyngeal erythema.  Eyes:     Extraocular Movements: Extraocular movements intact.     Conjunctiva/sclera: Conjunctivae normal.     Pupils: Pupils are equal, round, and reactive to light.  Neck:     Thyroid: No thyromegaly.  Cardiovascular:     Rate and Rhythm: Normal rate and regular rhythm.     Heart sounds: Normal heart sounds. No murmur. No friction rub. No gallop.   Pulmonary:     Effort: Pulmonary effort is normal.     Breath sounds: Normal breath sounds. No wheezing, rhonchi or rales.  Abdominal:     General: Bowel sounds are normal. There is no distension.     Palpations: Abdomen is soft. There is no hepatomegaly, splenomegaly or mass.     Tenderness: There is no abdominal tenderness.  Musculoskeletal:        General: Normal range of motion.     Cervical back: Neck supple.     Right lower leg: No edema.     Left lower leg: No edema.  Lymphadenopathy:     Cervical: No cervical adenopathy.  Skin:    General: Skin is warm and dry.  Neurological:     Mental Status: She is alert and oriented to person, place, and time.     Cranial Nerves: No cranial nerve deficit.     Gait: Gait normal.     Deep Tendon Reflexes: Reflexes are normal and symmetric.  Psychiatric:        Mood and Affect: Mood normal.        Behavior: Behavior normal.     No results found for this or any previous visit  (from the past 24 hour(s)).    ASSESSMENT and PLAN  1. Annual physical exam No concerns per history or exam. Routine HCM labs ordered. HCM reviewed/discussed. Anticipatory guidance regarding healthy weight, lifestyle and choices given.   2. Screening, lipid - Lipid panel  3.  Screening for endocrine/metabolic/immunity disorders - CMP14+EGFR - CBC with Differential/Platelet  4. Excessive vitamin B6 intake - Vitamin B6  5. Vitamin D deficiency - VITAMIN D 25 Hydroxy (Vit-D Deficiency, Fractures)  6. Screening for thyroid disorder - TSH  Other orders - Calcium Carbonate-Vitamin D 600-200 MG-UNIT TABS; Take by mouth.  No follow-ups on file.    Rutherford Guys, MD [facility] [place], [place] [id number] Ph.  [phone] Fax [phone]

## 2019-02-28 NOTE — Progress Notes (Signed)
   Covid-19 Vaccination Clinic  Name:  Kristina Flores    MRN: [id number] DOB: May 15, 1960  02/28/2019  Kristina Flores was observed post Covid-19 immunization for 15 minutes without incidence. She was provided with Vaccine Information Sheet and instruction to access the V-Safe system.   Kristina Flores was instructed to call 911 with any severe reactions post vaccine: Marland Kitchen Difficulty breathing  . Swelling of your face and throat  . A fast heartbeat  . A bad rash all over your body  . Dizziness and weakness    Immunizations Administered    Name Date Dose VIS Date Route   Pfizer COVID-19 Vaccine 02/28/2019  6:12 PM 0.3 mL 12/24/2018 Intramuscular   Manufacturer: [place]   Lot: [id number]   [place]: [id number]

## 2019-03-01 ENCOUNTER — Other Ambulatory Visit: Payer: Self-pay | Admitting: Obstetrics & Gynecology

## 2019-03-01 DIAGNOSIS — R928 Other abnormal and inconclusive findings on diagnostic imaging of breast: Secondary | ICD-10-CM

## 2019-03-02 ENCOUNTER — Ambulatory Visit
Admission: RE | Admit: 2019-03-02 | Discharge: 2019-03-02 | Disposition: A | Discharge status: Home / Self Care | Payer: 59 | Source: Ambulatory Visit | Attending: Obstetrics & Gynecology | Admitting: Obstetrics & Gynecology

## 2019-03-02 ENCOUNTER — Ambulatory Visit: Payer: 59

## 2019-03-02 ENCOUNTER — Other Ambulatory Visit: Payer: Self-pay

## 2019-03-02 DIAGNOSIS — R928 Other abnormal and inconclusive findings on diagnostic imaging of breast: Secondary | ICD-10-CM

## 2019-03-06 LAB — CMP14+EGFR
ALT: 15 IU/L (ref 0–32)
AST: 20 IU/L (ref 0–40)
Albumin/Globulin Ratio: 1.8 (ref 1.2–2.2)
Albumin: 4.3 g/dL (ref 3.8–4.8)
Alkaline Phosphatase: 69 IU/L (ref 39–117)
BUN/Creatinine Ratio: 19 (ref 12–28)
BUN: 14 mg/dL (ref 8–27)
Bilirubin Total: <0.2 mg/dL (ref 0.0–1.2)
CO2: 28 mmol/L (ref 20–29)
Calcium: 9.4 mg/dL (ref 8.7–10.3)
Chloride: 103 mmol/L (ref 96–106)
Creatinine, Ser: 0.74 mg/dL (ref 0.57–1.00)
GFR calc Af Amer: 101 mL/min/{1.73_m2} (ref >59)
GFR calc non Af Amer: 88 mL/min/{1.73_m2} (ref >59)
Globulin, Total: 2.4 g/dL (ref 1.5–4.5)
Glucose: 77 mg/dL (ref 65–99)
Potassium: 3.9 mmol/L (ref 3.5–5.2)
Sodium: 142 mmol/L (ref 134–144)
Total Protein: 6.7 g/dL (ref 6.0–8.5)

## 2019-03-06 LAB — CBC WITH DIFFERENTIAL/PLATELET
Basophils Absolute: 0 10*3/uL (ref 0.0–0.2)
Basos: 1 %
EOS (ABSOLUTE): 0.2 10*3/uL (ref 0.0–0.4)
Eos: 3 %
Hematocrit: 38.6 % (ref 34.0–46.6)
Hemoglobin: 12.8 g/dL (ref 11.1–15.9)
Immature Grans (Abs): 0 10*3/uL (ref 0.0–0.1)
Immature Granulocytes: 0 %
Lymphocytes Absolute: 1.6 10*3/uL (ref 0.7–3.1)
Lymphs: 34 %
MCH: 30.6 pg (ref 26.6–33.0)
MCHC: 33.2 g/dL (ref 31.5–35.7)
MCV: 92 fL (ref 79–97)
Monocytes Absolute: 0.4 10*3/uL (ref 0.1–0.9)
Monocytes: 8 %
Neutrophils Absolute: 2.5 10*3/uL (ref 1.4–7.0)
Neutrophils: 54 %
Platelets: 293 10*3/uL (ref 150–450)
RBC: 4.18 x10E6/uL (ref 3.77–5.28)
RDW: 12.8 % (ref 11.7–15.4)
WBC: 4.7 10*3/uL (ref 3.4–10.8)

## 2019-03-06 LAB — LIPID PANEL
Chol/HDL Ratio: 2.2 ratio (ref 0.0–4.4)
Cholesterol, Total: 183 mg/dL (ref 100–199)
HDL: 85 mg/dL (ref >39)
LDL Chol Calc (NIH): 76 mg/dL (ref 0–99)
Triglycerides: 129 mg/dL (ref 0–149)
VLDL Cholesterol Cal: 22 mg/dL (ref 5–40)

## 2019-03-06 LAB — VITAMIN D 25 HYDROXY (VIT D DEFICIENCY, FRACTURES): Vit D, 25-Hydroxy: 49.2 ng/mL (ref 30.0–100.0)

## 2019-03-06 LAB — VITAMIN B6: Vitamin B6: 12.7 ug/L (ref 2.0–32.8)

## 2019-03-06 LAB — TSH: TSH: 1.48 u[IU]/mL (ref 0.450–4.500)

## 2019-03-14 ENCOUNTER — Ambulatory Visit: Payer: Self-pay

## 2019-03-22 ENCOUNTER — Ambulatory Visit: Payer: 59 | Attending: Internal Medicine

## 2019-03-22 DIAGNOSIS — Z23 Encounter for immunization: Secondary | ICD-10-CM | POA: Insufficient documentation

## 2019-03-22 NOTE — Progress Notes (Signed)
   Covid-19 Vaccination Clinic  Name:  Kristina Flores    MRN: [id number] DOB: 01-22-1960  03/22/2019  [name] was observed post Covid-19 immunization for 15 minutes without incident. She was provided with Vaccine Information Sheet and instruction to access the V-Safe system.   [name] was instructed to call 911 with any severe reactions post vaccine: Marland Kitchen Difficulty breathing  . Swelling of face and throat  . A fast heartbeat  . A bad rash all over body  . Dizziness and weakness   Immunizations Administered    Name Date Dose VIS Date Route   Pfizer COVID-19 Vaccine 03/22/2019  8:17 AM 0.3 mL 12/24/2018 Intramuscular   Manufacturer: [place]   Lot: [id number]   [place]: [id number]

## 2019-03-23 ENCOUNTER — Ambulatory Visit: Payer: 59

## 2019-03-28 MED FILL — GABAPENTIN 300 MG CAPSULE: 300 | 90 days supply | Qty: 270 | Fill #2

## 2019-05-28 ENCOUNTER — Emergency Department
Admission: EM | Admit: 2019-05-28 | Discharge: 2019-05-28 | Disposition: A | Discharge status: Home / Self Care | Payer: 59 | Source: Home / Self Care

## 2019-05-28 ENCOUNTER — Encounter: Payer: Self-pay | Admitting: Emergency Medicine

## 2019-05-28 ENCOUNTER — Other Ambulatory Visit: Payer: Self-pay

## 2019-05-28 DIAGNOSIS — R1084 Generalized abdominal pain: Secondary | ICD-10-CM | POA: Diagnosis not present

## 2019-05-28 LAB — POCT URINALYSIS DIP (MANUAL ENTRY)
Blood, UA: NEGATIVE
Glucose, UA: NEGATIVE mg/dL
Ketones, POC UA: NEGATIVE mg/dL
Leukocytes, UA: NEGATIVE
Nitrite, UA: NEGATIVE
Protein Ur, POC: 30 mg/dL — AB
Spec Grav, UA: >=1.03 — AB (ref 1.010–1.025)
Urobilinogen, UA: 0.2 E.U./dL
pH, UA: 6 (ref 5.0–8.0)

## 2019-05-28 LAB — COMPLETE METABOLIC PANEL WITH GFR
AG Ratio: 2 (calc) (ref 1.0–2.5)
ALT: 11 U/L (ref 6–29)
AST: 17 U/L (ref 10–35)
Albumin: 4.1 g/dL (ref 3.6–5.1)
Alkaline phosphatase (APISO): 49 U/L (ref 37–153)
BUN: 12 mg/dL (ref 7–25)
CO2: 29 mmol/L (ref 20–32)
Calcium: 8.7 mg/dL (ref 8.6–10.4)
Chloride: 102 mmol/L (ref 98–110)
Creat: 0.79 mg/dL (ref 0.50–0.99)
GFR, Est African American: 94 mL/min/{1.73_m2} (ref >=60)
GFR, Est Non African American: 81 mL/min/{1.73_m2} (ref >=60)
Globulin: 2.1 g/dL (calc) (ref 1.9–3.7)
Glucose, Bld: 138 mg/dL — ABNORMAL HIGH (ref 65–99)
Potassium: 3.8 mmol/L (ref 3.5–5.3)
Sodium: 137 mmol/L (ref 135–146)
Total Bilirubin: 0.4 mg/dL (ref 0.2–1.2)
Total Protein: 6.2 g/dL (ref 6.1–8.1)

## 2019-05-28 LAB — POCT CBC W AUTO DIFF (K'VILLE URGENT CARE)

## 2019-05-28 LAB — LIPASE: Lipase: 20 U/L (ref 7–60)

## 2019-05-28 NOTE — Discharge Instructions (Signed)
  Be sure to get a lot of rest and stay well hydrated with sports drinks, water, diluted juices, and clear sodas.  Avoid fried fatty food, spicy food, and milk as these foods can cause worsening stomach upset.    Follow up with family medicine in 2-3 days if not improving for recheck of symptoms.  Call 911 or go to the hospital if you develop worsening pain, fever, unable to keep down fluids, blood in stool or vomiting, or other new concerning symptoms develop.

## 2019-05-28 NOTE — ED Triage Notes (Signed)
Patient c/o bloating feeling, sour stomach, no nausea, no vomiting, no diarrhea, afebrile for several days, has taken OTC TUMS, Pepto Bismol some relief but still doesn't feel right.

## 2019-05-28 NOTE — ED Provider Notes (Signed)
[facility]    CSN: [id number] Arrival date & time: 05/28/19  1120      History   Chief Complaint Chief Complaint  Patient presents with  . Abdominal Pain    HPI Kristina Flores is a 59 y.o. female.   HPI Kristina Flores is a 59 y.o. female presenting to UC with c/o generalized abdominal pain with sensation of bloating and "sour stomach." she denies fever, chills, n/v/d.  No urinary symptoms. Symptoms started a few days ago.  She took [employer] with moderate relief but still "doesn't feel well."  Denies other symptoms- no cough, congestion, HA, or sore throat. No sick contacts or recent travel.    Past Medical History:  Diagnosis Date  . Allergy   . Heart murmur   . Numbness and tingling   . Osteopenia 08/2016   T score -1.6 FRAX 7.8%/0.7%  . Peripheral neuropathy, idiopathic     Patient Active Problem List   Diagnosis Date Noted  . Plantar fasciitis of left foot 02/17/2017  . 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 Nonallopathic lesion of lumbosacral region 03/02/2012  . Nonallopathic lesion of cervical region 03/02/2012  . DDD (degenerative disc disease), lumbar 09/20/2011  . Menopause 09/20/2011  . Allergic rhinitis 09/20/2011    Past Surgical History:  Procedure Laterality Date  . fibroid in 2011,2003    . miscarriage 1989    . ovarian cyst 1992      OB History    Gravida  4   Para  3   Term      Preterm      AB  1   Living  3     SAB      TAB      Ectopic      Multiple      Live Births               Home Medications    Prior to Admission medications   Medication Sig Start Date End Date Taking? Authorizing Provider  Alpha-Lipoic Acid 600 MG CAPS Take by mouth.   Yes [provider]  Calcium Carbonate-Vitamin D 600-200 MG-UNIT TABS Take by mouth.   Yes [provider]  cetirizine (ZYRTEC) 10 MG chewable tablet Chew 10 mg by  mouth daily.   Yes [provider]  Cholecalciferol 1000 UNITS capsule Take 1,000 Units by mouth daily.   Yes [provider]  gabapentin (NEURONTIN) 300 MG capsule Take 300 mg by mouth 3 (three) times daily.    Yes [provider]  ibuprofen (ADVIL) 200 MG tablet Take 200 mg by mouth every 6 (six) hours as needed. Patient takes 3 tablets daily. 600 mg total.   Yes [provider]  Omega-3 Fatty Acids (FISH OIL) 1000 MG CAPS Take 1 capsule by mouth daily.   Yes [provider]  vitamin B-12 (CYANOCOBALAMIN) 1000 MCG tablet Take 1,000 mcg by mouth daily.   Yes [provider]    Family History Family History  Problem Relation Age of Onset  . Cancer Mother        leukemia  . Colon cancer Father 28  . Cancer Father        prostate cancer, colon cancer  . Arthritis Father   . Colon cancer Paternal Uncle   . Esophageal cancer Neg Hx   . Rectal cancer Neg Hx   . Stomach cancer Neg Hx  Social History Social History   Tobacco Use  . Smoking status: Never Smoker  . Smokeless tobacco: Never Used  Substance Use Topics  . Alcohol use: Yes  . Drug use: No     Allergies   Patient has no known allergies.   Review of Systems Review of Systems  Constitutional: Negative for chills and fever.  HENT: Negative for congestion, ear pain, sore throat, trouble swallowing and voice change.   Respiratory: Negative for cough and shortness of breath.   Cardiovascular: Negative for chest pain and palpitations.  Gastrointestinal: Positive for abdominal pain. Negative for abdominal distention, blood in stool, constipation, diarrhea, nausea and vomiting.  Genitourinary: Negative for dysuria, flank pain and frequency.  Musculoskeletal: Negative for arthralgias, back pain and myalgias.  Skin: Negative for rash.  Neurological: Negative for dizziness, light-headedness and headaches.  All other systems reviewed and are negative.    Physical  Exam Triage Vital Signs ED Triage Vitals  Enc Vitals Group     BP 05/28/19 1138 102/70     Pulse Rate 05/28/19 1138 72     Resp --      Temp 05/28/19 1138 97.8 F (36.6 C)     Temp Source 05/28/19 1138 Oral     SpO2 05/28/19 1138 100 %     Weight --      Height --      Head Circumference --      Peak Flow --      Pain Score 05/28/19 1140 3     Pain Loc --      Pain Edu? --      Excl. in [place]? --    No data found.  Updated Vital Signs BP 102/70 (BP Location: Left Arm)   Pulse 72   Temp 97.8 F (36.6 C) (Oral)   SpO2 100%   Visual Acuity Right Eye Distance:   Left Eye Distance:   Bilateral Distance:    Right Eye Near:   Left Eye Near:    Bilateral Near:     Physical Exam Vitals and nursing note reviewed.  Constitutional:      General: She is not in acute distress.    Appearance: She is well-developed. She is not ill-appearing, toxic-appearing or diaphoretic.  HENT:     Head: Normocephalic and atraumatic.  Cardiovascular:     Rate and Rhythm: Normal rate and regular rhythm.  Pulmonary:     Effort: Pulmonary effort is normal. No respiratory distress.     Breath sounds: Normal breath sounds.  Abdominal:     General: There is no distension.     Palpations: Abdomen is soft.     Tenderness: There is generalized abdominal tenderness. There is no right CVA tenderness, left CVA tenderness, guarding or rebound. Negative signs include Murphy's sign and McBurney's sign.  Musculoskeletal:        General: Normal range of motion.     Cervical back: Normal range of motion.  Skin:    General: Skin is warm and dry.  Neurological:     Mental Status: She is alert and oriented to person, place, and time.  Psychiatric:        Behavior: Behavior normal.      UC Treatments / Results  Labs (all labs ordered are listed, but only abnormal results are displayed) Labs Reviewed  COMPLETE METABOLIC PANEL WITH GFR - Abnormal; Notable for the following components:      Result Value    Glucose, Bld 138 (*)  All other components within normal limits  POCT URINALYSIS DIP (MANUAL ENTRY) - Abnormal; Notable for the following components:   Bilirubin, UA small (*)    Spec Grav, UA >=1.030 (*)    Protein Ur, POC =30 (*)    All other components within normal limits  LIPASE  POCT CBC W AUTO DIFF ([facility])    EKG   Radiology No results found.  Procedures Procedures (including critical care time)  Medications Ordered in UC Medications - No data to display  Initial Impression / Assessment and Plan / UC Course  I have reviewed the triage vital signs and the nursing notes.  Pertinent labs & imaging results that were available during my care of the patient were reviewed by me and considered in my medical decision making (see chart for details).     Pt appears well, NAD Abdominal exam- not concerning for surgical abdomen Encouraged conservative tx at home  F/u with PCP later in the week if not improving. Discussed symptoms that warrant emergent care in the ED. AVS provided.  Final Clinical Impressions(s) / UC Diagnoses   Final diagnoses:  Generalized abdominal pain     Discharge Instructions      Be sure to get a lot of rest and stay well hydrated with sports drinks, water, diluted juices, and clear sodas.  Avoid fried fatty food, spicy food, and milk as these foods can cause worsening stomach upset.    Follow up with family medicine in 2-3 days if not improving for recheck of symptoms.  Call 911 or go to the hospital if you develop worsening pain, fever, unable to keep down fluids, blood in stool or vomiting, or other new concerning symptoms develop.     ED Prescriptions    None     PDMP not reviewed this encounter.   Noe Gens, [place] 05/29/19 1436

## 2019-08-13 ENCOUNTER — Other Ambulatory Visit: Payer: Self-pay

## 2019-08-13 ENCOUNTER — Encounter: Payer: Self-pay | Admitting: Emergency Medicine

## 2019-08-13 ENCOUNTER — Emergency Department
Admission: EM | Admit: 2019-08-13 | Discharge: 2019-08-13 | Disposition: A | Discharge status: Home / Self Care | Payer: 59 | Source: Home / Self Care

## 2019-08-13 ENCOUNTER — Emergency Department (INDEPENDENT_AMBULATORY_CARE_PROVIDER_SITE_OTHER): Payer: 59

## 2019-08-13 DIAGNOSIS — M79675 Pain in left toe(s): Secondary | ICD-10-CM | POA: Diagnosis not present

## 2019-08-13 DIAGNOSIS — S92342A Displaced fracture of fourth metatarsal bone, left foot, initial encounter for closed fracture: Secondary | ICD-10-CM | POA: Diagnosis not present

## 2019-08-13 DIAGNOSIS — S92512A Displaced fracture of proximal phalanx of left lesser toe(s), initial encounter for closed fracture: Secondary | ICD-10-CM | POA: Diagnosis not present

## 2019-08-13 DIAGNOSIS — W2209XA Striking against other stationary object, initial encounter: Secondary | ICD-10-CM

## 2019-08-13 DIAGNOSIS — S92502A Displaced unspecified fracture of left lesser toe(s), initial encounter for closed fracture: Secondary | ICD-10-CM | POA: Diagnosis not present

## 2019-08-13 NOTE — ED Provider Notes (Signed)
[facility]    CSN: [id number] Arrival date & time: 08/13/19  1541      History   Chief Complaint Chief Complaint  Patient presents with  . Foot Injury    HPI Kristina Flores is a 59 y.o. female.   HPI Kristina Flores is a 59 y.o. female presenting to UC with c/o pain in fourth toe on Left foot after hitting it on a shower door. Pain is aching and throbbing, 5/10.  Mild bruising.  She has taken ibuprofen and used ice with mild relief.  No prior hx of fracture to foot.   Past Medical History:  Diagnosis Date  . Allergy   . Heart murmur   . Numbness and tingling   . Osteopenia 08/2016   T score -1.6 FRAX 7.8%/0.7%  . Peripheral neuropathy, idiopathic     Patient Active Problem List   Diagnosis Date Noted  . Plantar fasciitis of left foot 02/17/2017  . 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 Nonallopathic lesion of lumbosacral region 03/02/2012  . Nonallopathic lesion of cervical region 03/02/2012  . DDD (degenerative disc disease), lumbar 09/20/2011  . Menopause 09/20/2011  . Allergic rhinitis 09/20/2011    Past Surgical History:  Procedure Laterality Date  . fibroid in 2011,2003    . miscarriage 1989    . ovarian cyst 1992      OB History    Gravida  4   Para  3   Term      Preterm      AB  1   Living  3     SAB      TAB      Ectopic      Multiple      Live Births               Home Medications    Prior to Admission medications   Medication Sig Start Date End Date Taking? Authorizing Provider  Alpha-Lipoic Acid 600 MG CAPS Take by mouth.    [provider]  Calcium Carbonate-Vitamin D 600-200 MG-UNIT TABS Take by mouth.    [provider]  cetirizine (ZYRTEC) 10 MG chewable tablet Chew 10 mg by mouth daily.    [provider]  Cholecalciferol 1000 UNITS capsule Take 1,000 Units by mouth daily.    [provider]    gabapentin (NEURONTIN) 300 MG capsule Take 300 mg by mouth 3 (three) times daily.     [provider]  ibuprofen (ADVIL) 200 MG tablet Take 200 mg by mouth every 6 (six) hours as needed. Patient takes 3 tablets daily. 600 mg total.    [provider]  Omega-3 Fatty Acids (FISH OIL) 1000 MG CAPS Take 1 capsule by mouth daily.    [provider]  vitamin B-12 (CYANOCOBALAMIN) 1000 MCG tablet Take 1,000 mcg by mouth daily.    [provider]    Family History Family History  Problem Relation Age of Onset  . Cancer Mother        leukemia  . Colon cancer Father 71  . Cancer Father        prostate cancer, colon cancer  . Arthritis Father   . Colon cancer Paternal Uncle   . Esophageal cancer Neg Hx   . Rectal cancer Neg Hx   . Stomach cancer Neg Hx     Social History Social History   Tobacco Use  . Smoking status:  Never Smoker  . Smokeless tobacco: Never Used  Vaping Use  . Vaping Use: Never used  Substance Use Topics  . Alcohol use: Yes  . Drug use: No     Allergies   Patient has no known allergies.   Review of Systems Review of Systems  Musculoskeletal: Positive for arthralgias and joint swelling. Negative for myalgias.  Skin: Positive for color change. Negative for wound.     Physical Exam Triage Vital Signs ED Triage Vitals  Enc Vitals Group     BP 08/13/19 1606 (!) 138/89     Pulse Rate 08/13/19 1606 72     Resp 08/13/19 1606 16     Temp 08/13/19 1606 98.3 F (36.8 C)     Temp Source 08/13/19 1606 Oral     SpO2 08/13/19 1606 96 %     Weight 08/13/19 1607 150 lb (68 kg)     Height 08/13/19 1607 5\' 4"  (1.626 m)     Head Circumference --      Peak Flow --      Pain Score 08/13/19 1607 5     Pain Loc --      Pain Edu? --      Excl. in [place]? --    No data found.  Updated Vital Signs BP (!) 138/89 (BP Location: Right Arm)   Pulse 72   Temp 98.3 F (36.8 C) (Oral)   Resp 16   Ht 5\' 4"  (1.626 m)   Wt 150 lb (68 kg)    SpO2 96%   BMI 25.75 kg/m   Visual Acuity Right Eye Distance:   Left Eye Distance:   Bilateral Distance:    Right Eye Near:   Left Eye Near:    Bilateral Near:     Physical Exam Vitals and nursing note reviewed.  Constitutional:      Appearance: Normal appearance. She is well-developed.  HENT:     Head: Normocephalic and atraumatic.  Cardiovascular:     Rate and Rhythm: Normal rate.  Pulmonary:     Effort: Pulmonary effort is normal.  Musculoskeletal:        General: Swelling and tenderness present. Normal range of motion.     Cervical back: Normal range of motion.     Comments: Left foot: full ROM toes. Mild edema. Tenderness to fourth.   Skin:    General: Skin is warm and dry.     Capillary Refill: Capillary refill takes less than 2 seconds.     Findings: Bruising (faint base of Left fourth toe) present.  Neurological:     Mental Status: She is alert and oriented to person, place, and time.     Sensory: No sensory deficit.  Psychiatric:        Behavior: Behavior normal.      UC Treatments / Results  Labs (all labs ordered are listed, but only abnormal results are displayed) Labs Reviewed - No data to display  EKG   Radiology DG Foot Complete Left  Result Date: 08/13/2019 CLINICAL DATA:  Injury to the fourth and fifth digits well in the shower EXAM: LEFT FOOT - COMPLETE 3+ VIEW COMPARISON:  None. FINDINGS: Nondisplaced fracture of the base of the fifth proximal phalanx. Additional obliquely oriented fracture through the fourth proximal phalanx as well. 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 Associated soft tissue swelling of the fourth and fifth digits. Mild hallux valgus deformity with associated soft tissue thickening along the medial aspect of the first  metatarsophalangeal joint. Midfoot and hindfoot alignment is grossly preserved though incompletely assessed on nonweightbearing films. Plantar calcaneal spur. Remaining soft tissues are  unremarkable. IMPRESSION: 1. Oblique fracture of the fourth proximal phalangeal diaphysis. 2. Possible nondisplaced fracture involving the base of the fifth proximal phalanx. 3. Mild hallux valgus deformity with associated soft tissue bunion formation. 4. Plantar calcaneal spur. Electronically Signed   By: Lovena Le M.D.   On: 08/13/2019 16:20    Procedures Procedures (including critical care time)  Medications Ordered in UC Medications - No data to display  Initial Impression / Assessment and Plan / UC Course  I have reviewed the triage vital signs and the nursing notes.  Pertinent labs & imaging results that were available during my care of the patient were reviewed by me and considered in my medical decision making (see chart for details).    Reviewed imaging with pt Pt placed in walking boot for comfort as she plans to go to [place] and doing some walking to help her son move.  F/u PCP or Sports Medicine in 1-2 weeks AVS provided  Final Clinical Impressions(s) / UC Diagnoses   Final diagnoses:  Closed fracture of phalanx of left fourth toe, initial encounter  Closed fracture of phalanx of left fifth toe, initial encounter     Discharge Instructions      You should wear the book for comfort and protection while walking. You can remove to apply a cool compress 2-3 times daily for 15-20 minutes at  a time. Call to schedule a follow up appointment in 1-2 weeks if not improving, sooner if worsening.  You may take 500mg  acetaminophen every 4-6 hours or in combination with ibuprofen 400-600mg  every 6-8 hours as needed for pain and inflammation.      ED Prescriptions    None     PDMP not reviewed this encounter.   Noe Gens, [place] 08/15/19 [phone]

## 2019-08-13 NOTE — Discharge Instructions (Signed)
  You should wear the book for comfort and protection while walking. You can remove to apply a cool compress 2-3 times daily for 15-20 minutes at  a time. Call to schedule a follow up appointment in 1-2 weeks if not improving, sooner if worsening.  You may take 500mg  acetaminophen every 4-6 hours or in combination with ibuprofen 400-600mg  every 6-8 hours as needed for pain and inflammation.

## 2019-08-13 NOTE — ED Triage Notes (Signed)
Patient just injured toes on left foot while in the shower; limping; using ice pack. Has ibuprofen and will take it now. Has had covid vaccination.

## 2019-08-23 ENCOUNTER — Other Ambulatory Visit (HOSPITAL_COMMUNITY): Payer: Self-pay | Admitting: Neurology

## 2019-08-23 DIAGNOSIS — G609 Hereditary and idiopathic neuropathy, unspecified: Secondary | ICD-10-CM | POA: Diagnosis not present

## 2019-08-23 DIAGNOSIS — L299 Pruritus, unspecified: Secondary | ICD-10-CM | POA: Diagnosis not present

## 2019-08-23 DIAGNOSIS — R202 Paresthesia of skin: Secondary | ICD-10-CM | POA: Diagnosis not present

## 2019-08-30 ENCOUNTER — Other Ambulatory Visit: Payer: Self-pay

## 2019-08-30 ENCOUNTER — Ambulatory Visit: Payer: 59 | Admitting: Sports Medicine

## 2019-08-30 VITALS — BP 104/78 | Ht 64.0 in | Wt 150.0 lb

## 2019-08-30 DIAGNOSIS — S92515A Nondisplaced fracture of proximal phalanx of left lesser toe(s), initial encounter for closed fracture: Secondary | ICD-10-CM

## 2019-08-30 NOTE — Progress Notes (Signed)
  Subjective:     Patient ID: Kristina Flores, female   DOB: Mar 08, 1960, 59 y.o.   MRN: [id number]  HPI Kristina Flores is a 59 yo F here for follow-up after an urgent care visit on 08/13/19 for a fracture of her left fourth toe. She states she was taking a shower after working out and accidentally kicked the door in between her third and fourth toe. She noticed immediately that her fourth toe was fractured, as it was painful and bent laterally. She has been buddy taping four toes together since then and wearing a boot from urgent care.   Now, she states that her pain and swelling have improved a lot. Still endorses some pain when she walks on her left foot sometimes, but not at rest. She has not been icing it recently and has not needed to take medication to help with the pain. She only wears the boot outside of the house and otherwise can walk normally inside her house with sandals. She has not yet tried wearing a normal tennis shoe.   Review of Systems As above.    Objective:   Physical Exam General: well-appearing, comfortably sitting on the exam table Resp: breathing comfortably at rest Left fourth toe:  -Swelling and light bruising on the dorsal surface of the left fourth toe -No tenderness to palpation -Limited extension, normal ROM with flexion -Neurovascularly intact    Assessment:   Kristina Flores is a 59 yo F here for follow-up of a left fourth toe fracture, confirmed with x-ray at urgent care to be an oblique fracture of the fourth proximal phalangeal diaphysis. She seems to be improving well and progressing as expected around 2 weeks out from the injury.      Plan:     -She no longer needs to wear the boot and should try wearing normal shoes as tolerated -Continue buddy taping the toes for about 2 more weeks -Return to activities as tolerated  Follow up as needed.  Patient seen and evaluated with the medical student.  I agree with the above plan of care.  Patient has a  nondisplaced oblique fracture through the proximal phalanx of the left fourth toe.  Clinically, she is doing very well.  She may discontinue her cam walker in favor of a regular supportive shoe.  Continue buddy taping for another 2 to 3 weeks.  Increase activity as tolerated.  Follow-up as needed.

## 2019-08-30 NOTE — Patient Instructions (Signed)
It was lovely meeting you!  Going forward: -Keep buddy taping the 3 toes together with cotton in between for 2 more weeks. On average, it takes 4-6 weeks for fractures like this to heal. -Stop wearing the boot and try wearing comfy shoes as tolerated. -Return to your activities as tolerated.

## 2019-09-01 DIAGNOSIS — D649 Anemia, unspecified: Secondary | ICD-10-CM | POA: Diagnosis not present

## 2019-09-01 DIAGNOSIS — Z9189 Other specified personal risk factors, not elsewhere classified: Secondary | ICD-10-CM | POA: Diagnosis not present

## 2019-09-01 DIAGNOSIS — R1900 Intra-abdominal and pelvic swelling, mass and lump, unspecified site: Secondary | ICD-10-CM | POA: Diagnosis not present

## 2019-09-01 DIAGNOSIS — R918 Other nonspecific abnormal finding of lung field: Secondary | ICD-10-CM | POA: Diagnosis not present

## 2019-09-01 DIAGNOSIS — R011 Cardiac murmur, unspecified: Secondary | ICD-10-CM | POA: Diagnosis not present

## 2019-09-01 DIAGNOSIS — I2699 Other pulmonary embolism without acute cor pulmonale: Secondary | ICD-10-CM | POA: Diagnosis not present

## 2019-09-01 DIAGNOSIS — D62 Acute posthemorrhagic anemia: Secondary | ICD-10-CM | POA: Diagnosis not present

## 2019-09-01 DIAGNOSIS — I82409 Acute embolism and thrombosis of unspecified deep veins of unspecified lower extremity: Secondary | ICD-10-CM | POA: Diagnosis not present

## 2019-09-01 DIAGNOSIS — Z515 Encounter for palliative care: Secondary | ICD-10-CM | POA: Diagnosis not present

## 2019-09-20 ENCOUNTER — Encounter: Payer: Self-pay | Admitting: Family Medicine

## 2019-09-20 ENCOUNTER — Ambulatory Visit (INDEPENDENT_AMBULATORY_CARE_PROVIDER_SITE_OTHER): Payer: 59

## 2019-09-20 ENCOUNTER — Ambulatory Visit: Payer: 59 | Admitting: Family Medicine

## 2019-09-20 ENCOUNTER — Other Ambulatory Visit: Payer: Self-pay

## 2019-09-20 VITALS — BP 137/87 | HR 81 | Temp 98.6°F | Ht 64.0 in | Wt 158.0 lb

## 2019-09-20 DIAGNOSIS — R140 Abdominal distension (gaseous): Secondary | ICD-10-CM

## 2019-09-20 DIAGNOSIS — D649 Anemia, unspecified: Secondary | ICD-10-CM

## 2019-09-20 LAB — CBC
Hematocrit: 30.4 % — ABNORMAL LOW (ref 34.0–46.6)
Hemoglobin: 10.2 g/dL — ABNORMAL LOW (ref 11.1–15.9)
MCH: 29.7 pg (ref 26.6–33.0)
MCHC: 33.6 g/dL (ref 31.5–35.7)
MCV: 89 fL (ref 79–97)
Platelets: 352 10*3/uL (ref 150–450)
RBC: 3.43 x10E6/uL — ABNORMAL LOW (ref 3.77–5.28)
RDW: 13.8 % (ref 11.7–15.4)
WBC: 5.1 10*3/uL (ref 3.4–10.8)

## 2019-09-20 MED ORDER — HYDROCORTISONE (PERIANAL) 2.5 % EX CREA: 1.0000 "application " | TOPICAL_CREAM | Freq: Two times a day (BID) | CUTANEOUS | 0 refills | Status: DC

## 2019-09-20 MED FILL — GABAPENTIN 300 MG CAPSULE: 300 | 90 days supply | Qty: 270 | Fill #0

## 2019-09-20 MED FILL — PROCTOZONE-HC 2.5 % CREA: 2.5 | 10 days supply | Qty: 30 | Fill #0

## 2019-09-20 NOTE — Progress Notes (Signed)
9/7/20219:49 AM  Kristina Flores 04-27-1960, 59 y.o., female [id number]  Chief Complaint  Patient presents with  . Bloated    feels full all the time, bowel movement normal, also has hemorroids. Taking perp h, suppositories otc. On set 2 wk ago    HPI:   Patient is a 59 y.o. female  who presents today for bloating  For past 1-2 weeks has been having bloating, abd hard, sob, she feels that she has gained about 5 lbs, her hemorrhoids have flared up, she denies any changes in BM, denies constipation or having to strain. Her stools are normally watery, multiple times each morning. No blood. Had one black tarry stool in setting of pepto which did not help No abd pain, no nausea, no vomiting, having early satiety, no pelvic pain, normal urination, no vaginal bleeding. No edema, no reflux She denies any relationship to food. She has tried peptobismol, gas x, simethicone, no changes in food or supplements  Seen in UC may 2021 for stabbing abd pain - thought to be VGE Cbc, cmp and lipase normal Colonoscopy aug 2020   Wt Readings from Last 3 Encounters:  09/20/19 158 lb (71.7 kg)  08/30/19 150 lb (68 kg)  08/13/19 150 lb (68 kg)    Depression screen [facility] 2/9 02/28/2019 07/10/2017 06/01/2017  Decreased Interest 0 0 0  Down, Depressed, Hopeless 0 0 0  PHQ - 2 Score 0 0 0    Fall Risk  09/20/2019 02/28/2019 02/26/2018 07/10/2017 06/01/2017  Falls in the past year? 0 0 0 No No  Number falls in past yr: 0 0 - - -  Injury with Fall? 0 0 0 - -     No Known Allergies  Prior to Admission medications   Medication Sig Start Date End Date Taking? Authorizing Provider  Alpha-Lipoic Acid 600 MG CAPS Take by mouth.   Yes [provider]  Calcium Carbonate-Vitamin D 600-200 MG-UNIT TABS Take by mouth.   Yes [provider]  cetirizine (ZYRTEC) 10 MG chewable tablet Chew 10 mg by mouth daily.   Yes [provider]  Cholecalciferol 1000 UNITS capsule Take 1,000 Units by  mouth daily.   Yes [provider]  Ferrous Sulfate (IRON PO) Take by mouth. EVERY WED MORNING   Yes [provider]  gabapentin (NEURONTIN) 300 MG capsule Take 300 mg by mouth 3 (three) times daily.    Yes [provider]  ibuprofen (ADVIL) 200 MG tablet Take 200 mg by mouth every 6 (six) hours as needed. Patient takes 3 tablets daily. 600 mg total. PRN   Yes [provider]  Omega-3 Fatty Acids (FISH OIL) 1000 MG CAPS Take 1 capsule by mouth daily.   Yes [provider]  vitamin B-12 (CYANOCOBALAMIN) 1000 MCG tablet Take 1,000 mcg by mouth daily.   Yes [provider]    Past Medical History:  Diagnosis Date  . Allergy   . Heart murmur   . Numbness and tingling   . Osteopenia 08/2016   T score -1.6 FRAX 7.8%/0.7%  . Peripheral neuropathy, idiopathic     Past Surgical History:  Procedure Laterality Date  . fibroid in 2011,2003    . miscarriage 1989    . ovarian cyst 1992      Social History   Tobacco Use  . Smoking status: Never Smoker  . Smokeless tobacco: Never Used  Substance Use Topics  . Alcohol use: Yes    Family History  Problem Relation  Age of Onset  . Cancer Mother        leukemia  . Colon cancer Father 68  . Cancer Father        prostate cancer, colon cancer  . Arthritis Father   . Colon cancer Paternal Uncle   . Esophageal cancer Neg Hx   . Rectal cancer Neg Hx   . Stomach cancer Neg Hx     ROS Per hpi  OBJECTIVE:  Today's Vitals   09/20/19 1006  BP: 137/87  Pulse: 81  Temp: 98.6 F (37 C)  SpO2: 98%  Weight: 158 lb (71.7 kg)  Height: 5\' 4"  (1.626 m)   Body mass index is 27.12 kg/m.   Physical Exam Vitals and nursing note reviewed. Exam conducted with a chaperone present.  Constitutional:      Appearance: She is well-developed.  HENT:     Head: Normocephalic and atraumatic.     Mouth/Throat:     Pharynx: No oropharyngeal exudate.  Eyes:     General: No scleral icterus.     Conjunctiva/sclera: Conjunctivae normal.     Pupils: Pupils are equal, round, and reactive to light.  Cardiovascular:     Rate and Rhythm: Normal rate and regular rhythm.     Heart sounds: Normal heart sounds. No murmur heard.  No friction rub. No gallop.   Pulmonary:     Effort: Pulmonary effort is normal.     Breath sounds: Normal breath sounds. No wheezing or rales.  Abdominal:     General: Bowel sounds are decreased. There is no distension.     Palpations: Abdomen is soft. There is no hepatomegaly or splenomegaly.     Tenderness: There is abdominal tenderness in the right upper quadrant and epigastric area. There is no guarding or rebound. Negative signs include Murphy's sign.  Genitourinary:    Uterus: Normal.      Adnexa: Right adnexa normal and left adnexa normal.     Rectum: External hemorrhoid present.  Musculoskeletal:     Cervical back: Neck supple.     Right lower leg: No edema.     Left lower leg: No edema.  Skin:    General: Skin is warm and dry.  Neurological:     Mental Status: She is alert and oriented to person, place, and time.     No results found for this or any previous visit (from the past 24 hour(s)).  No results found.   ASSESSMENT and PLAN  1. Abdominal bloating No red flag signs.CBC to r/o anemia (would then check celiac) Discussed supportive care, managing constipation, avoidance of gas producing foods (provided handout). Consider GI referral. RTC precautions given.  - DG Abd 2 Views - pending official read, my impression mod stool - CBC  Other orders - hydrocortisone (ANUSOL-HC) 2.5 % rectal cream; Place 1 application rectally 2 (two) times daily.  No follow-ups on file.    Rutherford Guys, MD [facility] [place], [place] [address] Ph.  [phone] Fax [phone]

## 2019-09-20 NOTE — Patient Instructions (Signed)
PCP      If you have lab work done today you will be contacted with your lab results within the next 2 weeks.  If you have not heard from [place] then please contact us. The fastest way to get your results is to register for My Chart.   IF you received an x-ray today, you will receive an invoice from [facility]. Please contact [facility] at [phone] with questions or concerns regarding your invoice.   IF you received labwork today, you will receive an invoice from [facility]. Please contact [facility] at [phone] with questions or concerns regarding your invoice.   Our billing staff will not be able to assist you with questions regarding bills from these companies.  You will be contacted with the lab results as soon as they are available. The fastest way to get your results is to activate your My Chart account. Instructions are located on the last page of this paperwork. If you have not heard from [place] regarding the results in 2 weeks, please contact this office.

## 2019-09-21 ENCOUNTER — Encounter: Payer: Self-pay | Admitting: Family Medicine

## 2019-09-22 NOTE — Addendum Note (Signed)
Addended by: Rutherford Guys on: 09/22/2019 09:23 AM   Modules accepted: Orders

## 2019-09-27 ENCOUNTER — Ambulatory Visit: Payer: Self-pay

## 2019-09-27 DIAGNOSIS — R140 Abdominal distension (gaseous): Secondary | ICD-10-CM | POA: Diagnosis not present

## 2019-09-27 NOTE — Telephone Encounter (Signed)
Patient called fron [place] stating that she feels SOB with minimal activity.  She states she is fine while at rest.  She is at [facility] park in [place]  She states the altitude is 4 to 5 thousand feet and fry conditions.  She has no other symptoms except she states that her abdomin is tight and swollen.  No cough. No fever. She reports that she is having normal BM's. She has just traveled by plane to this destination Sunday.  She denies swelling of her extremities.  She does report leg cramps. Per protocol patient will seek care at [facility] in area. Care advice read to patient.  She verbalized understanding of all information.  Reason for Disposition . [1] MILD difficulty breathing (e.g., minimal/no SOB at rest, SOB with walking, pulse <100) AND [2] NEW-onset or WORSE than normal  Answer Assessment - Initial Assessment Questions 1. RESPIRATORY STATUS: "Describe your breathing?" (e.g., wheezing, shortness of breath, unable to speak, severe coughing)      SOB 2. ONSET: "When did this breathing problem begin?"      Sunday at airport has had in past 3. PATTERN "Does the difficult breathing come and go, or has it been constant since it started?"      Only with exertion 4. SEVERITY: "How bad is your breathing?" (e.g., mild, moderate, severe)    - MILD: No SOB at rest, mild SOB with walking, speaks normally in sentences, can lay down, no retractions, pulse < 100.    - MODERATE: SOB at rest, SOB with minimal exertion and prefers to sit, cannot lie down flat, speaks in phrases, mild retractions, audible wheezing, pulse 100-120.    - SEVERE: Very SOB at rest, speaks in single words, struggling to breathe, sitting hunched forward, retractions, pulse > 120      Mild but minimal exertion causes SOB 5. RECURRENT SYMPTOM: "Have you had difficulty breathing before?" If Yes, ask: "When was the last time?" and "What happened that time?"      Yes going up stairs at home 6. CARDIAC HISTORY: "Do you have any history of  heart disease?" (e.g., heart attack, angina, bypass surgery, angioplasty)      no 7. LUNG HISTORY: "Do you have any history of lung disease?"  (e.g., pulmonary embolus, asthma, emphysema)     No but had traveled to [name] 8. CAUSE: "What do you think is causing the breathing problem?"      Unsure possible altitude and dry enviroment 9. OTHER SYMPTOMS: "Do you have any other symptoms? (e.g., dizziness, runny nose, cough, chest pain, fever)    Abdomin is distended 10. PREGNANCY: "Is there any chance you are pregnant?" "When was your last menstrual period?"       N/A 11. TRAVEL: "Have you traveled out of the country in the last month?" (e.g., travel history, exposures)       [place]  Protocols used: BREATHING DIFFICULTY-A-AH

## 2019-09-29 DIAGNOSIS — D649 Anemia, unspecified: Secondary | ICD-10-CM | POA: Diagnosis not present

## 2019-09-29 DIAGNOSIS — R188 Other ascites: Secondary | ICD-10-CM | POA: Diagnosis not present

## 2019-09-29 DIAGNOSIS — R918 Other nonspecific abnormal finding of lung field: Secondary | ICD-10-CM | POA: Diagnosis not present

## 2019-09-29 DIAGNOSIS — R1084 Generalized abdominal pain: Secondary | ICD-10-CM | POA: Diagnosis not present

## 2019-09-29 DIAGNOSIS — C786 Secondary malignant neoplasm of retroperitoneum and peritoneum: Secondary | ICD-10-CM | POA: Diagnosis not present

## 2019-09-29 DIAGNOSIS — N838 Other noninflammatory disorders of ovary, fallopian tube and broad ligament: Secondary | ICD-10-CM | POA: Diagnosis not present

## 2019-09-29 DIAGNOSIS — Z66 Do not resuscitate: Secondary | ICD-10-CM | POA: Diagnosis not present

## 2019-09-29 DIAGNOSIS — R58 Hemorrhage, not elsewhere classified: Secondary | ICD-10-CM | POA: Diagnosis not present

## 2019-09-29 DIAGNOSIS — Z20822 Contact with and (suspected) exposure to covid-19: Secondary | ICD-10-CM | POA: Diagnosis not present

## 2019-09-29 DIAGNOSIS — C569 Malignant neoplasm of unspecified ovary: Secondary | ICD-10-CM | POA: Diagnosis not present

## 2019-09-29 DIAGNOSIS — R2689 Other abnormalities of gait and mobility: Secondary | ICD-10-CM | POA: Diagnosis not present

## 2019-09-29 DIAGNOSIS — K2960 Other gastritis without bleeding: Secondary | ICD-10-CM | POA: Diagnosis not present

## 2019-09-29 DIAGNOSIS — K319 Disease of stomach and duodenum, unspecified: Secondary | ICD-10-CM | POA: Diagnosis not present

## 2019-09-29 DIAGNOSIS — C7800 Secondary malignant neoplasm of unspecified lung: Secondary | ICD-10-CM | POA: Diagnosis not present

## 2019-09-29 DIAGNOSIS — K2100 Gastro-esophageal reflux disease with esophagitis, without bleeding: Secondary | ICD-10-CM | POA: Diagnosis not present

## 2019-09-29 DIAGNOSIS — K209 Esophagitis, unspecified without bleeding: Secondary | ICD-10-CM | POA: Diagnosis not present

## 2019-09-29 DIAGNOSIS — Z9189 Other specified personal risk factors, not elsewhere classified: Secondary | ICD-10-CM | POA: Diagnosis not present

## 2019-09-29 DIAGNOSIS — I2699 Other pulmonary embolism without acute cor pulmonale: Secondary | ICD-10-CM | POA: Diagnosis not present

## 2019-09-29 DIAGNOSIS — K921 Melena: Secondary | ICD-10-CM | POA: Diagnosis not present

## 2019-09-29 DIAGNOSIS — K208 Other esophagitis without bleeding: Secondary | ICD-10-CM | POA: Diagnosis not present

## 2019-09-29 DIAGNOSIS — Z515 Encounter for palliative care: Secondary | ICD-10-CM | POA: Diagnosis not present

## 2019-09-29 DIAGNOSIS — C482 Malignant neoplasm of peritoneum, unspecified: Secondary | ICD-10-CM | POA: Diagnosis not present

## 2019-09-29 DIAGNOSIS — K922 Gastrointestinal hemorrhage, unspecified: Secondary | ICD-10-CM | POA: Diagnosis not present

## 2019-09-29 DIAGNOSIS — I739 Peripheral vascular disease, unspecified: Secondary | ICD-10-CM | POA: Diagnosis not present

## 2019-09-29 DIAGNOSIS — I82409 Acute embolism and thrombosis of unspecified deep veins of unspecified lower extremity: Secondary | ICD-10-CM | POA: Diagnosis not present

## 2019-09-29 DIAGNOSIS — K920 Hematemesis: Secondary | ICD-10-CM | POA: Diagnosis not present

## 2019-09-29 DIAGNOSIS — I82403 Acute embolism and thrombosis of unspecified deep veins of lower extremity, bilateral: Secondary | ICD-10-CM | POA: Diagnosis not present

## 2019-09-29 DIAGNOSIS — T451X5A Adverse effect of antineoplastic and immunosuppressive drugs, initial encounter: Secondary | ICD-10-CM | POA: Diagnosis not present

## 2019-09-29 DIAGNOSIS — R011 Cardiac murmur, unspecified: Secondary | ICD-10-CM | POA: Diagnosis not present

## 2019-09-29 DIAGNOSIS — K254 Chronic or unspecified gastric ulcer with hemorrhage: Secondary | ICD-10-CM | POA: Diagnosis not present

## 2019-09-29 DIAGNOSIS — R1900 Intra-abdominal and pelvic swelling, mass and lump, unspecified site: Secondary | ICD-10-CM | POA: Diagnosis not present

## 2019-09-29 DIAGNOSIS — K449 Diaphragmatic hernia without obstruction or gangrene: Secondary | ICD-10-CM | POA: Diagnosis not present

## 2019-09-29 DIAGNOSIS — D62 Acute posthemorrhagic anemia: Secondary | ICD-10-CM | POA: Diagnosis not present

## 2019-09-29 DIAGNOSIS — R0902 Hypoxemia: Secondary | ICD-10-CM | POA: Diagnosis not present

## 2019-09-29 DIAGNOSIS — J9811 Atelectasis: Secondary | ICD-10-CM | POA: Diagnosis not present

## 2019-09-29 DIAGNOSIS — R933 Abnormal findings on diagnostic imaging of other parts of digestive tract: Secondary | ICD-10-CM | POA: Diagnosis not present

## 2019-09-30 DIAGNOSIS — K254 Chronic or unspecified gastric ulcer with hemorrhage: Secondary | ICD-10-CM | POA: Diagnosis not present

## 2019-09-30 DIAGNOSIS — J9811 Atelectasis: Secondary | ICD-10-CM | POA: Diagnosis not present

## 2019-09-30 DIAGNOSIS — I2699 Other pulmonary embolism without acute cor pulmonale: Secondary | ICD-10-CM | POA: Diagnosis not present

## 2019-09-30 DIAGNOSIS — R918 Other nonspecific abnormal finding of lung field: Secondary | ICD-10-CM | POA: Diagnosis not present

## 2019-09-30 DIAGNOSIS — C786 Secondary malignant neoplasm of retroperitoneum and peritoneum: Secondary | ICD-10-CM | POA: Diagnosis not present

## 2019-09-30 DIAGNOSIS — C7800 Secondary malignant neoplasm of unspecified lung: Secondary | ICD-10-CM | POA: Diagnosis not present

## 2019-09-30 DIAGNOSIS — R188 Other ascites: Secondary | ICD-10-CM | POA: Diagnosis not present

## 2019-09-30 DIAGNOSIS — I82403 Acute embolism and thrombosis of unspecified deep veins of lower extremity, bilateral: Secondary | ICD-10-CM | POA: Diagnosis not present

## 2019-09-30 DIAGNOSIS — C569 Malignant neoplasm of unspecified ovary: Secondary | ICD-10-CM | POA: Diagnosis not present

## 2019-09-30 DIAGNOSIS — N838 Other noninflammatory disorders of ovary, fallopian tube and broad ligament: Secondary | ICD-10-CM | POA: Diagnosis not present

## 2019-09-30 DIAGNOSIS — D62 Acute posthemorrhagic anemia: Secondary | ICD-10-CM | POA: Diagnosis not present

## 2019-10-02 DIAGNOSIS — K922 Gastrointestinal hemorrhage, unspecified: Secondary | ICD-10-CM | POA: Diagnosis not present

## 2019-10-02 DIAGNOSIS — K319 Disease of stomach and duodenum, unspecified: Secondary | ICD-10-CM | POA: Diagnosis not present

## 2019-10-02 DIAGNOSIS — K921 Melena: Secondary | ICD-10-CM | POA: Diagnosis not present

## 2019-10-02 DIAGNOSIS — K920 Hematemesis: Secondary | ICD-10-CM | POA: Diagnosis not present

## 2019-10-02 DIAGNOSIS — K2960 Other gastritis without bleeding: Secondary | ICD-10-CM | POA: Diagnosis not present

## 2019-10-02 DIAGNOSIS — K2100 Gastro-esophageal reflux disease with esophagitis, without bleeding: Secondary | ICD-10-CM | POA: Diagnosis not present

## 2019-10-03 ENCOUNTER — Telehealth: Payer: Self-pay | Admitting: *Deleted

## 2019-10-03 DIAGNOSIS — K208 Other esophagitis without bleeding: Secondary | ICD-10-CM | POA: Diagnosis not present

## 2019-10-03 DIAGNOSIS — I2699 Other pulmonary embolism without acute cor pulmonale: Secondary | ICD-10-CM | POA: Diagnosis not present

## 2019-10-03 DIAGNOSIS — R933 Abnormal findings on diagnostic imaging of other parts of digestive tract: Secondary | ICD-10-CM | POA: Diagnosis not present

## 2019-10-03 DIAGNOSIS — C482 Malignant neoplasm of peritoneum, unspecified: Secondary | ICD-10-CM | POA: Diagnosis not present

## 2019-10-03 DIAGNOSIS — R918 Other nonspecific abnormal finding of lung field: Secondary | ICD-10-CM | POA: Diagnosis not present

## 2019-10-03 DIAGNOSIS — R1084 Generalized abdominal pain: Secondary | ICD-10-CM | POA: Diagnosis not present

## 2019-10-03 DIAGNOSIS — N838 Other noninflammatory disorders of ovary, fallopian tube and broad ligament: Secondary | ICD-10-CM | POA: Diagnosis not present

## 2019-10-03 NOTE — Telephone Encounter (Signed)
Patient husband [name] called to update you patient health. They are currently in [name] was on vacation, went Urgent Care for shortness of breath, patient has been admitted to hospital since 9/16/21due blood clots in legs and lungs, had ultrasound which found ovarian mass. Patient husband Leonor Liv states the providers in [place] are trying to get a consult with GYN-oncologist in [facility]. Patient husband said if you would like to speak with him you may call his cell at [phone]. Due to patient's health he is not sure when they will be able to travel back to [place].

## 2019-10-04 DIAGNOSIS — C786 Secondary malignant neoplasm of retroperitoneum and peritoneum: Secondary | ICD-10-CM | POA: Diagnosis not present

## 2019-10-04 DIAGNOSIS — J9811 Atelectasis: Secondary | ICD-10-CM | POA: Diagnosis not present

## 2019-10-04 DIAGNOSIS — R188 Other ascites: Secondary | ICD-10-CM | POA: Diagnosis not present

## 2019-10-04 DIAGNOSIS — I2699 Other pulmonary embolism without acute cor pulmonale: Secondary | ICD-10-CM | POA: Diagnosis not present

## 2019-10-04 DIAGNOSIS — C569 Malignant neoplasm of unspecified ovary: Secondary | ICD-10-CM | POA: Diagnosis not present

## 2019-10-04 DIAGNOSIS — D62 Acute posthemorrhagic anemia: Secondary | ICD-10-CM | POA: Diagnosis not present

## 2019-10-04 DIAGNOSIS — C7800 Secondary malignant neoplasm of unspecified lung: Secondary | ICD-10-CM | POA: Diagnosis not present

## 2019-10-04 DIAGNOSIS — I82403 Acute embolism and thrombosis of unspecified deep veins of lower extremity, bilateral: Secondary | ICD-10-CM | POA: Diagnosis not present

## 2019-10-04 DIAGNOSIS — K254 Chronic or unspecified gastric ulcer with hemorrhage: Secondary | ICD-10-CM | POA: Diagnosis not present

## 2019-10-04 NOTE — Telephone Encounter (Signed)
I talked with Kristina Flores's husband, yesterday.  He will call me when he has the Cytology/Pathology report on the Ovarian Cyst/Mass and when he knows the date of their return to [place].

## 2019-10-05 DIAGNOSIS — C786 Secondary malignant neoplasm of retroperitoneum and peritoneum: Secondary | ICD-10-CM | POA: Diagnosis not present

## 2019-10-05 DIAGNOSIS — I2699 Other pulmonary embolism without acute cor pulmonale: Secondary | ICD-10-CM | POA: Diagnosis not present

## 2019-10-05 DIAGNOSIS — I82403 Acute embolism and thrombosis of unspecified deep veins of lower extremity, bilateral: Secondary | ICD-10-CM | POA: Diagnosis not present

## 2019-10-05 DIAGNOSIS — C569 Malignant neoplasm of unspecified ovary: Secondary | ICD-10-CM | POA: Diagnosis not present

## 2019-10-05 DIAGNOSIS — J9811 Atelectasis: Secondary | ICD-10-CM | POA: Diagnosis not present

## 2019-10-05 DIAGNOSIS — R188 Other ascites: Secondary | ICD-10-CM | POA: Diagnosis not present

## 2019-10-05 DIAGNOSIS — C7800 Secondary malignant neoplasm of unspecified lung: Secondary | ICD-10-CM | POA: Diagnosis not present

## 2019-10-05 DIAGNOSIS — K254 Chronic or unspecified gastric ulcer with hemorrhage: Secondary | ICD-10-CM | POA: Diagnosis not present

## 2019-10-05 DIAGNOSIS — D62 Acute posthemorrhagic anemia: Secondary | ICD-10-CM | POA: Diagnosis not present

## 2019-10-07 ENCOUNTER — Other Ambulatory Visit: Payer: Self-pay | Admitting: *Deleted

## 2019-10-07 DIAGNOSIS — D62 Acute posthemorrhagic anemia: Secondary | ICD-10-CM | POA: Diagnosis not present

## 2019-10-07 DIAGNOSIS — I82403 Acute embolism and thrombosis of unspecified deep veins of lower extremity, bilateral: Secondary | ICD-10-CM | POA: Diagnosis not present

## 2019-10-07 DIAGNOSIS — C786 Secondary malignant neoplasm of retroperitoneum and peritoneum: Secondary | ICD-10-CM | POA: Diagnosis not present

## 2019-10-07 DIAGNOSIS — J9811 Atelectasis: Secondary | ICD-10-CM | POA: Diagnosis not present

## 2019-10-07 DIAGNOSIS — I2699 Other pulmonary embolism without acute cor pulmonale: Secondary | ICD-10-CM | POA: Diagnosis not present

## 2019-10-07 DIAGNOSIS — C569 Malignant neoplasm of unspecified ovary: Secondary | ICD-10-CM | POA: Diagnosis not present

## 2019-10-07 DIAGNOSIS — K254 Chronic or unspecified gastric ulcer with hemorrhage: Secondary | ICD-10-CM | POA: Diagnosis not present

## 2019-10-07 DIAGNOSIS — C7800 Secondary malignant neoplasm of unspecified lung: Secondary | ICD-10-CM | POA: Diagnosis not present

## 2019-10-07 DIAGNOSIS — R188 Other ascites: Secondary | ICD-10-CM | POA: Diagnosis not present

## 2019-10-07 NOTE — Patient Outreach (Signed)
[facility]) Care Management  10/07/2019  Kristina Flores 04-17-1960 [id number]   Transition of care   Referral received: 10/05/19 Insurance: [employer]    Referral for transition of care received 9/22, noted patient admitted at [facility], [name]  on 9/17.  As of 9/24 patient remains inpatient at [facility] Will continue to follow for transition of care at discharge.    Joylene Draft, RN, BSN  [place] Management Coordinator  [phone]- Mobile [phone]- Toll Free Main Office

## 2019-10-08 DIAGNOSIS — D62 Acute posthemorrhagic anemia: Secondary | ICD-10-CM | POA: Diagnosis not present

## 2019-10-08 DIAGNOSIS — R011 Cardiac murmur, unspecified: Secondary | ICD-10-CM | POA: Diagnosis not present

## 2019-10-08 DIAGNOSIS — K254 Chronic or unspecified gastric ulcer with hemorrhage: Secondary | ICD-10-CM | POA: Diagnosis not present

## 2019-10-08 DIAGNOSIS — R1900 Intra-abdominal and pelvic swelling, mass and lump, unspecified site: Secondary | ICD-10-CM | POA: Diagnosis not present

## 2019-10-08 DIAGNOSIS — D649 Anemia, unspecified: Secondary | ICD-10-CM | POA: Diagnosis not present

## 2019-10-08 DIAGNOSIS — I82403 Acute embolism and thrombosis of unspecified deep veins of lower extremity, bilateral: Secondary | ICD-10-CM | POA: Diagnosis not present

## 2019-10-08 DIAGNOSIS — I2699 Other pulmonary embolism without acute cor pulmonale: Secondary | ICD-10-CM | POA: Diagnosis not present

## 2019-10-08 DIAGNOSIS — I82409 Acute embolism and thrombosis of unspecified deep veins of unspecified lower extremity: Secondary | ICD-10-CM | POA: Diagnosis not present

## 2019-10-08 DIAGNOSIS — R918 Other nonspecific abnormal finding of lung field: Secondary | ICD-10-CM | POA: Diagnosis not present

## 2019-10-11 DIAGNOSIS — R1900 Intra-abdominal and pelvic swelling, mass and lump, unspecified site: Secondary | ICD-10-CM | POA: Diagnosis not present

## 2019-10-11 DIAGNOSIS — D62 Acute posthemorrhagic anemia: Secondary | ICD-10-CM | POA: Diagnosis not present

## 2019-10-11 DIAGNOSIS — I82409 Acute embolism and thrombosis of unspecified deep veins of unspecified lower extremity: Secondary | ICD-10-CM | POA: Diagnosis not present

## 2019-10-11 DIAGNOSIS — R011 Cardiac murmur, unspecified: Secondary | ICD-10-CM | POA: Diagnosis not present

## 2019-10-11 DIAGNOSIS — J9811 Atelectasis: Secondary | ICD-10-CM | POA: Diagnosis not present

## 2019-10-11 DIAGNOSIS — I82403 Acute embolism and thrombosis of unspecified deep veins of lower extremity, bilateral: Secondary | ICD-10-CM | POA: Diagnosis not present

## 2019-10-11 DIAGNOSIS — K254 Chronic or unspecified gastric ulcer with hemorrhage: Secondary | ICD-10-CM | POA: Diagnosis not present

## 2019-10-11 DIAGNOSIS — R2689 Other abnormalities of gait and mobility: Secondary | ICD-10-CM | POA: Diagnosis not present

## 2019-10-11 DIAGNOSIS — D649 Anemia, unspecified: Secondary | ICD-10-CM | POA: Diagnosis not present

## 2019-10-11 DIAGNOSIS — I2699 Other pulmonary embolism without acute cor pulmonale: Secondary | ICD-10-CM | POA: Diagnosis not present

## 2019-10-11 NOTE — Telephone Encounter (Addendum)
Patient husband called back to update you with patient health and discuss gyn-oncology referral. Some of the records came from hospital I placed on your desk for you to review and get back with me about the referral.  Patient is expected to be discharged/flight home on 10/15/19. She did have her 1st round of chemo on 10/07/19 next round should be done in 3 weeks per patient husband.

## 2019-10-13 ENCOUNTER — Encounter: Payer: 59 | Admitting: Obstetrics & Gynecology

## 2019-10-14 ENCOUNTER — Telehealth: Payer: Self-pay | Admitting: Oncology

## 2019-10-14 ENCOUNTER — Encounter: Payer: Self-pay | Admitting: Hematology and Oncology

## 2019-10-14 ENCOUNTER — Encounter: Payer: Self-pay | Admitting: Oncology

## 2019-10-14 ENCOUNTER — Ambulatory Visit
Admission: RE | Admit: 2019-10-14 | Discharge: 2019-10-14 | Disposition: A | Discharge status: Home / Self Care | Payer: Self-pay | Source: Ambulatory Visit | Attending: Gynecologic Oncology | Admitting: Gynecologic Oncology

## 2019-10-14 DIAGNOSIS — C569 Malignant neoplasm of unspecified ovary: Secondary | ICD-10-CM | POA: Insufficient documentation

## 2019-10-14 DIAGNOSIS — R1900 Intra-abdominal and pelvic swelling, mass and lump, unspecified site: Secondary | ICD-10-CM

## 2019-10-14 DIAGNOSIS — D62 Acute posthemorrhagic anemia: Secondary | ICD-10-CM | POA: Diagnosis not present

## 2019-10-14 DIAGNOSIS — R188 Other ascites: Secondary | ICD-10-CM | POA: Diagnosis not present

## 2019-10-14 DIAGNOSIS — I2699 Other pulmonary embolism without acute cor pulmonale: Secondary | ICD-10-CM | POA: Diagnosis not present

## 2019-10-14 DIAGNOSIS — I82403 Acute embolism and thrombosis of unspecified deep veins of lower extremity, bilateral: Secondary | ICD-10-CM | POA: Diagnosis not present

## 2019-10-14 DIAGNOSIS — C7800 Secondary malignant neoplasm of unspecified lung: Secondary | ICD-10-CM | POA: Diagnosis not present

## 2019-10-14 DIAGNOSIS — J9811 Atelectasis: Secondary | ICD-10-CM | POA: Diagnosis not present

## 2019-10-14 DIAGNOSIS — C786 Secondary malignant neoplasm of retroperitoneum and peritoneum: Secondary | ICD-10-CM | POA: Diagnosis not present

## 2019-10-14 DIAGNOSIS — K254 Chronic or unspecified gastric ulcer with hemorrhage: Secondary | ICD-10-CM | POA: Diagnosis not present

## 2019-10-14 NOTE — Progress Notes (Signed)
Faxed request for chemotherapy administration records to [facility] at [phone].

## 2019-10-14 NOTE — Progress Notes (Signed)
Rob called back and confirmed appointments for 10/11 and 10/15.  He will also drop off a copy of all her records and a CD with imaging on Monday.

## 2019-10-14 NOTE — Telephone Encounter (Signed)
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  She is being discharged from the hospital tomorrow and will also fly back to [facility]  Her first round of chemotherapy was 10/07/19 and he thinks it was carboplatin/taxol. Advised him that I will coordinate having her scheduled with Dr. Alvy Bimler and Dr. Denman George as soon as possible.  Also asked that he try to get a CD of her imaging before they are discharged.  He is going to find the number for medical records and call back.

## 2019-10-14 NOTE — Telephone Encounter (Signed)
Dr.Lavoie update Kristina Flores from Gyn-oncology reached out to me and asked to fax the medical records to [phone]. Records has been faxed she is trying to get the patient in next week for appointment at Cancer center.

## 2019-10-14 NOTE — Progress Notes (Signed)
Left a message for Rob with appointments to see Dr. Alvy Bimler on 10/24/19 and Dr. Denman George on 10/28/19.  Requested a return call to confirm.

## 2019-10-14 NOTE — Progress Notes (Signed)
Notified Rob that appointment with Dr. Alvy Bimler has been rescheduled to 11/20/2019 at 11:40 with 11:10 arrival. He verbalized understanding of new appointment time.

## 2019-10-17 ENCOUNTER — Other Ambulatory Visit (HOSPITAL_COMMUNITY): Payer: Self-pay | Admitting: Gynecologic Oncology

## 2019-10-17 ENCOUNTER — Telehealth: Payer: Self-pay | Admitting: Oncology

## 2019-10-17 ENCOUNTER — Encounter: Payer: 59 | Admitting: Obstetrics & Gynecology

## 2019-10-17 NOTE — Telephone Encounter (Addendum)
Rob called and said Jamil's toes on both feet were blue and had numbness and tingling from the blood clots.  The color is better now but she is getting blisters on a few toes.  They are trying to find out if they should see a podiatrist or vascular surgeon for this. Advised them to check with Kwana's PCP to see what they recommend.

## 2019-10-18 ENCOUNTER — Telehealth (INDEPENDENT_AMBULATORY_CARE_PROVIDER_SITE_OTHER): Payer: 59 | Admitting: Family Medicine

## 2019-10-18 ENCOUNTER — Other Ambulatory Visit: Payer: Self-pay

## 2019-10-18 ENCOUNTER — Other Ambulatory Visit: Payer: Self-pay | Admitting: *Deleted

## 2019-10-18 ENCOUNTER — Encounter: Payer: Self-pay | Admitting: Hematology and Oncology

## 2019-10-18 ENCOUNTER — Telehealth: Payer: Self-pay

## 2019-10-18 ENCOUNTER — Encounter: Payer: Self-pay | Admitting: Family Medicine

## 2019-10-18 DIAGNOSIS — I2699 Other pulmonary embolism without acute cor pulmonale: Secondary | ICD-10-CM | POA: Diagnosis not present

## 2019-10-18 DIAGNOSIS — R918 Other nonspecific abnormal finding of lung field: Secondary | ICD-10-CM | POA: Insufficient documentation

## 2019-10-18 DIAGNOSIS — Z9981 Dependence on supplemental oxygen: Secondary | ICD-10-CM | POA: Diagnosis not present

## 2019-10-18 DIAGNOSIS — C569 Malignant neoplasm of unspecified ovary: Secondary | ICD-10-CM

## 2019-10-18 DIAGNOSIS — I8290 Acute embolism and thrombosis of unspecified vein: Secondary | ICD-10-CM | POA: Diagnosis not present

## 2019-10-18 DIAGNOSIS — D61818 Other pancytopenia: Secondary | ICD-10-CM | POA: Insufficient documentation

## 2019-10-18 DIAGNOSIS — I739 Peripheral vascular disease, unspecified: Secondary | ICD-10-CM

## 2019-10-18 DIAGNOSIS — Z95828 Presence of other vascular implants and grafts: Secondary | ICD-10-CM | POA: Insufficient documentation

## 2019-10-18 DIAGNOSIS — Z86718 Personal history of other venous thrombosis and embolism: Secondary | ICD-10-CM | POA: Insufficient documentation

## 2019-10-18 DIAGNOSIS — R180 Malignant ascites: Secondary | ICD-10-CM | POA: Insufficient documentation

## 2019-10-18 DIAGNOSIS — L658 Other specified nonscarring hair loss: Secondary | ICD-10-CM | POA: Diagnosis not present

## 2019-10-18 DIAGNOSIS — T45515A Adverse effect of anticoagulants, initial encounter: Secondary | ICD-10-CM | POA: Diagnosis not present

## 2019-10-18 DIAGNOSIS — I824Z3 Acute embolism and thrombosis of unspecified deep veins of distal lower extremity, bilateral: Secondary | ICD-10-CM | POA: Insufficient documentation

## 2019-10-18 NOTE — Telephone Encounter (Signed)
Pt rx for portable o2 has been faxed to [facility] at [phone]

## 2019-10-18 NOTE — Progress Notes (Signed)
Virtual Visit Note  I connected with patient on 10/18/19 at 518pm by video epic and verified that I am speaking with the correct person using two identifiers. Kristina Flores is currently located at home and patient and her husband is currently with them during visit. The provider, Rutherford Guys, MD is located in their office at time of visit.  I discussed the limitations, risks, security and privacy concerns of performing an evaluation and management service by telephone and the availability of in person appointments. I also discussed with the patient that there may be a patient responsible charge related to this service. The patient expressed understanding and agreed to proceed.   I provided 27 minutes of non-face-to-face time during this encounter.  Chief Complaint  Patient presents with  . Follow-up    pt is requesting information on home health aid. Just recently learned that she now has ovarian cancer and blood clots, recently hospitalized while on vacation for 2 wks    HPI ? Last OV sept 7 2021 - for bloating - thought to be constipation Went to [place] - 3 flights, next day tired and SOB, noticed leg cramps, thought she had pulled a muscle, went camping and hiking, unable to do much activity wise and feeling tired and SOB She went to UC in [place] - thought constipation and pulled muscle She was not getting better, spoke with a nurse friend whom said go back to be evaluated  Seen in ER ([facility]) - bilateral DVT and PE, 17 cm ovarian mass - started bleeding on heparin ggt, almost died, hosp for 2 weeks and 2 days. Started chemo, no surgery.  On 2L of oxygen thru [facility] ([facility]), eliquis, green filter in place, using IS Has upcoming appts with Dr Denman George gyn onc, vascular surgeon, Dr Alvy Bimler onc Needs [place] for RN, PT and OT Needs rx for portable oxygen tank  medical records for [place]  [phone] fax: [phone]   No Known Allergies  Prior to Admission medications   Medication Sig Start Date End Date Taking? Authorizing Provider  Alpha-Lipoic Acid 600 MG CAPS Take by mouth.   Yes [provider]  Calcium Carbonate-Vitamin D 600-200 MG-UNIT TABS Take by mouth.   Yes [provider]  cetirizine (ZYRTEC) 10 MG chewable tablet Chew 10 mg by mouth daily.   Yes [provider]  Cholecalciferol 1000 UNITS capsule Take 1,000 Units by mouth daily.   Yes [provider]  Ferrous Sulfate (IRON PO) Take by mouth. EVERY WED MORNING   Yes [provider]  gabapentin (NEURONTIN) 300 MG capsule Take 300 mg by mouth 3 (three) times daily.    Yes [provider]  hydrocortisone (ANUSOL-HC) 2.5 % rectal cream Place 1 application rectally 2 (two) times daily. 09/20/19  Yes Rutherford Guys, MD  ibuprofen (ADVIL) 200 MG tablet Take 200 mg by mouth every 6 (six) hours as needed. Patient takes 3 tablets daily. 600 mg total. PRN   Yes [provider]  Omega-3 Fatty Acids (FISH OIL) 1000 MG CAPS Take 1 capsule by mouth daily.   Yes [provider]  vitamin B-12 (CYANOCOBALAMIN) 1000 MCG tablet Take 1,000 mcg by mouth daily.   Yes [provider]    Past Medical History:  Diagnosis Date  . Allergy   . Heart murmur   . Numbness and tingling   . Osteopenia 08/2016   T score -  1.6 FRAX 7.8%/0.7%  . Peripheral neuropathy, idiopathic     Past Surgical History:  Procedure Laterality Date  . fibroid in 2011,2003    . miscarriage 1989    . ovarian cyst 1992      Social History   Tobacco Use  . Smoking status: Never Smoker  . Smokeless tobacco: Never Used  Substance Use Topics  . Alcohol use: Yes    Family History  Problem Relation Age of Onset  . Cancer Mother        leukemia  . Colon cancer Father 59  . Cancer Father        prostate cancer, colon cancer  . Arthritis Father   . Colon cancer  Paternal Uncle   . Esophageal cancer Neg Hx   . Rectal cancer Neg Hx   . Stomach cancer Neg Hx     Review of Systems  Constitutional: Positive for malaise/fatigue. Negative for chills and fever.  Respiratory: Positive for cough and shortness of breath.   Cardiovascular: Negative for chest pain, palpitations and leg swelling.  Gastrointestinal: Negative for abdominal pain, blood in stool, melena, nausea and vomiting.  Genitourinary: Negative for hematuria.  Endo/Heme/Allergies: Does not bruise/bleed easily.     Objective  Vitals as reported by the patient: none  GEN: AAOx3, NAD HEENT: [place]/AT, pupils are symmetrical, EOMI, non-icteric sclera Resp: breathing comfortably, speaking in full sentences, wearing [place] Skin: no rashes noted, no pallor Psych: good eye contact, normal mood and affect   ASSESSMENT and PLAN  1. Malignant neoplasm of ovary, unspecified laterality ([place]) - Ambulatory referral to Home Health  2. Pulmonary embolism on long-term anticoagulation therapy ([place]) - Ambulatory referral to Home Health  3. VTE (venous thromboembolism) - Ambulatory referral to Home Health  4. Dependence on supplemental oxygen - Ambulatory referral to Home Health  Overall in good spirits, has good social support system. Medical records will be requested, has upcoming appts with all needed specialists, plans for now are chemo, tolerating eliquis well, breathing comfortably as long as using O2. Electronic order for [facility] and hand written order for portable oxygen done. Reached out to assigned new PCP.   FOLLOW-UP: as scheduled, will try for sooner appt   The above assessment and management plan was discussed with the patient. The patient verbalized understanding of and has agreed to the management plan. Patient is aware to call the clinic if symptoms persist or worsen. Patient is aware when to return to the clinic for a follow-up visit. Patient educated on when it is appropriate to go to the  emergency department.     Rutherford Guys, MD Primary Care at [place],  [address] Ph.  [phone] Fax [phone]

## 2019-10-18 NOTE — Patient Outreach (Signed)
[facility]) Care Management  10/18/2019  Kristina Flores [facility] [birth year] [id number]   Transition of care telephone call  Referral received:10/05/19 Initial outreach:10/18/19 Insurance: [facility]   Initial unsuccessful telephone call to patient's preferred number in order to complete transition of care assessment; no answer, left HIPAA compliant voicemail message requesting return call.   Objective: Received referral message for  Kristina Flores, hospitalized at [facility] in [place] 09/30/19. Reviewed Care everywhere no records Noted per Epic notes  patient with hospital discharge on 10/15/19 with return to [place],   virtual visit with PCP on 10/18/19.   Plan: This RNCM will route unsuccessful outreach letter with [place] Management pamphlet and 24 hour Nurse Advice Line Magnet to [place] Management clinical pool to be mailed to patient's home address. This RNCM will attempt another outreach within 4 business days.  Joylene Draft, RN, BSN  [place] Management Coordinator  [phone]- Mobile [phone]- Toll Free Main Office

## 2019-10-19 ENCOUNTER — Telehealth: Payer: Self-pay | Admitting: Oncology

## 2019-10-19 ENCOUNTER — Telehealth: Payer: Self-pay | Admitting: *Deleted

## 2019-10-19 ENCOUNTER — Encounter: Payer: 59 | Admitting: Obstetrics & Gynecology

## 2019-10-19 NOTE — Telephone Encounter (Signed)
Called Rob and went over instructions for appointments tomorrow.  They will arrive at 11:00 and will bring all her pill bottles.  Advised him of lab appointment to follow Dr. Calton Dach appointment and Patient Education class on Monday, 10/11.

## 2019-10-19 NOTE — Telephone Encounter (Signed)
Patient is scheduled with Dr.Rossi on 10/28/19 and medical oncology on 10/20/19 with Heath Lark, MD.  [facility] patient said she would love to speak with you when you have time. Her call back # is [phone]

## 2019-10-20 ENCOUNTER — Encounter: Payer: Self-pay | Admitting: Vascular Surgery

## 2019-10-20 ENCOUNTER — Other Ambulatory Visit: Payer: Self-pay

## 2019-10-20 ENCOUNTER — Encounter: Payer: Self-pay | Admitting: Hematology and Oncology

## 2019-10-20 ENCOUNTER — Inpatient Hospital Stay: Payer: 59

## 2019-10-20 ENCOUNTER — Ambulatory Visit: Payer: 59 | Admitting: Vascular Surgery

## 2019-10-20 ENCOUNTER — Other Ambulatory Visit: Payer: Self-pay | Admitting: Hematology and Oncology

## 2019-10-20 ENCOUNTER — Ambulatory Visit (HOSPITAL_COMMUNITY)
Admission: RE | Admit: 2019-10-20 | Discharge: 2019-10-20 | Disposition: A | Discharge status: Home / Self Care | Payer: 59 | Source: Ambulatory Visit | Attending: Vascular Surgery | Admitting: Vascular Surgery

## 2019-10-20 ENCOUNTER — Inpatient Hospital Stay: Payer: 59 | Attending: Hematology and Oncology | Admitting: Hematology and Oncology

## 2019-10-20 VITALS — BP 124/88 | HR 96 | Temp 98.2°F | Resp 20 | Ht 64.0 in | Wt 163.0 lb

## 2019-10-20 VITALS — BP 124/75 | HR 89 | Temp 99.2°F | Resp 18 | Ht 64.0 in

## 2019-10-20 DIAGNOSIS — G893 Neoplasm related pain (acute) (chronic): Secondary | ICD-10-CM | POA: Diagnosis not present

## 2019-10-20 DIAGNOSIS — D61818 Other pancytopenia: Secondary | ICD-10-CM | POA: Diagnosis not present

## 2019-10-20 DIAGNOSIS — I739 Peripheral vascular disease, unspecified: Secondary | ICD-10-CM | POA: Insufficient documentation

## 2019-10-20 DIAGNOSIS — Z95828 Presence of other vascular implants and grafts: Secondary | ICD-10-CM | POA: Insufficient documentation

## 2019-10-20 DIAGNOSIS — I75023 Atheroembolism of bilateral lower extremities: Secondary | ICD-10-CM

## 2019-10-20 DIAGNOSIS — I82403 Acute embolism and thrombosis of unspecified deep veins of lower extremity, bilateral: Secondary | ICD-10-CM | POA: Diagnosis not present

## 2019-10-20 DIAGNOSIS — C569 Malignant neoplasm of unspecified ovary: Secondary | ICD-10-CM | POA: Insufficient documentation

## 2019-10-20 DIAGNOSIS — Z5111 Encounter for antineoplastic chemotherapy: Secondary | ICD-10-CM | POA: Insufficient documentation

## 2019-10-20 DIAGNOSIS — Z23 Encounter for immunization: Secondary | ICD-10-CM | POA: Diagnosis not present

## 2019-10-20 DIAGNOSIS — I2699 Other pulmonary embolism without acute cor pulmonale: Secondary | ICD-10-CM | POA: Diagnosis not present

## 2019-10-20 DIAGNOSIS — R180 Malignant ascites: Secondary | ICD-10-CM | POA: Diagnosis not present

## 2019-10-20 DIAGNOSIS — I824Z3 Acute embolism and thrombosis of unspecified deep veins of distal lower extremity, bilateral: Secondary | ICD-10-CM

## 2019-10-20 DIAGNOSIS — R918 Other nonspecific abnormal finding of lung field: Secondary | ICD-10-CM

## 2019-10-20 DIAGNOSIS — R300 Dysuria: Secondary | ICD-10-CM

## 2019-10-20 DIAGNOSIS — G609 Hereditary and idiopathic neuropathy, unspecified: Secondary | ICD-10-CM | POA: Diagnosis not present

## 2019-10-20 DIAGNOSIS — C786 Secondary malignant neoplasm of retroperitoneum and peritoneum: Secondary | ICD-10-CM | POA: Insufficient documentation

## 2019-10-20 LAB — IRON AND TIBC
Iron: 30 ug/dL — ABNORMAL LOW (ref 41–142)
Saturation Ratios: 16 % — ABNORMAL LOW (ref 21–57)
TIBC: 194 ug/dL — ABNORMAL LOW (ref 236–444)
UIBC: 163 ug/dL (ref 120–384)

## 2019-10-20 LAB — COMPREHENSIVE METABOLIC PANEL
ALT: 36 U/L (ref 0–44)
AST: 37 U/L (ref 15–41)
Albumin: 1.8 g/dL — ABNORMAL LOW (ref 3.5–5.0)
Alkaline Phosphatase: 172 U/L — ABNORMAL HIGH (ref 38–126)
Anion gap: 6 (ref 5–15)
BUN: 6 mg/dL — ABNORMAL LOW (ref 8–23)
CO2: 32 mmol/L (ref 22–32)
Calcium: 8.1 mg/dL — ABNORMAL LOW (ref 8.9–10.3)
Chloride: 98 mmol/L (ref 98–111)
Creatinine, Ser: 0.7 mg/dL (ref 0.44–1.00)
GFR calc non Af Amer: >60 mL/min (ref >60)
Glucose, Bld: 90 mg/dL (ref 70–99)
Potassium: 3.7 mmol/L (ref 3.5–5.1)
Sodium: 136 mmol/L (ref 135–145)
Total Bilirubin: 0.5 mg/dL (ref 0.3–1.2)
Total Protein: 5.8 g/dL — ABNORMAL LOW (ref 6.5–8.1)

## 2019-10-20 LAB — CBC WITH DIFFERENTIAL/PLATELET
Abs Immature Granulocytes: 0.98 10*3/uL — ABNORMAL HIGH (ref 0.00–0.07)
Basophils Absolute: 0.1 10*3/uL (ref 0.0–0.1)
Basophils Relative: 1 %
Eosinophils Absolute: 0.1 10*3/uL (ref 0.0–0.5)
Eosinophils Relative: 1 %
HCT: 29.7 % — ABNORMAL LOW (ref 36.0–46.0)
Hemoglobin: 9.5 g/dL — ABNORMAL LOW (ref 12.0–15.0)
Immature Granulocytes: 6 %
Lymphocytes Relative: 9 %
Lymphs Abs: 1.5 10*3/uL (ref 0.7–4.0)
MCH: 28.9 pg (ref 26.0–34.0)
MCHC: 32 g/dL (ref 30.0–36.0)
MCV: 90.3 fL (ref 80.0–100.0)
Monocytes Absolute: 1 10*3/uL (ref 0.1–1.0)
Monocytes Relative: 6 %
Neutro Abs: 13.2 10*3/uL — ABNORMAL HIGH (ref 1.7–7.7)
Neutrophils Relative %: 77 %
Platelets: 181 10*3/uL (ref 150–400)
RBC: 3.29 MIL/uL — ABNORMAL LOW (ref 3.87–5.11)
RDW: 15.4 % (ref 11.5–15.5)
WBC: 16.9 10*3/uL — ABNORMAL HIGH (ref 4.0–10.5)
nRBC: 0 % (ref 0.0–0.2)

## 2019-10-20 LAB — URINALYSIS, COMPLETE (UACMP) WITH MICROSCOPIC
Bacteria, UA: NONE SEEN
Bilirubin Urine: NEGATIVE
Glucose, UA: NEGATIVE mg/dL
Hgb urine dipstick: NEGATIVE
Ketones, ur: NEGATIVE mg/dL
Leukocytes,Ua: NEGATIVE
Nitrite: NEGATIVE
Protein, ur: NEGATIVE mg/dL
Specific Gravity, Urine: 1.006 (ref 1.005–1.030)
pH: 7 (ref 5.0–8.0)

## 2019-10-20 LAB — ABO/RH: ABO/RH(D): O NEG

## 2019-10-20 LAB — VITAMIN B12: Vitamin B-12: 6752 pg/mL — ABNORMAL HIGH (ref 180–914)

## 2019-10-20 LAB — SAMPLE TO BLOOD BANK

## 2019-10-20 LAB — FERRITIN: Ferritin: 4370 ng/mL — ABNORMAL HIGH (ref 11–307)

## 2019-10-20 MED ORDER — HYDROMORPHONE HCL 4 MG PO TABS: 4.0000 mg | ORAL_TABLET | ORAL | 0 refills | Status: DC | PRN

## 2019-10-20 MED ORDER — DEXAMETHASONE 4 MG PO TABS: ORAL_TABLET | ORAL | 6 refills | Status: DC

## 2019-10-20 MED ORDER — PROCHLORPERAZINE MALEATE 10 MG PO TABS: 10.0000 mg | ORAL_TABLET | Freq: Four times a day (QID) | ORAL | 1 refills | Status: DC | PRN

## 2019-10-20 MED FILL — PROCHLORPERAZINE 10 MG TAB: 10 | 7 days supply | Qty: 30 | Fill #0

## 2019-10-20 MED FILL — DEXAMETHASONE 4 MG TABLET: 4 | 84 days supply | Qty: 16 | Fill #0

## 2019-10-20 MED FILL — HYDROmorphone HCL 4 MG TABS: 4 | 5 days supply | Qty: 30 | Fill #0

## 2019-10-20 NOTE — Assessment & Plan Note (Signed)
She received multiple units of blood transfusion in the outside facility I will order iron studies, vitamin B12 and others She does not need transfusion support today

## 2019-10-20 NOTE — Assessment & Plan Note (Signed)
She is doing well on anticoagulation therapy She will continue for minimum 3 months before interruption for interval debulking surgery

## 2019-10-20 NOTE — Assessment & Plan Note (Signed)
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

## 2019-10-20 NOTE — Assessment & Plan Note (Signed)
She has bilateral lower extremity edema and cyanotic changes consistent with signs of peripheral vascular disease She has appointment to see vascular surgery for further assessment She will continue anticoagulation therapy

## 2019-10-20 NOTE — Progress Notes (Signed)
[name] NOTE  Patient Care Team: Rutherford Guys, MD as PCP - General (Family Medicine) Alfonzo Feller, RN as [facility]  ASSESSMENT & PLAN:  Ovarian cancer [facility]) She tolerated recent chemotherapy well She is experiencing alopecia which is expected side effects of treatment I gave her prescription for cranial prosthesis She has reasonable venous access and would not need a port I plan to see her again next week for further assessment of toxicity She will get chemo education class next week along with Covid vaccine booster injection I will schedule cycle 2 of treatment to be given on October 18 We discussed neoadjuvant chemotherapy approach with minimum 3 cycles of carboplatin and paclitaxel before repeating imaging study, with further plan for chemotherapy or interval debulking surgery in the future  We reviewed the NCCN guidelines We discussed the role of chemotherapy. The intent is of curative intent.  We discussed some of the risks, benefits, side-effects of carboplatin & Taxol. Treatment is intravenous, every 3 weeks x 6 cycles  Some of the short term side-effects included, though not limited to, including weight loss, life threatening infections, risk of allergic reactions, need for transfusions of blood products, nausea, vomiting, change in bowel habits, loss of hair, admission to hospital for various reasons, and risks of death.   Long term side-effects are also discussed including risks of infertility, permanent damage to nerve function, hearing loss, chronic fatigue, kidney damage with possibility needing hemodialysis, and rare secondary malignancy including bone marrow disorders.  The patient is aware that the response rates discussed earlier is not guaranteed.  After a long discussion, patient made an informed decision to proceed with the prescribed plan of care.   Patient education material was dispensed. We  discussed premedication with dexamethasone before chemotherapy. I will schedule tumor marker monitoring monthly while she is on treatment  Acute pulmonary embolism ([place]) She is doing well on anticoagulation therapy She will continue for minimum 3 months before interruption for interval debulking surgery  Acute deep vein thrombosis of lower leg, bilateral (HCC) She has bilateral lower extremity edema and cyanotic changes consistent with signs of peripheral vascular disease She has appointment to see vascular surgery for further assessment She will continue anticoagulation therapy  Cancer associated pain We discussed pain management I warned her about risk of sedation, nausea and constipation We discussed narcotic refill policy  Dysuria I plan to order urinalysis and urine culture  Idiopathic neuropathy She is at risk of neuropathy She will continue gabapentin I recommend she consider ice treatment during chemotherapy to reduce risk of further neuropathy  Malignant ascites She has probable malignant ascites on exam She does not need paracentesis today  Multiple lung nodules on CT Her outside CT imaging is being loaded into the computer I will review her imaging study and discussed the case at the GYN oncology tumor board next week Currently, she is not symptomatic I recommend we start weaning her off oxygen during daytime if possible  Pancytopenia, acquired ([place]) She received multiple units of blood transfusion in the outside facility I will order iron studies, vitamin B12 and others She does not need transfusion support today  S/P IVC filter She has appointment to see vascular surgery We will consider IVC filter removal in the future   Orders Placed This Encounter  Procedures  . Urine Culture    Standing Status:   Future    Number of Occurrences:   1    Standing Expiration  Date:   10/19/2020  . CBC with Differential/Platelet    Standing Status:   Standing    Number  of Occurrences:   22    Standing Expiration Date:   10/18/2020  . Comprehensive metabolic panel    Standing Status:   Standing    Number of Occurrences:   33    Standing Expiration Date:   10/18/2020  . Vitamin B12    Standing Status:   Future    Number of Occurrences:   1    Standing Expiration Date:   10/18/2020  . Iron and TIBC    Standing Status:   Future    Number of Occurrences:   1    Standing Expiration Date:   10/18/2020  . Ferritin    Standing Status:   Future    Number of Occurrences:   1    Standing Expiration Date:   10/18/2020  . CA 125    Standing Status:   Standing    Number of Occurrences:   11    Standing Expiration Date:   10/18/2020  . Urinalysis, Complete w Microscopic    Standing Status:   Future    Number of Occurrences:   1    Standing Expiration Date:   10/19/2020  . ABO/RH    Standing Status:   Standing    Number of Occurrences:   1    Standing Expiration Date:   10/18/2020  . Sample to Blood Bank    Standing Status:   Standing    Number of Occurrences:   33    Standing Expiration Date:   10/19/2020    The total time spent in the appointment was 90 minutes encounter with patients including review of chart and various tests results, discussions about plan of care and coordination of care plan   All questions were answered. The patient knows to call the clinic with any problems, questions or concerns. No barriers to learning was detected.  Kristina Lark, MD 10/7/20212:54 PM  CHIEF COMPLAINTS/PURPOSE OF CONSULTATION:  Recent diagnosis of ovarian cancer, status post chemotherapy  HISTORY OF PRESENTING ILLNESS:  Kristina Flores 59 y.o. female is here because of recent diagnosis of ovarian cancer I have reviewed almost 200 pages of outside records and collaborated the history with the patient and her husband, Kristina Flores The patient is a retired housewife She has 1 son and 2 daughters The patient was a regular blood donor and has been very active with all  activities of daily living She went through menopause approximately 10 years ago She never have abnormal Pap smear In 2013, she had transient episode of breakthrough postmenopausal bleeding that was evaluated by her gynecologist Early this year around May, she went to urgent care for abdominal pain Prior to leaving for vacation in [name], she saw her primary care doctor for sensation of bloating Her abdominal x-ray at that time was unremarkable and she was prescribed something for hemorrhoids Her plane trip requires 3 different stops.  She was camping in the [facility] [facility] [place] for 3 days before presenting to the local emergency department for severe bilateral leg pain and swelling  I have reviewed her chart and materials related to her cancer extensively and collaborated history with the patient. Summary of oncologic history is as follows: Oncology History  Ovarian cancer ([place])  09/20/2019 Initial Diagnosis   Presented to her primary care doctor with abdominal bloating.  Abdominal x-ray imaging was unremarkable.   09/29/2019 Imaging   CT of  the chest showed pulmonary embolism in all lobes.  Large right/central pelvic mass likely ovarian neoplasm with omental/peritoneal metastasis with significant free fluid in the abdomen and pelvis.  A small left uterine calcification could be seen in a degenerating fibroid.  Small right and left lung base nodules, could represent sequela of previous infection or inflammation with metastasis not excluded.   09/29/2019 - 10/15/2019 Hospital Admission   She presented to the emergency department in [name] after complaints of leg pain with bilateral lower extremity edema.  D-dimer was elevated.  Lower extremity ultrasound demonstrated extensive bilateral DVT involving peroneal and soleal calf veins on the left and peroneal vein on the right.  CT abdomen and pelvis showed large pelvic mass with omental metastasis and ascites, worrisome for ovarian cancer.  CT imaging of the chest  showed bilateral pulmonary nodules as well as evidence of pulmonary embolism.  She received anticoagulation therapy.  She was also found to be anemic, worrisome that she might have bleeding in the tumor and received transfusion.  Echocardiogram showed preserved ejection fraction but evidence of moderate right ventricular strain.  Subsequent biopsy showed cancer suspicious for ovarian primary and she received first cycle of neoadjuvant chemotherapy with carboplatin and taxol. She had IVC filter placement and temporary TPN   09/30/2019 Procedure   Ultrasound paracentesis was performed complicated by bleeding and received blood transfusion   09/30/2019 Procedure   She had IVC filter placement by interventional radiologist in [facility].   09/30/2019 Echocardiogram   Outside echocardiogram showed left ventricular ejection fraction around 79%.  Concentric left ventricular modeling.  Grade 1 diastolic pattern with normal left atrial pressure.  Right ventricle size is mildly enlarged.  Right ventricular systolic function is moderately reduced.  Right ventricle was not well visualized.  Aortic sclerosis without evidence of stenosis.  Moderate pleural effusion on the left region   10/02/2019 Procedure   EGD was performed which showed no evidence of acute bleeding.  Biopsy of the stomach showed benign gastric mucosa with reactive gastropathy as may be seen in the healing phase of erosive gastritis.  Helicobacter organism testing is negative..   10/03/2019 Procedure   She underwent CT-guided biopsy of the pelvic mass.   10/03/2019 Tumor Marker   CEA level was 2.9.   10/03/2019 Pathology Results   Biopsy show adenocarcinoma.  The malignant cells stain positive for keratin AE1/AE3, CK7 and MOC-31.  Rare cells show weak reactivity with p63 and GA TA 3.  Cells are negative for CK20, calretinin, CDX2, TTF-1, ER and WT-1.  The overall findings are consistent with malignancy, favoring poorly differentiated adenocarcinoma of  uncertain primary site.  Possible consideration include, but not limited to, gynecological origin, pancreatico biliary and urothelial.   10/03/2019 Tumor Marker   Patient's tumor was tested for the following markers: CA-125 Results of the tumor marker test revealed 713 CA19-9 is 100   10/04/2019 Imaging   Tagged red blood cell scan equivocal for source of bleeding.   10/05/2019 Procedure   She underwent paracentesis.  IR removed 4100 cc of ascites fluid with significant blood noted.   10/05/2019 Imaging   CT abdomen and pelvis angiogram protocol show no obvious source of bleeding.   10/05/2019 Imaging   CT imaging of the abdomen and pelvis showed no evidence of postprocedural hemorrhage or discrete area of GI bleeding.  Large volume ascites is present.  Small pockets of intraperitoneal free air are present, likely related to recent paracentesis.  Abnormal mass in the pelvic region most  likely a neoplasm of uterine or ovarian origin.  Normal liver with benign cysts of the right hepatic dome.  Biliary tree and pancreas and spleen were age-appropriate   10/07/2019 Procedure   Ultrasound paracentesis was performed.   10/07/2019 -  Chemotherapy   The patient had neoadjuvant carboplatin and taxol for chemotherapy treatment.     10/08/2019 Imaging   CT angiogram of the abdominal aorta and iliofemoral test was done showing 14 x 10 x 14 cm pelvic mass with mild to moderate ascites.  Normal appearance of arterial structures without evidence of compression.  No evidence of compression of the iliac vein or IVC.   10/11/2019 Imaging   Ultrasound shows gallbladder full of sludge.  Fatty liver changes.  Ascites present.   10/18/2019 Cancer Staging   Staging form: Ovary, Fallopian Tube, and Primary Peritoneal Carcinoma, AJCC 8th Edition - Clinical stage from 10/18/2019: FIGO Stage IIIC, calculated as Stage Unknown (cT3c, cNX, cM0) - Signed by Kristina Lark, MD on 10/18/2019   10/20/2019 -  Chemotherapy   The  patient had PALONOSETRON HCL INJECTION 0.25 MG/5ML, 0.25 mg, Intravenous,  Once, 0 of 6 cycles CARBOplatin (PARAPLATIN) in sodium chloride 0.9 % 100 mL chemo infusion, , Intravenous,  Once, 0 of 6 cycles FOSAPREPITANT IV INFUSION 150 MG, 150 mg, Intravenous,  Once, 0 of 6 cycles PACLitaxel (TAXOL) 318 mg in sodium chloride 0.9 % 500 mL chemo infusion (> 80mg /m2), 175 mg/m2, Intravenous,  Once, 0 of 6 cycles  for chemotherapy treatment.     Since discharge from the hospital, she continues to use 24 hours oxygen therapy She elevate her legs and her swelling has improved She denies worsening peripheral neuropathy She has some sensation of dysuria Denies recent constipation.  In fact, she had frequent bowel movement now No recent nausea Her abdominal swelling has improved The patient denies any recent signs or symptoms of bleeding such as spontaneous epistaxis, hematuria or hematochezia.  MEDICAL HISTORY:  Past Medical History:  Diagnosis Date  . Allergy   . DVT (deep venous thrombosis) ([place])   . Heart murmur   . Numbness and tingling   . Osteopenia 08/2016   T score -1.6 FRAX 7.8%/0.7%  . Peripheral neuropathy, idiopathic   . Pulmonary embolism ([place])     SURGICAL HISTORY: Past Surgical History:  Procedure Laterality Date  . fibroid in 2011,2003    . miscarriage 1989    . ovarian cyst 1992      SOCIAL HISTORY: Social History   Socioeconomic History  . Marital status: Married    Spouse name: [name]  . Number of children: 3  . Years of education: College  . Highest education level: Not on file  Occupational History  . Occupation: PRESCHOOL Product manager: [employer]  Tobacco Use  . Smoking status: Never Smoker  . Smokeless tobacco: Never Used  Vaping Use  . Vaping Use: Never used  Substance and Sexual Activity  . Alcohol use: Yes  . Drug use: No  . Sexual activity: Yes    Partners: Male    Birth control/protection: Post-menopausal  Other Topics Concern   . Not on file  Social History Narrative   Patient is married [name]) and lives at home with her husband. Married x 36 years.   Patient has three adult children; 2 in [place] (30, 61); one in [place] (25).  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   Patient  has a Financial risk analyst.   Patient is right-handed.   Patient drinks one cup of coffee daily.   Pillates three times per week for sixteen years.     Social Determinants of Health   Financial Resource Strain:   . Difficulty of Paying Living Expenses: Not on file  Food Insecurity:   . Worried About Charity fundraiser in the Last Year: Not on file  . Ran Out of Food in the Last Year: Not on file  Transportation Needs:   . Lack of Transportation (Medical): Not on file  . Lack of Transportation (Non-Medical): Not on file  Physical Activity:   . Days of Exercise per Week: Not on file  . Minutes of Exercise per Session: Not on file  Stress:   . Feeling of Stress : Not on file  Social Connections:   . Frequency of Communication with Friends and Family: Not on file  . Frequency of Social Gatherings with Friends and Family: Not on file  . Attends Religious Services: Not on file  . Active Member of Clubs or Organizations: Not on file  . Attends Archivist Meetings: Not on file  . Marital Status: Not on file  Intimate Partner Violence:   . Fear of Current or Ex-Partner: Not on file  . Emotionally Abused: Not on file  . Physically Abused: Not on file  . Sexually Abused: Not on file    FAMILY HISTORY: Family History  Problem Relation Age of Onset  . Cancer Mother 45       leukemia  . Colon cancer Father 51  . Cancer Father        prostate cancer, colon cancer  . Arthritis Father   . Colon cancer Paternal Uncle   . Esophageal cancer Neg Hx   . Rectal cancer Neg Hx   . Stomach cancer Neg Hx     ALLERGIES:  has No Known Allergies.  MEDICATIONS:   Current Outpatient Medications  Medication Sig Dispense Refill  . Cholecalciferol 1000 UNITS capsule Take 2,000 Units by mouth daily.    . ondansetron (ZOFRAN) 8 MG tablet Take by mouth every 8 (eight) hours as needed for nausea or vomiting.    . pantoprazole (PROTONIX) 40 MG tablet Take 40 mg by mouth daily.    . cetirizine (ZYRTEC) 10 MG chewable tablet Chew 10 mg by mouth daily. One tablet daily    . dexamethasone (DECADRON) 4 MG tablet Take 2 tabs at the night before and 2 tab the morning of chemotherapy, every 3 weeks, by mouth x 6 cycles 36 tablet 6  . ELIQUIS 5 MG TABS tablet Take 5 mg by mouth 2 (two) times daily.    Marland Kitchen gabapentin (NEURONTIN) 300 MG capsule Take 300 mg by mouth 3 (three) times daily.     Marland Kitchen HYDROmorphone (DILAUDID) 4 MG tablet Take 1 tablet (4 mg total) by mouth every 4 (four) hours as needed for severe pain. 30 tablet 0  . prochlorperazine (COMPAZINE) 10 MG tablet Take 1 tablet (10 mg total) by mouth every 6 (six) hours as needed (Nausea or vomiting). 30 tablet 1  . vitamin B-12 (CYANOCOBALAMIN) 1000 MCG tablet Take 1,000 mcg by mouth daily.     No current facility-administered medications for this visit.    REVIEW OF SYSTEMS:   Constitutional: Denies fevers, chills or abnormal night sweats Eyes: Denies blurriness of vision, double vision or watery eyes Ears, nose, mouth, throat, and face: Denies mucositis or sore throat Cardiovascular: Denies  palpitation, chest discomfort  Skin: Denies abnormal skin rashes Lymphatics: Denies new lymphadenopathy or easy bruising Behavioral/Psych: Mood is stable, no new changes  All other systems were reviewed with the patient and are negative.  PHYSICAL EXAMINATION: ECOG PERFORMANCE STATUS: 2 - Symptomatic, <50% confined to bed  Vitals:   10/20/19 1135  BP: 124/75  Pulse: 89  Resp: 18  Temp: 99.2 F (37.3 C)  SpO2: 99%   There were no vitals filed for this visit.  GENERAL:alert, no distress and comfortable.  She is  examined on the wheelchair with oxygen delivered via nasal cannula SKIN: skin color, texture, turgor are normal, no rashes or significant lesions EYES: normal, conjunctiva are pink and non-injected, sclera clear OROPHARYNX:no exudate, no erythema and lips, buccal mucosa, and tongue normal  NECK: supple, thyroid normal size, non-tender, without nodularity LYMPH:  no palpable lymphadenopathy in the cervical, axillary or inguinal LUNGS: Reduced breath sounds throughout HEART: regular rate & rhythm and no murmurs with mild bilateral lower extremity edema, right greater than left ABDOMEN:abdomen soft, distended with ascites Musculoskeletal: Noted peripheral cyanosis PSYCH: alert & oriented x 3 with fluent speech NEURO: no focal motor/sensory deficits  LABORATORY DATA:  I have reviewed the data as listed Lab Results  Component Value Date   WBC 16.9 (H) 10/20/2019   HGB 9.5 (L) 10/20/2019   HCT 29.7 (L) 10/20/2019   MCV 90.3 10/20/2019   PLT 181 10/20/2019   Recent Labs    02/28/19 1529 05/28/19 1156 10/20/19 1300  NA 142 137 136  K 3.9 3.8 3.7  CL 103 102 98  CO2 28 29 32  GLUCOSE 77 138* 90  BUN 14 12 6*  CREATININE 0.74 0.79 0.70  CALCIUM 9.4 8.7 8.1*  GFRNONAA 88 81 >60  GFRAA 101 94  --   PROT 6.7 6.2 5.8*  ALBUMIN 4.3  --  1.8*  AST 20 17 37  ALT 15 11 36  ALKPHOS 69  --  172*  BILITOT <0.2 0.4 0.5    RADIOGRAPHIC STUDIES: I have personally reviewed the radiological images as listed and agreed with the findings in the report. VAS [place] ABI WITH/WO TBI  Result Date: 10/20/2019 LOWER EXTREMITY DOPPLER STUDY Indications: Toe discoloration. Other Factors: Recent diagnosis of ovarian cancer.                Patient reports DVT/PE, however, no records available, testing                performed in [name].  Performing Technologist: Ronal Fear RVS, RCS  Examination Guidelines: A complete evaluation includes at minimum, Doppler waveform signals and systolic blood pressure  reading at the level of bilateral brachial, anterior tibial, and posterior tibial arteries, when vessel segments are accessible. Bilateral testing is considered an integral part of a complete examination. Photoelectric Plethysmograph (PPG) waveforms and toe systolic pressure readings are included as required and additional duplex testing as needed. Limited examinations for reoccurring indications may be performed as noted.  ABI Findings: +---------+------------------+-----+---------+--------+ Right    Rt Pressure (mmHg)IndexWaveform Comment  +---------+------------------+-----+---------+--------+ Brachial 142                                      +---------+------------------+-----+---------+--------+ PTA      208               1.46 triphasic         +---------+------------------+-----+---------+--------+ DP  188               1.32 triphasic         +---------+------------------+-----+---------+--------+ Great Toe124               0.87                   +---------+------------------+-----+---------+--------+ +---------+------------------+-----+---------+-------+ Left     Lt Pressure (mmHg)IndexWaveform Comment +---------+------------------+-----+---------+-------+ Brachial 142                                     +---------+------------------+-----+---------+-------+ PTA      194               1.37 triphasic        +---------+------------------+-----+---------+-------+ DP       183               1.29 triphasic        +---------+------------------+-----+---------+-------+ Great Toe                       Absent           +---------+------------------+-----+---------+-------+ +-------+-----------+-----------+------------+------------+ ABI/TBIToday's ABIToday's TBIPrevious ABIPrevious TBI +-------+-----------+-----------+------------+------------+ Right  1.46       0.87                                 +-------+-----------+-----------+------------+------------+ Left   1.37       absent                              +-------+-----------+-----------+------------+------------+  TOES Findings: +----------+---------------+--------+-------+ Right ToesPressure (mmHg)WaveformComment +----------+---------------+--------+-------+ 1st Digit                Normal          +----------+---------------+--------+-------+ 2nd Digit                Absent          +----------+---------------+--------+-------+ 3rd Digit                Normal          +----------+---------------+--------+-------+ 4th Digit                Normal          +----------+---------------+--------+-------+ 5th Digit                Abnormal        +----------+---------------+--------+-------+  +---------+---------------+--------+-------+ Left ToesPressure (mmHg)WaveformComment +---------+---------------+--------+-------+ 1st Digit               Absent          +---------+---------------+--------+-------+ 2nd Digit               Absent          +---------+---------------+--------+-------+ 3rd Digit               Absent          +---------+---------------+--------+-------+ 4th Digit               Absent          +---------+---------------+--------+-------+ 5th Digit               Absent          +---------+---------------+--------+-------+    Summary: Right: Resting right ankle-brachial index indicates noncompressible  right lower extremity arteries; however, waveforms are strong triphasic. The right toe-brachial index is normal. Left: Resting left ankle-brachial index indicates noncompressible left lower extremity arteries; however, waveforms are strong triphasic. The toe wavefrom is absent.  *See table(s) above for measurements and observations.     Preliminary

## 2019-10-20 NOTE — Assessment & Plan Note (Signed)
We discussed pain management I warned her about risk of sedation, nausea and constipation We discussed narcotic refill policy

## 2019-10-20 NOTE — Assessment & Plan Note (Signed)
I plan to order urinalysis and urine culture

## 2019-10-20 NOTE — Progress Notes (Signed)
Patient name: Kristina Flores MRN: [id number] DOB: [birth year] Sex: female  REASON FOR CONSULT: Bilateral toe discoloration with pain  HPI: Kristina Flores is a 59 y.o. female, who was hospitalized while on vacation in [name] in September.  At that time she had developed leg swelling and shortness of breath.  She had a complicated hospital stay in [name] which diagnosed her with bilateral pulmonary emboli bilateral DVT a right ovarian mass later proved to be cancer.  She also had an IVC filter placed after she developed GI bleeding on heparin.  She subsequently developed dusky toes in both feet.  She had a CT angiogram which showed no significant large vessel occlusive disease.  She was eventually placed on Eliquis and is currently taking this.  She has no prior history of claudication.  She has no prior history of embolic events.  She has no history of cardiac arrhythmia.  She states that currently she has burning and stinging sensation like a frostbite in both feet.  She does have a history of peripheral neuropathy baseline but she states that her current symptoms are different.  She is currently undergoing chemotherapy with a view towards surgical debulking in the future.  She does not use tobacco products and is usually very active with hiking skiing and cycling.  Past Medical History:  Diagnosis Date  . Allergy   . Heart murmur   . Numbness and tingling   . Osteopenia 08/2016   T score -1.6 FRAX 7.8%/0.7%  . Peripheral neuropathy, idiopathic    Past Surgical History:  Procedure Laterality Date  . fibroid in 2011,2003    . miscarriage 1989    . ovarian cyst 1992      Family History  Problem Relation Age of Onset  . Cancer Mother 73       leukemia  . Colon cancer Father 37  . Cancer Father        prostate cancer, colon cancer  . Arthritis Father   . Colon cancer Paternal Uncle   . Esophageal cancer Neg Hx   . Rectal cancer Neg Hx   . Stomach cancer Neg Hx     SOCIAL  HISTORY: Social History   Socioeconomic History  . Marital status: Married    Spouse name: [name]  . Number of children: 3  . Years of education: College  . Highest education level: Not on file  Occupational History  . Occupation: PRESCHOOL Product manager: [employer]  Tobacco Use  . Smoking status: Never Smoker  . Smokeless tobacco: Never Used  Vaping Use  . Vaping Use: Never used  Substance and Sexual Activity  . Alcohol use: Yes  . Drug use: No  . Sexual activity: Yes    Partners: Male    Birth control/protection: Post-menopausal  Other Topics Concern  . Not on file  Social History Narrative   Patient is married [name]) and lives at home with her husband. Married x 36 years.   Patient has three adult children; 2 in [place] (30, 88); one in [place] (25).  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   Patient has a college education.   Patient is right-handed.   Patient drinks one cup of coffee daily.   Pillates three times per week for sixteen years.     Social Determinants of Health   Financial Resource Strain:   . Difficulty of Paying Living Expenses: Not on  file  Food Insecurity:   . Worried About Charity fundraiser in the Last Year: Not on file  . Ran Out of Food in the Last Year: Not on file  Transportation Needs:   . Lack of Transportation (Medical): Not on file  . Lack of Transportation (Non-Medical): Not on file  Physical Activity:   . Days of Exercise per Week: Not on file  . Minutes of Exercise per Session: Not on file  Stress:   . Feeling of Stress : Not on file  Social Connections:   . Frequency of Communication with Friends and Family: Not on file  . Frequency of Social Gatherings with Friends and Family: Not on file  . Attends Religious Services: Not on file  . Active Member of Clubs or Organizations: Not on file  . Attends Archivist Meetings: Not on file  .  Marital Status: Not on file  Intimate Partner Violence:   . Fear of Current or Ex-Partner: Not on file  . Emotionally Abused: Not on file  . Physically Abused: Not on file  . Sexually Abused: Not on file    No Known Allergies  Current Outpatient Medications  Medication Sig Dispense Refill  . cetirizine (ZYRTEC) 10 MG chewable tablet Chew 10 mg by mouth daily. One tablet daily    . Cholecalciferol 1000 UNITS capsule Take 2,000 Units by mouth daily.    Marland Kitchen ELIQUIS 5 MG TABS tablet Take 5 mg by mouth 2 (two) times daily.    Marland Kitchen gabapentin (NEURONTIN) 300 MG capsule Take 300 mg by mouth 3 (three) times daily.     Marland Kitchen HYDROmorphone (DILAUDID) 4 MG tablet Take 1 tablet (4 mg total) by mouth every 4 (four) hours as needed for severe pain. 30 tablet 0  . ondansetron (ZOFRAN) 8 MG tablet Take by mouth every 8 (eight) hours as needed for nausea or vomiting.    . pantoprazole (PROTONIX) 40 MG tablet Take 40 mg by mouth daily.    . vitamin B-12 (CYANOCOBALAMIN) 1000 MCG tablet Take 1,000 mcg by mouth daily.     No current facility-administered medications for this visit.    ROS:   General:  No weight loss, Fever, chills  HEENT: No recent headaches, no nasal bleeding, no visual changes, no sore throat  Neurologic: No dizziness, blackouts, seizures. No recent symptoms of stroke or mini- stroke. No recent episodes of slurred speech, or temporary blindness.  Cardiac: No recent episodes of chest pain/pressure, no shortness of breath at rest.  + shortness of breath with exertion.  Denies history of atrial fibrillation or irregular heartbeat  Vascular: No history of rest pain in feet.  No history of claudication.  No history of non-healing ulcer, + history of DVT   Pulmonary: + home oxygen, no productive cough, no hemoptysis,  No asthma or wheezing  Musculoskeletal:  [ ]  Arthritis, [ ]  Low back pain,  [ ]  Joint pain  Hematologic:No history of hypercoagulable state.  No history of easy bleeding.  No  history of anemia  Gastrointestinal: No hematochezia or melena,  No gastroesophageal reflux, no trouble swallowing  Urinary: [ ]  chronic Kidney disease, [ ]  on HD - [ ]  MWF or [ ]  TTHS, [ ]  Burning with urination, [ ]  Frequent urination, [ ]  Difficulty urinating;   Skin: No rashes  Psychological: No history of anxiety,  No history of depression   Physical Examination   Vitals:   10/20/19 1436  BP: 124/88  Pulse: 96  Resp: 20  Temp: 98.2 F (36.8 C)  SpO2: 96%  Weight: 163 lb (73.9 kg)  Height: 5\' 4"  (1.626 m)    General:  Alert and oriented, no acute distress HEENT: Normal Neck: No JVD Cardiac: Regular Rate and Rhythm Skin: No rash, toes 1 and 2 bilateral feet slightly dusky and mottled appearance at the tip no frank gangrenous changes or ulceration Extremity Pulses: Absent right 1+ left dorsalis pedis, 2+ posterior tibial pulses bilaterally Musculoskeletal: No deformity trace edema right foot compared to left  Neurologic: Upper and lower extremity motor 5/5 and symmetric  DATA:  I reviewed about 250 pages of medical records from the patient's recent hospital admission in September in [name] at [facility] hospital  Patient had an IVC filter placed for ongoing GI bleeding in the face of bilateral pulmonary embolus  Patient had an echocardiogram performed which showed no evidence of thrombus and essentially normal ejection fraction there was also a pleural effusion and small pericardial effusion a sending aorta was 3 cm  She was also evaluated by vascular surgery for bilateral cyanotic toes and thought to have had an embolic event.  She had a CT angiogram of the abdomen and pelvis for evaluation of this which showed no significant arterial pathology.  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  She also had elevated CA 19 and Ca1 22.  She was transfused on multiple occasions and also received chemotherapy.  Patient had bilateral ABIs and toe pressures performed at our office today.  Right side ABI was greater than 1 triphasic and normal left as well.  Toe pressure on the left side was 0.  Right toe pressure was greater than 100.  ASSESSMENT: Patient has dusky toes bilaterally.  No evidence of large vessel occlusive disease.  I suspect that most likely this event was a central process such as either HIT or DIC or a central cardiovascular event during her ongoing coagulopathy.   PLAN: Patient was counseled today and protecting her feet and hopefully the toes will continue to heal.  She has essentially normal large vessel perfusion and this is probably a microvascular event.  I discussed with her the possibility of amputation of the toes if they progress to gangrenous changes currently they are not at this point.  Also if she develops unrelenting pain in the toes that might also be a consideration for amputation of the toe.  Hopefully these will continue to heal with time.  I would continue the Eliquis lifetime.  As far as the swelling in her legs is concerned this is probably most likely still related to the DVT.  Once her toes are healed she probably should be on long-term compression stockings to reduce risk of postphlebitic syndrome.  She will follow up with me on as-needed basis if she has any questions about the toes healing or whether or not she is eligible for wearing compression stockings after the toes heal.   Ruta Hinds, MD [facility]: [phone]

## 2019-10-20 NOTE — Assessment & Plan Note (Signed)
She is at risk of neuropathy She will continue gabapentin I recommend she consider ice treatment during chemotherapy to reduce risk of further neuropathy

## 2019-10-20 NOTE — Assessment & Plan Note (Signed)
She tolerated recent chemotherapy well She is experiencing alopecia which is expected side effects of treatment I gave her prescription for cranial prosthesis She has reasonable venous access and would not need a port I plan to see her again next week for further assessment of toxicity She will get chemo education class next week along with Covid vaccine booster injection I will schedule cycle 2 of treatment to be given on October 18 We discussed neoadjuvant chemotherapy approach with minimum 3 cycles of carboplatin and paclitaxel before repeating imaging study, with further plan for chemotherapy or interval debulking surgery in the future  We reviewed the NCCN guidelines We discussed the role of chemotherapy. The intent is of curative intent.  We discussed some of the risks, benefits, side-effects of carboplatin & Taxol. Treatment is intravenous, every 3 weeks x 6 cycles  Some of the short term side-effects included, though not limited to, including weight loss, life threatening infections, risk of allergic reactions, need for transfusions of blood products, nausea, vomiting, change in bowel habits, loss of hair, admission to hospital for various reasons, and risks of death.   Long term side-effects are also discussed including risks of infertility, permanent damage to nerve function, hearing loss, chronic fatigue, kidney damage with possibility needing hemodialysis, and rare secondary malignancy including bone marrow disorders.  The patient is aware that the response rates discussed earlier is not guaranteed.  After a long discussion, patient made an informed decision to proceed with the prescribed plan of care.   Patient education material was dispensed. We discussed premedication with dexamethasone before chemotherapy. I will schedule tumor marker monitoring monthly while she is on treatment

## 2019-10-20 NOTE — Assessment & Plan Note (Signed)
Her outside CT imaging is being loaded into the computer I will review her imaging study and discussed the case at the GYN oncology tumor board next week Currently, she is not symptomatic I recommend we start weaning her off oxygen during daytime if possible

## 2019-10-20 NOTE — Progress Notes (Signed)
START ON PATHWAY REGIMEN - Ovarian     A cycle is every 21 days:     Paclitaxel      Carboplatin   **Always confirm dose/schedule in your pharmacy ordering system**  Patient Characteristics: Preoperative or Nonsurgical Candidate (Clinical Staging), Newly Diagnosed, Neoadjuvant Therapy followed by Surgery BRCA Mutation Status: Awaiting Test Results Therapeutic Status: Preoperative or Nonsurgical Candidate (Clinical Staging) AJCC T Category: cT3 AJCC 8 Stage Grouping: Unknown AJCC N Category: cNX AJCC M Category: cM0 Therapy Plan: Neoadjuvant Therapy followed by Surgery Intent of Therapy: Curative Intent, Discussed with Patient

## 2019-10-20 NOTE — Assessment & Plan Note (Signed)
She has appointment to see vascular surgery We will consider IVC filter removal in the future

## 2019-10-21 ENCOUNTER — Other Ambulatory Visit: Payer: Self-pay | Admitting: *Deleted

## 2019-10-21 ENCOUNTER — Telehealth: Payer: Self-pay

## 2019-10-21 ENCOUNTER — Encounter: Payer: Self-pay | Admitting: *Deleted

## 2019-10-21 NOTE — Patient Outreach (Addendum)
[facility]) Care Management  10/21/2019  Kristina Flores [facility] Oct 14, 1960 [id number]  Transition of care call   Referral received: 10/05/19 Initial outreach:10/18/19 Insurance: [place] [employer]    Subjective: 2nd attempt successful telephone call to patient's preferred number in order to complete transition of care assessment; 2 HIPAA identifiers verified. Explained purpose of call and completed transition of care assessment.  Kristina Flores states that she has no energy, she reviewed her recent hospital admission during her vacation to [place]. She expressed being thankful to be alive and back home. She reports having some pain in her toes that is managed with prn tylenol. She reports that she is able to rest well at night.She discusses having swelling in lower legs and toes are blue colored she described it as feeling as if toes in ice water, she reports feeling some better after follow up visit with vascular MD and hopeful for improvement. She continues to wear oxygen at 2 liters ,denies increase in shortness of breath, they are purchasing a new pulse oximeter to monitor level at home. She discussed the recent follow up with PCP, Oncology and plans for chemotherapy . Kristina Flores discussed PCP office is working on setting up home health services unsure with agency.  She discussed appetite slowly improving and focusing on getting in protein.. She denies bowel or bladder problems, no signs of bleeding noted.  Spouse/children are assisting with her  recovery.  .  She is unsure if spouse has the  the hospital indemnity plan , provided contact number to [employer]- [phone] to file claim if needed.  She uses a [facility] outpatient pharmacy at [facility].  Objective:  [name] was Hospitalized at [facility] in [place] , 9/16-10/2/21, for Bilateral DVT, Pulmonary embolus, IVC filter placed, Ovarian mass cancer diagnosis started on chemotherapy.  Assessment:  Patient voices good  understanding of all discharge instructions.  See transition of care flowsheet for assessment details.   Plan:  Reviewed hospital discharge diagnosis of Pulmonary embolus, DVT, Ovarian cancer   and discharge treatment plan using hospital discharge instructions, assessing medication adherence, reviewing problems requiring provider notification, and discussing the importance of follow up with  primary care provider and/or specialists as directed.  Reviewed  healthy lifestyle program information to receive discounted premium for  2022   Step 1: Get  your annual physical  Step 2: Complete your health assessment  Step 3:Identify your current health status and complete the corresponding action step between January 1, and September 14, 2019.  Completed.    Plan Will plan follow up call in the next week regarding home health services and assess for additional care coordination needs.    Joylene Draft, RN, BSN  [facility] Management Coordinator  [phone]- Mobile [phone]- Toll Free Main Office

## 2019-10-21 NOTE — Telephone Encounter (Signed)
-----   Message from Heath Lark, MD sent at 10/21/2019  8:44 AM EDT ----- Regarding: UA so far look normal, no signs of UTI, pls let her know

## 2019-10-21 NOTE — Telephone Encounter (Signed)
Called and left below message. Ask her to call the office back for questions or concerns. °

## 2019-10-22 LAB — URINE CULTURE

## 2019-10-22 LAB — CA 125: Cancer Antigen (CA) 125: 257 U/mL — ABNORMAL HIGH (ref 0.0–38.1)

## 2019-10-24 ENCOUNTER — Ambulatory Visit: Payer: 59 | Admitting: Hematology and Oncology

## 2019-10-24 ENCOUNTER — Other Ambulatory Visit: Payer: 59

## 2019-10-24 ENCOUNTER — Other Ambulatory Visit: Payer: Self-pay | Admitting: Oncology

## 2019-10-24 ENCOUNTER — Telehealth: Payer: Self-pay

## 2019-10-24 NOTE — Telephone Encounter (Signed)
Called and left below message that she may try melatonin for sleep. Ask her to call for questions.

## 2019-10-24 NOTE — Telephone Encounter (Signed)
She called and left a message. Thank you for the recent visit.  She is asking if it is okay to take Melatonin for sleep at night? She is having a hard time sleeping.

## 2019-10-24 NOTE — Telephone Encounter (Signed)
Yes that is ok

## 2019-10-24 NOTE — Progress Notes (Signed)
Gynecologic Oncology Multi-Disciplinary Disposition Conference Note  Date of the Conference: 10/24/2019  Patient Name: Kristina Flores [facility]  Primary GYN Oncologist: Dr. Denman George  Stage/Disposition:  Stage IIIc ovarian cancer. Disposition is to 4-5 cycles of chemotherapy followed by interval debulking surgery and referral for genetic counseling.   This Multidisciplinary conference took place involving physicians from [facility], [facility], Radiation Oncology, Pathology, Radiology along with the Gynecologic Oncology Nurse Practitioner and RN.  Comprehensive assessment of the patient's malignancy, staging, need for surgery, chemotherapy, radiation therapy, and need for further testing were reviewed. Supportive measures, both inpatient and following discharge were also discussed. The recommended plan of care is documented. Greater than 35 minutes were spent correlating and coordinating this patient's care.

## 2019-10-25 NOTE — Progress Notes (Signed)
Pharmacist Chemotherapy Monitoring - Initial Assessment    Anticipated start date: 10/31/2019   Regimen:  . Are orders appropriate based on the patient's diagnosis, regimen, and cycle? Yes . Does the plan date match the patient's scheduled date? Yes . Is the sequencing of drugs appropriate? Yes . Are the premedications appropriate for the patient's regimen? Yes . Prior Authorization for treatment is: Not Started o If applicable, is the correct biosimilar selected based on the patient's insurance? not applicable  Organ Function and Labs: Marland Kitchen Are dose adjustments needed based on the patient's renal function, hepatic function, or hematologic function? Yes . Are appropriate labs ordered prior to the start of patient's treatment? Yes . Other organ system assessment, if indicated: N/A . The following baseline labs, if indicated, have been ordered: N/A  Dose Assessment: . Are the drug doses appropriate? Yes . Are the following correct: o Drug concentrations Yes o IV fluid compatible with drug Yes o Administration routes Yes o Timing of therapy Yes . If applicable, does the patient have documented access for treatment and/or plans for port-a-cath placement? not applicable . If applicable, have lifetime cumulative doses been properly documented and assessed? yes Lifetime Dose Tracking  No doses have been documented on this patient for the following tracked chemicals: Doxorubicin, Epirubicin, Idarubicin, Daunorubicin, Mitoxantrone, Bleomycin, Oxaliplatin, Carboplatin, Liposomal Doxorubicin  o   Toxicity Monitoring/Prevention: . The patient has the following take home antiemetics prescribed: Ondansetron and Prochlorperazine . The patient has the following take home medications prescribed: N/A . Medication allergies and previous infusion related reactions, if applicable, have been reviewed and addressed. No . The patient's current medication list has been assessed for drug-drug interactions with  their chemotherapy regimen. no significant drug-drug interactions were identified on review.  Order Review: . Are the treatment plan orders signed? Yes . Is the patient scheduled to see a provider prior to their treatment? No  I verify that I have reviewed each item in the above checklist and answered each question accordingly.  Kristina Flores D 10/25/2019 12:03 PM

## 2019-10-26 ENCOUNTER — Telehealth: Payer: Self-pay | Admitting: Family Medicine

## 2019-10-26 ENCOUNTER — Other Ambulatory Visit: Payer: Self-pay | Admitting: *Deleted

## 2019-10-26 NOTE — Patient Outreach (Signed)
[facility]) Care Management  10/26/2019  Kristina Flores [facility] Nov 19, 1960 [id number]   Transition of Care follow up call   Referral received: 10/05/19 Initial outreach:10/18/19 Insurance: [place] [employer]    Subjective: Successful outreach call to patient reports that she is doing.  Call placed to follow up on whether patient has received call regarding home health services ordered by PCP at post discharge visit on 10/18/19.   Patient states that she has not received a call yet regarding home health agency to follow her.  She is agreeable that I follow up with office regarding referral to home health.   Objective: [name] was Hospitalized at [facility] in [place] , 9/16-10/2/21, for Bilateral DVT, Pulmonary embolus, IVC filter placed, Ovarian mass cancer diagnosis started on chemotherapy.   Plan Placed call to [facility] office able to speak with representative Sarah to discuss patient requesting follow up on home health services referral by PCP, she states that she will send PCP a message .  Will plan follow up call to patient in the next 4 business days .   Joylene Draft, RN, BSN  [facility] Management Coordinator  [phone]- Mobile [phone]- Toll Free Main Office

## 2019-10-26 NOTE — Telephone Encounter (Signed)
Kristina Flores from [facility] called  [phone] stated that pt has not been called about her home health. Pt discussed this when she was last here on 10/18/19 via Mychart. Pt hasn't gotten a call on who the agency will be or when they are going to start. Pt would like a call about this. Please advise.

## 2019-10-27 ENCOUNTER — Other Ambulatory Visit: Payer: Self-pay

## 2019-10-27 ENCOUNTER — Encounter: Payer: Self-pay | Admitting: Hematology and Oncology

## 2019-10-27 ENCOUNTER — Inpatient Hospital Stay (HOSPITAL_BASED_OUTPATIENT_CLINIC_OR_DEPARTMENT_OTHER): Payer: 59 | Admitting: Hematology and Oncology

## 2019-10-27 ENCOUNTER — Inpatient Hospital Stay: Payer: 59

## 2019-10-27 ENCOUNTER — Other Ambulatory Visit: Payer: Self-pay | Admitting: Hematology and Oncology

## 2019-10-27 VITALS — BP 147/97 | HR 76 | Temp 97.7°F | Resp 18 | Ht 64.0 in | Wt 146.8 lb

## 2019-10-27 DIAGNOSIS — R918 Other nonspecific abnormal finding of lung field: Secondary | ICD-10-CM

## 2019-10-27 DIAGNOSIS — R5381 Other malaise: Secondary | ICD-10-CM

## 2019-10-27 DIAGNOSIS — Z23 Encounter for immunization: Secondary | ICD-10-CM | POA: Diagnosis not present

## 2019-10-27 DIAGNOSIS — C569 Malignant neoplasm of unspecified ovary: Secondary | ICD-10-CM | POA: Diagnosis not present

## 2019-10-27 DIAGNOSIS — G893 Neoplasm related pain (acute) (chronic): Secondary | ICD-10-CM

## 2019-10-27 DIAGNOSIS — R180 Malignant ascites: Secondary | ICD-10-CM | POA: Diagnosis not present

## 2019-10-27 DIAGNOSIS — G609 Hereditary and idiopathic neuropathy, unspecified: Secondary | ICD-10-CM | POA: Diagnosis not present

## 2019-10-27 DIAGNOSIS — I82403 Acute embolism and thrombosis of unspecified deep veins of lower extremity, bilateral: Secondary | ICD-10-CM | POA: Diagnosis not present

## 2019-10-27 DIAGNOSIS — I824Z3 Acute embolism and thrombosis of unspecified deep veins of distal lower extremity, bilateral: Secondary | ICD-10-CM

## 2019-10-27 DIAGNOSIS — Z5111 Encounter for antineoplastic chemotherapy: Secondary | ICD-10-CM | POA: Diagnosis not present

## 2019-10-27 DIAGNOSIS — D61818 Other pancytopenia: Secondary | ICD-10-CM

## 2019-10-27 DIAGNOSIS — I2699 Other pulmonary embolism without acute cor pulmonale: Secondary | ICD-10-CM | POA: Diagnosis not present

## 2019-10-27 DIAGNOSIS — C786 Secondary malignant neoplasm of retroperitoneum and peritoneum: Secondary | ICD-10-CM | POA: Diagnosis not present

## 2019-10-27 LAB — CBC WITH DIFFERENTIAL/PLATELET
Abs Immature Granulocytes: 0.11 10*3/uL — ABNORMAL HIGH (ref 0.00–0.07)
Basophils Absolute: 0.1 10*3/uL (ref 0.0–0.1)
Basophils Relative: 1 %
Eosinophils Absolute: 0 10*3/uL (ref 0.0–0.5)
Eosinophils Relative: 1 %
HCT: 28.2 % — ABNORMAL LOW (ref 36.0–46.0)
Hemoglobin: 9 g/dL — ABNORMAL LOW (ref 12.0–15.0)
Immature Granulocytes: 1 %
Lymphocytes Relative: 20 %
Lymphs Abs: 1.8 10*3/uL (ref 0.7–4.0)
MCH: 28.8 pg (ref 26.0–34.0)
MCHC: 31.9 g/dL (ref 30.0–36.0)
MCV: 90.4 fL (ref 80.0–100.0)
Monocytes Absolute: 0.7 10*3/uL (ref 0.1–1.0)
Monocytes Relative: 8 %
Neutro Abs: 6 10*3/uL (ref 1.7–7.7)
Neutrophils Relative %: 69 %
Platelets: 566 10*3/uL — ABNORMAL HIGH (ref 150–400)
RBC: 3.12 MIL/uL — ABNORMAL LOW (ref 3.87–5.11)
RDW: 15.5 % (ref 11.5–15.5)
WBC: 8.7 10*3/uL (ref 4.0–10.5)
nRBC: 0 % (ref 0.0–0.2)

## 2019-10-27 LAB — COMPREHENSIVE METABOLIC PANEL
ALT: 20 U/L (ref 0–44)
AST: 28 U/L (ref 15–41)
Albumin: 2.3 g/dL — ABNORMAL LOW (ref 3.5–5.0)
Alkaline Phosphatase: 110 U/L (ref 38–126)
Anion gap: 9 (ref 5–15)
BUN: 15 mg/dL (ref 8–23)
CO2: 28 mmol/L (ref 22–32)
Calcium: 8.8 mg/dL — ABNORMAL LOW (ref 8.9–10.3)
Chloride: 103 mmol/L (ref 98–111)
Creatinine, Ser: 0.7 mg/dL (ref 0.44–1.00)
GFR, Estimated: >60 mL/min (ref >60)
Glucose, Bld: 103 mg/dL — ABNORMAL HIGH (ref 70–99)
Potassium: 3.9 mmol/L (ref 3.5–5.1)
Sodium: 140 mmol/L (ref 135–145)
Total Bilirubin: 0.2 mg/dL — ABNORMAL LOW (ref 0.3–1.2)
Total Protein: 7.2 g/dL (ref 6.5–8.1)

## 2019-10-27 LAB — SAMPLE TO BLOOD BANK

## 2019-10-27 MED ORDER — ELIQUIS 5 MG PO TABS: 5.0000 mg | ORAL_TABLET | Freq: Two times a day (BID) | ORAL | 11 refills | Status: DC

## 2019-10-27 NOTE — Assessment & Plan Note (Signed)
The neuropathy is not worse She will continue current prescribed dose gabapentin

## 2019-10-27 NOTE — Progress Notes (Signed)
[place] OFFICE PROGRESS NOTE  Patient Care Team: Rutherford Guys, MD as PCP - General (Family Medicine) Alfonzo Feller, RN as [place] [facility] Hess, Craige Cotta, RN as Oncology Nurse Navigator (Oncology)  ASSESSMENT & PLAN:  Ovarian cancer [facility]) She tolerated recent chemotherapy very well Her recent weight loss is likely due to resolution of ascites I will adjust the chemotherapy based on the most current weight and her blood work I recommend minimum 3-4 cycles of treatment before repeating CT imaging for objective assessment of response to therapy Clinically, she appears to be responding well Her recent tumor marker is coming down Her case was recently discussed at the GYN oncology tumor board She has appointment to see GYN surgeon tomorrow for further follow-up We discussed the role of neoadjuvant chemotherapy She is aware of the curative intent  Acute deep vein thrombosis of lower leg, bilateral ([place]) She has bilateral lower extremity edema and cyanotic changes consistent with signs of peripheral vascular disease She has appointment to see vascular surgery for further assessment She will continue anticoagulation therapy  Acute pulmonary embolism ([place]) Shortness of breath has improved Her oxygen saturation is good I recommend her to discontinue daytime oxygen and to only use nighttime oxygen for now  Cancer associated pain She has minimum pain She will take pain medicine as needed  Idiopathic neuropathy The neuropathy is not worse She will continue current prescribed dose gabapentin  Malignant ascites This is resolved on exam She does not need paracentesis  Multiple lung nodules on CT She is not symptomatic We will order CT scan after 3-4 cycles of treatment to assess  Physical deconditioning She is very deconditioned I recommend referral to physical therapy and rehab and she is in agreement  Pancytopenia, acquired  ([place]) Her recent vitamin B12 level and iron studies are adequate This is due to anemia chronic disease and related to recent chemotherapy She does not need transfusion support today   Orders Placed This Encounter  Procedures  . Ambulatory referral to Physical Therapy    Referral Priority:   Routine    Referral Type:   Physical Medicine    Referral Reason:   Specialty Services Required    Requested Specialty:   Physical Therapy    Number of Visits Requested:   1    All questions were answered. The patient knows to call the clinic with any problems, questions or concerns. The total time spent in the appointment was 40 minutes encounter with patients including review of chart and various tests results, discussions about plan of care and coordination of care plan   Heath Lark, MD 10/27/2019 12:55 PM  INTERVAL HISTORY: Please see below for problem oriented charting. She returns with her husband for further follow-up She complains of overall weakness She has lost a lot of weight but felt that it is due to loss of fluid retention Her abdominal bloating has improved Her leg swelling has improved The patient denies any recent signs or symptoms of bleeding such as spontaneous epistaxis, hematuria or hematochezia. She denies recent nausea or constipation No worsening peripheral neuropathy   SUMMARY OF ONCOLOGIC HISTORY: Oncology History  Ovarian cancer ([place])  09/20/2019 Initial Diagnosis   Presented to her primary care doctor with abdominal bloating.  Abdominal x-ray imaging was unremarkable.   09/29/2019 Imaging   CT of the chest showed pulmonary embolism in all lobes.  Large right/central pelvic mass likely ovarian neoplasm with omental/peritoneal metastasis with significant free fluid  in the abdomen and pelvis.  A small left uterine calcification could be seen in a degenerating fibroid.  Small right and left lung base nodules, could represent sequela of previous infection or  inflammation with metastasis not excluded.   09/29/2019 - 10/15/2019 Hospital Admission   She presented to the emergency department in [name] after complaints of leg pain with bilateral lower extremity edema.  D-dimer was elevated.  Lower extremity ultrasound demonstrated extensive bilateral DVT involving peroneal and soleal calf veins on the left and peroneal vein on the right.  CT abdomen and pelvis showed large pelvic mass with omental metastasis and ascites, worrisome for ovarian cancer.  CT imaging of the chest showed bilateral pulmonary nodules as well as evidence of pulmonary embolism.  She received anticoagulation therapy.  She was also found to be anemic, worrisome that she might have bleeding in the tumor and received transfusion.  Echocardiogram showed preserved ejection fraction but evidence of moderate right ventricular strain.  Subsequent biopsy showed cancer suspicious for ovarian primary and she received first cycle of neoadjuvant chemotherapy with carboplatin and taxol. She had IVC filter placement and temporary TPN   09/30/2019 Procedure   Ultrasound paracentesis was performed complicated by bleeding and received blood transfusion   09/30/2019 Procedure   She had IVC filter placement by interventional radiologist in [facility].   09/30/2019 Echocardiogram   Outside echocardiogram showed left ventricular ejection fraction around 79%.  Concentric left ventricular modeling.  Grade 1 diastolic pattern with normal left atrial pressure.  Right ventricle size is mildly enlarged.  Right ventricular systolic function is moderately reduced.  Right ventricle was not well visualized.  Aortic sclerosis without evidence of stenosis.  Moderate pleural effusion on the left region   10/02/2019 Procedure   EGD was performed which showed no evidence of acute bleeding.  Biopsy of the stomach showed benign gastric mucosa with reactive gastropathy as may be seen in the healing phase of erosive gastritis.  Helicobacter  organism testing is negative..   10/03/2019 Procedure   She underwent CT-guided biopsy of the pelvic mass.   10/03/2019 Tumor Marker   CEA level was 2.9.   10/03/2019 Pathology Results   Biopsy show adenocarcinoma.  The malignant cells stain positive for keratin AE1/AE3, CK7 and MOC-31.  Rare cells show weak reactivity with p63 and GA TA 3.  Cells are negative for CK20, calretinin, CDX2, TTF-1, ER and WT-1.  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  Possible consideration include, but not limited to, gynecological origin, pancreatico biliary and urothelial.   10/03/2019 Tumor Marker   Patient's tumor was tested for the following markers: CA-125 Results of the tumor marker test revealed 713 CA19-9 is 100   10/04/2019 Imaging   Tagged red blood cell scan equivocal for source of bleeding.   10/05/2019 Procedure   She underwent paracentesis.  IR removed 4100 cc of ascites fluid with significant blood noted.   10/05/2019 Imaging   CT abdomen and pelvis angiogram protocol show no obvious source of bleeding.   10/05/2019 Imaging   CT imaging of the abdomen and pelvis showed no evidence of postprocedural hemorrhage or discrete area of GI bleeding.  Large volume ascites is present.  Small pockets of intraperitoneal free air are present, likely related to recent paracentesis.  Abnormal mass in the pelvic region most likely a neoplasm of uterine or ovarian origin.  Normal liver with benign cysts of the right hepatic dome.  Biliary tree and  pancreas and spleen were age-appropriate   10/07/2019 Procedure   Ultrasound paracentesis was performed.   10/07/2019 -  Chemotherapy   The patient had neoadjuvant carboplatin and taxol for chemotherapy treatment.     10/08/2019 Imaging   CT angiogram of the abdominal aorta and iliofemoral test was done showing 14 x 10 x 14 cm pelvic mass with mild to moderate ascites.  Normal  appearance of arterial structures without evidence of compression.  No evidence of compression of the iliac vein or IVC.   10/11/2019 Imaging   Ultrasound shows gallbladder full of sludge.  Fatty liver changes.  Ascites present.   10/18/2019 Cancer Staging   Staging form: Ovary, Fallopian Tube, and Primary Peritoneal Carcinoma, AJCC 8th Edition - Clinical stage from 10/18/2019: FIGO Stage IIIC, calculated as Stage Unknown (cT3c, cNX, cM0) - Signed by Heath Lark, MD on 10/18/2019   10/21/2019 Tumor Marker   Patient's tumor was tested for the following markers: CA-125. Results of the tumor marker test revealed 257.   10/31/2019 -  Chemotherapy   The patient had palonosetron (ALOXI) injection 0.25 mg, 0.25 mg, Intravenous,  Once, 0 of 6 cycles CARBOplatin (PARAPLATIN) 620 mg in sodium chloride 0.9 % 250 mL chemo infusion, 620 mg (94.5 % of original dose 651.6 mg), Intravenous,  Once, 0 of 6 cycles Dose modification:   (original dose 651.6 mg, Cycle 1) fosaprepitant (EMEND) 150 mg in sodium chloride 0.9 % 145 mL IVPB, 150 mg, Intravenous,  Once, 0 of 6 cycles PACLitaxel (TAXOL) 300 mg in sodium chloride 0.9 % 250 mL chemo infusion (> 80mg /m2), 175 mg/m2 = 300 mg, Intravenous,  Once, 0 of 6 cycles  for chemotherapy treatment.      REVIEW OF SYSTEMS:   Constitutional: Denies fevers, chills s Eyes: Denies blurriness of vision Ears, nose, mouth, throat, and face: Denies mucositis or sore throat Respiratory: Denies cough, dyspnea or wheezes Cardiovascular: Denies palpitation, chest discomfort or lower extremity swelling Gastrointestinal:  Denies nausea, heartburn or change in bowel habits Skin: Denies abnormal skin rashes Lymphatics: Denies new lymphadenopathy or easy bruising Neurological:Denies numbness, tingling or new weaknesses Behavioral/Psych: Mood is stable, no new changes  All other systems were reviewed with the patient and are negative.  I have reviewed the past medical history,  past surgical history, social history and family history with the patient and they are unchanged from previous note.  ALLERGIES:  has No Known Allergies.  MEDICATIONS:  Current Outpatient Medications  Medication Sig Dispense Refill  . gabapentin (NEURONTIN) 300 MG capsule Take 900 mg by mouth 3 (three) times daily.    . cetirizine (ZYRTEC) 10 MG chewable tablet Chew 10 mg by mouth daily. One tablet daily    . Cholecalciferol 1000 UNITS capsule Take 2,000 Units by mouth daily.    Marland Kitchen dexamethasone (DECADRON) 4 MG tablet Take 2 tabs at the night before and 2 tab the morning of chemotherapy, every 3 weeks, by mouth x 6 cycles 36 tablet 6  . ELIQUIS 5 MG TABS tablet Take 1 tablet (5 mg total) by mouth 2 (two) times daily. 60 tablet 11  . HYDROmorphone (DILAUDID) 4 MG tablet Take 1 tablet (4 mg total) by mouth every 4 (four) hours as needed for severe pain. 30 tablet 0  . ondansetron (ZOFRAN) 8 MG tablet Take by mouth every 8 (eight) hours as needed for nausea or vomiting.    . pantoprazole (PROTONIX) 40 MG tablet Take 40 mg by mouth daily.    . prochlorperazine (COMPAZINE)  10 MG tablet Take 1 tablet (10 mg total) by mouth every 6 (six) hours as needed (Nausea or vomiting). 30 tablet 1  . vitamin B-12 (CYANOCOBALAMIN) 1000 MCG tablet Take 1,000 mcg by mouth daily.     No current facility-administered medications for this visit.    PHYSICAL EXAMINATION: ECOG PERFORMANCE STATUS: 2 - Symptomatic, <50% confined to bed  Vitals:   10/27/19 1136  BP: (!) 147/97  Pulse: 76  Resp: 18  Temp: 97.7 F (36.5 C)  SpO2: 99%   Filed Weights   10/27/19 1136  Weight: 146 lb 12.8 oz (66.6 kg)    GENERAL:alert, no distress and comfortable SKIN: skin color, texture, turgor are normal, no rashes or significant lesions EYES: normal, Conjunctiva are pink and non-injected, sclera clear OROPHARYNX:no exudate, no erythema and lips, buccal mucosa, and tongue normal  NECK: supple, thyroid normal size,  non-tender, without nodularity LYMPH:  no palpable lymphadenopathy in the cervical, axillary or inguinal LUNGS: clear to auscultation and percussion with normal breathing effort HEART: regular rate & rhythm and no murmurs and no lower extremity edema ABDOMEN:abdomen soft, non-tender and normal bowel sounds Musculoskeletal:no cyanosis of digits and no clubbing  NEURO: alert & oriented x 3 with fluent speech, no focal motor/sensory deficits  LABORATORY DATA:  I have reviewed the data as listed    Component Value Date/Time   NA 140 10/27/2019 0942   NA 142 02/28/2019 1529   K 3.9 10/27/2019 0942   CL 103 10/27/2019 0942   CO2 28 10/27/2019 0942   GLUCOSE 103 (H) 10/27/2019 0942   BUN 15 10/27/2019 0942   BUN 14 02/28/2019 1529   CREATININE 0.70 10/27/2019 0942   CREATININE 0.79 05/28/2019 1156   CALCIUM 8.8 (L) 10/27/2019 0942   PROT 7.2 10/27/2019 0942   PROT 6.7 02/28/2019 1529   ALBUMIN 2.3 (L) 10/27/2019 0942   ALBUMIN 4.3 02/28/2019 1529   AST 28 10/27/2019 0942   ALT 20 10/27/2019 0942   ALKPHOS 110 10/27/2019 0942   BILITOT 0.2 (L) 10/27/2019 0942   BILITOT <0.2 02/28/2019 1529   GFRNONAA >60 10/27/2019 0942   GFRNONAA 81 05/28/2019 1156   GFRAA 94 05/28/2019 1156    No results found for: SPEP, UPEP  Lab Results  Component Value Date   WBC 8.7 10/27/2019   NEUTROABS 6.0 10/27/2019   HGB 9.0 (L) 10/27/2019   HCT 28.2 (L) 10/27/2019   MCV 90.4 10/27/2019   PLT 566 (H) 10/27/2019      Chemistry      Component Value Date/Time   NA 140 10/27/2019 0942   NA 142 02/28/2019 1529   K 3.9 10/27/2019 0942   CL 103 10/27/2019 0942   CO2 28 10/27/2019 0942   BUN 15 10/27/2019 0942   BUN 14 02/28/2019 1529   CREATININE 0.70 10/27/2019 0942   CREATININE 0.79 05/28/2019 1156      Component Value Date/Time   CALCIUM 8.8 (L) 10/27/2019 0942   ALKPHOS 110 10/27/2019 0942   AST 28 10/27/2019 0942   ALT 20 10/27/2019 0942   BILITOT 0.2 (L) 10/27/2019 0942   BILITOT  <0.2 02/28/2019 1529       RADIOGRAPHIC STUDIES: I have personally reviewed the radiological images as listed and agreed with the findings in the report. VAS [place] ABI WITH/WO TBI  Result Date: 10/20/2019 LOWER EXTREMITY DOPPLER STUDY Indications: Toe discoloration. Other Factors: Recent diagnosis of ovarian cancer.  Patient reports DVT/PE, however, no records available, testing                performed in [name].  Performing Technologist: Ronal Fear RVS, RCS  Examination Guidelines: A complete evaluation includes at minimum, Doppler waveform signals and systolic blood pressure reading at the level of bilateral brachial, anterior tibial, and posterior tibial arteries, when vessel segments are accessible. Bilateral testing is considered an integral part of a complete examination. Photoelectric Plethysmograph (PPG) waveforms and toe systolic pressure readings are included as required and additional duplex testing as needed. Limited examinations for reoccurring indications may be performed as noted.  ABI Findings: +---------+------------------+-----+---------+--------+ Right    Rt Pressure (mmHg)IndexWaveform Comment  +---------+------------------+-----+---------+--------+ Brachial 142                                      +---------+------------------+-----+---------+--------+ PTA      208               1.46 triphasic         +---------+------------------+-----+---------+--------+ DP       188               1.32 triphasic         +---------+------------------+-----+---------+--------+ Trudee Grip               0.87                   +---------+------------------+-----+---------+--------+ +---------+------------------+-----+---------+-------+ Left     Lt Pressure (mmHg)IndexWaveform Comment +---------+------------------+-----+---------+-------+ Brachial 142                                     +---------+------------------+-----+---------+-------+ PTA       194               1.37 triphasic        +---------+------------------+-----+---------+-------+ DP       183               1.29 triphasic        +---------+------------------+-----+---------+-------+ Great Toe                       Absent           +---------+------------------+-----+---------+-------+ +-------+-----------+-----------+------------+------------+ ABI/TBIToday's ABIToday's TBIPrevious ABIPrevious TBI +-------+-----------+-----------+------------+------------+ Right  1.46       0.87                                +-------+-----------+-----------+------------+------------+ Left   1.37       absent                              +-------+-----------+-----------+------------+------------+  TOES Findings: +----------+---------------+--------+-------+ Right ToesPressure (mmHg)WaveformComment +----------+---------------+--------+-------+ 1st Digit                Normal          +----------+---------------+--------+-------+ 2nd Digit                Absent          +----------+---------------+--------+-------+ 3rd Digit                Normal          +----------+---------------+--------+-------+ 4th Digit  Normal          +----------+---------------+--------+-------+ 5th Digit                Abnormal        +----------+---------------+--------+-------+  +---------+---------------+--------+-------+ Left ToesPressure (mmHg)WaveformComment +---------+---------------+--------+-------+ 1st Digit               Absent          +---------+---------------+--------+-------+ 2nd Digit               Absent          +---------+---------------+--------+-------+ 3rd Digit               Absent          +---------+---------------+--------+-------+ 4th Digit               Absent          +---------+---------------+--------+-------+ 5th Digit               Absent          +---------+---------------+--------+-------+     Summary: Right: Resting right ankle-brachial index indicates noncompressible right lower extremity arteries; however, waveforms are strong triphasic. The right toe-brachial index is normal. Left: Resting left ankle-brachial index indicates noncompressible left lower extremity arteries; however, waveforms are strong triphasic. The toe wavefrom is absent.  *See table(s) above for measurements and observations.  Electronically signed by Ruta Hinds MD on 10/20/2019 at 2:59:44 PM.    Final

## 2019-10-27 NOTE — Assessment & Plan Note (Signed)
She is very deconditioned I recommend referral to physical therapy and rehab and she is in agreement

## 2019-10-27 NOTE — Assessment & Plan Note (Signed)
Shortness of breath has improved Her oxygen saturation is good I recommend her to discontinue daytime oxygen and to only use nighttime oxygen for now

## 2019-10-27 NOTE — Assessment & Plan Note (Signed)
Her recent vitamin B12 level and iron studies are adequate This is due to anemia chronic disease and related to recent chemotherapy She does not need transfusion support today

## 2019-10-27 NOTE — Assessment & Plan Note (Signed)
She has minimum pain She will take pain medicine as needed

## 2019-10-27 NOTE — Assessment & Plan Note (Signed)
She is not symptomatic We will order CT scan after 3-4 cycles of treatment to assess

## 2019-10-27 NOTE — Progress Notes (Signed)
   Covid-19 Vaccination Clinic  Name:  Kristina Flores    MRN: [id number] DOB: 03/04/1960  10/27/2019  Kristina Flores was observed post Covid-19 immunization for 15 minutes without incident. She was provided with Vaccine Information Sheet and instruction to access the V-Safe system.   Kristina Flores was instructed to call 911 with any severe reactions post vaccine: Marland Kitchen Difficulty breathing  . Swelling of face and throat  . A fast heartbeat  . A bad rash all over body  . Dizziness and weakness

## 2019-10-27 NOTE — Assessment & Plan Note (Addendum)
She tolerated recent chemotherapy very well Her recent weight loss is likely due to resolution of ascites I will adjust the chemotherapy based on the most current weight and her blood work I recommend minimum 3-4 cycles of treatment before repeating CT imaging for objective assessment of response to therapy Clinically, she appears to be responding well Her recent tumor marker is coming down Her case was recently discussed at the GYN oncology tumor board She has appointment to see GYN surgeon tomorrow for further follow-up We discussed the role of neoadjuvant chemotherapy She is aware of the curative intent

## 2019-10-27 NOTE — Assessment & Plan Note (Signed)
She has bilateral lower extremity edema and cyanotic changes consistent with signs of peripheral vascular disease She has appointment to see vascular surgery for further assessment She will continue anticoagulation therapy

## 2019-10-27 NOTE — Assessment & Plan Note (Signed)
This is resolved on exam She does not need paracentesis

## 2019-10-28 ENCOUNTER — Encounter: Payer: Self-pay | Admitting: Gynecologic Oncology

## 2019-10-28 ENCOUNTER — Inpatient Hospital Stay (HOSPITAL_BASED_OUTPATIENT_CLINIC_OR_DEPARTMENT_OTHER): Payer: 59 | Admitting: Gynecologic Oncology

## 2019-10-28 ENCOUNTER — Other Ambulatory Visit: Payer: Self-pay

## 2019-10-28 ENCOUNTER — Telehealth: Payer: Self-pay | Admitting: Oncology

## 2019-10-28 VITALS — BP 115/66 | HR 90 | Temp 98.2°F | Resp 18 | Ht 64.0 in | Wt 144.6 lb

## 2019-10-28 DIAGNOSIS — R918 Other nonspecific abnormal finding of lung field: Secondary | ICD-10-CM | POA: Diagnosis not present

## 2019-10-28 DIAGNOSIS — D61818 Other pancytopenia: Secondary | ICD-10-CM | POA: Diagnosis not present

## 2019-10-28 DIAGNOSIS — C569 Malignant neoplasm of unspecified ovary: Secondary | ICD-10-CM

## 2019-10-28 DIAGNOSIS — I2699 Other pulmonary embolism without acute cor pulmonale: Secondary | ICD-10-CM | POA: Diagnosis not present

## 2019-10-28 DIAGNOSIS — Z23 Encounter for immunization: Secondary | ICD-10-CM | POA: Diagnosis not present

## 2019-10-28 DIAGNOSIS — C786 Secondary malignant neoplasm of retroperitoneum and peritoneum: Secondary | ICD-10-CM | POA: Diagnosis not present

## 2019-10-28 DIAGNOSIS — Z5111 Encounter for antineoplastic chemotherapy: Secondary | ICD-10-CM | POA: Diagnosis not present

## 2019-10-28 DIAGNOSIS — R180 Malignant ascites: Secondary | ICD-10-CM | POA: Diagnosis not present

## 2019-10-28 DIAGNOSIS — I82403 Acute embolism and thrombosis of unspecified deep veins of lower extremity, bilateral: Secondary | ICD-10-CM | POA: Diagnosis not present

## 2019-10-28 NOTE — Patient Instructions (Signed)
Dr Alvy Bimler will work with our navigator Elmo Putt to facilitate an appointment to see me after another 3 or 4 cycles of chemotherapy, at which point we will examine you and discuss whether surgery is appropriate at that time. Surgery typically involves removal of the uterus, cervix, tube and ovaries, omentum, and possibly bowel (such as the rectum).   Chemotherapy typically follows surgery, for a few more doses (cycles).   Dr Serita Grit office can be reached at [phone].

## 2019-10-28 NOTE — Telephone Encounter (Signed)
Left a message with Pathology at [facility] [phone]) regarding status of slides.  Requested a return call.

## 2019-10-28 NOTE — Telephone Encounter (Signed)
Received a call back from [facility].  The slides were sent on Wednesday and should have been received this morning.

## 2019-10-28 NOTE — Progress Notes (Signed)
Consult Note: Gyn-Onc  Consult was requested by Dr. Alvy Bimler for the evaluation of Kristina Flores 59 y.o. female  CC:  Chief Complaint  Patient presents with  . Ovarian Cancer    Assessment/Plan:  Kristina Flores  is a 59 y.o.  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 BRCA undetermined.   Together I reviewed Kristina. [facility] CT imaging from September 2021 that was performed in [name].  I demonstrated to the patient and her husband the distribution of disease at the time of diagnosis we reviewed the bulky pelvic mass at that time that was in close proximity to the rectum.  We reviewed the evidence of upper abdominal disease and ascites.  I discussed that I believe she likely has stage 3C ovarian cancer. I discussed that the treatment approach for this disease is typically combination of cytoreductive surgery and chemotherapy. I discussed that sequencing of this can be either with upfront debulking followed by adjuvant chemotherapy sequentially or neoadjuvant chemotherapy followed by an interval cytoreductive attempt, then additional chemotherapy. This latter approach is associated with a reduced perioperative morbidity at the time of surgery.  I discussed that decisions regarding sequencing of therapy is individualized taking into account individual patient health, in addition to the apparent tumor distribution on imaging, and likelihood of complete surgical resection at the time of surgery. I discussed that the overall survival observed in patients is equivalent for both approaches (neoadjuvant chemotherapy versus primary debulking surgery) provided that there is an optimal cytoreductive effort at the time of surgery (regardless of the timing of that surgery). Given that she has had recent massive PEs and DVTs and needs to be on anticoagulant therapy for at least 3 months prior to interruption, she is not a candidate for  primary debulking surgery and requires at least 3 months of continuous chemotherapy before we contemplate cytoreduction.  She will return for additional cycles of chemotherapy with Dr. Alvy Bimler as scheduled.  After the third cycle she will have repeat imaging to evaluate for response.  Prior to contemplation of surgery we will again repeat imaging.  I discussed that based on her initial imaging findings, I suspect she will likely need exploratory laparotomy for debulking surgery.  However if she has dramatic response to neoadjuvant chemotherapy a minimally invasive approach approach certainly may be possible.  I explained that given the bulky nature of her tumor with posterior extension, there is a high probability that she will require rectal resection at the time of her surgery.  However the the likelihood of this decreases with neoadjuvant chemotherapy and therefore we will reserve planning for this pending results of future scans.  With respect to her IVC filter, I explained that we typically remove these as they can become prothrombotic.  I favor an approach of transitioning to Lovenox for bridging perioperatively rather than use of an IVC filter.  Dr. Simeon Craft such as making plans to have the IVC filter removed.   HPI: Kristina Flores is a very pleasant 59 year old P3 who was seen in consultation at the request of Dr Alvy Bimler for evaluation of stage IIIc high-grade serous ovarian cancer.  The patient reported feeling vague symptoms of abdominal bloating and early satiety in late August and early September 2021.  She attributed this to some decreased activity due to her recent fractured toe.  She mentioned to her primary care physician who performed a pelvic exam which was unremarkable  at that time and ordered a plain abdominal x-ray which showed stool burden but no other changes.  The patient subsequently departed for a planned trip to [name]  While out  camping in [place] she developed severe shortness of breath and progressive abdominal distention and discomfort.  This caught her camping trip short and she was seen by local emergency department in [name] where she was admitted and CT scans were performed.  The CT scans showed pulmonary embolisms in all lobes.  A large right central pelvic mass which is likely an ovarian neoplasm measuring at least 11 cm.  There were omental and peritoneal metastases with significant ascites.  A small left uterine calcification consistent with a fibroid was seen.  There was small right and left lung base nodules which could represent sequelae of previous infection or possible metastases.  She was admitted and an IVC filter was placed on September 30, 2019.  Tumor markers on October 03, 2019 included a Ca1 25 which was elevated at 713.  CA 19-9 was elevated to 100.  CEA was normal at 2.9.  Paracentesis was performed on September 30, 2019 and showed adenocarcinoma. CT-guided biopsy of the pelvic mass on October 03, 2019 showed adenocarcinoma with malignant stains positive for keratin, CK7 and MOC-31.  There were negative for CK20 calretinin CDX2, ER, and WT 1.  The findings were consistent with a poorly differentiated adenocarcinoma of possible gynecologic primary.  Pancreas biliary urothelial primaries could also be considered.  She received her first cycle of carboplatin and paclitaxel as an inpatient on October 23, 2019.  She had been started on Eliquis for the DVT and PE.  An IVC filter had been placed as stated above.  She tolerated her first cycle of chemotherapy very well and had resolution of her abdominal symptoms of ascites.  She has mild constipation.  Energy levels are low.  However, she feels much better overall.  She was able to return back for [place] in mid October 2021.   Current Meds:  Outpatient Encounter Medications as of 10/28/2019  Medication Sig  . cetirizine (ZYRTEC) 10 MG  chewable tablet Chew 10 mg by mouth daily. One tablet daily  . Cholecalciferol 1000 UNITS capsule Take 2,000 Units by mouth daily.  Marland Kitchen ELIQUIS 5 MG TABS tablet Take 1 tablet (5 mg total) by mouth 2 (two) times daily.  Marland Kitchen gabapentin (NEURONTIN) 300 MG capsule Take 900 mg by mouth 3 (three) times daily.  . pantoprazole (PROTONIX) 40 MG tablet Take 40 mg by mouth daily.  . vitamin B-12 (CYANOCOBALAMIN) 1000 MCG tablet Take 1,000 mcg by mouth daily.  Marland Kitchen dexamethasone (DECADRON) 4 MG tablet Take 2 tabs at the night before and 2 tab the morning of chemotherapy, every 3 weeks, by mouth x 6 cycles (Patient not taking: Reported on 10/28/2019)  . HYDROmorphone (DILAUDID) 4 MG tablet Take 1 tablet (4 mg total) by mouth every 4 (four) hours as needed for severe pain. (Patient not taking: Reported on 10/28/2019)  . ondansetron (ZOFRAN) 8 MG tablet Take by mouth every 8 (eight) hours as needed for nausea or vomiting. (Patient not taking: Reported on 10/28/2019)  . prochlorperazine (COMPAZINE) 10 MG tablet Take 1 tablet (10 mg total) by mouth every 6 (six) hours as needed (Nausea or vomiting). (Patient not taking: Reported on 10/28/2019)  . [DISCONTINUED] ELIQUIS 5 MG TABS tablet Take 5 mg by mouth 2 (two) times daily.  No facility-administered encounter medications on file as of 10/28/2019.    Allergy: No Known Allergies  Social Hx:   Social History   Socioeconomic History  . Marital status: Married    Spouse name: [name]  . Number of children: 3  . Years of education: College  . Highest education level: Not on file  Occupational History  . Occupation: PRESCHOOL Product manager: [employer]  Tobacco Use  . Smoking status: Never Smoker  . Smokeless tobacco: Never Used  Vaping Use  . Vaping Use: Never used  Substance and Sexual Activity  . Alcohol use: Yes  . Drug use: No  . Sexual activity: Yes    Partners: Male    Birth control/protection: Post-menopausal  Other Topics Concern  . Not  on file  Social History Narrative   Patient is married [name]) and lives at home with her husband. Married x 36 years.   Patient has three adult children; 2 in [place] (30, 41); one in [place] (25).  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   Patient has a college education.   Patient is right-handed.   Patient drinks one cup of coffee daily.   Pillates three times per week for sixteen years.     Social Determinants of Health   Financial Resource Strain:   . Difficulty of Paying Living Expenses: Not on file  Food Insecurity:   . Worried About Charity fundraiser in the Last Year: Not on file  . Ran Out of Food in the Last Year: Not on file  Transportation Needs:   . Lack of Transportation (Medical): Not on file  . Lack of Transportation (Non-Medical): Not on file  Physical Activity:   . Days of Exercise per Week: Not on file  . Minutes of Exercise per Session: Not on file  Stress:   . Feeling of Stress : Not on file  Social Connections:   . Frequency of Communication with Friends and Family: Not on file  . Frequency of Social Gatherings with Friends and Family: Not on file  . Attends Religious Services: Not on file  . Active Member of Clubs or Organizations: Not on file  . Attends Archivist Meetings: Not on file  . Marital Status: Not on file  Intimate Partner Violence:   . Fear of Current or Ex-Partner: Not on file  . Emotionally Abused: Not on file  . Physically Abused: Not on file  . Sexually Abused: Not on file    Past Surgical Hx:  Past Surgical History:  Procedure Laterality Date  . fibroid in 2011,2003    . miscarriage 1989    . ovarian cyst 1992      Past Medical Hx:  Past Medical History:  Diagnosis Date  . Allergy   . Cancer ([place])   . DVT (deep venous thrombosis) ([place])   . Heart murmur   . Numbness and tingling   . Osteopenia 08/2016   T score -1.6 FRAX 7.8%/0.7%  .  Peripheral neuropathy, idiopathic   . Pulmonary embolism ([facility])     Past Gynecological History:  SVD x 3 No LMP recorded. Patient is postmenopausal.  Family Hx:  Family History  Problem Relation Age of Onset  . Cancer Mother 16       leukemia  . Colon cancer Father 29  . Cancer Father        prostate cancer, colon cancer  .  Arthritis Father   . Colon cancer Paternal Uncle   . Esophageal cancer Neg Hx   . Rectal cancer Neg Hx   . Stomach cancer Neg Hx     Review of Systems:  Constitutional  Feels fatigued ENT Normal appearing ears and nares bilaterally Skin/Breast  No rash, sores, jaundice, itching, dryness Cardiovascular  No chest pain, shortness of breath, or edema  Pulmonary  No cough or wheeze.  Gastro Intestinal  + bloating and early satiety, + constipation Genito Urinary  No frequency, urgency, dysuria, no bleeding Musculo Skeletal  No myalgia, arthralgia, joint swelling or pain  Neurologic  No weakness, numbness, change in gait,  Psychology  No depression, anxiety, insomnia.   Vitals:  Blood pressure 115/66, pulse 90, temperature 98.2 F (36.8 C), temperature source Tympanic, resp. rate 18, height 5' 4" (1.626 m), weight 144 lb 9.6 oz (65.6 kg), SpO2 98 %.  Physical Exam: WD in NAD Neck  Supple NROM, without any enlargements.  Lymph Node Survey No cervical supraclavicular or inguinal adenopathy Cardiovascular  Well perfused peripheries Lungs  No increased WOB Skin  No rash/lesions/breakdown  Psychiatry  Alert and oriented to person, place, and time  Abdomen  Normoactive bowel sounds, abdomen soft, non-tender and nonobese without evidence of hernia. Back No CVA tenderness Genito Urinary  Vulva/vagina: Normal external female genitalia.  No lesions. No discharge or bleeding.  Bladder/urethra:  No lesions or masses, well supported bladder  Vagina: smooth, no lesions  Cervix: Normal appearing, no lesions.  Uterus:  Small, mobile, no parametrial  involvement or nodularity.  Adnexa: palpable fullness in posterior cul de sac, in close proximity with rectum but without apparent invasion.   Rectal  Good tone, + mass as stated above, not fixed.  no cul de sac nodularity.  Extremities  No bilateral cyanosis, clubbing or edema.  60 minutes of total time was spent for this patient encounter, including preparation, face-to-face counseling with the patient and coordination of care, review of imaging (results and images), communication with the referring provider and documentation of the encounter.   Thereasa Solo, MD  10/28/2019, 5:18 PM

## 2019-10-31 ENCOUNTER — Other Ambulatory Visit: Payer: Self-pay

## 2019-10-31 ENCOUNTER — Inpatient Hospital Stay: Payer: 59

## 2019-10-31 ENCOUNTER — Other Ambulatory Visit: Payer: Self-pay | Admitting: *Deleted

## 2019-10-31 VITALS — BP 142/71 | HR 87 | Temp 98.1°F | Resp 17

## 2019-10-31 DIAGNOSIS — R918 Other nonspecific abnormal finding of lung field: Secondary | ICD-10-CM | POA: Diagnosis not present

## 2019-10-31 DIAGNOSIS — C569 Malignant neoplasm of unspecified ovary: Secondary | ICD-10-CM | POA: Diagnosis not present

## 2019-10-31 DIAGNOSIS — D61818 Other pancytopenia: Secondary | ICD-10-CM | POA: Diagnosis not present

## 2019-10-31 DIAGNOSIS — R180 Malignant ascites: Secondary | ICD-10-CM | POA: Diagnosis not present

## 2019-10-31 DIAGNOSIS — C786 Secondary malignant neoplasm of retroperitoneum and peritoneum: Secondary | ICD-10-CM | POA: Diagnosis not present

## 2019-10-31 DIAGNOSIS — I82403 Acute embolism and thrombosis of unspecified deep veins of lower extremity, bilateral: Secondary | ICD-10-CM | POA: Diagnosis not present

## 2019-10-31 DIAGNOSIS — Z23 Encounter for immunization: Secondary | ICD-10-CM | POA: Diagnosis not present

## 2019-10-31 DIAGNOSIS — I2699 Other pulmonary embolism without acute cor pulmonale: Secondary | ICD-10-CM | POA: Diagnosis not present

## 2019-10-31 DIAGNOSIS — Z5111 Encounter for antineoplastic chemotherapy: Secondary | ICD-10-CM | POA: Diagnosis not present

## 2019-10-31 MED ORDER — SODIUM CHLORIDE 0.9 % IV SOLN
615.6000 mg | Freq: Once | INTRAVENOUS | Status: AC
  Administered 2019-10-31: 620 mg via INTRAVENOUS
  Filled 2019-10-31: qty 62

## 2019-10-31 MED ORDER — SODIUM CHLORIDE 0.9 % IV SOLN
Freq: Once | INTRAVENOUS | Status: AC
  Filled 2019-10-31: qty 250

## 2019-10-31 MED ORDER — PALONOSETRON HCL INJECTION 0.25 MG/5ML
INTRAVENOUS | Status: AC
  Filled 2019-10-31: qty 5

## 2019-10-31 MED ORDER — FAMOTIDINE IN NACL 20-0.9 MG/50ML-% IV SOLN
20.0000 mg | Freq: Once | INTRAVENOUS | Status: AC
  Administered 2019-10-31: 20 mg via INTRAVENOUS

## 2019-10-31 MED ORDER — DIPHENHYDRAMINE HCL 50 MG/ML IJ SOLN
12.5000 mg | Freq: Once | INTRAMUSCULAR | Status: AC
  Administered 2019-10-31: 12.5 mg via INTRAVENOUS

## 2019-10-31 MED ORDER — PALONOSETRON HCL INJECTION 0.25 MG/5ML
0.2500 mg | Freq: Once | INTRAVENOUS | Status: AC
  Administered 2019-10-31: 0.25 mg via INTRAVENOUS

## 2019-10-31 MED ORDER — DIPHENHYDRAMINE HCL 50 MG/ML IJ SOLN
INTRAMUSCULAR | Status: AC
  Filled 2019-10-31: qty 1

## 2019-10-31 MED ORDER — FAMOTIDINE IN NACL 20-0.9 MG/50ML-% IV SOLN
INTRAVENOUS | Status: AC
  Filled 2019-10-31: qty 50

## 2019-10-31 MED ORDER — SODIUM CHLORIDE 0.9 % IV SOLN
150.0000 mg | Freq: Once | INTRAVENOUS | Status: AC
  Administered 2019-10-31: 150 mg via INTRAVENOUS
  Filled 2019-10-31: qty 150

## 2019-10-31 MED ORDER — SODIUM CHLORIDE 0.9 % IV SOLN
10.0000 mg | Freq: Once | INTRAVENOUS | Status: AC
  Administered 2019-10-31: 10 mg via INTRAVENOUS
  Filled 2019-10-31: qty 10

## 2019-10-31 MED ORDER — SODIUM CHLORIDE 0.9 % IV SOLN
175.0000 mg/m2 | Freq: Once | INTRAVENOUS | Status: AC
  Administered 2019-10-31: 300 mg via INTRAVENOUS
  Filled 2019-10-31: qty 50

## 2019-10-31 NOTE — Patient Outreach (Signed)
[facility]) Care Management  10/31/2019  Kristina Flores [facility] [birth year] [id number]   Case Closure   Received after hours voicemail message from patient, providing her identification stating that she does not need assistance with coordinating home health services. She discussed that her oncology doctor has ordered physical therapy through outpatient through [facility].In message she states that she does not need return call back .   Objective: Kristina Flores was Hospitalized at [facility] in [place] , 9/16-10/2/21, for Bilateral DVT, Pulmonary embolus, IVC filter placed, Ovarian mass cancer diagnosis started on chemotherapy.   Plan No further care management needs identified, will close case to [facility] care management .    Joylene Draft, RN, BSN  [place] Management Coordinator  [phone]- Mobile [phone]- Toll Free Main Office

## 2019-10-31 NOTE — Patient Instructions (Signed)
[facility] Discharge Instructions for Patients Receiving Chemotherapy  Today you received the following chemotherapy agents: taxol, carboplatin   To help prevent nausea and vomiting after your treatment, we encourage you to take your nausea medication as directed.    If you develop nausea and vomiting that is not controlled by your nausea medication, call the clinic.   BELOW ARE SYMPTOMS THAT SHOULD BE REPORTED IMMEDIATELY:  *FEVER GREATER THAN 100.5 F  *CHILLS WITH OR WITHOUT FEVER  NAUSEA AND VOMITING THAT IS NOT CONTROLLED WITH YOUR NAUSEA MEDICATION  *UNUSUAL SHORTNESS OF BREATH  *UNUSUAL BRUISING OR BLEEDING  TENDERNESS IN MOUTH AND THROAT WITH OR WITHOUT PRESENCE OF ULCERS  *URINARY PROBLEMS  *BOWEL PROBLEMS  UNUSUAL RASH Items with * indicate a potential emergency and should be followed up as soon as possible.  Feel free to call the clinic should you have any questions or concerns. The clinic phone number is [phone].  Please show the CHEMO ALERT CARD at check-in to the Emergency Department and triage nurse.   

## 2019-11-07 MED FILL — ELIQUIS 5 MG TABLET: 5 | 30 days supply | Qty: 60 | Fill #0

## 2019-11-14 ENCOUNTER — Telehealth: Payer: Self-pay

## 2019-11-14 NOTE — Telephone Encounter (Signed)
Kristina Flores with [facility], case manager called and left a message. If we need assistance with coordinating care please call her. [phone], Ext. [phone].

## 2019-11-15 ENCOUNTER — Telehealth: Payer: Self-pay

## 2019-11-15 DIAGNOSIS — I2699 Other pulmonary embolism without acute cor pulmonale: Secondary | ICD-10-CM | POA: Diagnosis not present

## 2019-11-15 DIAGNOSIS — R918 Other nonspecific abnormal finding of lung field: Secondary | ICD-10-CM | POA: Diagnosis not present

## 2019-11-15 NOTE — Telephone Encounter (Signed)
Cancer Rehab called and left a message about scheduling appt. On referral it says do not schedule?  Monic is calling trying to schedule rehab appt.   Called back and spoke with Caryl Pina at [facility]. Told her per Dr. Alvy Bimler to please schedule. She will call Lelon Frohlich and schedule.

## 2019-11-16 ENCOUNTER — Ambulatory Visit: Payer: 59 | Attending: Hematology and Oncology

## 2019-11-16 ENCOUNTER — Other Ambulatory Visit: Payer: Self-pay

## 2019-11-16 DIAGNOSIS — C569 Malignant neoplasm of unspecified ovary: Secondary | ICD-10-CM | POA: Diagnosis not present

## 2019-11-16 DIAGNOSIS — M6281 Muscle weakness (generalized): Secondary | ICD-10-CM | POA: Insufficient documentation

## 2019-11-16 NOTE — Patient Instructions (Signed)
Pt advised to start walking continuously in the home for 3-4 minutes 2xs per day as long as she can do without undue fatigue.

## 2019-11-16 NOTE — Therapy (Signed)
[place], [place], [address] Phone: [phone]   Fax:  [phone]  Physical Therapy Evaluation  Patient Details  Name: Kristina Flores MRN: [id number] Date of Birth: 12-21-1960 Referring Provider (PT): Dr. Alvy Bimler   Encounter Date: 11/16/2019   PT End of Session - 11/16/19 1336    Visit Number 1    Number of Visits 17    Date for PT Re-Evaluation 01/11/20    PT Start Time 0905    PT Stop Time 1000    PT Time Calculation (min) 55 min    Activity Tolerance Patient tolerated treatment well    Behavior During Therapy [facility] for tasks assessed/performed           Past Medical History:  Diagnosis Date  . Allergy   . Cancer ([place])   . DVT (deep venous thrombosis) ([place])   . Heart murmur   . Numbness and tingling   . Osteopenia 08/2016   T score -1.6 FRAX 7.8%/0.7%  . Peripheral neuropathy, idiopathic   . Pulmonary embolism [facility])     Past Surgical History:  Procedure Laterality Date  . fibroid in 2011,2003    . miscarriage 1989    . ovarian cyst 1992      There were no vitals filed for this visit.    Subjective Assessment - 11/16/19 0910    Subjective Had swelling in both legs while in [name] and her legs were huge when she flew home.  A week of elevation removed the swelling from her legs.  She does not feel she has swelling now.  She is here now for conditioning. Fatigues quickly, can walk to the mailbox but it fatigues her.  Pt CIPN and has idiopathic neuropathy also so she is unsteady,. Her toes now healing alos affects balance.    Pertinent History Had some bloating before she went on trip and while in [name] developed blood clots in both legs and both lungs.  Found out then that she had Cancer.  Had the IVC filter placed and was hospitalized 09/29/19 - 10/15/19.  Started chemotherapy there and has had one more since being back.  Will continue chemo and eventually have surgery for Ovarian CA.   Is on chronic eliquis for DVT. Had appt with Vascular and learned small vessels in toes were affected but are now healing. No longer having to use O2 at night or in the daytime.    How long can you walk comfortably? to the mailbox and back    Currently in Pain? Yes    Pain Score 6     Pain Location --   Right side   Pain Orientation Right;Anterior    Pain Descriptors / Indicators Sharp    Pain Type Acute pain    Pain Onset 1 to 4 weeks ago    Pain Frequency Intermittent    Aggravating Factors  lying on right side    Pain Relieving Factors lying in any position except right side              [facility] PT Assessment - 11/16/19 0001      Assessment   Referring Provider (PT) Dr. Alvy Bimler    Hand Dominance Right      Restrictions   Weight Bearing Restrictions No      Balance Screen   Has the patient fallen in the past 6 months No    Has the patient had a decrease in activity level because of a  fear of falling?  Yes    Is the patient reluctant to leave their home because of a fear of falling?  No      Home Ecologist residence    Living Arrangements Spouse/significant other    Available Help at Discharge Family      Prior Function   Level of Independence Independent    Vocation Retired    Leisure Best boy, biking      Cognition   Overall Cognitive Status Within Functional Limits for tasks assessed      Observation/Other Assessments   Observations --   Bilateral 2nd toes with some skin breakdown healing now.    Skin Integrity --   improving bilateral 2nd toes, all others healed     Sit to Stand   Comments 8 times in 30 sec      Posture/Postural Control   Posture/Postural Control Postural limitations    Postural Limitations Forward head;Rounded Shoulders      Strength   Overall Strength Comments WNL      Balance   Balance Assessed Yes    Balance comment 4 stage balance; able to hold but arm flailing with L SLS, and tandem stance       Timed Up and Go Test   Normal TUG (seconds) 12   with sway and balance deficits noted            LYMPHEDEMA/ONCOLOGY QUESTIONNAIRE - 11/16/19 0001      Treatment   Active Chemotherapy Treatment Yes    Past Chemotherapy Treatment No    Past Radiation Treatment No      What other symptoms do you have   Are you Having Heaviness or Tightness No    Are you having Pain Yes    Are you having pitting edema No    Is it Hard or Difficult finding clothes that fit No    Do you have infections No      Right Lower Extremity Lymphedema   At Groin Measure at Horizontal from Pubic Bone 56.2 cm    20 cm Proximal to Suprapatella 50 cm    10 cm Proximal to Suprapatella 43.4 cm    At Midpatella/Popliteal Crease 35.5 cm    30 cm Proximal to Floor at Lateral Plantar Foot 36.5 cm    20 cm Proximal to Floor at Lateral Plantar Foot 27.7 1    10  cm Proximal to Floor at Lateral Malleoli 22 cm    5 cm Proximal to 1st MTP Joint 21.5 cm    Around Proximal Great Toe 8 cm      Left Lower Extremity Lymphedema   At Groin Measure at Horizontal from Pubic Bone 56 cm    20 cm Proximal to Suprapatella 51.2 cm    10 cm Proximal to Suprapatella 45 cm    At Midpatella/Popliteal Crease 37.5 cm    30 cm Proximal to Floor at Lateral Plantar Foot 36.4 cm    20 cm Proximal to Floor at Lateral Plantar Foot 27 cm    10 cm Proximal to Floor at Lateral Malleoli 21.7 cm    5 cm Proximal to 1st MTP Joint 21 cm    Around Proximal Great Toe 7.5 cm                   Objective measurements completed on examination: See above findings.                 PT Short  Term Goals - 11/16/19 1330      PT SHORT TERM GOAL #1   Title Pt will be independent in a HEP for generalized strength and conditioning.    Time 3    Period Weeks    Status New    Target Date 12/07/19      PT SHORT TERM GOAL #2   Title Pt will be able to walk to the mailbox and back 2-3 times without undue fatigue    Time 3     Period Weeks    Status New    Target Date 12/07/19             PT Long Term Goals - 11/16/19 1331      PT LONG TERM GOAL #1   Title Pt will be able to walk 10 min -20 minutes at her own pace without undue fatigue    Time 8    Period Weeks    Status New    Target Date 01/11/20      PT LONG TERM GOAL #2   Title Pt will be able to perform 12 repetions of sit to stand in 30 seconds to decrease fall risk    Baseline 8    Time 8    Period Weeks    Status New      PT LONG TERM GOAL #3   Title Pt will be able to maintain SLS on left leg for atleast 10 seconds without arm flailing for improved safety    Baseline 10 sec but continuous arm flailing    Time 8    Period Weeks    Status New      PT LONG TERM GOAL #4   Title pt. will be independant in a more advanced HEP and will be confident for release to HEP    Time 8    Period Weeks    Status New    Target Date 01/11/20                  Plan - 11/16/19 1337    Clinical Impression Statement Pt reports to therapy with main complaints of deconditioning.  She did have LE swelling but it has resolved.She is no longer using O2.  she still has chemo approx every 3 weeks and for about 10 days after that she will not be able to attend therapy. She went through a trying time with a new diagnosis of Ovarian CA and multiple LE DVTs and PE's while on vacation in [place].  She still has some skin breakdown of bilateral 2nd toes, however this is healing well.  At one time she thought she might lose her toes.  She is here primarily for conditioning.    Personal Factors and Comorbidities Comorbidity 2    Comorbidities Ovarian CA, recent DVT's, PEs on chronic Eliquis    Examination-Activity Limitations Sleep;Squat;Locomotion Level    Stability/Clinical Decision Making Stable/Uncomplicated    Clinical Decision Making Low    Rehab Potential Good    PT Frequency 2x / week    PT Duration 8 weeks    PT Treatment/Interventions Therapeutic  activities;Functional mobility training;Therapeutic exercise;Balance training;Neuromuscular re-education;Patient/family education;Energy conservation    PT Next Visit Plan generalized conditioning: AROM of arms, legs, Core, bike    PT Home Exercise Plan pt to start with 3 min continuous walk in home 2xs per day and increase 1 minute every few days as long as she is not that fatigued    Consulted and Agree  with Plan of Care Patient           Patient will benefit from skilled therapeutic intervention in order to improve the following deficits and impairments:  Decreased balance, Decreased endurance, Difficulty walking, Impaired sensation, Decreased activity tolerance, Decreased strength, Postural dysfunction, Pain  Visit Diagnosis: Malignant neoplasm of ovary, unspecified laterality (HCC)  Generalized muscle weakness     Problem List Patient Active Problem List   Diagnosis Date Noted  . Physical deconditioning 10/27/2019  . Cancer associated pain 10/20/2019  . Multiple lung nodules on CT 10/18/2019  . Acute pulmonary embolism ([place]) 10/18/2019  . Acute deep vein thrombosis of lower leg, bilateral ([place]) 10/18/2019  . S/P IVC filter 10/18/2019  . Pancytopenia, acquired ([place]) 10/18/2019  . 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 Ovarian cancer ([place]) 10/14/2019  . Plantar fasciitis of left foot 02/17/2017  . 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 Nonallopathic lesion of lumbosacral region 03/02/2012  . Nonallopathic lesion of cervical region 03/02/2012  . DDD (degenerative disc disease), lumbar 09/20/2011  . Menopause 09/20/2011  . Allergic rhinitis 09/20/2011    Sherrin Daisy 11/16/2019, 1:49 PM  [place], [place], [address] Phone: [phone]   Fax:  [phone]  Name: Kristina Flores MRN: [id number] Date of Birth: 10/10/1960

## 2019-11-22 ENCOUNTER — Inpatient Hospital Stay: Payer: 59

## 2019-11-22 ENCOUNTER — Other Ambulatory Visit: Payer: Self-pay

## 2019-11-22 ENCOUNTER — Inpatient Hospital Stay (HOSPITAL_BASED_OUTPATIENT_CLINIC_OR_DEPARTMENT_OTHER): Payer: 59 | Admitting: Hematology and Oncology

## 2019-11-22 ENCOUNTER — Encounter: Payer: Self-pay | Admitting: Hematology and Oncology

## 2019-11-22 ENCOUNTER — Inpatient Hospital Stay: Payer: 59 | Attending: Hematology and Oncology

## 2019-11-22 ENCOUNTER — Other Ambulatory Visit: Payer: Self-pay | Admitting: Hematology and Oncology

## 2019-11-22 ENCOUNTER — Telehealth: Payer: Self-pay | Admitting: Oncology

## 2019-11-22 VITALS — BP 152/96 | HR 72 | Temp 97.7°F | Resp 16 | Ht 64.0 in | Wt 145.8 lb

## 2019-11-22 DIAGNOSIS — Z5111 Encounter for antineoplastic chemotherapy: Secondary | ICD-10-CM | POA: Diagnosis not present

## 2019-11-22 DIAGNOSIS — D61818 Other pancytopenia: Secondary | ICD-10-CM | POA: Diagnosis not present

## 2019-11-22 DIAGNOSIS — C569 Malignant neoplasm of unspecified ovary: Secondary | ICD-10-CM | POA: Diagnosis not present

## 2019-11-22 DIAGNOSIS — R180 Malignant ascites: Secondary | ICD-10-CM

## 2019-11-22 DIAGNOSIS — G609 Hereditary and idiopathic neuropathy, unspecified: Secondary | ICD-10-CM

## 2019-11-22 DIAGNOSIS — R918 Other nonspecific abnormal finding of lung field: Secondary | ICD-10-CM

## 2019-11-22 DIAGNOSIS — I2699 Other pulmonary embolism without acute cor pulmonale: Secondary | ICD-10-CM

## 2019-11-22 DIAGNOSIS — I824Z3 Acute embolism and thrombosis of unspecified deep veins of distal lower extremity, bilateral: Secondary | ICD-10-CM

## 2019-11-22 LAB — CBC WITH DIFFERENTIAL/PLATELET
Abs Immature Granulocytes: 0.02 10*3/uL (ref 0.00–0.07)
Basophils Absolute: 0 10*3/uL (ref 0.0–0.1)
Basophils Relative: 0 %
Eosinophils Absolute: 0 10*3/uL (ref 0.0–0.5)
Eosinophils Relative: 0 %
HCT: 24.2 % — ABNORMAL LOW (ref 36.0–46.0)
Hemoglobin: 8.1 g/dL — ABNORMAL LOW (ref 12.0–15.0)
Immature Granulocytes: 0 %
Lymphocytes Relative: 8 %
Lymphs Abs: 0.5 10*3/uL — ABNORMAL LOW (ref 0.7–4.0)
MCH: 30.1 pg (ref 26.0–34.0)
MCHC: 33.5 g/dL (ref 30.0–36.0)
MCV: 90 fL (ref 80.0–100.0)
Monocytes Absolute: 0.2 10*3/uL (ref 0.1–1.0)
Monocytes Relative: 3 %
Neutro Abs: 5.9 10*3/uL (ref 1.7–7.7)
Neutrophils Relative %: 89 %
Platelets: 148 10*3/uL — ABNORMAL LOW (ref 150–400)
RBC: 2.69 MIL/uL — ABNORMAL LOW (ref 3.87–5.11)
RDW: 17.8 % — ABNORMAL HIGH (ref 11.5–15.5)
WBC: 6.7 10*3/uL (ref 4.0–10.5)
nRBC: 0 % (ref 0.0–0.2)

## 2019-11-22 LAB — COMPREHENSIVE METABOLIC PANEL
ALT: 12 U/L (ref 0–44)
AST: 15 U/L (ref 15–41)
Albumin: 3.7 g/dL (ref 3.5–5.0)
Alkaline Phosphatase: 86 U/L (ref 38–126)
Anion gap: 5 (ref 5–15)
BUN: 17 mg/dL (ref 8–23)
CO2: 28 mmol/L (ref 22–32)
Calcium: 9.2 mg/dL (ref 8.9–10.3)
Chloride: 105 mmol/L (ref 98–111)
Creatinine, Ser: 0.8 mg/dL (ref 0.44–1.00)
GFR, Estimated: >60 mL/min (ref >60)
Glucose, Bld: 112 mg/dL — ABNORMAL HIGH (ref 70–99)
Potassium: 3.9 mmol/L (ref 3.5–5.1)
Sodium: 138 mmol/L (ref 135–145)
Total Bilirubin: 0.4 mg/dL (ref 0.3–1.2)
Total Protein: 7.7 g/dL (ref 6.5–8.1)

## 2019-11-22 LAB — SAMPLE TO BLOOD BANK

## 2019-11-22 MED ORDER — SODIUM CHLORIDE 0.9 % IV SOLN
Freq: Once | INTRAVENOUS | Status: AC
  Filled 2019-11-22: qty 250

## 2019-11-22 MED ORDER — FAMOTIDINE IN NACL 20-0.9 MG/50ML-% IV SOLN
20.0000 mg | Freq: Once | INTRAVENOUS | Status: AC
  Administered 2019-11-22: 20 mg via INTRAVENOUS

## 2019-11-22 MED ORDER — SODIUM CHLORIDE 0.9 % IV SOLN
140.0000 mg/m2 | Freq: Once | INTRAVENOUS | Status: AC
  Administered 2019-11-22: 240 mg via INTRAVENOUS
  Filled 2019-11-22: qty 40

## 2019-11-22 MED ORDER — DIPHENHYDRAMINE HCL 50 MG/ML IJ SOLN
INTRAMUSCULAR | Status: AC
  Filled 2019-11-22: qty 1

## 2019-11-22 MED ORDER — FAMOTIDINE IN NACL 20-0.9 MG/50ML-% IV SOLN
INTRAVENOUS | Status: AC
  Filled 2019-11-22: qty 50

## 2019-11-22 MED ORDER — PANTOPRAZOLE SODIUM 40 MG PO TBEC
40.0000 mg | DELAYED_RELEASE_TABLET | Freq: Every day | ORAL | 1 refills | Status: DC
Start: 2019-11-22 — End: 2019-11-22

## 2019-11-22 MED ORDER — SODIUM CHLORIDE 0.9 % IV SOLN
150.0000 mg | Freq: Once | INTRAVENOUS | Status: AC
  Administered 2019-11-22: 150 mg via INTRAVENOUS
  Filled 2019-11-22: qty 150

## 2019-11-22 MED ORDER — DIPHENHYDRAMINE HCL 50 MG/ML IJ SOLN
12.5000 mg | Freq: Once | INTRAMUSCULAR | Status: AC
  Administered 2019-11-22: 12.5 mg via INTRAVENOUS

## 2019-11-22 MED ORDER — PALONOSETRON HCL INJECTION 0.25 MG/5ML
INTRAVENOUS | Status: AC
  Filled 2019-11-22: qty 5

## 2019-11-22 MED ORDER — SODIUM CHLORIDE 0.9 % IV SOLN
615.6000 mg | Freq: Once | INTRAVENOUS | Status: AC
  Administered 2019-11-22: 620 mg via INTRAVENOUS
  Filled 2019-11-22: qty 62

## 2019-11-22 MED ORDER — PALONOSETRON HCL INJECTION 0.25 MG/5ML
0.2500 mg | Freq: Once | INTRAVENOUS | Status: AC
  Administered 2019-11-22: 0.25 mg via INTRAVENOUS

## 2019-11-22 MED ORDER — SODIUM CHLORIDE 0.9 % IV SOLN
10.0000 mg | Freq: Once | INTRAVENOUS | Status: AC
  Administered 2019-11-22: 10 mg via INTRAVENOUS
  Filled 2019-11-22: qty 10

## 2019-11-22 MED FILL — PANTOPRAZOLE SOD DR 40 MG T: 40 | 90 days supply | Qty: 90 | Fill #0

## 2019-11-22 NOTE — Progress Notes (Signed)
[place] OFFICE PROGRESS NOTE  Patient Care Team: Rutherford Guys, MD (Inactive) as PCP - General (Family Medicine) Awanda Mink Craige Cotta, RN as Oncology Nurse Navigator (Oncology)  ASSESSMENT & PLAN:  Ovarian cancer [facility]) I reviewed plan of care with the patient and her husband She is recovering well from side effects of treatment Clinically, she have excellent response to therapy Her only complication are neuropathy and anemia I recommend mild dose adjustment today and for her to return next week to get her blood rechecked I plan to order CT imaging for objective assessment of response to therapy at the end of the month If she have good response to treatment, we can consider interval debulking surgery Otherwise, she will proceed with more chemotherapy  Pancytopenia, acquired ([place]) She has pancytopenia from treatment She will return next week to get her blood count rechecked and if her hemoglobin is less than eight, we will give her 1 unit of blood    Idiopathic neuropathy she has mild peripheral neuropathy, likely related to side effects of treatment. I plan to reduce the dose of treatment as outlined above.  I explained to the patient the rationale of this strategy and reassured the patient it would not compromise the efficacy of treatment   Acute pulmonary embolism (HCC) Shortness of breath has improved Her oxygen saturation is good I recommend discontinuation of oxygen She will continue anticoagulation therapy   Orders Placed This Encounter  Procedures  . CT CHEST ABDOMEN PELVIS W CONTRAST    Standing Status:   Future    Standing Expiration Date:   11/21/2020    Order Specific Question:   Preferred imaging location?    Answer:   [facility]    Order Specific Question:   Radiology Contrast Protocol - do NOT remove file path    Answer:   [email or web address]    All questions were answered. The patient knows to  call the clinic with any problems, questions or concerns. The total time spent in the appointment was 30 minutes encounter with patients including review of chart and various tests results, discussions about plan of care and coordination of care plan   Heath Lark, MD 11/22/2019 1:22 PM  INTERVAL HISTORY: Please see below for problem oriented charting. She returns with her husband for further follow-up She tolerated treatment very well She is off oxygen treatment Denies recent nausea or constipation She noticed some slight peripheral neuropathy affecting the tips of fingers and her feet The patient denies any recent signs or symptoms of bleeding such as spontaneous epistaxis, hematuria or hematochezia.  SUMMARY OF ONCOLOGIC HISTORY: Oncology History  Ovarian cancer ([place])  09/20/2019 Initial Diagnosis   Presented to her primary care doctor with abdominal bloating.  Abdominal x-ray imaging was unremarkable.   09/29/2019 Imaging   CT of the chest showed pulmonary embolism in all lobes.  Large right/central pelvic mass likely ovarian neoplasm with omental/peritoneal metastasis with significant free fluid in the abdomen and pelvis.  A small left uterine calcification could be seen in a degenerating fibroid.  Small right and left lung base nodules, could represent sequela of previous infection or inflammation with metastasis not excluded.   09/29/2019 - 10/15/2019 Hospital Admission   She presented to the emergency department in [name] after complaints of leg pain with bilateral lower extremity edema.  D-dimer was elevated.  Lower extremity ultrasound demonstrated extensive bilateral DVT involving peroneal and soleal calf veins on the left and peroneal vein on the right.  CT abdomen and pelvis showed large pelvic mass with omental metastasis and ascites, worrisome for ovarian cancer.  CT imaging of the chest showed bilateral pulmonary nodules as well as evidence of pulmonary embolism.  She received  anticoagulation therapy.  She was also found to be anemic, worrisome that she might have bleeding in the tumor and received transfusion.  Echocardiogram showed preserved ejection fraction but evidence of moderate right ventricular strain.  Subsequent biopsy showed cancer suspicious for ovarian primary and she received first cycle of neoadjuvant chemotherapy with carboplatin and taxol. She had IVC filter placement and temporary TPN   09/30/2019 Procedure   Ultrasound paracentesis was performed complicated by bleeding and received blood transfusion   09/30/2019 Procedure   She had IVC filter placement by interventional radiologist in [facility].   09/30/2019 Echocardiogram   Outside echocardiogram showed left ventricular ejection fraction around 79%.  Concentric left ventricular modeling.  Grade 1 diastolic pattern with normal left atrial pressure.  Right ventricle size is mildly enlarged.  Right ventricular systolic function is moderately reduced.  Right ventricle was not well visualized.  Aortic sclerosis without evidence of stenosis.  Moderate pleural effusion on the left region   10/02/2019 Procedure   EGD was performed which showed no evidence of acute bleeding.  Biopsy of the stomach showed benign gastric mucosa with reactive gastropathy as may be seen in the healing phase of erosive gastritis.  Helicobacter organism testing is negative..   10/03/2019 Procedure   She underwent CT-guided biopsy of the pelvic mass.   10/03/2019 Tumor Marker   CEA level was 2.9.   10/03/2019 Pathology Results   Biopsy show adenocarcinoma.  The malignant cells stain positive for keratin AE1/AE3, CK7 and MOC-31.  Rare cells show weak reactivity with p63 and GA TA 3.  Cells are negative for CK20, calretinin, CDX2, TTF-1, ER and WT-1.  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  Possible consideration include, but not limited to, gynecological  origin, pancreatico biliary and urothelial.   10/03/2019 Tumor Marker   Patient's tumor was tested for the following markers: CA-125 Results of the tumor marker test revealed 713 CA19-9 is 100   10/04/2019 Imaging   Tagged red blood cell scan equivocal for source of bleeding.   10/05/2019 Procedure   She underwent paracentesis.  IR removed 4100 cc of ascites fluid with significant blood noted.   10/05/2019 Imaging   CT abdomen and pelvis angiogram protocol show no obvious source of bleeding.   10/05/2019 Imaging   CT imaging of the abdomen and pelvis showed no evidence of postprocedural hemorrhage or discrete area of GI bleeding.  Large volume ascites is present.  Small pockets of intraperitoneal free air are present, likely related to recent paracentesis.  Abnormal mass in the pelvic region most likely a neoplasm of uterine or ovarian origin.  Normal liver with benign cysts of the right hepatic dome.  Biliary tree and pancreas and spleen were age-appropriate   10/07/2019 Procedure   Ultrasound paracentesis was performed.   10/07/2019 -  Chemotherapy   The patient had neoadjuvant carboplatin and taxol for chemotherapy treatment.     10/08/2019 Imaging   CT angiogram of the abdominal aorta and iliofemoral test was done showing 14 x 10 x 14 cm pelvic mass with mild to moderate ascites.  Normal appearance of arterial structures without evidence of compression.  No evidence of compression of the iliac vein or IVC.   10/11/2019 Imaging  Ultrasound shows gallbladder full of sludge.  Fatty liver changes.  Ascites present.   10/18/2019 Cancer Staging   Staging form: Ovary, Fallopian Tube, and Primary Peritoneal Carcinoma, AJCC 8th Edition - Clinical stage from 10/18/2019: FIGO Stage IIIC, calculated as Stage Unknown (cT3c, cNX, cM0) - Signed by Heath Lark, MD on 10/18/2019   10/21/2019 Tumor Marker   Patient's tumor was tested for the following markers: CA-125. Results of the tumor marker test  revealed 257.   10/31/2019 -  Chemotherapy   The patient had palonosetron (ALOXI) injection 0.25 mg, 0.25 mg, Intravenous,  Once, 2 of 6 cycles Administration: 0.25 mg (10/31/2019) CARBOplatin (PARAPLATIN) 620 mg in sodium chloride 0.9 % 250 mL chemo infusion, 620 mg (94.5 % of original dose 651.6 mg), Intravenous,  Once, 2 of 6 cycles Dose modification:   (original dose 651.6 mg, Cycle 1) Administration: 620 mg (10/31/2019) fosaprepitant (EMEND) 150 mg in sodium chloride 0.9 % 145 mL IVPB, 150 mg, Intravenous,  Once, 2 of 6 cycles Administration: 150 mg (10/31/2019) PACLitaxel (TAXOL) 300 mg in sodium chloride 0.9 % 250 mL chemo infusion (> 80mg /m2), 175 mg/m2 = 300 mg, Intravenous,  Once, 2 of 6 cycles Dose modification: 140 mg/m2 (80 % of original dose 175 mg/m2, Cycle 2, Reason: Dose Not Tolerated) Administration: 300 mg (10/31/2019)  for chemotherapy treatment.      REVIEW OF SYSTEMS:   Constitutional: Denies fevers, chills or abnormal weight loss Eyes: Denies blurriness of vision Ears, nose, mouth, throat, and face: Denies mucositis or sore throat Respiratory: Denies cough, dyspnea or wheezes Cardiovascular: Denies palpitation, chest discomfort or lower extremity swelling Gastrointestinal:  Denies nausea, heartburn or change in bowel habits Skin: Denies abnormal skin rashes Lymphatics: Denies new lymphadenopathy or easy bruising Behavioral/Psych: Mood is stable, no new changes  All other systems were reviewed with the patient and are negative.  I have reviewed the past medical history, past surgical history, social history and family history with the patient and they are unchanged from previous note.  ALLERGIES:  has No Known Allergies.  MEDICATIONS:  Current Outpatient Medications  Medication Sig Dispense Refill  . cetirizine (ZYRTEC) 10 MG chewable tablet Chew 10 mg by mouth daily. One tablet daily    . Cholecalciferol 1000 UNITS capsule Take 2,000 Units by mouth daily.     Marland Kitchen dexamethasone (DECADRON) 4 MG tablet Take 2 tabs at the night before and 2 tab the morning of chemotherapy, every 3 weeks, by mouth x 6 cycles (Patient not taking: Reported on 10/28/2019) 36 tablet 6  . ELIQUIS 5 MG TABS tablet Take 1 tablet (5 mg total) by mouth 2 (two) times daily. 60 tablet 11  . gabapentin (NEURONTIN) 300 MG capsule Take 900 mg by mouth 3 (three) times daily.    Marland Kitchen HYDROmorphone (DILAUDID) 4 MG tablet Take 1 tablet (4 mg total) by mouth every 4 (four) hours as needed for severe pain. (Patient not taking: Reported on 10/28/2019) 30 tablet 0  . ondansetron (ZOFRAN) 8 MG tablet Take by mouth every 8 (eight) hours as needed for nausea or vomiting. (Patient not taking: Reported on 10/28/2019)    . pantoprazole (PROTONIX) 40 MG tablet Take 1 tablet (40 mg total) by mouth daily. 90 tablet 1  . prochlorperazine (COMPAZINE) 10 MG tablet Take 1 tablet (10 mg total) by mouth every 6 (six) hours as needed (Nausea or vomiting). (Patient not taking: Reported on 10/28/2019) 30 tablet 1  . vitamin B-12 (CYANOCOBALAMIN) 1000 MCG tablet Take 1,000 mcg by  mouth daily.     No current facility-administered medications for this visit.   Facility-Administered Medications Ordered in Other Visits  Medication Dose Route Frequency Provider Last Rate Last Admin  . CARBOplatin (PARAPLATIN) 620 mg in sodium chloride 0.9 % 250 mL chemo infusion  620 mg Intravenous Once Alvy Bimler, Laloni Rowton, MD      . diphenhydrAMINE (BENADRYL) injection 12.5 mg  12.5 mg Intravenous Once Alvy Bimler, Lorely Bubb, MD      . famotidine (PEPCID) IVPB 20 mg premix  20 mg Intravenous Once Alvy Bimler, Likisha Alles, MD 200 mL/hr at 11/22/19 1309 20 mg at 11/22/19 1309  . PACLitaxel (TAXOL) 240 mg in sodium chloride 0.9 % 250 mL chemo infusion (> 80mg /m2)  140 mg/m2 (Treatment Plan Recorded) Intravenous Once Alvy Bimler, Rameses Ou, MD      . palonosetron (ALOXI) injection 0.25 mg  0.25 mg Intravenous Once Alvy Bimler, Adilenne Ashworth, MD        PHYSICAL EXAMINATION: ECOG PERFORMANCE  STATUS: 1 - Symptomatic but completely ambulatory  Vitals:   11/22/19 1132  BP: (!) 152/96  Pulse: 72  Resp: 16  Temp: 97.7 F (36.5 C)  SpO2: 99%   Filed Weights   11/22/19 1132  Weight: 145 lb 12.8 oz (66.1 kg)    GENERAL:alert, no distress and comfortable SKIN: skin color, texture, turgor are normal, no rashes or significant lesions EYES: normal, Conjunctiva are pink and non-injected, sclera clear OROPHARYNX:no exudate, no erythema and lips, buccal mucosa, and tongue normal  NECK: supple, thyroid normal size, non-tender, without nodularity LYMPH:  no palpable lymphadenopathy in the cervical, axillary or inguinal LUNGS: clear to auscultation and percussion with normal breathing effort HEART: regular rate & rhythm and no murmurs and no lower extremity edema ABDOMEN:abdomen soft, non-tender and normal bowel sounds Musculoskeletal:no cyanosis of digits and no clubbing  NEURO: alert & oriented x 3 with fluent speech, no focal motor/sensory deficits  LABORATORY DATA:  I have reviewed the data as listed    Component Value Date/Time   NA 138 11/22/2019 1115   NA 142 02/28/2019 1529   K 3.9 11/22/2019 1115   CL 105 11/22/2019 1115   CO2 28 11/22/2019 1115   GLUCOSE 112 (H) 11/22/2019 1115   BUN 17 11/22/2019 1115   BUN 14 02/28/2019 1529   CREATININE 0.80 11/22/2019 1115   CREATININE 0.79 05/28/2019 1156   CALCIUM 9.2 11/22/2019 1115   PROT 7.7 11/22/2019 1115   PROT 6.7 02/28/2019 1529   ALBUMIN 3.7 11/22/2019 1115   ALBUMIN 4.3 02/28/2019 1529   AST 15 11/22/2019 1115   ALT 12 11/22/2019 1115   ALKPHOS 86 11/22/2019 1115   BILITOT 0.4 11/22/2019 1115   BILITOT <0.2 02/28/2019 1529   GFRNONAA >60 11/22/2019 1115   GFRNONAA 81 05/28/2019 1156   GFRAA 94 05/28/2019 1156    No results found for: SPEP, UPEP  Lab Results  Component Value Date   WBC 6.7 11/22/2019   NEUTROABS 5.9 11/22/2019   HGB 8.1 (L) 11/22/2019   HCT 24.2 (L) 11/22/2019   MCV 90.0 11/22/2019    PLT 148 (L) 11/22/2019      Chemistry      Component Value Date/Time   NA 138 11/22/2019 1115   NA 142 02/28/2019 1529   K 3.9 11/22/2019 1115   CL 105 11/22/2019 1115   CO2 28 11/22/2019 1115   BUN 17 11/22/2019 1115   BUN 14 02/28/2019 1529   CREATININE 0.80 11/22/2019 1115   CREATININE 0.79 05/28/2019 1156  Component Value Date/Time   CALCIUM 9.2 11/22/2019 1115   ALKPHOS 86 11/22/2019 1115   AST 15 11/22/2019 1115   ALT 12 11/22/2019 1115   BILITOT 0.4 11/22/2019 1115   BILITOT <0.2 02/28/2019 1529

## 2019-11-22 NOTE — Assessment & Plan Note (Signed)
Shortness of breath has improved Her oxygen saturation is good I recommend discontinuation of oxygen She will continue anticoagulation therapy

## 2019-11-22 NOTE — Assessment & Plan Note (Signed)
she has mild peripheral neuropathy, likely related to side effects of treatment. °I plan to reduce the dose of treatment as outlined above.  °I explained to the patient the rationale of this strategy and reassured the patient it would not compromise the efficacy of treatment ° °

## 2019-11-22 NOTE — Progress Notes (Signed)
Met with patient at registration to introduce myself as Arboriculturist and to offer available resources.  Discussed one-time $1000 [employer] grant to assist with personal expenses while going through treatment.  Gave her my card if interested in applying and for any additional financial questions or concerns.

## 2019-11-22 NOTE — Telephone Encounter (Signed)
Rob (husband) called and said they were not able to schedule a CT at [facility] because they need insurance authorization first.  Called them and was able to schedule CT appointment for 10 am (first apt in the morning) on 11/11/19.  Called Rob back with appointment time and instructions.  He verbalized understanding and agreement.

## 2019-11-22 NOTE — Assessment & Plan Note (Signed)
She has pancytopenia from treatment She will return next week to get her blood count rechecked and if her hemoglobin is less than eight, we will give her 1 unit of blood

## 2019-11-22 NOTE — Assessment & Plan Note (Signed)
I reviewed plan of care with the patient and her husband She is recovering well from side effects of treatment Clinically, she have excellent response to therapy Her only complication are neuropathy and anemia I recommend mild dose adjustment today and for her to return next week to get her blood rechecked I plan to order CT imaging for objective assessment of response to therapy at the end of the month If she have good response to treatment, we can consider interval debulking surgery Otherwise, she will proceed with more chemotherapy

## 2019-11-23 ENCOUNTER — Telehealth: Payer: Self-pay

## 2019-11-23 LAB — CA 125: Cancer Antigen (CA) 125: 30.7 U/mL (ref 0.0–38.1)

## 2019-11-23 NOTE — Telephone Encounter (Signed)
-----   Message from Heath Lark, MD sent at 11/23/2019 10:04 AM EST ----- Regarding: pls let her know CA-125 is now normal!

## 2019-11-23 NOTE — Telephone Encounter (Signed)
Called and given below message. She verbalized understanding. 

## 2019-11-24 ENCOUNTER — Telehealth: Payer: Self-pay | Admitting: Oncology

## 2019-11-24 ENCOUNTER — Telehealth: Payer: Self-pay | Admitting: Hematology and Oncology

## 2019-11-24 ENCOUNTER — Encounter: Payer: Self-pay | Admitting: Oncology

## 2019-11-24 NOTE — Telephone Encounter (Signed)
Kristina Flores called and said she has been trying to have her oxygen equipment picked up by [employer].  Called [employer] at [phone] and they said an order was needed to discontinue home oxygen and then they can pick it up.  Faxed letter with order to [employer] at [phone].

## 2019-11-24 NOTE — Telephone Encounter (Signed)
Scheduled appointments per 11/9 sch msg. Spoke to patient who is aware of appointments dates and times.

## 2019-11-29 ENCOUNTER — Telehealth: Payer: Self-pay

## 2019-11-29 NOTE — Telephone Encounter (Signed)
I believe it is made a bit worse from chemo. I do not believe seeing back neurologist will help  I can address this on her next visit

## 2019-11-29 NOTE — Telephone Encounter (Signed)
Called and given below message. She verbalized understanding. She will wait and talk with Dr. Alvy Bimler at next visit.

## 2019-11-29 NOTE — Telephone Encounter (Signed)
She called and left a message. She was seeing a neurologist at [facility] for neuropathy for over 5 years. She is asking if Dr. Alvy Bimler thinks she should get appt to follow up regarding her toe neuropathy. She just wants to see what Dr. Alvy Bimler thinks.  She is using ice when she gets her treatment.

## 2019-11-30 ENCOUNTER — Other Ambulatory Visit: Payer: Self-pay

## 2019-11-30 ENCOUNTER — Inpatient Hospital Stay: Payer: 59

## 2019-11-30 DIAGNOSIS — D649 Anemia, unspecified: Secondary | ICD-10-CM

## 2019-11-30 DIAGNOSIS — I824Z3 Acute embolism and thrombosis of unspecified deep veins of distal lower extremity, bilateral: Secondary | ICD-10-CM

## 2019-11-30 DIAGNOSIS — R180 Malignant ascites: Secondary | ICD-10-CM

## 2019-11-30 DIAGNOSIS — C569 Malignant neoplasm of unspecified ovary: Secondary | ICD-10-CM | POA: Diagnosis not present

## 2019-11-30 DIAGNOSIS — Z5111 Encounter for antineoplastic chemotherapy: Secondary | ICD-10-CM | POA: Diagnosis not present

## 2019-11-30 DIAGNOSIS — I2699 Other pulmonary embolism without acute cor pulmonale: Secondary | ICD-10-CM

## 2019-11-30 DIAGNOSIS — D61818 Other pancytopenia: Secondary | ICD-10-CM

## 2019-11-30 DIAGNOSIS — R918 Other nonspecific abnormal finding of lung field: Secondary | ICD-10-CM

## 2019-11-30 LAB — COMPREHENSIVE METABOLIC PANEL
ALT: 29 U/L (ref 0–44)
AST: 25 U/L (ref 15–41)
Albumin: 3.5 g/dL (ref 3.5–5.0)
Alkaline Phosphatase: 81 U/L (ref 38–126)
Anion gap: 8 (ref 5–15)
BUN: 21 mg/dL (ref 8–23)
CO2: 29 mmol/L (ref 22–32)
Calcium: 9 mg/dL (ref 8.9–10.3)
Chloride: 104 mmol/L (ref 98–111)
Creatinine, Ser: 0.78 mg/dL (ref 0.44–1.00)
GFR, Estimated: >60 mL/min (ref >60)
Glucose, Bld: 93 mg/dL (ref 70–99)
Potassium: 4.5 mmol/L (ref 3.5–5.1)
Sodium: 141 mmol/L (ref 135–145)
Total Bilirubin: 0.2 mg/dL — ABNORMAL LOW (ref 0.3–1.2)
Total Protein: 7.3 g/dL (ref 6.5–8.1)

## 2019-11-30 LAB — CBC WITH DIFFERENTIAL/PLATELET
Abs Immature Granulocytes: 0 10*3/uL (ref 0.00–0.07)
Basophils Absolute: 0 10*3/uL (ref 0.0–0.1)
Basophils Relative: 0 %
Eosinophils Absolute: 0.1 10*3/uL (ref 0.0–0.5)
Eosinophils Relative: 4 %
HCT: 23.4 % — ABNORMAL LOW (ref 36.0–46.0)
Hemoglobin: 7.8 g/dL — ABNORMAL LOW (ref 12.0–15.0)
Immature Granulocytes: 0 %
Lymphocytes Relative: 52 %
Lymphs Abs: 1.3 10*3/uL (ref 0.7–4.0)
MCH: 31 pg (ref 26.0–34.0)
MCHC: 33.3 g/dL (ref 30.0–36.0)
MCV: 92.9 fL (ref 80.0–100.0)
Monocytes Absolute: 0.2 10*3/uL (ref 0.1–1.0)
Monocytes Relative: 6 %
Neutro Abs: 1 10*3/uL — ABNORMAL LOW (ref 1.7–7.7)
Neutrophils Relative %: 38 %
Platelets: 238 10*3/uL (ref 150–400)
RBC: 2.52 MIL/uL — ABNORMAL LOW (ref 3.87–5.11)
RDW: 17.6 % — ABNORMAL HIGH (ref 11.5–15.5)
WBC: 2.5 10*3/uL — ABNORMAL LOW (ref 4.0–10.5)
nRBC: 0 % (ref 0.0–0.2)

## 2019-11-30 LAB — SAMPLE TO BLOOD BANK

## 2019-11-30 LAB — PREPARE RBC (CROSSMATCH)

## 2019-11-30 MED ORDER — DIPHENHYDRAMINE HCL 25 MG PO CAPS
ORAL_CAPSULE | ORAL | Status: AC
  Filled 2019-11-30: qty 1

## 2019-11-30 MED ORDER — ACETAMINOPHEN 325 MG PO TABS
ORAL_TABLET | ORAL | Status: AC
  Filled 2019-11-30: qty 2

## 2019-11-30 MED ORDER — ACETAMINOPHEN 325 MG PO TABS
650.0000 mg | ORAL_TABLET | Freq: Once | ORAL | Status: AC
  Administered 2019-11-30: 650 mg via ORAL

## 2019-11-30 MED ORDER — SODIUM CHLORIDE 0.9% IV SOLUTION
250.0000 mL | Freq: Once | INTRAVENOUS | Status: AC
  Administered 2019-11-30: 250 mL via INTRAVENOUS
  Filled 2019-11-30: qty 250

## 2019-11-30 MED ORDER — DIPHENHYDRAMINE HCL 25 MG PO CAPS
25.0000 mg | ORAL_CAPSULE | Freq: Once | ORAL | Status: AC
  Administered 2019-11-30: 25 mg via ORAL

## 2019-11-30 NOTE — Patient Instructions (Signed)

## 2019-12-01 LAB — BPAM RBC
Blood Product Expiration Date: 202112232359
ISSUE DATE / TIME: 202111171059
Unit Type and Rh: 9500

## 2019-12-01 LAB — TYPE AND SCREEN
ABO/RH(D): O NEG
Antibody Screen: NEGATIVE
Unit division: 0

## 2019-12-01 NOTE — Telephone Encounter (Signed)
Himani called and said she hasn't heard anything from [employer].  They did call them and was advised to fax it to [phone].  Refaxed letter to the new number successfully. Encouraged Evey to call if she has heard anything by next week.

## 2019-12-05 ENCOUNTER — Other Ambulatory Visit: Payer: Self-pay

## 2019-12-05 ENCOUNTER — Ambulatory Visit: Payer: 59

## 2019-12-05 DIAGNOSIS — M6281 Muscle weakness (generalized): Secondary | ICD-10-CM | POA: Diagnosis not present

## 2019-12-05 DIAGNOSIS — C569 Malignant neoplasm of unspecified ovary: Secondary | ICD-10-CM

## 2019-12-05 NOTE — Patient Instructions (Signed)
Access Code: [email or web address] URL: [email or web address] Date: 12/05/2019 Prepared by: Cheral Almas  Exercises Sit to Stand without Arm Support - 1 x daily - 7 x weekly - 1 sets - 10 reps Single Leg Balance with Clock Reach - 1 x daily - 7 x weekly - 1 sets - 10 reps Supine Bridge - 1 x daily - 7 x weekly - 1 sets - 10 reps Supine Transversus Abdominis Bracing with Leg Extension - 1 x daily - 7 x weekly - 1 sets - 10 reps Supine March - 1 x daily - 7 x weekly - 1 sets - 10 reps Standing Shoulder Flexion to 90 Degrees - 1 x daily - 7 x weekly - 3 sets - 10 reps Shoulder Abduction - Thumbs Up - 1 x daily - 7 x weekly - 1 sets - 10 reps Hooklying Isometric Clamshell - 1 x daily - 7 x weekly - 1 sets - 15 reps

## 2019-12-05 NOTE — Therapy (Signed)
[place], [place], [address] Phone: [phone]   Fax:  [phone]  Physical Therapy Treatment  Patient Details  Name: Kristina Flores MRN: [id number] Date of Birth: 03-28-1960 Referring Provider (PT): Dr. Alvy Bimler   Encounter Date: 12/05/2019   PT End of Session - 12/05/19 1717    Visit Number 2    Number of Visits 17    Date for PT Re-Evaluation 01/11/20    PT Start Time 1503    PT Stop Time 1553    PT Time Calculation (min) 50 min    Activity Tolerance Patient tolerated treatment well    Behavior During Therapy [facility] for tasks assessed/performed           Past Medical History:  Diagnosis Date  . Allergy   . Cancer ([place])   . DVT (deep venous thrombosis) ([place])   . Heart murmur   . Numbness and tingling   . Osteopenia 08/2016   T score -1.6 FRAX 7.8%/0.7%  . Peripheral neuropathy, idiopathic   . Pulmonary embolism [facility])     Past Surgical History:  Procedure Laterality Date  . fibroid in 2011,2003    . miscarriage 1989    . ovarian cyst 1992      There were no vitals filed for this visit.   Subjective Assessment - 12/05/19 1409    Subjective Had a blood transfusion last week because I dipped below 8.  I feel much better now, but I know hemoglobin is still a little low. Have been sleeping well at night.  The neuropathy is still really bothering me, and I still have one toe that is healing. It oozes some but I think its healing well.  Left foot and ankle feels like it has gone to sleep since Friday.    Pertinent History Had some bloating before she went on trip and while in [name] developed blood clots in both legs and both lungs.  Found out then that she had Cancer.  Had the IVC filter placed and was hospitalized 09/29/19 - 10/15/19.  Started chemotherapy there and has had one more since being back.  Will continue chemo and eventually have surgery for Ovarian CA.  Is on chronic eliquis for  DVT. Had appt with Vascular and learned small vessels in toes were affected but are now healing. No longer having to use O2 at night or in the daytime.    How long can you walk comfortably? to the mailbox and back    Currently in Pain? No/denies    Pain Score 0-No pain    Pain Onset More than a month ago    Pain Frequency Intermittent                             [facility] Adult PT Treatment/Exercise - 12/05/19 0001      Lumbar Exercises: Supine   Pelvic Tilt 10 reps;5 seconds    Clam 15 reps    Clam Limitations --   yellow band   Bent Knee Raise 10 reps   with ppt   Bridge 10 reps    Straight Leg Raise 10 reps   with ppt, each leg     Knee/Hip Exercises: Standing   Hip Flexion Right;Left;1 set;10 reps    Hip Abduction Right;Left;1 set;10 reps    Hip Extension Left;1 set;10 reps    Lateral Step Up Right;Left;1 set;15 reps   6 in  Forward Step Up Right;Left;1 set;15 reps   6 in   Functional Squat 10 reps    Wall Squat 5 reps;10 seconds    SLS with Vectors 5 ea with touch    Other Standing Knee Exercises marching x10 ea    Other Standing Knee Exercises sit to stand x 12   no arms                   PT Short Term Goals - 11/16/19 1330      PT SHORT TERM GOAL #1   Title Pt will be independent in a HEP for generalized strength and conditioning.    Time 3    Period Weeks    Status New    Target Date 12/07/19      PT SHORT TERM GOAL #2   Title Pt will be able to walk to the mailbox and back 2-3 times without undue fatigue    Time 3    Period Weeks    Status New    Target Date 12/07/19             PT Long Term Goals - 11/16/19 1331      PT LONG TERM GOAL #1   Title Pt will be able to walk 10 min -20 minutes at her own pace without undue fatigue    Time 8    Period Weeks    Status New    Target Date 01/11/20      PT LONG TERM GOAL #2   Title Pt will be able to perform 12 repetions of sit to stand in 30 seconds to decrease fall risk     Baseline 8    Time 8    Period Weeks    Status New      PT LONG TERM GOAL #3   Title Pt will be able to maintain SLS on left leg for atleast 10 seconds without arm flailing for improved safety    Baseline 10 sec but continuous arm flailing    Time 8    Period Weeks    Status New      PT LONG TERM GOAL #4   Title pt. will be independant in a more advanced HEP and will be confident for release to HEP    Time 8    Period Weeks    Status New    Target Date 01/11/20                 Plan - 12/05/19 1720    Clinical Impression Statement Pt has not been seen in therapy for 3 weeks secondary to her infusion which makes her feel very bad for over a week and then she has decreased immunity.  She has about a week where she can have therapy and then she has the next round of treatments.  She is feeling much better after her blood transfusion last week, but knows that her hemoglobin is still low.  She had very good tolerance for exercises performed today, and could tell she had exercised but was not overly fatigued.    Personal Factors and Comorbidities Comorbidity 2    Comorbidities Ovarian CA, recent DVT's, PEs on chronic Eliquis    Examination-Activity Limitations Sleep;Squat;Locomotion Level    Stability/Clinical Decision Making Stable/Uncomplicated    Rehab Potential Good    PT Frequency 2x / week    PT Duration 8 weeks    PT Treatment/Interventions Therapeutic activities;Functional mobility training;Therapeutic exercise;Balance training;Neuromuscular re-education;Patient/family education;Energy conservation  PT Next Visit Plan generalized conditioning: AROM of arms, legs, Core, bike, theraband    Consulted and Agree with Plan of Care Patient           Patient will benefit from skilled therapeutic intervention in order to improve the following deficits and impairments:  Decreased balance, Decreased endurance, Difficulty walking, Impaired sensation, Decreased activity tolerance,  Decreased strength, Postural dysfunction, Pain  Visit Diagnosis: Malignant neoplasm of ovary, unspecified laterality (HCC)  Generalized muscle weakness     Problem List Patient Active Problem List   Diagnosis Date Noted  . Physical deconditioning 10/27/2019  . Cancer associated pain 10/20/2019  . Multiple lung nodules on CT 10/18/2019  . Acute pulmonary embolism ([place]) 10/18/2019  . Acute deep vein thrombosis of lower leg, bilateral ([place]) 10/18/2019  . S/P IVC filter 10/18/2019  . Pancytopenia, acquired ([place]) 10/18/2019  . 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 Ovarian cancer ([place]) 10/14/2019  . Plantar fasciitis of left foot 02/17/2017  . 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 Nonallopathic lesion of lumbosacral region 03/02/2012  . Nonallopathic lesion of cervical region 03/02/2012  . DDD (degenerative disc disease), lumbar 09/20/2011  . Menopause 09/20/2011  . Allergic rhinitis 09/20/2011    Claris Pong 12/05/2019, 5:24 PM  [place], [place], [address] Phone: [phone]   Fax:  [phone]  Name: Kristina Flores MRN: [id number] Date of Birth: 1960-03-17 Cheral Almas, PT 12/05/19 5:26 PM

## 2019-12-06 MED FILL — ELIQUIS 5 MG TABLET: 5 | 30 days supply | Qty: 60 | Fill #1

## 2019-12-07 ENCOUNTER — Other Ambulatory Visit: Payer: Self-pay

## 2019-12-07 ENCOUNTER — Ambulatory Visit: Payer: 59

## 2019-12-07 DIAGNOSIS — M6281 Muscle weakness (generalized): Secondary | ICD-10-CM

## 2019-12-07 DIAGNOSIS — C569 Malignant neoplasm of unspecified ovary: Secondary | ICD-10-CM | POA: Diagnosis not present

## 2019-12-07 NOTE — Therapy (Signed)
[place], [place], [address] Phone: [phone]   Fax:  [phone]  Physical Therapy Treatment  Patient Details  Name: Kristina Flores MRN: [id number] Date of Birth: Sep 03, 1960 Referring Provider (PT): Dr. Alvy Bimler   Encounter Date: 12/07/2019   PT End of Session - 12/07/19 1207    Visit Number 3    Number of Visits 17    Date for PT Re-Evaluation 01/11/20    PT Start Time 1100    PT Stop Time 1158    PT Time Calculation (min) 58 min    Activity Tolerance Patient tolerated treatment well    Behavior During Therapy [facility] for tasks assessed/performed           Past Medical History:  Diagnosis Date  . Allergy   . Cancer ([place])   . DVT (deep venous thrombosis) ([place])   . Heart murmur   . Numbness and tingling   . Osteopenia 08/2016   T score -1.6 FRAX 7.8%/0.7%  . Peripheral neuropathy, idiopathic   . Pulmonary embolism [facility])     Past Surgical History:  Procedure Laterality Date  . fibroid in 2011,2003    . miscarriage 1989    . ovarian cyst 1992      There were no vitals filed for this visit.   Subjective Assessment - 12/07/19 1100    Subjective I did well after last visit.  I wasn't really sore or or too fatigued after last visit.    Pertinent History Had some bloating before she went on trip and while in [name] developed blood clots in both legs and both lungs.  Found out then that she had Cancer.  Had the IVC filter placed and was hospitalized 09/29/19 - 10/15/19.  Started chemotherapy there and has had one more since being back.  Will continue chemo and eventually have surgery for Ovarian CA.  Is on chronic eliquis for DVT. Had appt with Vascular and learned small vessels in toes were affected but are now healing. No longer having to use O2 at night or in the daytime.    Currently in Pain? No/denies                             [facility] Adult PT Treatment/Exercise -  12/07/19 0001      Lumbar Exercises: Supine   Bent Knee Raise 20 reps   2 lb wt in hands to opposite knee   Bridge 10 reps   with abd   Straight Leg Raise 20 reps   with ppt, each leg with opposite hand.     Knee/Hip Exercises: Standing   Hip Flexion Right;Left;10 reps;1 set   yellow TBand   Hip Abduction Right;Left;1 set;10 reps   yellow TBand   Hip Extension Left;1 set;10 reps   yellow   Functional Squat 10 reps;2 sets   with chair behind   SLS with Vectors 8 ea with touch    Other Standing Knee Exercises marching 2 x10 ea   on ax     Shoulder Exercises: Standing   External Rotation Strengthening;Both;15 reps    Theraband Level (Shoulder External Rotation) Level 1 (Yellow)    Extension 15 reps    Theraband Level (Shoulder Extension) Level 2 (Red)    Retraction Both;15 reps    Theraband Level (Shoulder Retraction) Level 2 (Red)    Other Standing Exercises 3 D shoulder 1# 2 x 10  PT Education - 12/07/19 1205    Education Details Pt educated in 3 D hip with theraband, advanced 3 D shoulder with 1# wt, lateral band walks with band, Scap retraction, shoulder ext and vbilateral ER with yellow ban x 15.    Person(s) Educated Patient    Methods Demonstration;Handout;Explanation    Comprehension Verbalized understanding;Returned demonstration            PT Short Term Goals - 11/16/19 1330      PT SHORT TERM GOAL #1   Title Pt will be independent in a HEP for generalized strength and conditioning.    Time 3    Period Weeks    Status New    Target Date 12/07/19      PT SHORT TERM GOAL #2   Title Pt will be able to walk to the mailbox and back 2-3 times without undue fatigue    Time 3    Period Weeks    Status New    Target Date 12/07/19             PT Long Term Goals - 11/16/19 1331      PT LONG TERM GOAL #1   Title Pt will be able to walk 10 min -20 minutes at her own pace without undue fatigue    Time 8    Period Weeks    Status New     Target Date 01/11/20      PT LONG TERM GOAL #2   Title Pt will be able to perform 12 repetions of sit to stand in 30 seconds to decrease fall risk    Baseline 8    Time 8    Period Weeks    Status New      PT LONG TERM GOAL #3   Title Pt will be able to maintain SLS on left leg for atleast 10 seconds without arm flailing for improved safety    Baseline 10 sec but continuous arm flailing    Time 8    Period Weeks    Status New      PT LONG TERM GOAL #4   Title pt. will be independant in a more advanced HEP and will be confident for release to HEP    Time 8    Period Weeks    Status New    Target Date 01/11/20                 Plan - 12/07/19 1208    Clinical Impression Statement Pt was not challenged by activities last week so exercises were progressed today.  We added resistance to UE exs and LE exs and added airex pad for balance with marching. She did very well with all exs and felt like she had done something but was not over tired.  I reminded her that what we did today would not be appropriate when she has just had chemo and that she must listen to her body and not overdo    Personal Factors and Comorbidities Comorbidity 2    Comorbidities Ovarian CA, recent DVT's, PEs on chronic Eliquis    Examination-Activity Limitations Sleep;Squat;Locomotion Level    Stability/Clinical Decision Making Stable/Uncomplicated    Rehab Potential Good    PT Frequency 2x / week    PT Duration 8 weeks    PT Treatment/Interventions Therapeutic activities;Functional mobility training;Therapeutic exercise;Balance training;Neuromuscular re-education;Patient/family education;Energy conservation    PT Next Visit Plan generalized conditioning: AROM of arms, legs, Core, bike, theraband  Consulted and Agree with Plan of Care Patient           Patient will benefit from skilled therapeutic intervention in order to improve the following deficits and impairments:  Decreased balance, Decreased  endurance, Difficulty walking, Impaired sensation, Decreased activity tolerance, Decreased strength, Postural dysfunction, Pain  Visit Diagnosis: Malignant neoplasm of ovary, unspecified laterality (HCC)  Generalized muscle weakness     Problem List Patient Active Problem List   Diagnosis Date Noted  . Physical deconditioning 10/27/2019  . Cancer associated pain 10/20/2019  . Multiple lung nodules on CT 10/18/2019  . Acute pulmonary embolism ([place]) 10/18/2019  . Acute deep vein thrombosis of lower leg, bilateral ([place]) 10/18/2019  . S/P IVC filter 10/18/2019  . Pancytopenia, acquired ([place]) 10/18/2019  . 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 Ovarian cancer ([place]) 10/14/2019  . Plantar fasciitis of left foot 02/17/2017  . 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 Nonallopathic lesion of lumbosacral region 03/02/2012  . Nonallopathic lesion of cervical region 03/02/2012  . DDD (degenerative disc disease), lumbar 09/20/2011  . Menopause 09/20/2011  . Allergic rhinitis 09/20/2011    Claris Pong 12/07/2019, 12:14 PM  [place], [place], [address] Phone: [phone]   Fax:  [phone]  Name: Jhene Westmoreland MRN: [id number] Date of Birth: [birth year]  Cheral Almas, PT 12/07/19 12:15 PM

## 2019-12-07 NOTE — Patient Instructions (Signed)
Access Code: [email or web address] URL: [email or web address] Date: 12/07/2019 Prepared by: Cheral Almas  Exercises Side Stepping with Resistance at Feet - 1 x daily - 3-4 x weekly - 2 sets - 10 reps Bridge with Hip Abduction and Resistance - 1 x daily - 3-4 x weekly - 1-2 sets - 10 reps Hip Extension with Resistance Loop - 1 x daily - 3-4 x weekly - 1-2 sets - 10 reps Standing Hip Flexion with Resistance Loop - 1 x daily - 3-4 x weekly - 1-2 sets - 10 reps Standing Hip Abduction Kicks - 1 x daily - 3-4 x weekly - 1-2 sets - 10 reps Scapular Retraction with Resistance - 1 x daily - 3-4 x weekly - 1-2 sets - 10 reps Scapular Retraction with Resistance Advanced - 1 x daily - 3-4 x weekly - 1-2 sets - 10 reps Shoulder External Rotation and Scapular Retraction with Resistance - 1 x daily - 3-4 x weekly - 1-2 sets - 10 reps

## 2019-12-12 ENCOUNTER — Ambulatory Visit (INDEPENDENT_AMBULATORY_CARE_PROVIDER_SITE_OTHER): Payer: 59

## 2019-12-12 ENCOUNTER — Telehealth: Payer: Self-pay | Admitting: Hematology and Oncology

## 2019-12-12 ENCOUNTER — Inpatient Hospital Stay: Payer: 59

## 2019-12-12 ENCOUNTER — Inpatient Hospital Stay (HOSPITAL_BASED_OUTPATIENT_CLINIC_OR_DEPARTMENT_OTHER): Payer: 59 | Admitting: Hematology and Oncology

## 2019-12-12 ENCOUNTER — Other Ambulatory Visit: Payer: Self-pay

## 2019-12-12 DIAGNOSIS — D61818 Other pancytopenia: Secondary | ICD-10-CM

## 2019-12-12 DIAGNOSIS — R188 Other ascites: Secondary | ICD-10-CM | POA: Diagnosis not present

## 2019-12-12 DIAGNOSIS — I2699 Other pulmonary embolism without acute cor pulmonale: Secondary | ICD-10-CM

## 2019-12-12 DIAGNOSIS — G609 Hereditary and idiopathic neuropathy, unspecified: Secondary | ICD-10-CM | POA: Diagnosis not present

## 2019-12-12 DIAGNOSIS — Z5111 Encounter for antineoplastic chemotherapy: Secondary | ICD-10-CM | POA: Diagnosis not present

## 2019-12-12 DIAGNOSIS — N838 Other noninflammatory disorders of ovary, fallopian tube and broad ligament: Secondary | ICD-10-CM | POA: Diagnosis not present

## 2019-12-12 DIAGNOSIS — R918 Other nonspecific abnormal finding of lung field: Secondary | ICD-10-CM

## 2019-12-12 DIAGNOSIS — C561 Malignant neoplasm of right ovary: Secondary | ICD-10-CM | POA: Diagnosis not present

## 2019-12-12 DIAGNOSIS — C569 Malignant neoplasm of unspecified ovary: Secondary | ICD-10-CM

## 2019-12-12 DIAGNOSIS — J984 Other disorders of lung: Secondary | ICD-10-CM | POA: Diagnosis not present

## 2019-12-12 DIAGNOSIS — Z95828 Presence of other vascular implants and grafts: Secondary | ICD-10-CM | POA: Diagnosis not present

## 2019-12-12 DIAGNOSIS — I824Z3 Acute embolism and thrombosis of unspecified deep veins of distal lower extremity, bilateral: Secondary | ICD-10-CM

## 2019-12-12 DIAGNOSIS — R180 Malignant ascites: Secondary | ICD-10-CM

## 2019-12-12 LAB — CBC WITH DIFFERENTIAL/PLATELET
Abs Immature Granulocytes: 0.02 10*3/uL (ref 0.00–0.07)
Basophils Absolute: 0 10*3/uL (ref 0.0–0.1)
Basophils Relative: 0 %
Eosinophils Absolute: 0 10*3/uL (ref 0.0–0.5)
Eosinophils Relative: 1 %
HCT: 26.2 % — ABNORMAL LOW (ref 36.0–46.0)
Hemoglobin: 8.7 g/dL — ABNORMAL LOW (ref 12.0–15.0)
Immature Granulocytes: 1 %
Lymphocytes Relative: 36 %
Lymphs Abs: 1.3 10*3/uL (ref 0.7–4.0)
MCH: 31.2 pg (ref 26.0–34.0)
MCHC: 33.2 g/dL (ref 30.0–36.0)
MCV: 93.9 fL (ref 80.0–100.0)
Monocytes Absolute: 0.5 10*3/uL (ref 0.1–1.0)
Monocytes Relative: 13 %
Neutro Abs: 1.8 10*3/uL (ref 1.7–7.7)
Neutrophils Relative %: 49 %
Platelets: 111 10*3/uL — ABNORMAL LOW (ref 150–400)
RBC: 2.79 MIL/uL — ABNORMAL LOW (ref 3.87–5.11)
RDW: 17.8 % — ABNORMAL HIGH (ref 11.5–15.5)
WBC: 3.7 10*3/uL — ABNORMAL LOW (ref 4.0–10.5)
nRBC: 0 % (ref 0.0–0.2)

## 2019-12-12 LAB — COMPREHENSIVE METABOLIC PANEL
ALT: 16 U/L (ref 0–44)
AST: 19 U/L (ref 15–41)
Albumin: 3.5 g/dL (ref 3.5–5.0)
Alkaline Phosphatase: 71 U/L (ref 38–126)
Anion gap: 8 (ref 5–15)
BUN: 13 mg/dL (ref 8–23)
CO2: 28 mmol/L (ref 22–32)
Calcium: 9.2 mg/dL (ref 8.9–10.3)
Chloride: 103 mmol/L (ref 98–111)
Creatinine, Ser: 0.8 mg/dL (ref 0.44–1.00)
GFR, Estimated: >60 mL/min (ref >60)
Glucose, Bld: 106 mg/dL — ABNORMAL HIGH (ref 70–99)
Potassium: 3.6 mmol/L (ref 3.5–5.1)
Sodium: 139 mmol/L (ref 135–145)
Total Bilirubin: 0.3 mg/dL (ref 0.3–1.2)
Total Protein: 7.2 g/dL (ref 6.5–8.1)

## 2019-12-12 LAB — SAMPLE TO BLOOD BANK

## 2019-12-12 MED ORDER — IOHEXOL 300 MG/ML  SOLN
100.0000 mL | Freq: Once | INTRAMUSCULAR | Status: AC | PRN
  Administered 2019-12-12: 100 mL via INTRAVENOUS

## 2019-12-12 NOTE — Telephone Encounter (Signed)
Scheduled appts per 11/29 sch msg. Pt stated she would refer to mychart for AVS and appts.

## 2019-12-13 ENCOUNTER — Encounter: Payer: Self-pay | Admitting: Hematology and Oncology

## 2019-12-13 ENCOUNTER — Inpatient Hospital Stay: Payer: 59

## 2019-12-13 ENCOUNTER — Other Ambulatory Visit: Payer: Self-pay

## 2019-12-13 VITALS — BP 147/93 | HR 80 | Temp 97.8°F | Resp 18

## 2019-12-13 DIAGNOSIS — C569 Malignant neoplasm of unspecified ovary: Secondary | ICD-10-CM

## 2019-12-13 DIAGNOSIS — Z5111 Encounter for antineoplastic chemotherapy: Secondary | ICD-10-CM | POA: Diagnosis not present

## 2019-12-13 MED ORDER — SODIUM CHLORIDE 0.9 % IV SOLN
10.0000 mg | Freq: Once | INTRAVENOUS | Status: AC
  Administered 2019-12-13: 10 mg via INTRAVENOUS
  Filled 2019-12-13: qty 10

## 2019-12-13 MED ORDER — PALONOSETRON HCL INJECTION 0.25 MG/5ML
INTRAVENOUS | Status: AC
  Filled 2019-12-13: qty 5

## 2019-12-13 MED ORDER — SODIUM CHLORIDE 0.9 % IV SOLN
Freq: Once | INTRAVENOUS | Status: AC
  Filled 2019-12-13: qty 250

## 2019-12-13 MED ORDER — SODIUM CHLORIDE 0.9 % IV SOLN
150.0000 mg | Freq: Once | INTRAVENOUS | Status: AC
  Administered 2019-12-13: 150 mg via INTRAVENOUS
  Filled 2019-12-13: qty 150

## 2019-12-13 MED ORDER — SODIUM CHLORIDE 0.9 % IV SOLN
140.0000 mg/m2 | Freq: Once | INTRAVENOUS | Status: AC
  Administered 2019-12-13: 240 mg via INTRAVENOUS
  Filled 2019-12-13: qty 40

## 2019-12-13 MED ORDER — DIPHENHYDRAMINE HCL 50 MG/ML IJ SOLN
INTRAMUSCULAR | Status: AC
  Filled 2019-12-13: qty 1

## 2019-12-13 MED ORDER — FAMOTIDINE IN NACL 20-0.9 MG/50ML-% IV SOLN
20.0000 mg | Freq: Once | INTRAVENOUS | Status: AC
  Administered 2019-12-13: 20 mg via INTRAVENOUS

## 2019-12-13 MED ORDER — FAMOTIDINE IN NACL 20-0.9 MG/50ML-% IV SOLN
INTRAVENOUS | Status: AC
  Filled 2019-12-13: qty 50

## 2019-12-13 MED ORDER — SODIUM CHLORIDE 0.9 % IV SOLN
615.6000 mg | Freq: Once | INTRAVENOUS | Status: AC
  Administered 2019-12-13: 620 mg via INTRAVENOUS
  Filled 2019-12-13: qty 62

## 2019-12-13 MED ORDER — DIPHENHYDRAMINE HCL 50 MG/ML IJ SOLN
12.5000 mg | Freq: Once | INTRAMUSCULAR | Status: AC
  Administered 2019-12-13: 12.5 mg via INTRAVENOUS

## 2019-12-13 MED ORDER — PALONOSETRON HCL INJECTION 0.25 MG/5ML
0.2500 mg | Freq: Once | INTRAVENOUS | Status: AC
  Administered 2019-12-13: 0.25 mg via INTRAVENOUS

## 2019-12-13 NOTE — Assessment & Plan Note (Signed)
She has noted slight more progression of neuropathy at the bottom of her feet She will continue gabapentin as prescribed

## 2019-12-13 NOTE — Progress Notes (Signed)
[place] OFFICE PROGRESS NOTE  Patient Care Team: Rutherford Guys, MD (Inactive) as PCP - General (Family Medicine) Awanda Mink Craige Cotta, RN as Oncology Nurse Navigator (Oncology)  ASSESSMENT & PLAN:  Ovarian cancer [facility]) Overall, she have excellent response to therapy The bulk of the tumor has reduced in size from 14 cm measured in an outside facility to about 4-1/2 cm here Her tumor marker has normalized with her last cycle of treatment Clinically, she continues to improve I will discussed the case with GYN surgeon I think it would be appropriate to interrupt her chemotherapy next month and for her to undergo interval debulking surgery I would prefer for her to get closer to a month break from the cycle of treatment before surgery, very likely to be after Christmas or early next year I plan to see her back within 2 weeks of her surgery for blood count rechecked and consideration for IVC filter removal  Pancytopenia, acquired ([place]) She has profound pancytopenia, multifactorial due to side effects of treatment We will proceed with chemotherapy as scheduled She will return within 2 weeks for interim blood count check and transfusion as needed if her hemoglobin is less than 8 There is no contraindication for her to continue anticoagulation therapy as long as her platelet count is over 50,000  Acute pulmonary embolism (HCC) There is partial resolution of her clot burden on recent imaging She can proceed with surgery end of next month or early next year without bridging therapy  Idiopathic neuropathy She has noted slight more progression of neuropathy at the bottom of her feet She will continue gabapentin as prescribed  S/P IVC filter I plan to schedule IVC filter removal after surgery   No orders of the defined types were placed in this encounter.   All questions were answered. The patient knows to call the clinic with any problems, questions or concerns. The total  time spent in the appointment was 55 minutes encounter with patients including review of chart and various tests results, discussions about plan of care and coordination of care plan   Heath Lark, MD 12/13/2019 9:39 AM  INTERVAL HISTORY: Please see below for problem oriented charting. She returns with her husband for further follow-up and review of test results She has occasional bloating/constipation She is using laxatives No recent nausea Her peripheral edema has resolved She is eating well and gaining weight The patient denies any recent signs or symptoms of bleeding such as spontaneous epistaxis, hematuria or hematochezia. She has noted slight worsening neuropathy at the bottom of her feet She is more active  SUMMARY OF ONCOLOGIC HISTORY: Oncology History  Ovarian cancer ([place])  09/20/2019 Initial Diagnosis   Presented to her primary care doctor with abdominal bloating.  Abdominal x-ray imaging was unremarkable.   09/29/2019 Imaging   CT of the chest showed pulmonary embolism in all lobes.  Large right/central pelvic mass likely ovarian neoplasm with omental/peritoneal metastasis with significant free fluid in the abdomen and pelvis.  A small left uterine calcification could be seen in a degenerating fibroid.  Small right and left lung base nodules, could represent sequela of previous infection or inflammation with metastasis not excluded.   09/29/2019 - 10/15/2019 Hospital Admission   She presented to the emergency department in [name] after complaints of leg pain with bilateral lower extremity edema.  D-dimer was elevated.  Lower extremity ultrasound demonstrated extensive bilateral DVT involving peroneal and soleal calf veins on the left and peroneal vein on the right.  CT abdomen and pelvis showed large pelvic mass with omental metastasis and ascites, worrisome for ovarian cancer.  CT imaging of the chest showed bilateral pulmonary nodules as well as evidence of pulmonary embolism.  She  received anticoagulation therapy.  She was also found to be anemic, worrisome that she might have bleeding in the tumor and received transfusion.  Echocardiogram showed preserved ejection fraction but evidence of moderate right ventricular strain.  Subsequent biopsy showed cancer suspicious for ovarian primary and she received first cycle of neoadjuvant chemotherapy with carboplatin and taxol. She had IVC filter placement and temporary TPN   09/30/2019 Procedure   Ultrasound paracentesis was performed complicated by bleeding and received blood transfusion   09/30/2019 Procedure   She had IVC filter placement by interventional radiologist in [facility].   09/30/2019 Echocardiogram   Outside echocardiogram showed left ventricular ejection fraction around 79%.  Concentric left ventricular modeling.  Grade 1 diastolic pattern with normal left atrial pressure.  Right ventricle size is mildly enlarged.  Right ventricular systolic function is moderately reduced.  Right ventricle was not well visualized.  Aortic sclerosis without evidence of stenosis.  Moderate pleural effusion on the left region   10/02/2019 Procedure   EGD was performed which showed no evidence of acute bleeding.  Biopsy of the stomach showed benign gastric mucosa with reactive gastropathy as may be seen in the healing phase of erosive gastritis.  Helicobacter organism testing is negative..   10/03/2019 Procedure   She underwent CT-guided biopsy of the pelvic mass.   10/03/2019 Tumor Marker   CEA level was 2.9.   10/03/2019 Pathology Results   Biopsy show adenocarcinoma.  The malignant cells stain positive for keratin AE1/AE3, CK7 and MOC-31.  Rare cells show weak reactivity with p63 and GA TA 3.  Cells are negative for CK20, calretinin, CDX2, TTF-1, ER and WT-1.  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  Possible consideration include, but not limited to,  gynecological origin, pancreatico biliary and urothelial.   10/03/2019 Tumor Marker   Patient's tumor was tested for the following markers: CA-125 Results of the tumor marker test revealed 713 CA19-9 is 100   10/04/2019 Imaging   Tagged red blood cell scan equivocal for source of bleeding.   10/05/2019 Procedure   She underwent paracentesis.  IR removed 4100 cc of ascites fluid with significant blood noted.   10/05/2019 Imaging   CT abdomen and pelvis angiogram protocol show no obvious source of bleeding.   10/05/2019 Imaging   CT imaging of the abdomen and pelvis showed no evidence of postprocedural hemorrhage or discrete area of GI bleeding.  Large volume ascites is present.  Small pockets of intraperitoneal free air are present, likely related to recent paracentesis.  Abnormal mass in the pelvic region most likely a neoplasm of uterine or ovarian origin.  Normal liver with benign cysts of the right hepatic dome.  Biliary tree and pancreas and spleen were age-appropriate   10/07/2019 Procedure   Ultrasound paracentesis was performed.   10/07/2019 -  Chemotherapy   The patient had neoadjuvant carboplatin and taxol for chemotherapy treatment.     10/08/2019 Imaging   CT angiogram of the abdominal aorta and iliofemoral test was done showing 14 x 10 x 14 cm pelvic mass with mild to moderate ascites.  Normal appearance of arterial structures without evidence of compression.  No evidence of compression of the iliac vein or IVC.   10/11/2019 Imaging  Ultrasound shows gallbladder full of sludge.  Fatty liver changes.  Ascites present.   10/18/2019 Cancer Staging   Staging form: Ovary, Fallopian Tube, and Primary Peritoneal Carcinoma, AJCC 8th Edition - Clinical stage from 10/18/2019: FIGO Stage IIIC, calculated as Stage Unknown (cT3c, cNX, cM0) - Signed by Heath Lark, MD on 10/18/2019   10/21/2019 Tumor Marker   Patient's tumor was tested for the following markers: CA-125. Results of the tumor  marker test revealed 257.   10/31/2019 -  Chemotherapy   The patient had carboplatin and taxol for chemotherapy treatment.     11/22/2019 Tumor Marker   Patient's tumor was tested for the following markers: CA-125. Results of the tumor marker test revealed 30.7   12/12/2019 Imaging   1. Today's study demonstrates a positive response to therapy with regression of right ovarian lesion(s), and decreased volume of presumably malignant ascites. 2. No definitive evidence of metastatic disease in the thorax. Multiple tiny 2-4 mm pulmonary nodules are stable compared to prior examinations. Continued attention on follow-up studies is recommended to exclude the possibility of metastatic lesions      REVIEW OF SYSTEMS:   Constitutional: Denies fevers, chills or abnormal weight loss Eyes: Denies blurriness of vision Ears, nose, mouth, throat, and face: Denies mucositis or sore throat Respiratory: Denies cough, dyspnea or wheezes Cardiovascular: Denies palpitation, chest discomfort or lower extremity swelling Gastrointestinal:  Denies nausea, heartburn or change in bowel habits Skin: Denies abnormal skin rashes Lymphatics: Denies new lymphadenopathy or easy bruising Behavioral/Psych: Mood is stable, no new changes  All other systems were reviewed with the patient and are negative.  I have reviewed the past medical history, past surgical history, social history and family history with the patient and they are unchanged from previous note.  ALLERGIES:  has No Known Allergies.  MEDICATIONS:  Current Outpatient Medications  Medication Sig Dispense Refill  . cetirizine (ZYRTEC) 10 MG chewable tablet Chew 10 mg by mouth daily. One tablet daily    . Cholecalciferol 1000 UNITS capsule Take 2,000 Units by mouth daily.    Marland Kitchen dexamethasone (DECADRON) 4 MG tablet Take 2 tabs at the night before and 2 tab the morning of chemotherapy, every 3 weeks, by mouth x 6 cycles (Patient not taking: Reported on  10/28/2019) 36 tablet 6  . ELIQUIS 5 MG TABS tablet Take 1 tablet (5 mg total) by mouth 2 (two) times daily. 60 tablet 11  . gabapentin (NEURONTIN) 300 MG capsule Take 900 mg by mouth 3 (three) times daily.    Marland Kitchen HYDROmorphone (DILAUDID) 4 MG tablet Take 1 tablet (4 mg total) by mouth every 4 (four) hours as needed for severe pain. (Patient not taking: Reported on 10/28/2019) 30 tablet 0  . ondansetron (ZOFRAN) 8 MG tablet Take by mouth every 8 (eight) hours as needed for nausea or vomiting. (Patient not taking: Reported on 10/28/2019)    . pantoprazole (PROTONIX) 40 MG tablet Take 1 tablet (40 mg total) by mouth daily. 90 tablet 1  . prochlorperazine (COMPAZINE) 10 MG tablet Take 1 tablet (10 mg total) by mouth every 6 (six) hours as needed (Nausea or vomiting). (Patient not taking: Reported on 10/28/2019) 30 tablet 1  . vitamin B-12 (CYANOCOBALAMIN) 1000 MCG tablet Take 1,000 mcg by mouth daily.     No current facility-administered medications for this visit.   Facility-Administered Medications Ordered in Other Visits  Medication Dose Route Frequency Provider Last Rate Last Admin  . CARBOplatin (PARAPLATIN) 620 mg in sodium chloride 0.9 %  250 mL chemo infusion  620 mg Intravenous Once Alvy Bimler, Leafy Motsinger, MD      . famotidine (PEPCID) IVPB 20 mg premix  20 mg Intravenous Once Alvy Bimler, Jennel Mara, MD      . fosaprepitant (EMEND) 150 mg in sodium chloride 0.9 % 145 mL IVPB  150 mg Intravenous Once Heath Lark, MD 450 mL/hr at 12/13/19 0923 150 mg at 12/13/19 0923  . PACLitaxel (TAXOL) 240 mg in sodium chloride 0.9 % 250 mL chemo infusion (> 80mg /m2)  140 mg/m2 (Treatment Plan Recorded) Intravenous Once Heath Lark, MD        PHYSICAL EXAMINATION: ECOG PERFORMANCE STATUS: 1 - Symptomatic but completely ambulatory  Vitals:   12/12/19 1357  BP: 136/71  Pulse: 68  Resp: 16  Temp: (!) 97.4 F (36.3 C)  SpO2: 100%   Filed Weights   12/12/19 1357  Weight: 150 lb 6.4 oz (68.2 kg)    GENERAL:alert, no  distress and comfortable  Musculoskeletal:no cyanosis of digits and no clubbing  NEURO: alert & oriented x 3 with fluent speech, no focal motor/sensory deficits  LABORATORY DATA:  I have reviewed the data as listed    Component Value Date/Time   NA 139 12/12/2019 1342   NA 142 02/28/2019 1529   K 3.6 12/12/2019 1342   CL 103 12/12/2019 1342   CO2 28 12/12/2019 1342   GLUCOSE 106 (H) 12/12/2019 1342   BUN 13 12/12/2019 1342   BUN 14 02/28/2019 1529   CREATININE 0.80 12/12/2019 1342   CREATININE 0.79 05/28/2019 1156   CALCIUM 9.2 12/12/2019 1342   PROT 7.2 12/12/2019 1342   PROT 6.7 02/28/2019 1529   ALBUMIN 3.5 12/12/2019 1342   ALBUMIN 4.3 02/28/2019 1529   AST 19 12/12/2019 1342   ALT 16 12/12/2019 1342   ALKPHOS 71 12/12/2019 1342   BILITOT 0.3 12/12/2019 1342   BILITOT <0.2 02/28/2019 1529   GFRNONAA >60 12/12/2019 1342   GFRNONAA 81 05/28/2019 1156   GFRAA 94 05/28/2019 1156    No results found for: SPEP, UPEP  Lab Results  Component Value Date   WBC 3.7 (L) 12/12/2019   NEUTROABS 1.8 12/12/2019   HGB 8.7 (L) 12/12/2019   HCT 26.2 (L) 12/12/2019   MCV 93.9 12/12/2019   PLT 111 (L) 12/12/2019      Chemistry      Component Value Date/Time   NA 139 12/12/2019 1342   NA 142 02/28/2019 1529   K 3.6 12/12/2019 1342   CL 103 12/12/2019 1342   CO2 28 12/12/2019 1342   BUN 13 12/12/2019 1342   BUN 14 02/28/2019 1529   CREATININE 0.80 12/12/2019 1342   CREATININE 0.79 05/28/2019 1156      Component Value Date/Time   CALCIUM 9.2 12/12/2019 1342   ALKPHOS 71 12/12/2019 1342   AST 19 12/12/2019 1342   ALT 16 12/12/2019 1342   BILITOT 0.3 12/12/2019 1342   BILITOT <0.2 02/28/2019 1529       RADIOGRAPHIC STUDIES: I have reviewed all CT imaging with the patient and her husband I have personally reviewed the radiological images as listed and agreed with the findings in the report. CT CHEST ABDOMEN PELVIS W CONTRAST  Result Date: 12/12/2019 CLINICAL DATA:   59 year old female with history of ovarian cancer. Last chemotherapy 11/22/2019. Evaluate for treatment response. EXAM: CT CHEST, ABDOMEN, AND PELVIS WITH CONTRAST TECHNIQUE: Multidetector CT imaging of the chest, abdomen and pelvis was performed following the standard protocol during bolus administration of intravenous contrast. CONTRAST:  152mL OMNIPAQUE IOHEXOL 300 MG/ML  SOLN COMPARISON:  Outside chest CT 09/29/2019. Outside CT the abdomen and pelvis 10/05/2019. FINDINGS: CT CHEST FINDINGS Cardiovascular: Heart size is normal. There is no significant pericardial fluid, thickening or pericardial calcification. No atherosclerotic calcifications in the thoracic aorta or the coronary arteries. Mediastinum/Nodes: No pathologically enlarged mediastinal or hilar lymph nodes. Esophagus is unremarkable in appearance. No axillary lymphadenopathy. Lungs/Pleura: Multiple tiny 2-4 mm pulmonary nodules scattered throughout the lungs bilaterally, stable compared to the prior study. No other larger more suspicious appearing pulmonary nodules or masses are noted. No acute consolidative airspace disease. No pleural effusions. Mild linear scarring in the lung bases bilaterally. Musculoskeletal: There are no aggressive appearing lytic or blastic lesions noted in the visualized portions of the skeleton. CT ABDOMEN PELVIS FINDINGS Hepatobiliary: 11 mm low-attenuation lesion in segment 8 of the liver, compatible with a simple cyst. No other suspicious cystic or solid hepatic lesions. No intra or extrahepatic biliary ductal dilatation. Gallbladder is normal in appearance. Pancreas: No pancreatic mass. No pancreatic ductal dilatation. No pancreatic or peripancreatic fluid collections or inflammatory changes. Spleen: Unremarkable. Adrenals/Urinary Tract: Bilateral kidneys and adrenal glands are normal in appearance. No hydroureteronephrosis. Urinary bladder is normal in appearance. Stomach/Bowel: The appearance of the stomach is normal.  No pathologic dilatation of small bowel or colon. Appendix is not confidently identified may be surgically absent. Vascular/Lymphatic: No significant atherosclerotic disease, aneurysm or dissection noted in the abdominal or pelvic vasculature. IVC filter in place with tip terminating immediately below the level of the renal veins. No lymphadenopathy noted in the abdomen or pelvis. Reproductive: In the right ovary there is either 1 large complex lesion or 2 adjacent lesions that have slightly different imaging characteristics. Specifically, there is a solid appearing component (axial image 104 of series 2 and coronal image 70 of series 5) measuring 4.2 x 4.2 x 3.3 cm. Uterus and left ovary are unremarkable in appearance. Posteriorly to this (axial image 97 of series 2 and coronal image 82 of series 5) there is a 4.9 x 4.1 x 4.6 cm macrolobulated cystic lesion with multiple thick internal septations. Overall, the combined size of these lesions or this lesion appears decreased compared to the prior examination from 10/05/2019, indicating a positive response to therapy. Other: Small volume of ascites decreased compared to the prior examination. Haziness in the small bowel mesentery with small amount of interloop fluid. No pneumoperitoneum. Musculoskeletal: There are no aggressive appearing lytic or blastic lesions noted in the visualized portions of the skeleton. IMPRESSION: 1. Today's study demonstrates a positive response to therapy with regression of right ovarian lesion(s), and decreased volume of presumably malignant ascites. 2. No definitive evidence of metastatic disease in the thorax. Multiple tiny 2-4 mm pulmonary nodules are stable compared to prior examinations. Continued attention on follow-up studies is recommended to exclude the possibility of metastatic lesions. 3. Additional incidental findings, as above. Electronically Signed   By: Vinnie Langton M.D.   On: 12/12/2019 11:14

## 2019-12-13 NOTE — Patient Instructions (Signed)
   [facility] Discharge Instructions for Patients Receiving Chemotherapy  Today you received the following chemotherapy agents Taxol and Carboplatin   To help prevent nausea and vomiting after your treatment, we encourage you to take your nausea medication as directed.    If you develop nausea and vomiting that is not controlled by your nausea medication, call the clinic.   BELOW ARE SYMPTOMS THAT SHOULD BE REPORTED IMMEDIATELY:  *FEVER GREATER THAN 100.5 F  *CHILLS WITH OR WITHOUT FEVER  NAUSEA AND VOMITING THAT IS NOT CONTROLLED WITH YOUR NAUSEA MEDICATION  *UNUSUAL SHORTNESS OF BREATH  *UNUSUAL BRUISING OR BLEEDING  TENDERNESS IN MOUTH AND THROAT WITH OR WITHOUT PRESENCE OF ULCERS  *URINARY PROBLEMS  *BOWEL PROBLEMS  UNUSUAL RASH Items with * indicate a potential emergency and should be followed up as soon as possible.  Feel free to call the clinic should you have any questions or concerns. The clinic phone number is [phone].  Please show the CHEMO ALERT CARD at check-in to the Emergency Department and triage nurse.   

## 2019-12-13 NOTE — Progress Notes (Signed)
Pt stable at time of discharge. 

## 2019-12-13 NOTE — Assessment & Plan Note (Signed)
She has profound pancytopenia, multifactorial due to side effects of treatment We will proceed with chemotherapy as scheduled She will return within 2 weeks for interim blood count check and transfusion as needed if her hemoglobin is less than 8 There is no contraindication for her to continue anticoagulation therapy as long as her platelet count is over 50,000

## 2019-12-13 NOTE — Assessment & Plan Note (Signed)
There is partial resolution of her clot burden on recent imaging She can proceed with surgery end of next month or early next year without bridging therapy

## 2019-12-13 NOTE — Assessment & Plan Note (Signed)
Overall, she have excellent response to therapy The bulk of the tumor has reduced in size from 14 cm measured in an outside facility to about 4-1/2 cm here Her tumor marker has normalized with her last cycle of treatment Clinically, she continues to improve I will discussed the case with GYN surgeon I think it would be appropriate to interrupt her chemotherapy next month and for her to undergo interval debulking surgery I would prefer for her to get closer to a month break from the cycle of treatment before surgery, very likely to be after Christmas or early next year I plan to see her back within 2 weeks of her surgery for blood count rechecked and consideration for IVC filter removal

## 2019-12-13 NOTE — Assessment & Plan Note (Signed)
I plan to schedule IVC filter removal after surgery

## 2019-12-14 ENCOUNTER — Telehealth: Payer: Self-pay | Admitting: Oncology

## 2019-12-14 NOTE — Telephone Encounter (Signed)
Left a message for Kristina Flores with appointment to see Dr. Denman George on 12/19/19 at 2:30.  Requested a return call to confirm.

## 2019-12-19 ENCOUNTER — Other Ambulatory Visit: Payer: Self-pay

## 2019-12-19 ENCOUNTER — Other Ambulatory Visit: Payer: Self-pay | Admitting: Gynecologic Oncology

## 2019-12-19 ENCOUNTER — Inpatient Hospital Stay (HOSPITAL_BASED_OUTPATIENT_CLINIC_OR_DEPARTMENT_OTHER): Payer: 59 | Admitting: Gynecologic Oncology

## 2019-12-19 ENCOUNTER — Encounter: Payer: Self-pay | Admitting: Gynecologic Oncology

## 2019-12-19 VITALS — BP 132/71 | HR 75 | Temp 97.5°F | Resp 18 | Wt 149.0 lb

## 2019-12-19 DIAGNOSIS — Z79899 Other long term (current) drug therapy: Secondary | ICD-10-CM | POA: Insufficient documentation

## 2019-12-19 DIAGNOSIS — Z7901 Long term (current) use of anticoagulants: Secondary | ICD-10-CM | POA: Insufficient documentation

## 2019-12-19 DIAGNOSIS — M8580 Other specified disorders of bone density and structure, unspecified site: Secondary | ICD-10-CM | POA: Insufficient documentation

## 2019-12-19 DIAGNOSIS — C569 Malignant neoplasm of unspecified ovary: Secondary | ICD-10-CM

## 2019-12-19 DIAGNOSIS — C786 Secondary malignant neoplasm of retroperitoneum and peritoneum: Secondary | ICD-10-CM | POA: Insufficient documentation

## 2019-12-19 DIAGNOSIS — G629 Polyneuropathy, unspecified: Secondary | ICD-10-CM | POA: Insufficient documentation

## 2019-12-19 DIAGNOSIS — Z86711 Personal history of pulmonary embolism: Secondary | ICD-10-CM | POA: Insufficient documentation

## 2019-12-19 DIAGNOSIS — M6281 Muscle weakness (generalized): Secondary | ICD-10-CM | POA: Diagnosis not present

## 2019-12-19 DIAGNOSIS — Z9221 Personal history of antineoplastic chemotherapy: Secondary | ICD-10-CM | POA: Insufficient documentation

## 2019-12-19 DIAGNOSIS — R011 Cardiac murmur, unspecified: Secondary | ICD-10-CM | POA: Insufficient documentation

## 2019-12-19 MED ORDER — IBUPROFEN 600 MG PO TABS: 600.0000 mg | ORAL_TABLET | Freq: Four times a day (QID) | ORAL | 0 refills | Status: DC | PRN

## 2019-12-19 MED ORDER — NEOMYCIN SULFATE 500 MG PO TABS: ORAL_TABLET | ORAL | 0 refills | Status: DC

## 2019-12-19 MED ORDER — SENNOSIDES-DOCUSATE SODIUM 8.6-50 MG PO TABS: 2.0000 | ORAL_TABLET | Freq: Every day | ORAL | 0 refills | Status: DC

## 2019-12-19 MED ORDER — OXYCODONE HCL 5 MG PO TABS: 5.0000 mg | ORAL_TABLET | ORAL | 0 refills | Status: DC | PRN

## 2019-12-19 MED ORDER — ERYTHROMYCIN BASE 500 MG PO TABS: ORAL_TABLET | ORAL | 0 refills | Status: DC

## 2019-12-19 MED FILL — ERYTHROMYCIN BASE 500 MG TA: 500 | 1 days supply | Qty: 6 | Fill #0

## 2019-12-19 MED FILL — NEOMYCIN 500 MG TABLET: 500 | 1 days supply | Qty: 6 | Fill #0

## 2019-12-19 MED FILL — IBUPROFEN 600 MG TABLET: 600 | 8 days supply | Qty: 30 | Fill #0

## 2019-12-19 MED FILL — oxyCODONE HCL 5 MG TABS: 5 | 2 days supply | Qty: 10 | Fill #0

## 2019-12-19 NOTE — Progress Notes (Signed)
Follow-up Note: Gyn-Onc  Consult was requested by Dr. Gorsuch for the evaluation of Kristina Flores 59 y.o. female  CC:  Chief Complaint  Patient presents with  . Malignant neoplasm of ovary, unspecified laterality (HCC)    Assessment/Plan:  [name]  is a 59 y.o.  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 BRCA undetermined.  Partial good response to 3 cycles of neoadjuvant chemotherapy.   Based on her tumor marker, CT images and physical exam, I think she is a good candidate for interval cytoreduction. I believe that this can be attempted robotically in order to minimize the morbidity of surgery. I explained to Kristina Flores and her husband that the goal of surgical cytoreduction is to remove all visible or palpable disease.  This occasionally requires bowel resection, and given the close proximity of her right-sided mass with the sigmoid colon bowel wall, I am favoring administering a bowel prep for her preoperatively and we will be prepared for bowel resection should that be necessary.  If that proves to be necessary her surgery will need to be converted to laparotomy.  Additionally if any other unexpected findings are encountered during surgery will need to convert to laparotomy.  I explained that debulking surgeries with hysterectomy are a more morbid procedure then benign hysterectomies and are associated with particular surgical risks including bleeding, hemorrhage, blood transfusion, infection, damage to adjacent structures, urologic injury, reoperation, or extended postoperative recovery, VTE, and delayed resumption of chemotherapy to name a few.  We will hold her Eliquis for 4 days preoperatively and 7 days postoperatively after which time we will restart if there is no bleeding complication.  She has an IVC filter which remain in place until she is restarted on Eliquis.  I explained that this decreases  the incidence of pulmonary embolism but will not eliminate the risk of peripheral DVT.  I explained the importance of resuming chemotherapy postoperatively to eliminate residual microscopic disease.  I anticipate she will have a complete clinical response to her primary therapy given her excellent response thus far.  She will require BRCA germline testing, and I would recommend Lynch syndrome testing as well given her strong family history for colon cancer.  If she proves to be HRD or BRCA positive she will be a good candidate for olaparib maintenance therapy following chemotherapy.  HPI: [name] is a very pleasant 59 year old P3 who was seen in consultation at the request of Dr Gorsuch for evaluation of stage IIIc high-grade serous ovarian cancer.  The patient reported feeling vague symptoms of abdominal bloating and early satiety in late August and early September 2021.  She attributed this to some decreased activity due to her recent fractured toe.  She mentioned to her primary care physician who performed a pelvic exam which was unremarkable at that time and ordered a plain abdominal x-ray which showed stool burden but no other changes.  The patient subsequently departed for a planned trip to [place]  While out camping in [place] she developed severe shortness of breath and progressive abdominal distention and discomfort.  This caught her camping trip short and she was seen by local emergency department in [place] where she was admitted and CT scans were performed.  The CT scans showed pulmonary embolisms in all lobes.  A large right central pelvic mass which is likely an ovarian neoplasm   measuring at least 11 cm.  There were omental and peritoneal metastases with significant ascites.  A small left uterine calcification consistent with a fibroid was seen.  There was small right and left lung base nodules which could represent sequelae of previous  infection or possible metastases.  She was admitted and an IVC filter was placed on September 30, 2019.  Tumor markers on October 03, 2019 included a Ca1 25 which was elevated at 713.  CA 19-9 was elevated to 100.  CEA was normal at 2.9.  Paracentesis was performed on September 30, 2019 and showed adenocarcinoma. CT-guided biopsy of the pelvic mass on October 03, 2019 showed adenocarcinoma with malignant stains positive for keratin, CK7 and MOC-31.  There were negative for CK20 calretinin CDX2, ER, and WT 1.  The findings were consistent with a poorly differentiated adenocarcinoma of possible gynecologic primary.  Pancreas biliary urothelial primaries could also be considered.  She received her first cycle of carboplatin and paclitaxel as an inpatient on October 23, 2019.  She had been started on Eliquis for the DVT and PE.  An IVC filter had been placed as stated above.  She tolerated her first cycle of chemotherapy very well and had resolution of her abdominal symptoms of ascites.  She has mild constipation.  Energy levels are low.  However, she feels much better overall.  She was able to return back for [place] in mid October 2021.  Interval Hx:  She completed 4 cycles of chemotherapy on 12/13/19.  CA 125 on 11/16/19, which was day 1 of cycle 3, was normal at 30.  CT chest, abdomen and pelvis on 12/12/19 showed a solid mass in the right ovary measuring 4.2 x 4.2 x 3.3 cm. Uterus and left ovary are unremarkable in appearance. Posteriorly to this there was a 4.9 x 4.1 x 4.6 cm macrolobulated cystic lesion with multiple thick internal septations. Overall, the combined size of these lesions or this lesion appears decreased compared to the prior examination from 10/05/2019, indicating a positive response to therapy.No new lesions were seen. There was reduction in ascites present.   She is feeling very well and tolerating chemotherapy well with minimal toxicity.    Current  Meds:  Outpatient Encounter Medications as of 12/19/2019  Medication Sig  . cetirizine (ZYRTEC) 10 MG chewable tablet Chew 10 mg by mouth daily. One tablet daily  . Cholecalciferol 1000 UNITS capsule Take 2,000 Units by mouth daily.  . dexamethasone (DECADRON) 4 MG tablet Take 2 tabs at the night before and 2 tab the morning of chemotherapy, every 3 weeks, by mouth x 6 cycles  . ELIQUIS 5 MG TABS tablet Take 1 tablet (5 mg total) by mouth 2 (two) times daily.  . gabapentin (NEURONTIN) 300 MG capsule Take 900 mg by mouth 3 (three) times daily.  . pantoprazole (PROTONIX) 40 MG tablet Take 1 tablet (40 mg total) by mouth daily.  . vitamin B-12 (CYANOCOBALAMIN) 1000 MCG tablet Take 1,000 mcg by mouth daily.  . [START ON 01/09/2020] erythromycin base (E-MYCIN) 500 MG tablet Take 2 tablets (1000 mg total) at 2pm, 3pm, and 10 pm the day before surgery  . ibuprofen (ADVIL) 600 MG tablet Take 1 tablet (600 mg total) by mouth every 6 (six) hours as needed for moderate pain. For AFTER surgery only  . [START ON 01/09/2020] neomycin (MYCIFRADIN) 500 MG tablet Take 2 tablets (1000 mg total) at 2pm, 3pm, and 10 pm the day before surgery  .   ondansetron (ZOFRAN) 8 MG tablet Take by mouth every 8 (eight) hours as needed for nausea or vomiting. (Patient not taking: Reported on 10/28/2019)  . oxyCODONE (OXY IR/ROXICODONE) 5 MG immediate release tablet Take 1 tablet (5 mg total) by mouth every 4 (four) hours as needed for severe pain. For AFTER surgery, do not take and drive  . prochlorperazine (COMPAZINE) 10 MG tablet Take 1 tablet (10 mg total) by mouth every 6 (six) hours as needed (Nausea or vomiting). (Patient not taking: Reported on 10/28/2019)  . senna-docusate (SENOKOT-S) 8.6-50 MG tablet Take 2 tablets by mouth at bedtime. For AFTER surgery, do not take if having diarrhea  . [DISCONTINUED] HYDROmorphone (DILAUDID) 4 MG tablet Take 1 tablet (4 mg total) by mouth every 4 (four) hours as needed for severe pain.  (Patient not taking: Reported on 10/28/2019)   No facility-administered encounter medications on file as of 12/19/2019.    Allergy: No Known Allergies  Social Hx:   Social History   Socioeconomic History  . Marital status: Married    Spouse name: Robert  . Number of children: 3  . Years of education: College  . Highest education level: Not on file  Occupational History  . Occupation: PRESCHOOL TEACHER    Employer: [employer]  Tobacco Use  . Smoking status: Never Smoker  . Smokeless tobacco: Never Used  Vaping Use  . Vaping Use: Never used  Substance and Sexual Activity  . Alcohol use: Yes  . Drug use: No  . Sexual activity: Yes    Partners: Male    Birth control/protection: Post-menopausal  Other Topics Concern  . Not on file  Social History Narrative   Patient is married (Robert) and lives at home with her husband. Married x 36 years.   Patient has three adult children; 2 in [place] (30, 28); one in [place] (25).  No grandchildren in 2019..   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   Patient has a college education.   Patient is right-handed.   Patient drinks one cup of coffee daily.   Pillates three times per week for sixteen years.     Social Determinants of Health   Financial Resource Strain:   . Difficulty of Paying Living Expenses: Not on file  Food Insecurity:   . Worried About Running Out of Food in the Last Year: Not on file  . Ran Out of Food in the Last Year: Not on file  Transportation Needs:   . Lack of Transportation (Medical): Not on file  . Lack of Transportation (Non-Medical): Not on file  Physical Activity:   . Days of Exercise per Week: Not on file  . Minutes of Exercise per Session: Not on file  Stress:   . Feeling of Stress : Not on file  Social Connections:   . Frequency of Communication with Friends and Family: Not on file  . Frequency of Social Gatherings with Friends and Family: Not on file   . Attends Religious Services: Not on file  . Active Member of Clubs or Organizations: Not on file  . Attends Club or Organization Meetings: Not on file  . Marital Status: Not on file  Intimate Partner Violence:   . Fear of Current or Ex-Partner: Not on file  . Emotionally Abused: Not on file  . Physically Abused: Not on file  . Sexually Abused: Not on file    Past Surgical Hx:  Past Surgical History:  Procedure Laterality Date  .   fibroid in 2011,2003    . miscarriage 1989    . ovarian cyst 1992      Past Medical Hx:  Past Medical History:  Diagnosis Date  . Allergy   . Cancer (HCC)   . DVT (deep venous thrombosis) (HCC)   . Heart murmur   . Numbness and tingling   . Osteopenia 08/2016   T score -1.6 FRAX 7.8%/0.7%  . Peripheral neuropathy, idiopathic   . Pulmonary embolism (HCC)     Past Gynecological History:  SVD x 3 No LMP recorded. Patient is postmenopausal.  Family Hx:  Family History  Problem Relation Age of Onset  . Cancer Mother 36       leukemia  . Colon cancer Father 70  . Cancer Father        prostate cancer, colon cancer  . Arthritis Father   . Colon cancer Paternal Uncle   . Esophageal cancer Neg Hx   . Rectal cancer Neg Hx   . Stomach cancer Neg Hx     Review of Systems:  Constitutional  Feels fatigued ENT Normal appearing ears and nares bilaterally Skin/Breast  No rash, sores, jaundice, itching, dryness Cardiovascular  No chest pain, shortness of breath, or edema  Pulmonary  No cough or wheeze.  Gastro Intestinal  resolved bloating and early satiety, Genito Urinary  No frequency, urgency, dysuria, no bleeding Musculo Skeletal  No myalgia, arthralgia, joint swelling or pain  Neurologic  No weakness, numbness, change in gait,  Psychology  No depression, anxiety, insomnia.   Vitals:  Blood pressure 132/71, pulse 75, temperature (!) 97.5 F (36.4 C), temperature source Tympanic, resp. rate 18, weight 149 lb (67.6 kg), SpO2 100  %.  Physical Exam: WD in NAD Neck  Supple NROM, without any enlargements.  Lymph Node Survey No cervical supraclavicular or inguinal adenopathy Cardiovascular  Well perfused peripheries Lungs  No increased WOB Skin  No rash/lesions/breakdown  Psychiatry  Alert and oriented to person, place, and time  Abdomen  Normoactive bowel sounds, abdomen soft, non-tender and nonobese without evidence of hernia. Back No CVA tenderness Genito Urinary  Vulva/vagina: Normal external female genitalia.  No lesions. No discharge or bleeding.  Bladder/urethra:  No lesions or masses, well supported bladder  Vagina: smooth, no lesions  Cervix: Normal appearing, no lesions.  Uterus:  Small, mobile, no parametrial involvement or nodularity.  Adnexa: no longer palpable fullness in posterior cul de sac, No masses discretely appreciated.   Rectal  Good tone, no mass as previously palpated.  no cul de sac nodularity.  Extremities  No bilateral cyanosis, clubbing or edema.  40 minutes spent in this encounter with >50% in face to face communication and counseling.   Kristina Stacey C Maanasa Aderhold, MD  12/19/2019, 3:53 PM     

## 2019-12-19 NOTE — Patient Instructions (Signed)
Preparing for your Surgery  Plan for surgery on January 10, 2020 with Dr. Everitt Amber at [place] will be scheduled for a robotic assisted laparoscopic total hysterectomy (removal of uterus and cervix), bilateral salpingo-oophorectomy (removal of both ovaries and fallopian tubes), omentectomy (removal of omentum), tumor debulking, possible laparotomy (larger incision on your abdomen if needed), possible bowel resection.  YOU WILL NEED TO STOP YOUR ELIQUIS 4 DAYS BEFORE SURGERY (LAST DOSE ON January 05, 2020) AND FOR 7 DAYS AFTER SURGERY.   Pre-operative Testing -You will receive a phone call from presurgical testing at [facility] to arrange for a pre-operative appointment, lab appointment, and COVID test. The COVID test normally happens 3 days prior to the surgery and they ask that you self quarantine after the test up until surgery to decrease chance of exposure.  -Bring your insurance card, copy of an advanced directive if applicable, medication list  -At that visit, you will be asked to sign a consent for a possible blood transfusion in case a transfusion becomes necessary during surgery.  The need for a blood transfusion is rare but having consent is a necessary part of your care.   -Do not take supplements such as fish oil (omega 3), red yeast rice, turmeric before your surgery.   Day Before Surgery at [facility]. You will be advised you can have clear liquids up until 3 hours before your surgery.    If your bowels are filled with gas, your surgeon will have difficulty visualizing your pelvic organs which increases your surgical risks.  Your role in recovery Your role is to become active as soon as directed by your doctor, while still giving yourself time to heal.  Rest when you feel tired. You will be asked to do the following in order to speed your recovery:  - Cough and breathe deeply. This helps to clear and expand your lungs and can  prevent pneumonia after surgery.  - [place]. Do mild physical activity. Walking or moving your legs help your circulation and body functions return to normal. Do not try to get up or walk alone the first time after surgery.   -If you develop swelling on one leg or the other, pain in the back of your leg, redness/warmth in one of your legs, please call the office or go to the Emergency Room to have a doppler to rule out a blood clot. For shortness of breath, chest pain-seek care in the Emergency Room as soon as possible. - Actively manage your pain. Managing your pain lets you move in comfort. We will ask you to rate your pain on a scale of zero to 10. It is your responsibility to tell your doctor or nurse where and how much you hurt so your pain can be treated.  Special Considerations -If you are diabetic, you may be placed on insulin after surgery to have closer control over your blood sugars to promote healing and recovery.  This does not mean that you will be discharged on insulin.  If applicable, your oral antidiabetics will be resumed when you are tolerating a solid diet.  -Your final pathology results from surgery should be available around one week after surgery and the results will be relayed to you when available.  -Dr. Lahoma Crocker is the surgeon that assists your GYN Oncologist with surgery.  If you end up staying the night, the next day after your surgery you will either  see Dr. Denman George, Dr. Berline Lopes, or Dr. Lahoma Crocker.  -FMLA forms can be faxed to [phone] and please allow 5-7 business days for completion.  Pain Management After Surgery -You have been prescribed your pain medication and bowel regimen medications before surgery so that you can have these available when you are discharged from the hospital. The pain medication is for use ONLY AFTER surgery and a new prescription will not be given.   -Make sure that you have Tylenol and Ibuprofen at home  to use on a regular basis after surgery for pain control. We recommend alternating the medications every hour to six hours since they work differently and are processed in the body differently for pain relief.  -Review the attached handout on narcotic use and their risks and side effects.   Bowel Regimen -You have been prescribed Sennakot-S to take nightly to prevent constipation especially if you are taking the narcotic pain medication intermittently.  It is important to prevent constipation and drink adequate amounts of liquids. You can stop taking this medication when you are not taking pain medication and you are back on your normal bowel routine.  Risks of Surgery Risks of surgery are low but include bleeding, infection, damage to surrounding structures, re-operation, blood clots, and very rarely death.   Blood Transfusion Information (For the consent to be signed before surgery)  We will be checking your blood type before surgery so in case of emergencies, we will know what type of blood you would need.                                            WHAT IS A BLOOD TRANSFUSION?  A transfusion is the replacement of blood or some of its parts. Blood is made up of multiple cells which provide different functions.  Red blood cells carry oxygen and are used for blood loss replacement.  White blood cells fight against infection.  Platelets control bleeding.  Plasma helps clot blood.  Other blood products are available for specialized needs, such as hemophilia or other clotting disorders. BEFORE THE TRANSFUSION  Who gives blood for transfusions?   You may be able to donate blood to be used at a later date on yourself (autologous donation).  Relatives can be asked to donate blood. This is generally not any safer than if you have received blood from a stranger. The same precautions are taken to ensure safety when a relative's blood is donated.  Healthy volunteers who are fully evaluated  to make sure their blood is safe. This is blood bank blood. Transfusion therapy is the safest it has ever been in the practice of medicine. Before blood is taken from a donor, a complete history is taken to make sure that person has no history of diseases nor engages in risky social behavior (examples are intravenous drug use or sexual activity with multiple partners). The donor's travel history is screened to minimize risk of transmitting infections, such as malaria. The donated blood is tested for signs of infectious diseases, such as HIV and hepatitis. The blood is then tested to be sure it is compatible with you in order to minimize the chance of a transfusion reaction. If you or a relative donates blood, this is often done in anticipation of surgery and is not appropriate for emergency situations. It takes many days to process the donated blood. RISKS AND  COMPLICATIONS Although transfusion therapy is very safe and saves many lives, the main dangers of transfusion include:   Getting an infectious disease.  Developing a transfusion reaction. This is an allergic reaction to something in the blood you were given. Every precaution is taken to prevent this. The decision to have a blood transfusion has been considered carefully by your caregiver before blood is given. Blood is not given unless the benefits outweigh the risks.  AFTER SURGERY INSTRUCTIONS  Return to work: 4 weeks if applicable  Activity: 1. Be up and out of the bed during the day.  Take a nap if needed.  You may walk up steps but be careful and use the hand rail.  Stair climbing will tire you more than you think, you may need to stop part way and rest.   2. No lifting or straining for 6 weeks over 10 pounds. No pushing, pulling, straining for 6 weeks.  3. No driving for 1 week(s).  Do not drive if you are taking narcotic pain medicine and make sure that your reaction time has returned.   4. You can shower as soon as the next day  after surgery. Shower daily.  Use your regular soap and water (not directly on the incision) and pat your incision(s) dry afterwards; don't rub.  No tub baths or submerging your body in water until cleared by your surgeon. If you have the soap that was given to you by pre-surgical testing that was used before surgery, you do not need to use it afterwards because this can irritate your incisions.   5. No sexual activity and nothing in the vagina for 8 weeks.  6. You may experience a small amount of clear drainage from your incisions, which is normal.  If the drainage persists, increases, or changes color please call the office.  7. Do not use creams, lotions, or ointments such as neosporin on your incisions after surgery until advised by your surgeon because they can cause removal of the dermabond glue on your incisions.    8. You may experience vaginal spotting after surgery or around the 6-8 week mark from surgery when the stitches at the top of the vagina begin to dissolve.  The spotting is normal but if you experience heavy bleeding, call our office.  9. Take Tylenol or ibuprofen first for pain and only use narcotic pain medication for severe pain not relieved by the Tylenol or Ibuprofen.  Monitor your Tylenol intake to a max of 4,000 mg in a 24 hour period. You can alternate these medications after surgery.  Diet: 1. Low sodium Heart Healthy Diet is recommended but you are cleared to resume your normal (before surgery) diet after your procedure.  2. It is safe to use a laxative, such as Miralax or Colace, if you have difficulty moving your bowels. You have been prescribed Sennakot at bedtime every evening to keep bowel movements regular and to prevent constipation.    Wound Care: 1. Keep clean and dry.  Shower daily.  Reasons to call the Doctor:  Fever - Oral temperature greater than 100.4 degrees Fahrenheit  Foul-smelling vaginal discharge  Difficulty urinating  Nausea and  vomiting  Increased pain at the site of the incision that is unrelieved with pain medicine.  Difficulty breathing with or without chest pain  New calf pain especially if only on one side  Sudden, continuing increased vaginal bleeding with or without clots.   Contacts: For questions or concerns you should contact:  Dr. Everitt Amber at [phone]  Joylene John, NP at [phone]  After Hours: call [phone] and have the GYN Oncologist paged/contacted (after 5 pm or on the weekends)  [place] supplies for the bowel prep at a pharmacy of your choice: Office-e-mailed prescriptions for your antibiotic pills (Neomycin & Erythromycin)  2 bottles of magnesium citrate- no prescription required    Change your diet to make the bowel prep go more easily: Switch to a bland, low fiber diet Stop eating any nuts, popcorn, or fruit with seeds.  Stop all fiber supplements such as Metamucil, Miralax, etc.    Improve nutrition: Consider drinking 2-3 nutritional shakes (Ex: Ensure Surgery) every day, starting 5 days prior to surgery   DAY PRIOR TO SURGERY   Switch to a full liquid diet the day before surgery Drink plenty of liquids all day to avoid getting dehydrated    12:00pm Drink two bottles of magnesium citrate.   (You should finish in 2 hours)  2:00pm Take 2 Neomycin 500mg  tablets & 2 Erythromycin 500mg  tablets  3:00pm Take 2 Neomycin 500mg  tablets & 2 Erythromycin 500mg  tablets  Drink plenty of clear liquids all evening to avoid getting dehydrated  10:00pm Take 2 Neomycin 500mg  tablets & 2 Erythromycin 500mg  tablets  Drink 2 Carbohydrate loading nutrition drinks (ex: Ensure Presurgery). These will be given at pre-op appointment. Do not eat anything solid after bedtime (midnight) the night before your surgery.   BUT DO drink plenty of clear liquids (Water, Gatorade, juice, soda, coffee, tea, broths, etc.) up to 3 hours  prior to surgery to avoid getting dehydrated.    MORNING OF SURGERY  Remember to not to eat anything solid that morning  Drink one final carbohydrate loading nutritional drink (ex: Ensure Presurgery) upon waking up in the morning (needs to be 3 hours before your surgery). Hold or take medications as recommended by the hospital staff at your Preoperative visit Stop drinking liquids before you leave the house (>3 hours prior to surgery)    If you have questions or concerns, please call GYN Oncology Office at [phone] during business hours to speak to the clinical staff for advice.    WHAT DO I NEED TO KNOW ABOUT A FULL LIQUID DIET? -You may have any liquid. -You may have any food that becomes a liquid at room temperature. The food is considered a liquid if it can be poured off a spoon at room temperature. WHAT FOODS CAN I EAT? -Grains: Any grain food that can be pureed in soup (such as crackers, pasta, and rice). Hot cereal (such as farina or oatmeal) that has been blended.  -Fruits: Fruit juice, including nectars and juices. -Meats and Other Protein Sources: Eggs in custard, eggnog mix, and eggs used in ice cream or pudding. Strained meats, like in baby food, may be allowed. Consult your health care provider.  -Dairy: Milk and milk-based beverages, including milk shakes and instant breakfast mixes. Smooth yogurt. Pureed cottage cheese. Avoid these foods if they are not well tolerated. -Beverages: All beverages, including liquid nutritional supplements. AVOID CARBONATED BEVERAGES. -Condiments: Iodized salt, pepper, spices, and flavorings. Cocoa powder. Vinegar, ketchup, yellow mustard, smooth sauces (such as hollandaise, cheese sauce, or white sauce), and soy sauce. -Sweets and Desserts: Custard, smooth pudding. Flavored gelatin. Tapioca, junket. Plain ice cream, sherbet, fruit ices. Frozen ice pops, frozen fudge pops, pudding pops, and other frozen bars with cream. Syrups, including  chocolate syrup.  Sugar, honey, jelly.  -Fats and Oils: Margarine, butter, cream, sour cream, and oils. -Other: Broth and cream soups. Strained, broth-based soups. The items listed above may not be a complete list of recommended foods or beverages.  WHAT FOODS CAN I NOT EAT? Grains: All breads. Grains are not allowed unless they are pureed into soup. Vegetables: No raw vegetables. Vegetables are not allowed unless they are juiced, or cooked and pureed into soup. Fruits: Fruits are not allowed unless they are juiced. Meats and Other Protein Sources: Any meat or fish. Cooked or raw eggs. Nut butters.  Dairy: Cheese.  Condiments: Stone ground mustards. Fats and Oils: Fats that are coarse or chunky. Sweets and Desserts: Ice cream or other frozen desserts that have any solids in them or on top, such as nuts, chocolate chips, and pieces of cookies. Cakes. Cookies. Candy. Others: Soups with chunks or pieces in them.

## 2019-12-19 NOTE — H&P (View-Only) (Signed)
Follow-up Note: Gyn-Onc  Consult was requested by Dr. Alvy Bimler for the evaluation of Kristina Flores 59 y.o. female  CC:  Chief Complaint  Patient presents with  . Malignant neoplasm of ovary, unspecified laterality [facility])    Assessment/Plan:  Kristina Flores  is a 59 y.o.  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 BRCA undetermined.  Partial good response to 3 cycles of neoadjuvant chemotherapy.   Based on her tumor marker, CT images and physical exam, I think she is a good candidate for interval cytoreduction. I believe that this can be attempted robotically in order to minimize the morbidity of surgery. I explained to Kristina Flores and her husband that the goal of surgical cytoreduction is to remove all visible or palpable disease.  This occasionally requires bowel resection, and given the close proximity of her right-sided mass with the sigmoid colon bowel wall, I am favoring administering a bowel prep for her preoperatively and we will be prepared for bowel resection should that be necessary.  If that proves to be necessary her surgery will need to be converted to laparotomy.  Additionally if any other unexpected findings are encountered during surgery will need to convert to laparotomy.  I explained that debulking surgeries with hysterectomy are a more morbid procedure then benign hysterectomies and are associated with particular surgical risks including bleeding, hemorrhage, blood transfusion, infection, damage to adjacent structures, urologic injury, reoperation, or extended postoperative recovery, VTE, and delayed resumption of chemotherapy to name a few.  We will hold her Eliquis for 4 days preoperatively and 7 days postoperatively after which time we will restart if there is no bleeding complication.  She has an IVC filter which remain in place until she is restarted on Eliquis.  I explained that this decreases  the incidence of pulmonary embolism but will not eliminate the risk of peripheral DVT.  I explained the importance of resuming chemotherapy postoperatively to eliminate residual microscopic disease.  I anticipate she will have a complete clinical response to her primary therapy given her excellent response thus far.  She will require BRCA germline testing, and I would recommend Lynch syndrome testing as well given her strong family history for colon cancer.  If she proves to be HRD or BRCA positive she will be a good candidate for olaparib maintenance therapy following chemotherapy.  HPI: Kristina Flores is a very pleasant 59 year old P3 who was seen in consultation at the request of Dr Alvy Bimler for evaluation of stage IIIc high-grade serous ovarian cancer.  The patient reported feeling vague symptoms of abdominal bloating and early satiety in late August and early September 2021.  She attributed this to some decreased activity due to her recent fractured toe.  She mentioned to her primary care physician who performed a pelvic exam which was unremarkable at that time and ordered a plain abdominal x-ray which showed stool burden but no other changes.  The patient subsequently departed for a planned trip to [name] where she was [place].  While out camping in [place] she developed severe shortness of breath and progressive abdominal distention and discomfort.  This caught her camping trip short and she was seen by local emergency department in [name] where she was admitted and CT scans were performed.  The CT scans showed pulmonary embolisms in all lobes.  A large right central pelvic mass which is likely an ovarian neoplasm  measuring at least 11 cm.  There were omental and peritoneal metastases with significant ascites.  A small left uterine calcification consistent with a fibroid was seen.  There was small right and left lung base nodules which could represent sequelae of previous  infection or possible metastases.  She was admitted and an IVC filter was placed on September 30, 2019.  Tumor markers on October 03, 2019 included a Ca1 25 which was elevated at 713.  CA 19-9 was elevated to 100.  CEA was normal at 2.9.  Paracentesis was performed on September 30, 2019 and showed adenocarcinoma. CT-guided biopsy of the pelvic mass on October 03, 2019 showed adenocarcinoma with malignant stains positive for keratin, CK7 and MOC-31.  There were negative for CK20 calretinin CDX2, ER, and WT 1.  The findings were consistent with a poorly differentiated adenocarcinoma of possible gynecologic primary.  Pancreas biliary urothelial primaries could also be considered.  She received her first cycle of carboplatin and paclitaxel as an inpatient on October 23, 2019.  She had been started on Eliquis for the DVT and PE.  An IVC filter had been placed as stated above.  She tolerated her first cycle of chemotherapy very well and had resolution of her abdominal symptoms of ascites.  She has mild constipation.  Energy levels are low.  However, she feels much better overall.  She was able to return back for [place] mid October 2021.  Interval Hx:  She completed 4 cycles of chemotherapy on 12/13/19.  CA 125 on 11/16/19, which was day 1 of cycle 3, was normal at 30.  CT chest, abdomen and pelvis on 12/12/19 showed a solid mass in the right ovary measuring 4.2 x 4.2 x 3.3 cm. Uterus and left ovary are unremarkable in appearance. Posteriorly to this there was a 4.9 x 4.1 x 4.6 cm macrolobulated cystic lesion with multiple thick internal septations. Overall, the combined size of these lesions or this lesion appears decreased compared to the prior examination from 10/05/2019, indicating a positive response to therapy.No new lesions were seen. There was reduction in ascites present.   She is feeling very well and tolerating chemotherapy well with minimal toxicity.    Current  Meds:  Outpatient Encounter Medications as of 12/19/2019  Medication Sig  . cetirizine (ZYRTEC) 10 MG chewable tablet Chew 10 mg by mouth daily. One tablet daily  . Cholecalciferol 1000 UNITS capsule Take 2,000 Units by mouth daily.  Marland Kitchen dexamethasone (DECADRON) 4 MG tablet Take 2 tabs at the night before and 2 tab the morning of chemotherapy, every 3 weeks, by mouth x 6 cycles  . ELIQUIS 5 MG TABS tablet Take 1 tablet (5 mg total) by mouth 2 (two) times daily.  Marland Kitchen gabapentin (NEURONTIN) 300 MG capsule Take 900 mg by mouth 3 (three) times daily.  . pantoprazole (PROTONIX) 40 MG tablet Take 1 tablet (40 mg total) by mouth daily.  . vitamin B-12 (CYANOCOBALAMIN) 1000 MCG tablet Take 1,000 mcg by mouth daily.  Derrill Memo ON 01/09/2020] erythromycin base (E-MYCIN) 500 MG tablet Take 2 tablets (1000 mg total) at 2pm, 3pm, and 10 pm the day before surgery  . ibuprofen (ADVIL) 600 MG tablet Take 1 tablet (600 mg total) by mouth every 6 (six) hours as needed for moderate pain. For AFTER surgery only  . [START ON 01/09/2020] neomycin (MYCIFRADIN) 500 MG tablet Take 2 tablets (1000 mg total) at 2pm, 3pm, and 10 pm the day before surgery  .  ondansetron (ZOFRAN) 8 MG tablet Take by mouth every 8 (eight) hours as needed for nausea or vomiting. (Patient not taking: Reported on 10/28/2019)  . oxyCODONE (OXY IR/ROXICODONE) 5 MG immediate release tablet Take 1 tablet (5 mg total) by mouth every 4 (four) hours as needed for severe pain. For AFTER surgery, do not take and drive  . prochlorperazine (COMPAZINE) 10 MG tablet Take 1 tablet (10 mg total) by mouth every 6 (six) hours as needed (Nausea or vomiting). (Patient not taking: Reported on 10/28/2019)  . senna-docusate (SENOKOT-S) 8.6-50 MG tablet Take 2 tablets by mouth at bedtime. For AFTER surgery, do not take if having diarrhea  . [DISCONTINUED] HYDROmorphone (DILAUDID) 4 MG tablet Take 1 tablet (4 mg total) by mouth every 4 (four) hours as needed for severe pain.  (Patient not taking: Reported on 10/28/2019)   No facility-administered encounter medications on file as of 12/19/2019.    Allergy: No Known Allergies  Social Hx:   Social History   Socioeconomic History  . Marital status: Married    Spouse name: [name]  . Number of children: 3  . Years of education: College  . Highest education level: Not on file  Occupational History  . Occupation: PRESCHOOL Product manager: [employer]  Tobacco Use  . Smoking status: Never Smoker  . Smokeless tobacco: Never Used  Vaping Use  . Vaping Use: Never used  Substance and Sexual Activity  . Alcohol use: Yes  . Drug use: No  . Sexual activity: Yes    Partners: Male    Birth control/protection: Post-menopausal  Other Topics Concern  . Not on file  Social History Narrative   Patient is married [name]) and lives at home with her husband. Married x 36 years.   Patient has three adult children; 2 in [place] (30, 4); one in [place] (25).  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   Patient has a college education.   Patient is right-handed.   Patient drinks one cup of coffee daily.   Pillates three times per week for sixteen years.     Social Determinants of Health   Financial Resource Strain:   . Difficulty of Paying Living Expenses: Not on file  Food Insecurity:   . Worried About Charity fundraiser in the Last Year: Not on file  . Ran Out of Food in the Last Year: Not on file  Transportation Needs:   . Lack of Transportation (Medical): Not on file  . Lack of Transportation (Non-Medical): Not on file  Physical Activity:   . Days of Exercise per Week: Not on file  . Minutes of Exercise per Session: Not on file  Stress:   . Feeling of Stress : Not on file  Social Connections:   . Frequency of Communication with Friends and Family: Not on file  . Frequency of Social Gatherings with Friends and Family: Not on file   . Attends Religious Services: Not on file  . Active Member of Clubs or Organizations: Not on file  . Attends Archivist Meetings: Not on file  . Marital Status: Not on file  Intimate Partner Violence:   . Fear of Current or Ex-Partner: Not on file  . Emotionally Abused: Not on file  . Physically Abused: Not on file  . Sexually Abused: Not on file    Past Surgical Hx:  Past Surgical History:  Procedure Laterality Date  .  fibroid in 2011,2003    . miscarriage 1989    . ovarian cyst 1992      Past Medical Hx:  Past Medical History:  Diagnosis Date  . Allergy   . Cancer ([place])   . DVT (deep venous thrombosis) ([place])   . Heart murmur   . Numbness and tingling   . Osteopenia 08/2016   T score -1.6 FRAX 7.8%/0.7%  . Peripheral neuropathy, idiopathic   . Pulmonary embolism ([facility])     Past Gynecological History:  SVD x 3 No LMP recorded. Patient is postmenopausal.  Family Hx:  Family History  Problem Relation Age of Onset  . Cancer Mother 33       leukemia  . Colon cancer Father 77  . Cancer Father        prostate cancer, colon cancer  . Arthritis Father   . Colon cancer Paternal Uncle   . Esophageal cancer Neg Hx   . Rectal cancer Neg Hx   . Stomach cancer Neg Hx     Review of Systems:  Constitutional  Feels fatigued ENT Normal appearing ears and nares bilaterally Skin/Breast  No rash, sores, jaundice, itching, dryness Cardiovascular  No chest pain, shortness of breath, or edema  Pulmonary  No cough or wheeze.  Gastro Intestinal  resolved bloating and early satiety, Genito Urinary  No frequency, urgency, dysuria, no bleeding Musculo Skeletal  No myalgia, arthralgia, joint swelling or pain  Neurologic  No weakness, numbness, change in gait,  Psychology  No depression, anxiety, insomnia.   Vitals:  Blood pressure 132/71, pulse 75, temperature (!) 97.5 F (36.4 C), temperature source Tympanic, resp. rate 18, weight 149 lb (67.6 kg), SpO2 100  %.  Physical Exam: WD in NAD Neck  Supple NROM, without any enlargements.  Lymph Node Survey No cervical supraclavicular or inguinal adenopathy Cardiovascular  Well perfused peripheries Lungs  No increased WOB Skin  No rash/lesions/breakdown  Psychiatry  Alert and oriented to person, place, and time  Abdomen  Normoactive bowel sounds, abdomen soft, non-tender and nonobese without evidence of hernia. Back No CVA tenderness Genito Urinary  Vulva/vagina: Normal external female genitalia.  No lesions. No discharge or bleeding.  Bladder/urethra:  No lesions or masses, well supported bladder  Vagina: smooth, no lesions  Cervix: Normal appearing, no lesions.  Uterus:  Small, mobile, no parametrial involvement or nodularity.  Adnexa: no longer palpable fullness in posterior cul de sac, No masses discretely appreciated.   Rectal  Good tone, no mass as previously palpated.  no cul de sac nodularity.  Extremities  No bilateral cyanosis, clubbing or edema.  40 minutes spent in this encounter with >50% in face to face communication and counseling.   Thereasa Solo, MD  12/19/2019, 3:53 PM

## 2019-12-21 ENCOUNTER — Other Ambulatory Visit: Payer: Self-pay | Admitting: Gynecologic Oncology

## 2019-12-21 DIAGNOSIS — C569 Malignant neoplasm of unspecified ovary: Secondary | ICD-10-CM

## 2019-12-26 ENCOUNTER — Inpatient Hospital Stay: Payer: 59

## 2019-12-26 ENCOUNTER — Other Ambulatory Visit: Payer: Self-pay | Admitting: Hematology and Oncology

## 2019-12-26 ENCOUNTER — Other Ambulatory Visit: Payer: Self-pay

## 2019-12-26 ENCOUNTER — Ambulatory Visit: Payer: 59 | Attending: Hematology and Oncology

## 2019-12-26 DIAGNOSIS — M6281 Muscle weakness (generalized): Secondary | ICD-10-CM | POA: Diagnosis not present

## 2019-12-26 DIAGNOSIS — C569 Malignant neoplasm of unspecified ovary: Secondary | ICD-10-CM | POA: Diagnosis not present

## 2019-12-26 DIAGNOSIS — D61818 Other pancytopenia: Secondary | ICD-10-CM

## 2019-12-26 DIAGNOSIS — R180 Malignant ascites: Secondary | ICD-10-CM

## 2019-12-26 DIAGNOSIS — I2699 Other pulmonary embolism without acute cor pulmonale: Secondary | ICD-10-CM

## 2019-12-26 DIAGNOSIS — R918 Other nonspecific abnormal finding of lung field: Secondary | ICD-10-CM

## 2019-12-26 DIAGNOSIS — I824Z3 Acute embolism and thrombosis of unspecified deep veins of distal lower extremity, bilateral: Secondary | ICD-10-CM

## 2019-12-26 LAB — CBC WITH DIFFERENTIAL/PLATELET
Abs Immature Granulocytes: 0.01 10*3/uL (ref 0.00–0.07)
Basophils Absolute: 0 10*3/uL (ref 0.0–0.1)
Basophils Relative: 1 %
Eosinophils Absolute: 0 10*3/uL (ref 0.0–0.5)
Eosinophils Relative: 2 %
HCT: 23.4 % — ABNORMAL LOW (ref 36.0–46.0)
Hemoglobin: 7.9 g/dL — ABNORMAL LOW (ref 12.0–15.0)
Immature Granulocytes: 1 %
Lymphocytes Relative: 54 %
Lymphs Abs: 1.1 10*3/uL (ref 0.7–4.0)
MCH: 32 pg (ref 26.0–34.0)
MCHC: 33.8 g/dL (ref 30.0–36.0)
MCV: 94.7 fL (ref 80.0–100.0)
Monocytes Absolute: 0.3 10*3/uL (ref 0.1–1.0)
Monocytes Relative: 15 %
Neutro Abs: 0.5 10*3/uL — ABNORMAL LOW (ref 1.7–7.7)
Neutrophils Relative %: 27 %
Platelets: 164 10*3/uL (ref 150–400)
RBC: 2.47 MIL/uL — ABNORMAL LOW (ref 3.87–5.11)
RDW: 17.3 % — ABNORMAL HIGH (ref 11.5–15.5)
WBC: 1.9 10*3/uL — ABNORMAL LOW (ref 4.0–10.5)
nRBC: 0 % (ref 0.0–0.2)

## 2019-12-26 LAB — COMPREHENSIVE METABOLIC PANEL
ALT: 19 U/L (ref 0–44)
AST: 21 U/L (ref 15–41)
Albumin: 3.7 g/dL (ref 3.5–5.0)
Alkaline Phosphatase: 69 U/L (ref 38–126)
Anion gap: 6 (ref 5–15)
BUN: 19 mg/dL (ref 8–23)
CO2: 29 mmol/L (ref 22–32)
Calcium: 9.3 mg/dL (ref 8.9–10.3)
Chloride: 105 mmol/L (ref 98–111)
Creatinine, Ser: 0.94 mg/dL (ref 0.44–1.00)
GFR, Estimated: >60 mL/min (ref >60)
Glucose, Bld: 112 mg/dL — ABNORMAL HIGH (ref 70–99)
Potassium: 3.9 mmol/L (ref 3.5–5.1)
Sodium: 140 mmol/L (ref 135–145)
Total Bilirubin: 0.3 mg/dL (ref 0.3–1.2)
Total Protein: 7.3 g/dL (ref 6.5–8.1)

## 2019-12-26 LAB — SAMPLE TO BLOOD BANK

## 2019-12-26 LAB — PREPARE RBC (CROSSMATCH)

## 2019-12-26 MED ORDER — ACETAMINOPHEN 325 MG PO TABS
ORAL_TABLET | ORAL | Status: AC
  Filled 2019-12-26: qty 2

## 2019-12-26 MED ORDER — DIPHENHYDRAMINE HCL 25 MG PO CAPS
25.0000 mg | ORAL_CAPSULE | Freq: Once | ORAL | Status: AC
  Administered 2019-12-26: 25 mg via ORAL

## 2019-12-26 MED ORDER — ACETAMINOPHEN 325 MG PO TABS
650.0000 mg | ORAL_TABLET | Freq: Once | ORAL | Status: AC
  Administered 2019-12-26: 650 mg via ORAL

## 2019-12-26 MED ORDER — SODIUM CHLORIDE 0.9% IV SOLUTION
250.0000 mL | Freq: Once | INTRAVENOUS | Status: AC
  Administered 2019-12-26: 250 mL via INTRAVENOUS
  Filled 2019-12-26: qty 250

## 2019-12-26 MED ORDER — DIPHENHYDRAMINE HCL 25 MG PO CAPS
ORAL_CAPSULE | ORAL | Status: AC
  Filled 2019-12-26: qty 1

## 2019-12-26 NOTE — Patient Instructions (Signed)

## 2019-12-26 NOTE — Therapy (Signed)
[place], [place], [address] Phone: [phone]   Fax:  [phone]  Physical Therapy Treatment  Patient Details  Name: Kristina Flores MRN: [id number] Date of Birth: 1960/07/14 Referring Provider (PT): Dr. Alvy Bimler   Encounter Date: 12/26/2019   PT End of Session - 12/26/19 1226    Visit Number 4    Number of Visits 17    Date for PT Re-Evaluation 01/11/20    PT Start Time 1005    PT Stop Time 1053    PT Time Calculation (min) 48 min    Activity Tolerance Patient tolerated treatment well    Behavior During Therapy [facility] for tasks assessed/performed           Past Medical History:  Diagnosis Date  . Allergy   . Cancer ([place])   . DVT (deep venous thrombosis) ([place])   . Heart murmur   . Numbness and tingling   . Osteopenia 08/2016   T score -1.6 FRAX 7.8%/0.7%  . Peripheral neuropathy, idiopathic   . Pulmonary embolism [facility])     Past Surgical History:  Procedure Laterality Date  . fibroid in 2011,2003    . miscarriage 1989    . ovarian cyst 1992      There were no vitals filed for this visit.   Subjective Assessment - 12/26/19 1009    Subjective Doing really well.  last infusion was 2 weeks ago tomorrow.  I am a little tired today but I rode my bike yesterday 2 and a half miles slowly and carefully.  My toes are still preventing me from walking too far. Did well after last visit.  Hemoglobin was checked before chemo, and will check this afternoon.  My surgery is set for Jan 10, 2020    Currently in Pain? No/denies                             [facility] Adult PT Treatment/Exercise - 12/26/19 0001      Lumbar Exercises: Supine   Bent Knee Raise 20 reps   2 lb wt in hands to opposite knee   Bridge 10 reps   with abd   Straight Leg Raise 10 reps   with opposite hand, and 10 reps without hand   Straight Leg Raises Limitations had trouble maintaining ppt when lowering  while using opp arm.      Knee/Hip Exercises: Standing   Hip Flexion Stengthening;Right;Left;2 sets   yellow   Hip Abduction Right;Left;2 sets;10 reps   yellow   Hip Extension Stengthening;Right;Left;2 sets;10 reps   yellow   Functional Squat 10 reps;2 sets   to high table   SLS with Vectors 8 ea with touch    Other Standing Knee Exercises marching 2 x10 ea   on ax     Shoulder Exercises: Standing   External Rotation Strengthening;Both;20 reps    Theraband Level (Shoulder External Rotation) Level 2 (Red)    Extension Strengthening;Both;20 reps    Theraband Level (Shoulder Extension) Level 2 (Red)    Retraction Both;20 reps    Theraband Level (Shoulder Retraction) Level 2 (Red)    Other Standing Exercises 3 D shoulder 1# 2 x 10                    PT Short Term Goals - 11/16/19 1330      PT SHORT TERM GOAL #1   Title Pt will  be independent in a HEP for generalized strength and conditioning.    Time 3    Period Weeks    Status New    Target Date 12/07/19      PT SHORT TERM GOAL #2   Title Pt will be able to walk to the mailbox and back 2-3 times without undue fatigue    Time 3    Period Weeks    Status New    Target Date 12/07/19             PT Long Term Goals - 11/16/19 1331      PT LONG TERM GOAL #1   Title Pt will be able to walk 10 min -20 minutes at her own pace without undue fatigue    Time 8    Period Weeks    Status New    Target Date 01/11/20      PT LONG TERM GOAL #2   Title Pt will be able to perform 12 repetions of sit to stand in 30 seconds to decrease fall risk    Baseline 8    Time 8    Period Weeks    Status New      PT LONG TERM GOAL #3   Title Pt will be able to maintain SLS on left leg for atleast 10 seconds without arm flailing for improved safety    Baseline 10 sec but continuous arm flailing    Time 8    Period Weeks    Status New      PT LONG TERM GOAL #4   Title pt. will be independant in a more advanced HEP and will be  confident for release to HEP    Time 8    Period Weeks    Status New    Target Date 01/11/20                 Plan - 12/26/19 1227    Clinical Impression Statement Pt is feeling much better today and is excited to have her surgery scheduled for Dec. 28, 2021.  She did very well using good form with exercises today.  She did require VC's and demonstration for proper mini squat technique and did improve when done with a high table behind her.  She also required VC's for form with lumbar SLR with opposite hand as she was losing her core stab with lowering her leg and was feeling it in her LB.  She was able to correct technique with better activation of her core.  Pt may want to be released soon to HEP and may cancel Wed. appt.   Personal Factors and Comorbidities Comorbidity 2    Comorbidities Ovarian CA, recent DVT's, PEs on chronic Eliquis    Examination-Activity Limitations Sleep;Squat;Locomotion Level    Stability/Clinical Decision Making Stable/Uncomplicated    Rehab Potential Good    PT Frequency 2x / week    PT Duration 8 weeks    PT Treatment/Interventions Therapeutic activities;Functional mobility training;Therapeutic exercise;Balance training;Neuromuscular re-education;Patient/family education;Energy conservation    PT Next Visit Plan generalized conditioning: AROM of arms, legs, Core, bike, theraband, horizontal abd with TB, bird dog,diagonals with TB,etc    PT Home Exercise Plan HEP as instructed    Consulted and Agree with Plan of Care Patient           Patient will benefit from skilled therapeutic intervention in order to improve the following deficits and impairments:  Decreased balance,Decreased endurance,Difficulty walking,Impaired sensation,Decreased activity tolerance,Decreased strength,Postural dysfunction,Pain  Visit Diagnosis: Generalized muscle weakness  Malignant neoplasm of ovary, unspecified laterality [facility])     Problem List Patient Active Problem  List   Diagnosis Date Noted  . Physical deconditioning 10/27/2019  . Cancer associated pain 10/20/2019  . Multiple lung nodules on CT 10/18/2019  . Acute pulmonary embolism ([place]) 10/18/2019  . Acute deep vein thrombosis of lower leg, bilateral ([place]) 10/18/2019  . S/P IVC filter 10/18/2019  . Pancytopenia, acquired ([place]) 10/18/2019  . Ovarian cancer ([place]) 10/14/2019  . Plantar fasciitis of left foot 02/17/2017  . 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 Nonallopathic lesion of lumbosacral region 03/02/2012  . Nonallopathic lesion of cervical region 03/02/2012  . DDD (degenerative disc disease), lumbar 09/20/2011  . Menopause 09/20/2011  . Allergic rhinitis 09/20/2011    Claris Pong 12/26/2019, 12:33 PM  [place], [place], [address] Phone: [phone]   Fax:  [phone]  Name: Kristina Flores MRN: [id number] Date of Birth: 17-Mar-1960  Cheral Almas, PT 12/26/19 12:34 PM

## 2019-12-26 NOTE — Patient Instructions (Signed)
Pt with questions about pilates for cancer.  Given card for PT pilates on [address]

## 2019-12-27 LAB — TYPE AND SCREEN
ABO/RH(D): O NEG
Antibody Screen: NEGATIVE
Unit division: 0

## 2019-12-27 LAB — BPAM RBC
Blood Product Expiration Date: 202201102359
ISSUE DATE / TIME: 202112131503
Unit Type and Rh: 9500

## 2019-12-27 LAB — CA 125: Cancer Antigen (CA) 125: 16.4 U/mL (ref 0.0–38.1)

## 2019-12-28 NOTE — Patient Instructions (Signed)
DUE TO COVID-19 ONLY ONE VISITOR IS ALLOWED TO COME WITH YOU AND STAY IN THE WAITING ROOM ONLY DURING PRE OP AND PROCEDURE DAY OF SURGERY. THE 1 VISITOR  MAY VISIT WITH YOU AFTER SURGERY IN YOUR PRIVATE ROOM DURING VISITING HOURS ONLY!  YOU NEED TO HAVE A COVID 19 TEST ON__12-24-21_____ @_______ , THIS TEST MUST BE DONE BEFORE SURGERY,  COVID TESTING SITE [address] [place] [id number], IT IS ON THE RIGHT GOING OUT [address] APPROXIMATELY  2 MINUTES PAST [employer] ON THE RIGHT. ONCE YOUR COVID TEST IS COMPLETED,  PLEASE BEGIN THE QUARANTINE INSTRUCTIONS AS OUTLINED IN YOUR HANDOUT.                Kristina Flores  12/28/2019   Your procedure is scheduled on: 01-10-20   Report to [facility] Main  Entrance   Report to admitting at        0830  AM     Call this number if you have problems the morning of surgery [phone]    Remember: Do not eat food  :After Midnight. Follow Bowel prep per MD order  DRINK 2 PRESURGERY ENSURE DRINKS THE NIGHT BEFORE SURGERY AT  1000 PM AND 1 PRESURGERY DRINK THE DAY OF THE PROCEDURE 3 HOURS PRIOR TO SCHEDULED SURGERY. NO SOLIDS AFTER MIDNIGHT THE DAY PRIOR TO THE SURGERY. NOTHING BY MOUTH EXCEPT CLEAR LIQUIDS UNTIL THREE HOURS PRIOR TO SCHEDULED SURGERY. PLEASE FINISH PRESURGERY ENSURE DRINK PER SURGEON ORDER 3 HOURS PRIOR TO SCHEDULED SURGERY TIME WHICH NEEDS TO BE COMPLETED AT __0730 am then nothing by mouth_______.     CLEAR LIQUID DIET   Foods Allowed                                                                                           Foods Excluded  Black Coffee and tea, regular and decaf                                               liquids that you cannot  Plain Jell-O any favor except red or purple                                           see through such as: Fruit ices (not with fruit pulp)                                                     milk, soups, orange juice  Iced Popsicles  All solid food                                  Cranberry, grape and apple juices Sports drinks like Gatorade Lightly seasoned clear broth or consume(fat free) Sugar, honey syrup  Sample Menu Breakfast                                Lunch                                     Supper Cranberry juice                    Beef broth                            Chicken broth Jell-O                                     Grape juice                           Apple juice Coffee or tea                        Jell-O                                      Popsicle                                                Coffee or tea                        Coffee or tea  _____________________________________________________________________     BRUSH YOUR TEETH MORNING OF SURGERY AND RINSE YOUR MOUTH OUT, NO CHEWING GUM CANDY OR MINTS.     Take these medicines the morning of surgery with A SIP OF WATER: protonix, gabapentin, zyrtec                                 You may not have any metal on your body including hair pins and              piercings  Do not wear jewelry, make-up, lotions, powders or perfumes, deodorant             Do not wear nail polish on your fingernails.  Do not shave  48 hours prior to surgery.               Do not bring valuables to the hospital. Kristina Flores.  Contacts, dentures or bridgework may not be worn into surgery.      Patients discharged the day of surgery will not be allowed to drive  home. IF YOU ARE HAVING SURGERY AND GOING HOME THE SAME DAY, YOU MUST HAVE AN ADULT TO DRIVE YOU HOME AND BE WITH YOU FOR 24 HOURS. YOU MAY GO HOME BY TAXI OR UBER OR ORTHERWISE, BUT AN ADULT MUST ACCOMPANY YOU HOME AND STAY WITH YOU FOR 24 HOURS.  Name and phone number of your driver:  Special Instructions: N/A              Please read over the following fact sheets you were  given: _____________________________________________________________________             [facility] - Preparing for Surgery Before surgery, you can play an important role.  Because skin is not sterile, your skin needs to be as free of germs as possible.  You can reduce the number of germs on your skin by washing with CHG (chlorahexidine gluconate) soap before surgery.  CHG is an antiseptic cleaner which kills germs and bonds with the skin to continue killing germs even after washing. Please DO NOT use if you have an allergy to CHG or antibacterial soaps.  If your skin becomes reddened/irritated stop using the CHG and inform your nurse when you arrive at Short Stay. Do not shave (including legs and underarms) for at least 48 hours prior to the first CHG shower.  You may shave your face/neck. Please follow these instructions carefully:  1.  Shower with CHG Soap the night before surgery and the  morning of Surgery.  2.  If you choose to wash your hair, wash your hair first as usual with your  normal  shampoo.  3.  After you shampoo, rinse your hair and body thoroughly to remove the  shampoo.                           4.  Use CHG as you would any other liquid soap.  You can apply chg directly  to the skin and wash                       Gently with a scrungie or clean washcloth.  5.  Apply the CHG Soap to your body ONLY FROM THE NECK DOWN.   Do not use on face/ open                           Wound or open sores. Avoid contact with eyes, ears mouth and genitals (private parts).                       Wash face,  Genitals (private parts) with your normal soap.             6.  Wash thoroughly, paying special attention to the area where your surgery  will be performed.  7.  Thoroughly rinse your body with warm water from the neck down.  8.  DO NOT shower/wash with your normal soap after using and rinsing off  the CHG Soap.                9.  Pat yourself dry with a clean towel.            10.  Wear  clean pajamas.            11.  Place clean sheets on your bed the night of your first shower and do not  sleep with pets.  Day of Surgery : Do not apply any lotions/deodorants the morning of surgery.  Please wear clean clothes to the hospital/surgery center.  FAILURE TO FOLLOW THESE INSTRUCTIONS MAY RESULT IN THE CANCELLATION OF YOUR SURGERY PATIENT SIGNATURE_________________________________  NURSE SIGNATURE__________________________________  ________________________________________________________________________

## 2019-12-28 NOTE — Progress Notes (Addendum)
PCP - Marton Redwood, MD  Cardiologist - no   PPM/ICD -  Device Orders -  Rep Notified -   Chest x-ray -  EKG -  Stress Test -  ECHO -  Cardiac Cath -   Sleep Study -  CPAP -   Fasting Blood Sugar -  Checks Blood Sugar _____ times a day  Blood Thinner Instructions:Elequis hold 4 days prior  Aspirin Instructions:  ERAS Protcol -y PRE-SURGERY Ensure   COVID TEST- 01-06-20   Anesthesia review: DVT, PE, ovarian cancer, IVC filter, mumur benign  , Hgb 9.6 had transfusion  12-28-19  Patient denies shortness of breath, fever, cough and chest pain at PAT appointment   none   All instructions explained to the patient, with a verbal understanding of the material. Patient agrees to go over the instructions while at home for a better understanding. Patient also instructed to self quarantine after being tested for COVID-19. The opportunity to ask questions was provided.

## 2019-12-29 ENCOUNTER — Telehealth: Payer: Self-pay

## 2019-12-29 ENCOUNTER — Other Ambulatory Visit: Payer: Self-pay

## 2019-12-29 ENCOUNTER — Encounter (HOSPITAL_COMMUNITY): Payer: Self-pay

## 2019-12-29 ENCOUNTER — Encounter (HOSPITAL_COMMUNITY)
Admission: RE | Admit: 2019-12-29 | Discharge: 2019-12-29 | Disposition: A | Discharge status: Home / Self Care | Payer: 59 | Source: Ambulatory Visit | Attending: Gynecologic Oncology | Admitting: Gynecologic Oncology

## 2019-12-29 DIAGNOSIS — Z01812 Encounter for preprocedural laboratory examination: Secondary | ICD-10-CM | POA: Insufficient documentation

## 2019-12-29 HISTORY — DX: Anemia, unspecified: D64.9

## 2019-12-29 LAB — CBC
HCT: 28.9 % — ABNORMAL LOW (ref 36.0–46.0)
Hemoglobin: 9.6 g/dL — ABNORMAL LOW (ref 12.0–15.0)
MCH: 32 pg (ref 26.0–34.0)
MCHC: 33.2 g/dL (ref 30.0–36.0)
MCV: 96.3 fL (ref 80.0–100.0)
Platelets: 128 10*3/uL — ABNORMAL LOW (ref 150–400)
RBC: 3 MIL/uL — ABNORMAL LOW (ref 3.87–5.11)
RDW: 17.2 % — ABNORMAL HIGH (ref 11.5–15.5)
WBC: 2 10*3/uL — ABNORMAL LOW (ref 4.0–10.5)
nRBC: 0 % (ref 0.0–0.2)

## 2019-12-29 LAB — URINALYSIS, ROUTINE W REFLEX MICROSCOPIC
Bacteria, UA: NONE SEEN
Bilirubin Urine: NEGATIVE
Glucose, UA: NEGATIVE mg/dL
Hgb urine dipstick: NEGATIVE
Ketones, ur: NEGATIVE mg/dL
Leukocytes,Ua: NEGATIVE
Nitrite: NEGATIVE
Protein, ur: NEGATIVE mg/dL
Specific Gravity, Urine: 1.003 — ABNORMAL LOW (ref 1.005–1.030)
pH: 5 (ref 5.0–8.0)

## 2019-12-29 LAB — BASIC METABOLIC PANEL
Anion gap: 10 (ref 5–15)
BUN: 25 mg/dL — ABNORMAL HIGH (ref 8–23)
CO2: 26 mmol/L (ref 22–32)
Calcium: 9.1 mg/dL (ref 8.9–10.3)
Chloride: 103 mmol/L (ref 98–111)
Creatinine, Ser: 0.81 mg/dL (ref 0.44–1.00)
GFR, Estimated: >60 mL/min (ref >60)
Glucose, Bld: 96 mg/dL (ref 70–99)
Potassium: 4.5 mmol/L (ref 3.5–5.1)
Sodium: 139 mmol/L (ref 135–145)

## 2019-12-29 NOTE — Telephone Encounter (Signed)
TC from patient inquiring about travel after surgery from January 6-9.  Patient plans to take a 2 hour road trip to [facility] in [place].  Patient understands there is not a local or nearby medical facility.  Per Joylene John, NP, patient may travel as long as she gets out of car and walks at least every hour.  Patient understands this is to prevent blood clots. Patient understands she will resume her Eliquis 7 days post op.

## 2019-12-30 LAB — CA 125: Cancer Antigen (CA) 125: 18.3 U/mL (ref 0.0–38.1)

## 2020-01-05 ENCOUNTER — Telehealth: Payer: Self-pay | Admitting: Hematology and Oncology

## 2020-01-05 ENCOUNTER — Telehealth: Payer: Self-pay

## 2020-01-05 NOTE — Telephone Encounter (Signed)
Release: [id number] Faxed medical records to [place] @ fax [phone]

## 2020-01-05 NOTE — Telephone Encounter (Signed)
Reviewed the low fiber diet with Kristina Flores as noted in instructions for the bowel prep. Also reviewed the holding her Eliquis as noted below by Joylene John, NP: YOU WILL NEED TO STOP YOUR ELIQUIS 4 DAYS BEFORE SURGERY (LAST DOSE ON January 05, 2020) AND FOR 7 DAYS AFTER SURGERY.  She can resume the Eliquis on 01-18-20 if not bleeding issues post operatively. Pt verbalized understanding.

## 2020-01-09 ENCOUNTER — Telehealth: Payer: Self-pay

## 2020-01-09 ENCOUNTER — Other Ambulatory Visit (HOSPITAL_COMMUNITY)
Admission: RE | Admit: 2020-01-09 | Discharge: 2020-01-09 | Disposition: A | Discharge status: Home / Self Care | Payer: 59 | Source: Ambulatory Visit | Attending: Gynecologic Oncology | Admitting: Gynecologic Oncology

## 2020-01-09 DIAGNOSIS — Z01812 Encounter for preprocedural laboratory examination: Secondary | ICD-10-CM | POA: Insufficient documentation

## 2020-01-09 DIAGNOSIS — Z20822 Contact with and (suspected) exposure to covid-19: Secondary | ICD-10-CM | POA: Insufficient documentation

## 2020-01-09 LAB — SARS CORONAVIRUS 2 (TAT 6-24 HRS): SARS Coronavirus 2: NEGATIVE

## 2020-01-09 MED ORDER — SODIUM CHLORIDE 0.9 % IV SOLN
2.0000 g | INTRAVENOUS | Status: AC
  Administered 2020-01-10 (×2): 2 g via INTRAVENOUS
  Filled 2020-01-09: qty 2

## 2020-01-09 NOTE — Telephone Encounter (Signed)
TC to patient.  Ms. Canter stated she understands all preoperative instructions given to her and knows her arrival time has been changed to 0515am. She understands she is NPO after 0430am per preadmission note.

## 2020-01-09 NOTE — Anesthesia Preprocedure Evaluation (Addendum)
Anesthesia Evaluation  Patient identified by MRN, date of birth, ID band Patient awake    Reviewed: Allergy & Precautions, NPO status , Patient's Chart, lab work & pertinent test results  History of Anesthesia Complications Negative for: history of anesthetic complications  Airway Mallampati: II  TM Distance: >3 FB Neck ROM: Full    Dental  (+) Dental Advisory Given   Pulmonary PE 01/09/2020 SARS coronavirus NEG   breath sounds clear to auscultation       Cardiovascular (-) angina+ DVT (IVC filter)   Rhythm:Regular Rate:Normal     Neuro/Psych negative neurological ROS     GI/Hepatic Neg liver ROS, GERD  Medicated and Controlled,  Endo/Other  negative endocrine ROS  Renal/GU negative Renal ROS   Ovarian cancer    Musculoskeletal  (+) Arthritis ,   Abdominal   Peds  Hematology  (+) Blood dyscrasia (Hb 9.6, plt 128k), anemia , eliquis   Anesthesia Other Findings Ovarian cancer  Reproductive/Obstetrics                            Anesthesia Physical Anesthesia Plan  ASA: II  Anesthesia Plan: General   Post-op Pain Management:    Induction: Intravenous  PONV Risk Score and Plan: 3 and Ondansetron, Dexamethasone and Scopolamine patch - Pre-op  Airway Management Planned: Oral ETT  Additional Equipment: None  Intra-op Plan:   Post-operative Plan: Extubation in OR  Informed Consent: I have reviewed the patients History and Physical, chart, labs and discussed the procedure including the risks, benefits and alternatives for the proposed anesthesia with the patient or authorized representative who has indicated his/her understanding and acceptance.     Dental advisory given  Plan Discussed with: CRNA and Surgeon  Anesthesia Plan Comments:        Anesthesia Quick Evaluation

## 2020-01-09 NOTE — Progress Notes (Signed)
Pt. Aware of time change arrive at 0530 am and nothing by mouth 0430 am

## 2020-01-10 ENCOUNTER — Ambulatory Visit (HOSPITAL_COMMUNITY): Payer: 59 | Admitting: Physician Assistant

## 2020-01-10 ENCOUNTER — Ambulatory Visit (HOSPITAL_COMMUNITY)
Admission: RE | Admit: 2020-01-10 | Discharge: 2020-01-10 | Disposition: A | Discharge status: Home / Self Care | Payer: 59 | Source: Ambulatory Visit | Attending: Gynecologic Oncology | Admitting: Gynecologic Oncology

## 2020-01-10 ENCOUNTER — Encounter (HOSPITAL_COMMUNITY): Payer: Self-pay | Admitting: Gynecologic Oncology

## 2020-01-10 ENCOUNTER — Ambulatory Visit (HOSPITAL_COMMUNITY): Payer: 59 | Admitting: Anesthesiology

## 2020-01-10 ENCOUNTER — Encounter (HOSPITAL_COMMUNITY)
Admission: RE | Disposition: A | Discharge status: Home / Self Care | Payer: Self-pay | Source: Ambulatory Visit | Attending: Gynecologic Oncology

## 2020-01-10 DIAGNOSIS — C563 Malignant neoplasm of bilateral ovaries: Secondary | ICD-10-CM | POA: Diagnosis not present

## 2020-01-10 DIAGNOSIS — C5702 Malignant neoplasm of left fallopian tube: Secondary | ICD-10-CM | POA: Diagnosis not present

## 2020-01-10 DIAGNOSIS — K668 Other specified disorders of peritoneum: Secondary | ICD-10-CM | POA: Diagnosis not present

## 2020-01-10 DIAGNOSIS — N9489 Other specified conditions associated with female genital organs and menstrual cycle: Secondary | ICD-10-CM | POA: Diagnosis not present

## 2020-01-10 DIAGNOSIS — N888 Other specified noninflammatory disorders of cervix uteri: Secondary | ICD-10-CM | POA: Diagnosis not present

## 2020-01-10 DIAGNOSIS — K660 Peritoneal adhesions (postprocedural) (postinfection): Secondary | ICD-10-CM | POA: Diagnosis not present

## 2020-01-10 DIAGNOSIS — C561 Malignant neoplasm of right ovary: Secondary | ICD-10-CM | POA: Diagnosis not present

## 2020-01-10 DIAGNOSIS — Z800 Family history of malignant neoplasm of digestive organs: Secondary | ICD-10-CM | POA: Insufficient documentation

## 2020-01-10 DIAGNOSIS — N736 Female pelvic peritoneal adhesions (postinfective): Secondary | ICD-10-CM | POA: Diagnosis not present

## 2020-01-10 DIAGNOSIS — D630 Anemia in neoplastic disease: Secondary | ICD-10-CM | POA: Diagnosis not present

## 2020-01-10 DIAGNOSIS — C569 Malignant neoplasm of unspecified ovary: Secondary | ICD-10-CM

## 2020-01-10 DIAGNOSIS — C786 Secondary malignant neoplasm of retroperitoneum and peritoneum: Secondary | ICD-10-CM | POA: Diagnosis not present

## 2020-01-10 DIAGNOSIS — D61818 Other pancytopenia: Secondary | ICD-10-CM | POA: Diagnosis not present

## 2020-01-10 HISTORY — PX: OTHER SURGICAL HISTORY: SHX169

## 2020-01-10 LAB — CBC
HCT: 28.8 % — ABNORMAL LOW (ref 36.0–46.0)
Hemoglobin: 9.5 g/dL — ABNORMAL LOW (ref 12.0–15.0)
MCH: 31.9 pg (ref 26.0–34.0)
MCHC: 33 g/dL (ref 30.0–36.0)
MCV: 96.6 fL (ref 80.0–100.0)
Platelets: 141 10*3/uL — ABNORMAL LOW (ref 150–400)
RBC: 2.98 MIL/uL — ABNORMAL LOW (ref 3.87–5.11)
RDW: 17.1 % — ABNORMAL HIGH (ref 11.5–15.5)
WBC: 3.6 10*3/uL — ABNORMAL LOW (ref 4.0–10.5)
nRBC: 0 % (ref 0.0–0.2)

## 2020-01-10 SURGERY — XI ROBOTIC ASSISTED TOTAL HYSTERECTOMY BILATERAL SALPINGO OOPHORECTOMY WITH OMENTECTOMY AND DEBULKING
Anesthesia: General | Laterality: Bilateral

## 2020-01-10 MED ORDER — PROMETHAZINE HCL 25 MG/ML IJ SOLN: 6.2500 mg | INTRAMUSCULAR | Status: DC | PRN

## 2020-01-10 MED ORDER — SODIUM CHLORIDE 0.9 % IV SOLN
INTRAVENOUS | Status: AC
  Filled 2020-01-10: qty 2

## 2020-01-10 MED ORDER — DEXAMETHASONE SODIUM PHOSPHATE 10 MG/ML IJ SOLN
INTRAMUSCULAR | Status: AC
  Filled 2020-01-10: qty 3

## 2020-01-10 MED ORDER — FENTANYL CITRATE (PF) 100 MCG/2ML IJ SOLN
INTRAMUSCULAR | Status: DC | PRN
  Administered 2020-01-10 (×3): 50 ug via INTRAVENOUS

## 2020-01-10 MED ORDER — STERILE WATER FOR IRRIGATION IR SOLN
Status: DC | PRN
  Administered 2020-01-10: 1000 mL

## 2020-01-10 MED ORDER — SODIUM CHLORIDE 0.9% FLUSH: 3.0000 mL | Freq: Two times a day (BID) | INTRAVENOUS | Status: DC

## 2020-01-10 MED ORDER — MIDAZOLAM HCL 2 MG/2ML IJ SOLN
INTRAMUSCULAR | Status: AC
  Filled 2020-01-10: qty 2

## 2020-01-10 MED ORDER — EPHEDRINE SULFATE-NACL 50-0.9 MG/10ML-% IV SOSY
PREFILLED_SYRINGE | INTRAVENOUS | Status: DC | PRN
  Administered 2020-01-10 (×4): 5 mg via INTRAVENOUS

## 2020-01-10 MED ORDER — PROPOFOL 10 MG/ML IV BOLUS
INTRAVENOUS | Status: DC | PRN
  Administered 2020-01-10: 100 mg via INTRAVENOUS

## 2020-01-10 MED ORDER — GABAPENTIN 300 MG PO CAPS
300.0000 mg | ORAL_CAPSULE | ORAL | Status: DC
  Filled 2020-01-10: qty 1

## 2020-01-10 MED ORDER — MEPERIDINE HCL 50 MG/ML IJ SOLN
6.2500 mg | INTRAMUSCULAR | Status: DC | PRN
Start: 2020-01-10 — End: 2020-01-10

## 2020-01-10 MED ORDER — DEXAMETHASONE SODIUM PHOSPHATE 10 MG/ML IJ SOLN
INTRAMUSCULAR | Status: DC | PRN
  Administered 2020-01-10: 10 mg via INTRAVENOUS

## 2020-01-10 MED ORDER — MIDAZOLAM HCL 2 MG/2ML IJ SOLN: 0.5000 mg | Freq: Once | INTRAMUSCULAR | Status: DC | PRN

## 2020-01-10 MED ORDER — HYDROMORPHONE HCL 1 MG/ML IJ SOLN
0.2500 mg | INTRAMUSCULAR | Status: DC | PRN
Start: 2020-01-10 — End: 2020-01-10

## 2020-01-10 MED ORDER — OXYCODONE HCL 5 MG/5ML PO SOLN: 5.0000 mg | Freq: Once | ORAL | Status: DC | PRN

## 2020-01-10 MED ORDER — CELECOXIB 200 MG PO CAPS
400.0000 mg | ORAL_CAPSULE | ORAL | Status: AC
  Administered 2020-01-10: 400 mg via ORAL
  Filled 2020-01-10: qty 2

## 2020-01-10 MED ORDER — FENTANYL CITRATE (PF) 250 MCG/5ML IJ SOLN
INTRAMUSCULAR | Status: AC
  Filled 2020-01-10: qty 5

## 2020-01-10 MED ORDER — DEXAMETHASONE SODIUM PHOSPHATE 4 MG/ML IJ SOLN: 4.0000 mg | INTRAMUSCULAR | Status: DC

## 2020-01-10 MED ORDER — SCOPOLAMINE 1 MG/3DAYS TD PT72
1.0000 | MEDICATED_PATCH | TRANSDERMAL | Status: DC
  Administered 2020-01-10: 1.5 mg via TRANSDERMAL
  Filled 2020-01-10: qty 1

## 2020-01-10 MED ORDER — SODIUM CHLORIDE 0.9 % IV SOLN
2.0000 g | Freq: Once | INTRAVENOUS | Status: DC
  Filled 2020-01-10: qty 2

## 2020-01-10 MED ORDER — ROCURONIUM BROMIDE 10 MG/ML (PF) SYRINGE
PREFILLED_SYRINGE | INTRAVENOUS | Status: AC
  Filled 2020-01-10: qty 30

## 2020-01-10 MED ORDER — ONDANSETRON HCL 4 MG/2ML IJ SOLN
INTRAMUSCULAR | Status: DC | PRN
  Administered 2020-01-10: 4 mg via INTRAVENOUS

## 2020-01-10 MED ORDER — LIDOCAINE HCL 2 % IJ SOLN
INTRAMUSCULAR | Status: AC
  Filled 2020-01-10: qty 20

## 2020-01-10 MED ORDER — ENSURE PRE-SURGERY PO LIQD
296.0000 mL | Freq: Once | ORAL | Status: DC
  Filled 2020-01-10: qty 296

## 2020-01-10 MED ORDER — EPHEDRINE 5 MG/ML INJ
INTRAVENOUS | Status: AC
  Filled 2020-01-10: qty 10

## 2020-01-10 MED ORDER — ACETAMINOPHEN 500 MG PO TABS
1000.0000 mg | ORAL_TABLET | ORAL | Status: AC
  Administered 2020-01-10: 1000 mg via ORAL
  Filled 2020-01-10: qty 2

## 2020-01-10 MED ORDER — ENSURE PRE-SURGERY PO LIQD: 592.0000 mL | Freq: Once | ORAL | Status: DC

## 2020-01-10 MED ORDER — MIDAZOLAM HCL 5 MG/5ML IJ SOLN
INTRAMUSCULAR | Status: DC | PRN
  Administered 2020-01-10 (×2): 1 mg via INTRAVENOUS

## 2020-01-10 MED ORDER — GLYCOPYRROLATE PF 0.2 MG/ML IJ SOSY
PREFILLED_SYRINGE | INTRAMUSCULAR | Status: DC | PRN
  Administered 2020-01-10: .2 mg via INTRAVENOUS

## 2020-01-10 MED ORDER — ENOXAPARIN SODIUM 40 MG/0.4ML ~~LOC~~ SOLN
40.0000 mg | SUBCUTANEOUS | Status: AC
  Administered 2020-01-10: 40 mg via SUBCUTANEOUS
  Filled 2020-01-10: qty 0.4

## 2020-01-10 MED ORDER — BUPIVACAINE HCL 0.25 % IJ SOLN
INTRAMUSCULAR | Status: DC | PRN
  Administered 2020-01-10: 20 mL

## 2020-01-10 MED ORDER — OXYCODONE HCL 5 MG PO TABS: 5.0000 mg | ORAL_TABLET | Freq: Once | ORAL | Status: DC | PRN

## 2020-01-10 MED ORDER — LACTATED RINGERS IV SOLN: INTRAVENOUS | Status: DC

## 2020-01-10 MED ORDER — LIDOCAINE 2% (20 MG/ML) 5 ML SYRINGE
INTRAMUSCULAR | Status: DC | PRN
  Administered 2020-01-10: 80 mg via INTRAVENOUS

## 2020-01-10 MED ORDER — LIDOCAINE HCL (PF) 2 % IJ SOLN
INTRAMUSCULAR | Status: AC
  Filled 2020-01-10: qty 15

## 2020-01-10 MED ORDER — LACTATED RINGERS IR SOLN
Status: DC | PRN
  Administered 2020-01-10: 1000 mL

## 2020-01-10 MED ORDER — BUPIVACAINE HCL 0.25 % IJ SOLN
INTRAMUSCULAR | Status: AC
  Filled 2020-01-10: qty 1

## 2020-01-10 MED ORDER — ONDANSETRON HCL 4 MG/2ML IJ SOLN
INTRAMUSCULAR | Status: AC
  Filled 2020-01-10: qty 8

## 2020-01-10 MED ORDER — GLYCOPYRROLATE PF 0.2 MG/ML IJ SOSY
PREFILLED_SYRINGE | INTRAMUSCULAR | Status: AC
  Filled 2020-01-10: qty 1

## 2020-01-10 MED ORDER — PROPOFOL 10 MG/ML IV BOLUS
INTRAVENOUS | Status: AC
  Filled 2020-01-10: qty 60

## 2020-01-10 MED ORDER — LACTATED RINGERS IV SOLN: INTRAVENOUS | Status: DC | PRN

## 2020-01-10 MED ORDER — ROCURONIUM BROMIDE 10 MG/ML (PF) SYRINGE
PREFILLED_SYRINGE | INTRAVENOUS | Status: DC | PRN
  Administered 2020-01-10: 20 mg via INTRAVENOUS
  Administered 2020-01-10 (×2): 10 mg via INTRAVENOUS
  Administered 2020-01-10: 60 mg via INTRAVENOUS

## 2020-01-10 MED ORDER — SUGAMMADEX SODIUM 200 MG/2ML IV SOLN
INTRAVENOUS | Status: DC | PRN
  Administered 2020-01-10: 200 mg via INTRAVENOUS

## 2020-01-10 SURGICAL SUPPLY — 73 items
ADH SKN CLS APL DERMABOND .7 (GAUZE/BANDAGES/DRESSINGS) ×1
AGENT HMST KT MTR STRL THRMB (HEMOSTASIS)
APL ESCP 34 STRL LF DISP (HEMOSTASIS)
APPLICATOR SURGIFLO ENDO (HEMOSTASIS) IMPLANT
BACTOSHIELD CHG 4% 4OZ (MISCELLANEOUS) ×1
BAG LAPAROSCOPIC 12 15 PORT 16 (BASKET) IMPLANT
BAG RETRIEVAL 12/15 (BASKET) ×2
BAG SPEC RTRVL LRG 6X4 10 (ENDOMECHANICALS)
BLADE SURG SZ10 CARB STEEL (BLADE) IMPLANT
COVER BACK TABLE 60X90IN (DRAPES) ×2 IMPLANT
COVER TIP SHEARS 8 DVNC (MISCELLANEOUS) ×1 IMPLANT
COVER TIP SHEARS 8MM DA VINCI (MISCELLANEOUS) ×2
COVER WAND RF STERILE (DRAPES) IMPLANT
DECANTER SPIKE VIAL GLASS SM (MISCELLANEOUS) IMPLANT
DERMABOND ADVANCED (GAUZE/BANDAGES/DRESSINGS) ×1
DERMABOND ADVANCED .7 DNX12 (GAUZE/BANDAGES/DRESSINGS) ×1 IMPLANT
DRAPE ARM DVNC X/XI (DISPOSABLE) ×4 IMPLANT
DRAPE COLUMN DVNC XI (DISPOSABLE) ×1 IMPLANT
DRAPE DA VINCI XI ARM (DISPOSABLE) ×8
DRAPE DA VINCI XI COLUMN (DISPOSABLE) ×2
DRAPE SHEET LG 3/4 BI-LAMINATE (DRAPES) ×2 IMPLANT
DRAPE SURG IRRIG POUCH 19X23 (DRAPES) ×2 IMPLANT
DRSG OPSITE POSTOP 4X6 (GAUZE/BANDAGES/DRESSINGS) IMPLANT
DRSG OPSITE POSTOP 4X8 (GAUZE/BANDAGES/DRESSINGS) IMPLANT
ELECT PENCIL ROCKER SW 15FT (MISCELLANEOUS) IMPLANT
ELECT REM PT RETURN 15FT ADLT (MISCELLANEOUS) ×2 IMPLANT
GLOVE SURG ENC MOIS LTX SZ6 (GLOVE) ×8 IMPLANT
GLOVE SURG ENC MOIS LTX SZ6.5 (GLOVE) ×4 IMPLANT
GOWN STRL REUS W/ TWL LRG LVL3 (GOWN DISPOSABLE) ×4 IMPLANT
GOWN STRL REUS W/TWL LRG LVL3 (GOWN DISPOSABLE) ×8
HOLDER FOLEY CATH W/STRAP (MISCELLANEOUS) IMPLANT
IRRIG SUCT STRYKERFLOW 2 WTIP (MISCELLANEOUS) ×2
IRRIGATION SUCT STRKRFLW 2 WTP (MISCELLANEOUS) ×1 IMPLANT
KIT PROCEDURE DA VINCI SI (MISCELLANEOUS)
KIT PROCEDURE DVNC SI (MISCELLANEOUS) IMPLANT
KIT TURNOVER KIT A (KITS) IMPLANT
MANIPULATOR UTERINE 4.5 ZUMI (MISCELLANEOUS) ×2 IMPLANT
NDL SPNL 18GX3.5 QUINCKE PK (NEEDLE) IMPLANT
NEEDLE HYPO 22GX1.5 SAFETY (NEEDLE) ×2 IMPLANT
NEEDLE SPNL 18GX3.5 QUINCKE PK (NEEDLE) IMPLANT
OBTURATOR OPTICAL STANDARD 8MM (TROCAR) ×2
OBTURATOR OPTICAL STND 8 DVNC (TROCAR) ×1
OBTURATOR OPTICALSTD 8 DVNC (TROCAR) ×1 IMPLANT
PACK ROBOT GYN CUSTOM WL (TRAY / TRAY PROCEDURE) ×2 IMPLANT
PAD POSITIONING PINK XL (MISCELLANEOUS) ×2 IMPLANT
PORT ACCESS TROCAR AIRSEAL 12 (TROCAR) ×1 IMPLANT
PORT ACCESS TROCAR AIRSEAL 5M (TROCAR) ×1
POUCH SPECIMEN RETRIEVAL 10MM (ENDOMECHANICALS) IMPLANT
SCRUB CHG 4% DYNA-HEX 4OZ (MISCELLANEOUS) ×1 IMPLANT
SEAL CANN UNIV 5-8 DVNC XI (MISCELLANEOUS) ×3 IMPLANT
SEAL XI 5MM-8MM UNIVERSAL (MISCELLANEOUS) ×6
SEALER VESSEL DA VINCI XI (MISCELLANEOUS) ×2
SEALER VESSEL EXT DVNC XI (MISCELLANEOUS) IMPLANT
SET TRI-LUMEN FLTR TB AIRSEAL (TUBING) ×2 IMPLANT
SPONGE LAP 18X18 RF (DISPOSABLE) IMPLANT
SURGIFLO W/THROMBIN 8M KIT (HEMOSTASIS) IMPLANT
SUT MNCRL AB 4-0 PS2 18 (SUTURE) ×2 IMPLANT
SUT PDS AB 1 TP1 96 (SUTURE) IMPLANT
SUT VIC AB 0 CT1 27 (SUTURE)
SUT VIC AB 0 CT1 27XBRD ANTBC (SUTURE) IMPLANT
SUT VIC AB 2-0 CT1 27 (SUTURE)
SUT VIC AB 2-0 CT1 TAPERPNT 27 (SUTURE) IMPLANT
SUT VIC AB 4-0 PS2 18 (SUTURE) ×4 IMPLANT
SUT VICRYL 4-0 PS2 18IN ABS (SUTURE) ×1 IMPLANT
SYR 10ML LL (SYRINGE) IMPLANT
SYR 20ML LL LF (SYRINGE) IMPLANT
SYR 50ML LL SCALE MARK (SYRINGE) IMPLANT
TOWEL OR NON WOVEN STRL DISP B (DISPOSABLE) ×2 IMPLANT
TRAP SPECIMEN MUCUS 40CC (MISCELLANEOUS) IMPLANT
TRAY FOLEY MTR SLVR 16FR STAT (SET/KITS/TRAYS/PACK) ×2 IMPLANT
TROCAR XCEL NON-BLD 5MMX100MML (ENDOMECHANICALS) IMPLANT
UNDERPAD 30X36 HEAVY ABSORB (UNDERPADS AND DIAPERS) ×2 IMPLANT
WATER STERILE IRR 1000ML POUR (IV SOLUTION) ×2 IMPLANT

## 2020-01-10 NOTE — Discharge Instructions (Signed)
01/10/2020  Return to work: 4 weeks  Activity: 1. Be up and out of the bed during the day.  Take a nap if needed.  You may walk up steps but be careful and use the hand rail.  Stair climbing will tire you more than you think, you may need to stop part way and rest.   2. No lifting or straining for 4 weeks.  3. No driving for 1 week.  Do Not drive if you are taking narcotic pain medicine.  4. Shower daily.  Use soap and water on your incision and pat dry; don't rub.   5. No sexual activity and nothing in the vagina for 8 weeks.  Medications:  - Restart eliquis on Tuesday 01/17/19.   - Take ibuprofen and tylenol first line for pain control. Take these regularly (every 6 hours) to decrease the build up of pain.  - If necessary, for severe pain not relieved by ibuprofen, take oxycodone.  - While taking oxycodone you should take sennakot every night to reduce the likelihood of constipation. If this causes diarrhea, stop its use.  Diet: 1. Low sodium Heart Healthy Diet is recommended.  2. It is safe to use a laxative if you have difficulty moving your bowels.   Wound Care: 1. Keep clean and dry.   2. Do not apply salves or lotions to incisions 3. It is ok to shower daily and get the incisions wet (then dry them).  Reasons to call the Doctor:   Fever - Oral temperature greater than 100.4 degrees Fahrenheit  Foul-smelling vaginal discharge  Difficulty urinating  Nausea and vomiting  Increased pain at the site of the incision that is unrelieved with pain medicine.  Difficulty breathing with or without chest pain  New calf pain especially if only on one side  Sudden, continuing increased vaginal bleeding with or without clots.   Follow-up: 1. See Everitt Amber in 2-3 weeks.  Contacts: For questions or concerns you should contact:  Dr. Everitt Amber at [phone] After hours and on week-ends call [phone] and ask to speak to the physician on call for Gynecologic  Oncology   After Your Surgery  The information in this section will tell you what to expect after your surgery, both during your stay and after you leave. You will learn how to safely recover from your surgery. Write down any questions you have and be sure to ask your doctor or nurse.  What to Expect When you wake up after your surgery, you will be in the [facility] (P[facility]U) or your recovery room. A nurse will be monitoring your body temperature, blood pressure, pulse, and oxygen levels. You may have a urinary catheter in your bladder to help monitor the amount of urine you are making. It should come out before you go home. You will also have compression boots on your lower legs to help your circulation. Your pain medication will be given through an IV line or in tablet form. If you are having pain, tell your nurse. Your nurse will tell you how to recover from your surgery. Below are examples of ways you can help yourself recover safely. . You will be encouraged to walk with the help of your nurse or physical therapist. We will give you medication to relieve pain. Walking helps reduce the risk for blood clots and pneumonia. It also helps to stimulate your bowels so they begin working again. . Use your incentive spirometer. This will help your lungs  expand, which prevents pneumonia.   Commonly Asked Questions  Will I have pain after surgery? Yes, you will have some pain after your surgery, especially in the first few days. Your doctor and nurse will ask you about your pain often. You will be given medication to manage your pain as needed. If your pain is not relieved, please tell your doctor or nurse. It is important to control your pain so you can cough, breathe deeply, use your incentive spirometer, and get out of bed and walk.  Will I be able to eat? Yes, you will be able to eat a regular diet or eat as tolerated. You should start with foods that are soft and easy to digest such  as apple sauce and chicken noodle soup. Eat small meals frequently, and then advance to regular foods. If you experience bloating, gas, or cramps, limit high-fiber foods, including whole grain breads and cereal, nuts, seeds, salads, fresh fruit, broccoli, cabbage, and cauliflower. Will I have pain when I am home? The length of time each person has pain or discomfort varies. You may still have some pain when you go home and will probably be taking pain medication. Follow the guidelines below. . Take your medications as directed and as needed. . Call your doctor if the medication prescribed for you doesn't relieve your pain. . Don't drive or drink alcohol while you're taking prescription pain medication. . As your incision heals, you will have less pain and need less pain medication. A mild pain reliever such as acetaminophen (Tylenol) or ibuprofen (Advil) will relieve aches and discomfort. However, large quantities of acetaminophen may be harmful to your liver. Don't take more acetaminophen than the amount directed on the bottle or as instructed by your doctor or nurse. . Pain medication should help you as you resume your normal activities. Take enough medication to do your exercises comfortably. Pain medication is most effective 30 to 45 minutes after taking it. Marland Kitchen Keep track of when you take your pain medication. Taking it when your pain first begins is more effective than waiting for the pain to get worse. Pain medication may cause constipation (having fewer bowel movements than what is normal for you).  How can I prevent constipation? . Go to the bathroom at the same time every day. Your body will get used to going at that time. . If you feel the urge to go, don't put it off. Try to use the bathroom 5 to 15 minutes after meals. . After breakfast is a good time to move your bowels. The reflexes in your colon are strongest at this time. . Exercise, if you can. Walking is an excellent form of  exercise. . Drink 8 (8-ounce) glasses (2 liters) of liquids daily, if you can. Drink water, juices, soups, ice cream shakes, and other drinks that don't have caffeine. Drinks with caffeine, such as coffee and soda, pull fluid out of the body. . Slowly increase the fiber in your diet to 25 to 35 grams per day. Fruits, vegetables, whole grains, and cereals contain fiber. If you have an ostomy or have had recent bowel surgery, check with your doctor or nurse before making any changes in your diet. . Both over-the-counter and prescription medications are available to treat constipation. Start with 1 of the following over-the-counter medications first: o Docusate sodium (Colace) 100 mg. Take ___1__ capsules _2____ times a day. This is a stool softener that causes few side effects. Don't take it with mineral oil. o Polyethylene  glycol (MiraLAX) 17 grams daily. o Senna (Senokot) 2 tablets at bedtime. This is a stimulant laxative, which can cause cramping. . If you haven't had a bowel movement in 2 days, call your doctor or nurse.  Can I shower? Yes, you should shower 24 hours after your surgery. Be sure to shower every day. Taking a warm shower is relaxing and can help decrease muscle aches. Use soap when you shower and gently wash your incision. Pat the areas dry with a towel after showering, and leave your incision uncovered (unless there is drainage). Call your doctor if you see any redness or drainage from your incision. Don't take tub baths until you discuss it with your doctor at the first appointment after your surgery. How do I care for my incisions? You will have several small incisions on your abdomen. The incisions are closed with Steri-Strips or Dermabond. You may also have square white dressings on your incisions (Primapore). You can remove these in the shower 24 hours after your surgery. You should clean your incisions with soap and water. If you go home with Steri-Strips on your incision,  they will loosen and may fall off by themselves. If they haven't fallen off within 10 days, you can remove them. If you go home with Dermabond over your sutures (stitches), it will also loosen and peel off.  What are the most common symptoms after a hysterectomy? It's common for you to have some vaginal spotting or light bleeding. You should monitor this with a pad or a panty liner. If you have having heavy bleeding (bleeding through a pad or liner every 1 to 2 hours), call your doctor right away. It's also common to have some discomfort after surgery from the air that was pumped into your abdomen during surgery. To help with this, walk, drink plenty of liquids and make sure to take the stool softeners you received.  When is it safe for me to drive? You may resume driving 2 weeks after surgery, as long as you are not taking pain medication that may make you drowsy.  When can I resume sexual activity? Do not place anything in your vagina or have vaginal intercourse for 8 weeks after your surgery. Some people will need to wait longer than 8 weeks, so speak with your doctor before resuming sexual intercourse.  Will I be able to travel? Yes, you can travel. If you are traveling by plane within a few weeks after your surgery, make sure you get up and walk every hour. Be sure to stretch your legs, drink plenty of liquids, and keep your feet elevated when possible.  Will I need any supplies? Most people do not need any supplies after the surgery. In the rare case that you do need supplies, such as tubes or drains, your nurse will order them for you.  When can I return to work? The time it takes to return to work depends on the type of work you do, the type of surgery you had, and how fast your body heals. Most people can return to work about 2 to 4 weeks after the surgery.  What exercises can I do? Exercise will help you gain strength and feel better. Walking and stair climbing are excellent forms of  exercise. Gradually increase the distance you walk. Climb stairs slowly, resting or stopping as needed. Ask your doctor or nurse before starting more strenuous exercises.  When can I lift heavy objects? Most people should not lift anything heavier than 10  pounds (4.5 kilograms) for at least 4 weeks after surgery. Speak with your doctor about when you can do heavy lifting.  How can I cope with my feelings? After surgery for a serious illness, you may have new and upsetting feelings. Many people say they felt weepy, sad, worried, nervous, irritable, and angry at one time or another. You may find that you can't control some of these feelings. If this happens, it's a good idea to seek emotional support. The first step in coping is to talk about how you feel. Family and friends can help. Your nurse, doctor, and social worker can reassure, support, and guide you. It's always a good idea to let these professionals know how you, your family, and your friends are feeling emotionally. Many resources are available to patients and their families. Whether you're in the hospital or at home, the nurses, doctors, and social workers are here to help you and your family and friends handle the emotional aspects of your illness.  When is my first appointment after surgery? Your first appointment after surgery will be 2 to 4 weeks after surgery. Your nurse will give you instructions on how to make this appointment, including the phone number to call.  What if I have other questions? If you have any questions or concerns, please talk with your doctor or nurse. You can reach them Monday through Friday from 9:00 am to 5:00 pm. After 5:00 pm, during the weekend, and on holidays, call [phone] and ask for the doctor on call for your doctor.  . Have a temperature of 101 F (38.3 C) or higher . Have pain that does not get better with pain medication . Have redness, drainage, or swelling from your  incisions     General Anesthesia, Adult, Care After This sheet gives you information about how to care for yourself after your procedure. Your health care provider may also give you more specific instructions. If you have problems or questions, contact your health care provider. What can I expect after the procedure? After the procedure, the following side effects are common:  Pain or discomfort at the IV site.  Nausea.  Vomiting.  Sore throat.  Trouble concentrating.  Feeling cold or chills.  Weak or tired.  Sleepiness and fatigue.  Soreness and body aches. These side effects can affect parts of the body that were not involved in surgery. Follow these instructions at home:  For at least 24 hours after the procedure:  Have a responsible adult stay with you. It is important to have someone help care for you until you are awake and alert.  Rest as needed.  Do not: ? Participate in activities in which you could fall or become injured. ? Drive. ? Use heavy machinery. ? Drink alcohol. ? Take sleeping pills or medicines that cause drowsiness. ? Make important decisions or sign legal documents. ? Take care of children on your own. Eating and drinking  Follow any instructions from your health care provider about eating or drinking restrictions.  When you feel hungry, start by eating small amounts of foods that are soft and easy to digest (bland), such as toast. Gradually return to your regular diet.  Drink enough fluid to keep your urine pale yellow.  If you vomit, rehydrate by drinking water, juice, or clear broth. General instructions  If you have sleep apnea, surgery and certain medicines can increase your risk for breathing problems. Follow instructions from your health care provider about wearing your sleep device: ?  Anytime you are sleeping, including during daytime naps. ? While taking prescription pain medicines, sleeping medicines, or medicines that make you  drowsy.  Return to your normal activities as told by your health care provider. Ask your health care provider what activities are safe for you.  Take over-the-counter and prescription medicines only as told by your health care provider.  If you smoke, do not smoke without supervision.  Keep all follow-up visits as told by your health care provider. This is important. Contact a health care provider if:  You have nausea or vomiting that does not get better with medicine.  You cannot eat or drink without vomiting.  You have pain that does not get better with medicine.  You are unable to pass urine.  You develop a skin rash.  You have a fever.  You have redness around your IV site that gets worse. Get help right away if:  You have difficulty breathing.  You have chest pain.  You have blood in your urine or stool, or you vomit blood. Summary  After the procedure, it is common to have a sore throat or nausea. It is also common to feel tired.  Have a responsible adult stay with you for the first 24 hours after general anesthesia. It is important to have someone help care for you until you are awake and alert.  When you feel hungry, start by eating small amounts of foods that are soft and easy to digest (bland), such as toast. Gradually return to your regular diet.  Drink enough fluid to keep your urine pale yellow.  Return to your normal activities as told by your health care provider. Ask your health care provider what activities are safe for you. This information is not intended to replace advice given to you by your health care provider. Make sure you discuss any questions you have with your health care provider. Document Revised: 01/02/2017 Document Reviewed: 08/15/2016 Elsevier Patient Education  [place].

## 2020-01-10 NOTE — Interval H&P Note (Signed)
History and Physical Interval Note:  01/10/2020 7:16 AM  Kristina Flores  has presented today for surgery, with the diagnosis of OVARIAN CANCER.  The various methods of treatment have been discussed with the patient and family. After consideration of risks, benefits and other options for treatment, the patient has consented to  Procedure(s): XI ROBOTIC ASSISTED TOTAL HYSTERECTOMY BILATERAL SALPINGO OOPHORECTOMY WITH OMENTECTOMY AND DEBULKING POSSIBLE LAPAROTOMY AND POSSIBLE BOWEL RESECTION (Bilateral) as a surgical intervention.  The patient's history has been reviewed, patient examined, no change in status, stable for surgery.  I have reviewed the patient's chart and labs.    CBC    Component Value Date/Time   WBC 3.6 (L) 01/10/2020 0545   RBC 2.98 (L) 01/10/2020 0545   HGB 9.5 (L) 01/10/2020 0545   HGB 10.2 (L) 09/20/2019 1112   HCT 28.8 (L) 01/10/2020 0545   HCT 30.4 (L) 09/20/2019 1112   PLT 141 (L) 01/10/2020 0545   PLT 352 09/20/2019 1112   MCV 96.6 01/10/2020 0545   MCV 89 09/20/2019 1112   MCH 31.9 01/10/2020 0545   MCHC 33.0 01/10/2020 0545   RDW 17.1 (H) 01/10/2020 0545   RDW 13.8 09/20/2019 1112   LYMPHSABS 1.1 12/26/2019 1244   LYMPHSABS 1.6 02/28/2019 1529   MONOABS 0.3 12/26/2019 1244   EOSABS 0.0 12/26/2019 1244   EOSABS 0.2 02/28/2019 1529   BASOSABS 0.0 12/26/2019 1244   BASOSABS 0.0 02/28/2019 1529    BMET    Component Value Date/Time   NA 139 12/29/2019 1029   NA 142 02/28/2019 1529   K 4.5 12/29/2019 1029   CL 103 12/29/2019 1029   CO2 26 12/29/2019 1029   GLUCOSE 96 12/29/2019 1029   BUN 25 (H) 12/29/2019 1029   BUN 14 02/28/2019 1529   CREATININE 0.81 12/29/2019 1029   CREATININE 0.79 05/28/2019 1156   CALCIUM 9.1 12/29/2019 1029   GFRNONAA >60 12/29/2019 1029   GFRNONAA 81 05/28/2019 1156   GFRAA 94 05/28/2019 1156   Questions were answered to the patient's satisfaction.     Luisa Dago

## 2020-01-10 NOTE — Anesthesia Procedure Notes (Signed)
Procedure Name: Intubation Date/Time: 01/10/2020 8:05 AM Performed by: Lavina Hamman, CRNA Pre-anesthesia Checklist: Patient identified, Emergency Drugs available, Suction available, Patient being monitored and Timeout performed Patient Re-evaluated:Patient Re-evaluated prior to induction Oxygen Delivery Method: Circle system utilized Preoxygenation: Pre-oxygenation with 100% oxygen Induction Type: IV induction Ventilation: Mask ventilation without difficulty Laryngoscope Size: Mac and 3 Grade View: Grade I Tube type: Oral Tube size: 7.5 mm Number of attempts: 1 Airway Equipment and Method: Stylet Placement Confirmation: ETT inserted through vocal cords under direct vision,  positive ETCO2,  CO2 detector and breath sounds checked- equal and bilateral Secured at: 22 cm Tube secured with: Tape Dental Injury: Teeth and Oropharynx as per pre-operative assessment  Comments: ATOI

## 2020-01-10 NOTE — Anesthesia Postprocedure Evaluation (Signed)
Anesthesia Post Note  Patient: Kristina Flores  Procedure(s) Performed: XI ROBOTIC ASSISTED TOTAL HYSTERECTOMY BILATERAL SALPINGO OOPHORECTOMY WITH OMENTECTOMY, TUMOR DEBULKING (Bilateral )     Patient location during evaluation: Phase II Anesthesia Type: General Level of consciousness: awake and alert, patient cooperative and oriented Pain management: pain level controlled Vital Signs Assessment: post-procedure vital signs reviewed and stable Respiratory status: spontaneous breathing, nonlabored ventilation and respiratory function stable Cardiovascular status: blood pressure returned to baseline and stable Postop Assessment: no apparent nausea or vomiting, able to ambulate and adequate PO intake Anesthetic complications: no   No complications documented.  Last Vitals:  Vitals:   01/10/20 1130 01/10/20 1150  BP: 130/75 131/79  Pulse:  65  Resp:    Temp:  (!) 36.4 C  SpO2:  95%    Last Pain:  Vitals:   01/10/20 1150  TempSrc: Oral  PainSc: 2                  Crystale Giannattasio,E. Bearett Porcaro

## 2020-01-10 NOTE — Op Note (Signed)
OPERATIVE NOTE 01/10/20  Surgeon: Quinn Axe   Assistants: Dr Antionette Char (an MD assistant was necessary for tissue manipulation, management of robotic instrumentation, retraction and positioning due to the complexity of the case and hospital policies).   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   Post-operative Diagnosis: same  Operation: Robotic-assisted laparoscopic total hysterectomy with bilateral salpingoophorectomy, omentectomy, radical tumor debulking   Surgeon: Quinn Axe  Assistant Surgeon: Antionette Char MD  Anesthesia: GET  Urine Output: 200cc  Operative Findings:  : bulky right ovarian mass (approximately 6cm), mild nodularity to left ovary, tumor nodules on peritoneum of pelvis (all resected), no gross omental disease. No gross residual tumor at the completion of the procedure representing a complete (optimal) cytoreduction).  Estimated Blood Loss:  less than 50 mL      Total IV Fluids: 800 ml         Specimens: uterus with cervix and bilateral tubes and ovaries, peritoneal nodules, omentum         Complications:  None; patient tolerated the procedure well.         Disposition: PACU - hemodynamically stable.  Procedure Details  The patient was seen in the Holding Room. The risks, benefits, complications, treatment options, and expected outcomes were discussed with the patient.  The patient concurred with the proposed plan, giving informed consent.  The site of surgery properly noted/marked. The patient was identified as Kristina Flores and the procedure verified as a Robotic-assisted hysterectomy with bilateral salpingo oophorectomy with omentectomy and radical tumor debulking. A Time Out was held and the above information confirmed.  After induction of anesthesia, the  patient was draped and prepped in the usual sterile manner. Pt was placed in supine position after anesthesia and draped and prepped in the usual sterile manner. The abdominal drape was placed after the CholoraPrep had been allowed to dry for 3 minutes.  Her arms were tucked to her side with all appropriate precautions.  The shoulders were stabilized with padded shoulder blocks applied to the acromium processes.  The patient was placed in the semi-lithotomy position in Opdyke stirrups.  The perineum was prepped with Betadine. The patient was then prepped. Foley catheter was placed.  A sterile speculum was placed in the vagina.  The cervix was grasped with a single-tooth tenaculum and dilated with Shawnie Pons dilators.  The ZUMI uterine manipulator with a medium colpotomizer ring was placed without difficulty.  A pneum occluder balloon was placed over the manipulator.  OG tube placement was confirmed and to suction.   Next, a 5 mm skin incision was made 1 cm below the subcostal margin in the midclavicular line.  The 5 mm Optiview port and scope was used for direct entry.  Opening pressure was under 10 mm CO2.  The abdomen was insufflated and the findings were noted as above.   At this point and all points during the procedure, the patient's intra-abdominal pressure did not exceed 15 mmHg. Next, a 10 mm skin incision was made in the umbilicus and a right and left port was placed about 10 cm lateral to the robot port on the right and left side.  A fourth arm was placed in the right lower quadrant 2 cm above and superior and medial to the anterior superior iliac spine.  All ports were placed under direct visualization.  The patient was  placed in steep Trendelenburg.  Bowel was folded away into the upper abdomen.  The robot was docked in the normal manner.  The hysterectomy was started after the round ligament on the right side was incised and the retroperitoneum was entered and the pararectal space was developed.  The  ureter was noted to be on the medial leaf of the broad ligament.  The peritoneum above the ureter was incised and stretched and the infundibulopelvic ligament was skeletonized, cauterized and cut. The right ovarian tumor mass was adherent to the right ovarian fossa. It was carefully resected from the peritoneal sidewall removing all gross tumor in the process. The posterior peritoneum was taken down to the level of the KOH ring.  The anterior peritoneum was also taken down.  The bladder flap was created to the level of the KOH ring.  The uterine artery on the right side was skeletonized, cauterized and cut in the normal manner.  A similar procedure was performed on the left.  The colpotomy was made and the uterus, cervix, bilateral ovaries and tubes were amputated and delivered through the vagina.    The peritoneal tumor nodules were sharply dissected from the peritoneal attachments using the scissors removing all gross residual disease. Pedicles were inspected and excellent hemostasis was achieved.    The omentectomy was then performed. The omentum was delivered into the mid abdomen and elevated. The parietal peritonum that attaches the omentum to the transverse colon was incised facilitating separation of the mid portion of the omentum from the transverse colon. The right sided omentum with its vascular pedicles was then skeletonized in its attachments to the right transverse colon. These vascular pedicles were bipolar sealed and transected. Observation of the colonic wall occurred throughout. The dissection then moved progressively along the colon the the left splenic flexure of the transverse colon. The bipolar sealing forceps and the scissors were used to skeletonize vascular omental pedicles, seal them and transect them until the entire infracolic omentum had been freed from its transverse colonic attachments to the splenic flexure. The omentum was then placed in an endocatch bag and retrieved from the the  vagina.  The colpotomy at the vaginal cuff was closed with Vicryl on a CT1 needle in a running manner.  Irrigation was used and excellent hemostasis was achieved.  At this point in the procedure was completed.  Robotic instruments were removed under direct visulaization.  The robot was undocked. The 10 mm ports were closed with Vicryl on a UR-5 needle and the fascia was closed with 0 Vicryl on a UR-5 needle.  The skin was closed with 4-0 Vicryl in a subcuticular manner.  Dermabond was applied.  Sponge, lap and needle counts correct x 2.  The patient was taken to the recovery room in stable condition.  The vagina was swabbed with  minimal bleeding noted.   All instrument and needle counts were correct x  3.   The patient was transferred to the recovery room in a stable condition.  Donaciano Eva, MD

## 2020-01-10 NOTE — Transfer of Care (Addendum)
Immediate Anesthesia Transfer of Care Note  Patient: Kristina Flores  Procedure(s) Performed: Procedure(s): XI ROBOTIC ASSISTED TOTAL HYSTERECTOMY BILATERAL SALPINGO OOPHORECTOMY WITH OMENTECTOMY, TUMOR DEBULKING (Bilateral)  Patient Location: PACU  Anesthesia Type:General  Level of Consciousness:  sedated, patient cooperative and responds to stimulation  Airway & Oxygen Therapy:Patient Spontanous Breathing and Patient connected to face mask oxgen  Post-op Assessment:  Report given to PACU RN and Post -op Vital signs reviewed and stable  Post vital signs:  Reviewed and stable  Last Vitals:  Vitals:   01/10/20 0524  BP: 132/88  Pulse: 81  Resp: 16  Temp: 36.7 C  SpO2: 98%    Complications: No apparent anesthesia complications

## 2020-01-11 ENCOUNTER — Telehealth: Payer: Self-pay

## 2020-01-11 NOTE — Telephone Encounter (Signed)
Ms Scifres states that she is eating,drinking, and urinating well. Has not passed gas. No N/V.  Will increase the senokot to 2 tabs bid with 8 oz of water.  If no BM by Friday midday she can add a capful of Miralax bid. Incisions are D&I. Afebrile. Pain controled with Tylenol and Ibuprofen. Pt aware of post op appointments as well as the office number [phone] and after hours number [phone] to call if she has any questions or concerns

## 2020-01-12 LAB — TYPE AND SCREEN
ABO/RH(D): O NEG
Antibody Screen: POSITIVE
Unit division: 0
Unit division: 0

## 2020-01-12 LAB — BPAM RBC
Blood Product Expiration Date: 202201252359
Blood Product Expiration Date: 202201252359
Unit Type and Rh: 9500
Unit Type and Rh: 9500

## 2020-01-14 DIAGNOSIS — S92909A Unspecified fracture of unspecified foot, initial encounter for closed fracture: Secondary | ICD-10-CM

## 2020-01-14 HISTORY — DX: Unspecified fracture of unspecified foot, initial encounter for closed fracture: S92.909A

## 2020-01-17 MED FILL — ELIQUIS 5 MG TABLET: 5 | 30 days supply | Qty: 60 | Fill #2

## 2020-01-18 MED FILL — GABAPENTIN 300 MG CAPSULE: 300 | 90 days supply | Qty: 270 | Fill #1

## 2020-01-18 NOTE — Progress Notes (Signed)
Follow-up Note: Gyn-Onc  Consult was requested by Dr. Alvy Bimler for the evaluation of Kristina Flores 60 y.o. female  CC:  Chief Complaint  Patient presents with  . Malignant neoplasm of ovary, unspecified laterality ([place])  . Counseling and coordination of care    Assessment/Plan:  Kristina Flores  is a 60 y.o.  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 BRCA undetermined.  S/p interval cytoreduction (complete) on 01/10/20.  Kristina Flores has had a very good response to the first 4 cycles of chemotherapy.  We will plan on 2-3 additional cycles of adjuvant carboplatin and paclitaxel and if her post-treatment scans continue to show no measurable disease, she can transition to either maintenance PARPi (if BRCA/HRD positive) or to close surveillance.  It is reasonable for her to have her IVC filter removed as I do not plan on her having any additional surgeries that would require prolonged breaks from anticoagulant therapy.   HPI: Kristina Flores is a very pleasant 60 year old P3 who was seen in consultation at the request of Dr Alvy Bimler for evaluation of stage IIIc high-grade serous ovarian cancer.  The patient reported feeling vague symptoms of abdominal bloating and early satiety in late August and early September 2021.  She attributed this to some decreased activity due to her recent fractured toe.  She mentioned to her primary care physician who performed a pelvic exam which was unremarkable at that time and ordered a plain abdominal x-ray which showed stool burden but no other changes.  The patient subsequently departed for a planned trip to [name] where [place]  While out camping in [place] she developed severe shortness of breath and progressive abdominal distention and discomfort.  This caught her camping trip short and she was seen by local emergency department in [name] where she  was admitted and CT scans were performed.  The CT scans showed pulmonary embolisms in all lobes.  A large right central pelvic mass which is likely an ovarian neoplasm measuring at least 11 cm.  There were omental and peritoneal metastases with significant ascites.  A small left uterine calcification consistent with a fibroid was seen.  There was small right and left lung base nodules which could represent sequelae of previous infection or possible metastases.  She was admitted to a hospital in [name] and an IVC filter was placed on September 30, 2019. Tumor markers on October 03, 2019 included a Ca1 25 which was elevated at 713.  CA 19-9 was elevated to 100.  CEA was normal at 2.9. Paracentesis was performed on September 30, 2019 and showed adenocarcinoma. CT-guided biopsy of the pelvic mass on October 03, 2019 showed adenocarcinoma with malignant stains positive for keratin, CK7 and MOC-31.  There were negative for CK20 calretinin CDX2, ER, and WT 1.  The findings were consistent with a poorly differentiated adenocarcinoma of possible gynecologic primary.  Pancreas biliary urothelial primaries could also be considered.  She received her first cycle of carboplatin and paclitaxel as an inpatient in [name] on October 23, 2019.  She had been started on Eliquis for the DVT and PE.  An IVC filter had been placed as stated above.  She was able to return to [place] from [place] after her 1st cycle of chemotherapy and established care with Dr Denman George and Dr Alvy Bimler from [facility].  She completed her 4th cycle of  chemotherapy with carboplatin and paclitaxel on 12/13/19.  CA 125 on 11/16/19, which was day 1 of cycle 3, was normal at 30.  CT chest, abdomen and pelvis on 12/12/19 showed a solid mass in the right ovary measuring 4.2 x 4.2 x 3.3 cm. Uterus and left ovary are unremarkable in appearance. Posteriorly to this there was a 4.9 x 4.1 x 4.6 cm macrolobulated cystic lesion with multiple thick  internal septations. Overall, the combined size of these lesions or this lesion appears decreased compared to the prior examination from 10/05/2019, indicating a positive response to therapy.No new lesions were seen. There was reduction in ascites present.   It was determined that she had demonstrated good partial response to 4 cycles of neoadjuvant chemotherapy, a 3 month time period had elapsed since her PE, and was then a good candidate for interval surgical cytoreduction.   Interval Hx:  On 01/10/20 she underwent robotic assisted total hysterectomy, BSO, omentectomy. Intraoperative findings were significant for a bulky right ovarian mass measuring approximately 6 cm, mild nodularity to the left ovary, tumor nodules on the peritoneum of the pelvis which were all resected, no gross omental disease.  No gross residual tumor at the completion of the procedure representing complete, optimal cytoreduction. Surgery was uncomplicated.  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  The focus of cancer that was residual measured 2 mm in greatest dimension.  There was a 1 mm focus of metastatic cancer in the left fallopian tube.  All other foci appeared necrotic with hemorrhagic nodules with no viable tissue remaining.  Chemotherapy response score was 3 with near complete response.  Since surgery she has done well with no specific complaints.  Current Meds:  Outpatient Encounter Medications as of 01/25/2020  Medication Sig  . cetirizine (ZYRTEC) 10 MG chewable tablet Chew 10 mg by mouth daily.  . Cholecalciferol 1000 UNITS capsule Take 2,000 Units by mouth daily.  Marland Kitchen ELIQUIS 5 MG TABS tablet Take 1 tablet (5 mg total) by mouth 2 (two) times daily.  Marland Kitchen gabapentin (NEURONTIN) 300 MG capsule Take 300 mg by mouth 3 (three) times daily.  . Melatonin 10 MG TABS Take 10 mg by mouth at bedtime.  . pantoprazole (PROTONIX) 40 MG tablet Take 1 tablet (40 mg total) by mouth daily.   . vitamin B-12 (CYANOCOBALAMIN) 1000 MCG tablet Take 1,000 mcg by mouth daily.  Marland Kitchen dexamethasone (DECADRON) 4 MG tablet Take 2 tabs at the night before and 2 tab the morning of chemotherapy, every 3 weeks, by mouth x 6 cycles (Patient not taking: Reported on 01/24/2020)  . ondansetron (ZOFRAN) 8 MG tablet Take by mouth every 8 (eight) hours as needed for nausea or vomiting. (Patient not taking: Reported on 01/24/2020)  . prochlorperazine (COMPAZINE) 10 MG tablet Take 1 tablet (10 mg total) by mouth every 6 (six) hours as needed (Nausea or vomiting). (Patient not taking: Reported on 01/24/2020)  . [DISCONTINUED] erythromycin base (E-MYCIN) 500 MG tablet Take 2 tablets (1000 mg total) at 2pm, 3pm, and 10 pm the day before surgery  . [DISCONTINUED] ibuprofen (ADVIL) 600 MG tablet Take 1 tablet (600 mg total) by mouth every 6 (six) hours as needed for moderate pain. For AFTER surgery only  . [DISCONTINUED] neomycin (MYCIFRADIN) 500 MG tablet Take 2 tablets (1000 mg total) at 2pm, 3pm, and 10 pm the day before surgery  . [DISCONTINUED] oxyCODONE (OXY IR/ROXICODONE) 5 MG immediate release tablet Take 1 tablet (5 mg total) by mouth every  4 (four) hours as needed for severe pain. For AFTER surgery, do not take and drive  . [DISCONTINUED] senna-docusate (SENOKOT-S) 8.6-50 MG tablet Take 2 tablets by mouth at bedtime. For AFTER surgery, do not take if having diarrhea   No facility-administered encounter medications on file as of 01/25/2020.    Allergy: No Known Allergies  Social Hx:   Social History   Socioeconomic History  . Marital status: Married    Spouse name: Kristina Flores  . Number of children: 3  . Years of education: College  . Highest education level: Not on file  Occupational History  . Occupation: PRESCHOOL Product manager: [employer]  Tobacco Use  . Smoking status: Never Smoker  . Smokeless tobacco: Never Used  Vaping Use  . Vaping Use: Never used  Substance and Sexual Activity  .  Alcohol use: Not Currently    Comment: occasional  . Drug use: No  . Sexual activity: Yes    Partners: Male    Birth control/protection: Post-menopausal  Other Topics Concern  . Not on file  Social History Narrative   Patient is married Kristina Flores) and lives at home with her husband. Married x 36 years.   Patient has three adult children; 2 in [place] (30, 94); one in [place] (25).  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   Patient has a college education.   Patient is right-handed.   Patient drinks one cup of coffee daily.   Pillates three times per week for sixteen years.     Social Determinants of Health   Financial Resource Strain: Not on file  Food Insecurity: Not on file  Transportation Needs: Not on file  Physical Activity: Not on file  Stress: Not on file  Social Connections: Not on file  Intimate Partner Violence: Not on file    Past Surgical Hx:  Past Surgical History:  Procedure Laterality Date  . fibroid in 2011,2003    . miscarriage 1989    . ovarian cyst 1992    . Robotic-assisted laparoscopic total hysterectomy with bilateral salpingoophorectomy, omentectomy, radical tumor debulking   01/10/2020    Past Medical Hx:  Past Medical History:  Diagnosis Date  . Allergy   . Anemia   . Cancer ([place])    ovarian  . DVT (deep venous thrombosis) ([place])   . Heart murmur    benign never an issue  . Numbness and tingling   . Osteopenia 08/2016   T score -1.6 FRAX 7.8%/0.7%  . Peripheral neuropathy, idiopathic   . Pulmonary embolism ([facility])     Past Gynecological History:  SVD x 3 No LMP recorded. Patient is postmenopausal.  Family Hx:  Family History  Problem Relation Age of Onset  . Cancer Mother 47       leukemia  . Colon cancer Father 89  . Cancer Father        prostate cancer, colon cancer  . Arthritis Father   . Colon cancer Paternal Uncle   . Esophageal cancer Neg Hx   . Rectal  cancer Neg Hx   . Stomach cancer Neg Hx     Review of Systems:  Constitutional  Feels fatigued ENT Normal appearing ears and nares bilaterally Skin/Breast  No rash, sores, jaundice, itching, dryness Cardiovascular  No chest pain, shortness of breath, or edema  Pulmonary  No cough or wheeze.  Gastro Intestinal  resolved bloating and early satiety, Genito Urinary  No frequency, urgency, dysuria, no bleeding Musculo Skeletal  No myalgia, arthralgia, joint swelling or pain  Neurologic  No weakness, numbness, change in gait,  Psychology  No depression, anxiety, insomnia.   Vitals:  Blood pressure (S) (!) 164/99, pulse 71, temperature (!) 97.3 F (36.3 C), temperature source Tympanic, resp. rate 18, weight 153 lb 11.2 oz (69.7 kg), SpO2 100 %.  Physical Exam: WD in NAD Neck  Supple NROM, without any enlargements.  Lymph Node Survey No cervical supraclavicular or inguinal adenopathy Cardiovascular  Well perfused peripheries Lungs  No increased WOB Skin  No rash/lesions/breakdown  Psychiatry  Alert and oriented to person, place, and time  Abdomen  Normoactive bowel sounds, abdomen soft, non-tender and nonobese without evidence of hernia. Incisions well healed. Back No CVA tenderness Genito Urinary  Vaginal cuff smooth, in tact, no active bleeding, sutures present, no cuff separation. Rectal  Good tone, no mass as previously palpated.  no cul de sac nodularity.  Extremities  No bilateral cyanosis, clubbing or edema.  30 minutes of direct face to face counseling time was spent with the patient. This included discussion about prognosis, therapy recommendations and postoperative side effects and are beyond the scope of routine postoperative care.  Thereasa Solo, MD  01/25/2020, 4:36 PM

## 2020-01-24 ENCOUNTER — Encounter: Payer: Self-pay | Admitting: Gynecologic Oncology

## 2020-01-25 ENCOUNTER — Encounter: Payer: Self-pay | Admitting: Oncology

## 2020-01-25 ENCOUNTER — Encounter: Payer: Self-pay | Admitting: Gynecologic Oncology

## 2020-01-25 ENCOUNTER — Other Ambulatory Visit: Payer: Self-pay

## 2020-01-25 ENCOUNTER — Inpatient Hospital Stay: Payer: 59 | Attending: Hematology and Oncology | Admitting: Gynecologic Oncology

## 2020-01-25 VITALS — BP 164/99 | HR 71 | Temp 97.3°F | Resp 18 | Wt 153.7 lb

## 2020-01-25 DIAGNOSIS — C786 Secondary malignant neoplasm of retroperitoneum and peritoneum: Secondary | ICD-10-CM | POA: Insufficient documentation

## 2020-01-25 DIAGNOSIS — Z7189 Other specified counseling: Secondary | ICD-10-CM

## 2020-01-25 DIAGNOSIS — Z90722 Acquired absence of ovaries, bilateral: Secondary | ICD-10-CM | POA: Diagnosis not present

## 2020-01-25 DIAGNOSIS — C569 Malignant neoplasm of unspecified ovary: Secondary | ICD-10-CM | POA: Diagnosis not present

## 2020-01-25 DIAGNOSIS — Z90710 Acquired absence of both cervix and uterus: Secondary | ICD-10-CM | POA: Insufficient documentation

## 2020-01-25 NOTE — Patient Instructions (Signed)
Dr Denman George is clearing you to restart chemotherapy and we will facilitate setting up these appointments. Her office will facilitate having your IVC filter removed and genetic testing being performed.  She will see you again in approximately 3-6 months after you have completed chemotherapy.

## 2020-01-26 ENCOUNTER — Other Ambulatory Visit: Payer: Self-pay | Admitting: Gynecologic Oncology

## 2020-01-26 DIAGNOSIS — C569 Malignant neoplasm of unspecified ovary: Secondary | ICD-10-CM

## 2020-01-27 ENCOUNTER — Telehealth: Payer: Self-pay | Admitting: Hematology and Oncology

## 2020-01-27 ENCOUNTER — Encounter: Payer: Self-pay | Admitting: Gynecologic Oncology

## 2020-01-27 NOTE — Telephone Encounter (Signed)
Scheduled appt per 1/12 sch msg - no answer, left message for patient with appt date and time

## 2020-02-01 ENCOUNTER — Telehealth: Payer: Self-pay | Admitting: Genetic Counselor

## 2020-02-01 ENCOUNTER — Encounter: Payer: 59 | Admitting: Family Medicine

## 2020-02-01 NOTE — Telephone Encounter (Signed)
Contacted patient to verify mychart video visit for pre reg 

## 2020-02-02 ENCOUNTER — Other Ambulatory Visit: Payer: Self-pay | Admitting: Genetic Counselor

## 2020-02-02 ENCOUNTER — Inpatient Hospital Stay (HOSPITAL_BASED_OUTPATIENT_CLINIC_OR_DEPARTMENT_OTHER): Payer: 59 | Admitting: Genetic Counselor

## 2020-02-02 DIAGNOSIS — Z800 Family history of malignant neoplasm of digestive organs: Secondary | ICD-10-CM

## 2020-02-02 DIAGNOSIS — Z803 Family history of malignant neoplasm of breast: Secondary | ICD-10-CM | POA: Diagnosis not present

## 2020-02-02 DIAGNOSIS — C569 Malignant neoplasm of unspecified ovary: Secondary | ICD-10-CM

## 2020-02-02 DIAGNOSIS — Z806 Family history of leukemia: Secondary | ICD-10-CM

## 2020-02-03 ENCOUNTER — Other Ambulatory Visit: Payer: 59

## 2020-02-03 ENCOUNTER — Encounter: Payer: Self-pay | Admitting: Genetic Counselor

## 2020-02-03 DIAGNOSIS — Z803 Family history of malignant neoplasm of breast: Secondary | ICD-10-CM | POA: Insufficient documentation

## 2020-02-03 DIAGNOSIS — Z806 Family history of leukemia: Secondary | ICD-10-CM | POA: Insufficient documentation

## 2020-02-03 DIAGNOSIS — Z800 Family history of malignant neoplasm of digestive organs: Secondary | ICD-10-CM | POA: Insufficient documentation

## 2020-02-03 NOTE — Progress Notes (Signed)
REFERRING PROVIDER: Dorothyann Gibbs, NP [place],  [place] [address]  PRIMARY PROVIDER:  Marton Redwood, MD  PRIMARY REASON FOR VISIT:  1. Malignant neoplasm of ovary, unspecified laterality ([place])   2. Family history of breast cancer   3. Family history of colon cancer   4. Family history of leukemia     I connected with Kristina Flores on 02/02/2020 at [place] EDT by [place] video conference and verified that I am speaking with the correct person using two identifiers.   Patient location: Home Provider location: [place]:   Kristina Flores, a 59 y.o. female, was seen for a [facility] cancer genetics consultation at the request of Joylene John, NP due to a personal and family history of cancer.  Kristina Flores presents to clinic today with her husband to discuss the possibility of a hereditary predisposition to cancer, to discuss genetic testing, and to further clarify her future cancer risks, as well as potential cancer risks for family members.   In 2021, at the age of 52, Kristina Flores was diagnosed with endometrioid adenocarcinoma of the right ovary. The treatment plan included chemotherapy and robotic assisted total hysterectomy, BSO, omentectomy.    CANCER HISTORY:  Oncology History  Ovarian cancer ([place])  09/20/2019 Initial Diagnosis   Presented to her primary care doctor with abdominal bloating.  Abdominal x-ray imaging was unremarkable.   09/29/2019 Imaging   CT of the chest showed pulmonary embolism in all lobes.  Large right/central pelvic mass likely ovarian neoplasm with omental/peritoneal metastasis with significant free fluid in the abdomen and pelvis.  A small left uterine calcification could be seen in a degenerating fibroid.  Small right and left lung base nodules, could represent sequela of previous infection or inflammation with metastasis not excluded.   09/29/2019 - 10/15/2019 Hospital Admission   She presented to the emergency department in [name]  after complaints of leg pain with bilateral lower extremity edema.  D-dimer was elevated.  Lower extremity ultrasound demonstrated extensive bilateral DVT involving peroneal and soleal calf veins on the left and peroneal vein on the right.  CT abdomen and pelvis showed large pelvic mass with omental metastasis and ascites, worrisome for ovarian cancer.  CT imaging of the chest showed bilateral pulmonary nodules as well as evidence of pulmonary embolism.  She received anticoagulation therapy.  She was also found to be anemic, worrisome that she might have bleeding in the tumor and received transfusion.  Echocardiogram showed preserved ejection fraction but evidence of moderate right ventricular strain.  Subsequent biopsy showed cancer suspicious for ovarian primary and she received first cycle of neoadjuvant chemotherapy with carboplatin and taxol. She had IVC filter placement and temporary TPN   09/30/2019 Procedure   Ultrasound paracentesis was performed complicated by bleeding and received blood transfusion   09/30/2019 Procedure   She had IVC filter placement by interventional radiologist in [facility].   09/30/2019 Echocardiogram   Outside echocardiogram showed left ventricular ejection fraction around 79%.  Concentric left ventricular modeling.  Grade 1 diastolic pattern with normal left atrial pressure.  Right ventricle size is mildly enlarged.  Right ventricular systolic function is moderately reduced.  Right ventricle was not well visualized.  Aortic sclerosis without evidence of stenosis.  Moderate pleural effusion on the left region   10/02/2019 Procedure   EGD was performed which showed no evidence of acute bleeding.  Biopsy of the stomach showed benign gastric mucosa with reactive gastropathy as may be seen  in the healing phase of erosive gastritis.  Helicobacter organism testing is negative..   10/03/2019 Procedure   She underwent CT-guided biopsy of the pelvic mass.   10/03/2019 Tumor Marker    CEA level was 2.9.   10/03/2019 Pathology Results   Biopsy show adenocarcinoma.  The malignant cells stain positive for keratin AE1/AE3, CK7 and MOC-31.  Rare cells show weak reactivity with p63 and GA TA 3.  Cells are negative for CK20, calretinin, CDX2, TTF-1, ER and WT-1.  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  Possible consideration include, but not limited to, gynecological origin, pancreatico biliary and urothelial.   10/03/2019 Tumor Marker   Patient's tumor was tested for the following markers: CA-125 Results of the tumor marker test revealed 713 CA19-9 is 100   10/04/2019 Imaging   Tagged red blood cell scan equivocal for source of bleeding.   10/05/2019 Procedure   She underwent paracentesis.  IR removed 4100 cc of ascites fluid with significant blood noted.   10/05/2019 Imaging   CT abdomen and pelvis angiogram protocol show no obvious source of bleeding.   10/05/2019 Imaging   CT imaging of the abdomen and pelvis showed no evidence of postprocedural hemorrhage or discrete area of GI bleeding.  Large volume ascites is present.  Small pockets of intraperitoneal free air are present, likely related to recent paracentesis.  Abnormal mass in the pelvic region most likely a neoplasm of uterine or ovarian origin.  Normal liver with benign cysts of the right hepatic dome.  Biliary tree and pancreas and spleen were age-appropriate   10/07/2019 Procedure   Ultrasound paracentesis was performed.   10/07/2019 -  Chemotherapy   The patient had neoadjuvant carboplatin and taxol for chemotherapy treatment.     10/08/2019 Imaging   CT angiogram of the abdominal aorta and iliofemoral test was done showing 14 x 10 x 14 cm pelvic mass with mild to moderate ascites.  Normal appearance of arterial structures without evidence of compression.  No evidence of compression of the iliac vein or IVC.   10/11/2019 Imaging    Ultrasound shows gallbladder full of sludge.  Fatty liver changes.  Ascites present.   10/18/2019 Cancer Staging   Staging form: Ovary, Fallopian Tube, and Primary Peritoneal Carcinoma, AJCC 8th Edition - Clinical stage from 10/18/2019: FIGO Stage IIIC, calculated as Stage Unknown (cT3c, cNX, cM0) - Signed by Heath Lark, MD on 10/18/2019   10/21/2019 Tumor Marker   Patient's tumor was tested for the following markers: CA-125. Results of the tumor marker test revealed 257.   10/31/2019 -  Chemotherapy   The patient had carboplatin and taxol for chemotherapy treatment.     11/22/2019 Tumor Marker   Patient's tumor was tested for the following markers: CA-125. Results of the tumor marker test revealed 30.7   12/12/2019 Imaging   1. Today's study demonstrates a positive response to therapy with regression of right ovarian lesion(s), and decreased volume of presumably malignant ascites. 2. No definitive evidence of metastatic disease in the thorax. Multiple tiny 2-4 mm pulmonary nodules are stable compared to prior examinations. Continued attention on follow-up studies is recommended to exclude the possibility of metastatic lesions    12/26/2019 Tumor Marker   Patient's tumor was tested for the following markers: CA-125 Results of the tumor marker test revealed 16.4     RISK FACTORS:  Menarche was at age 75.  First live birth at age 38.  OCP use  for approximately 10 years.  Ovaries intact: no; see history noted above Hysterectomy: yes; see history noted above Menopausal status: postmenopausal HRT use: 0 years. Colonoscopy: yes; most recent in August 2020. Mammogram within the last year: yes. Number of breast biopsies: 0. Up to date with pelvic exams: yes. Any excessive radiation exposure in the past: no  Past Medical History:  Diagnosis Date   Allergy    Anemia    Cancer ([place])    ovarian   DVT (deep venous thrombosis) ([facility])    Heart murmur    benign never an issue    Numbness and tingling    Osteopenia 08/2016   T score -1.6 FRAX 7.8%/0.7%   Peripheral neuropathy, idiopathic    Pulmonary embolism [facility])     Past Surgical History:  Procedure Laterality Date   fibroid in 2011,2003     miscarriage 1989     ovarian cyst 1992     Robotic-assisted laparoscopic total hysterectomy with bilateral salpingoophorectomy, omentectomy, radical tumor debulking   01/10/2020    Social History   Socioeconomic History   Marital status: Married    Spouse name: [name]   Number of children: 3   Years of education: [employer] education level: Not on file  Occupational History   Occupation: PRESCHOOL TEACHER    Employer: [employer]  Tobacco Use   Smoking status: Never Smoker   Smokeless tobacco: Never Used  Scientific laboratory technician Use: Never used  Substance and Sexual Activity   Alcohol use: Not Currently    Comment: occasional   Drug use: No   Sexual activity: Yes    Partners: Male    Birth control/protection: Post-menopausal  Other Topics Concern   Not on file  Social History Narrative   Patient is married [name]) and lives at home with her husband. Married x 36 years.   Patient has three adult children; 2 in [place] (30, 58); one in [place] (25).  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   Patient has a college education.   Patient is right-handed.   Patient drinks one cup of coffee daily.   Pillates three times per week for sixteen years.     Social Determinants of Health   Financial Resource Strain: Not on file  Food Insecurity: Not on file  Transportation Needs: Not on file  Physical Activity: Not on file  Stress: Not on file  Social Connections: Not on file     FAMILY HISTORY:  We obtained a detailed, 4-generation family history.  Significant diagnoses are listed below: Family History  Problem Relation Age of Onset   Leukemia Mother 31    Colon cancer Father 80   Prostate cancer Father 89   Breast cancer Maternal Grandmother 23   Leukemia Maternal Grandfather 29   Colon cancer Paternal Uncle 50   Colon cancer Cousin 68       paternal female cousin   Throat cancer Cousin 51       paternal female cousin   Prostate cancer Cousin        paternal cousin; dx early 38s; metastatic    Kristina Flores has two daughters and one son, all without a cancer history.  She has identical twin brothers who are 58 years old and do not have cancer.   Ms. Vink's mother was diagnosed with leukemia at age 73 and passed away at age 36.  Kristina Flores's maternal  grandmother had breast cancer diagnosed at age 47.  Her maternal grandfather was diagnosed with leukemia at age 82.  No other maternal family history of cancer was reported.   Kristina Flores's fater is living at age 92.  He has a history of colon cancer at age 76 and prostate cancer at age 21.  Kristina Flores has a paternal uncle with colon cancer diagnosed at 4, a paternal cousin with colon cancer diagnosed at 14, a paternal cousin diagnosed with metastastic prostate cancer in his early 2s, and a paternal cousin with throat cancer diagnosed at age 54. No other paternal family history of cancer was reported.   Kristina Flores is unaware of previous family history of genetic testing for hereditary cancer risks. Patient's maternal ancestors are of [place] and [place] descent, and paternal ancestors are of [place] descent. There is no reported Ashkenazi Jewish ancestry. There is no known consanguinity.  GENETIC COUNSELING ASSESSMENT: Kristina Flores is a 60 y.o. female with a personal history of ovarian cancer and family history of breast and colon cancer which is somewhat suggestive of a hereditary cancer syndrome and predisposition to cancer given the presence of related cancers in both her maternal and paternal family. We, therefore, discussed and recommended the following at today's visit.   DISCUSSION: We  discussed that 5 - 10% of cancer is hereditary, with most cases of hereditary ovarian cancer associated with mutations in BRCA1/2.  There are other genes that can be associated with hereditary ovarian cancer syndromes.  There are also genes, such as MLH1, MSH2, MSH6, and PMS2, which are associated with hereditary colon cancer syndromes.  Type of cancer risk and level of risk are gene-specific.  We discussed that testing is beneficial for several reasons including knowing how to follow individuals after completing their treatment, identifying whether potential treatment options such as PARP inhibitors would be beneficial, and understanding if other family members could be at risk for cancer and allowing them to undergo genetic testing.   We reviewed the characteristics, features and inheritance patterns of hereditary cancer syndromes. We also discussed genetic testing, including the appropriate family members to test, the process of testing, insurance coverage and turn-around-time for results.  s.  HRD testing is performed in tandem with genetic testing, and typically at no additional cost.  We discussed the implications of a negative, positive, carrier and/or variant of uncertain significant result. Furhtermore, we discussed that homologous recombination testing (HRD) is genetic testing performed on the tumor that could influence her management, such as eligibility for targeted therapies. We recommended Kristina Flores pursue genetic testing for a panel that includes genes associated with breast, ovarian, and colon cancers in addition to HRD testing.  TumorNext-HRD+CancerNext through [employer] was ordered on behalf of Kristina Flores.  We discussed the methodology, benefits, and limitations of this test as well as differences between tumor and germline analysis. The CancerNext gene panel offered by [employer] includes sequencing and rearrangement analysis for the following 37 genes:   APC, ATM, AXIN2 BARD1,  BMPR1A, BRCA1, BRCA2, BRIP1, CDH1, CDK4, CDKN2A, CHEK2, DICER1, HOXB13, EPCAM, GREM1, MLH1, MRE11A, MSH2, MSH3, MSH6, MUTYH, NBN, NF1, PALB2, PMS2, POLD1, POLE, PTEN, RAD50, RAD51C, RAD51D, RECQL, SMAD4, SMARCA4, STK11, and TP53. TumorNext-HRD is a paired tumor and germline analysis of BRCA1 and BRCA2 plus 9 additional genes in the homologous recombination repair pathway (ATM, BARD1, BRCA1, BRCA2, BRIP1, CHEK2, MRE11A, NBN, PALB2, RAD51C, RAD51D).   Based on Kristina Flores's personal history of ovarian cancer and family history of colon  and breast cancer, she meets medical criteria for genetic testing for both Hereditary Breast and Ovarian Cancer Syndrome and Lynch syndrome. Despite that she meets criteria, she may still have an out of pocket cost.   PLAN: After considering the risks, benefits, and limitations, Kristina Flores provided informed consent to pursue genetic testing.  A blood sample will be collected on February 07, 2020 and will be sent to [facility] for analysis of the TumorNext-HRD+CancerNext. Results should be available within approximately 4 weeks' time, at which point they will be disclosed by telephone to Ms. Wender, as will any additional recommendations warranted by these results. Kristina Flores will receive a summary of her genetic counseling visit and a copy of her results once available. This information will also be available in Epic.   Lastly, we encouraged Ms. Uncapher to remain in contact with cancer genetics annually so that we can continuously update the family history and inform her of any changes in cancer genetics and testing that may be of benefit for this family.   Ms. Stapel's questions were answered to her satisfaction today. Our contact information was provided should additional questions or concerns arise. Thank you for the referral and allowing [place] to share in the care of your patient.   Randeep Biondolillo M. Joette Catching, [place], [facility] Genetic Counselor [email or web address] (P)  [phone]  The patient was seen for a total of 35 minutes in face-to-face genetic counseling.  Drs. Magrinat, Lindi Adie and/or Burr Medico were available to discuss this case as needed.    _______________________________________________________________________ For Office Staff:  Number of people involved in session: 1 Was an Intern/ student involved with case: no

## 2020-02-07 ENCOUNTER — Telehealth: Payer: Self-pay | Admitting: Hematology and Oncology

## 2020-02-07 ENCOUNTER — Inpatient Hospital Stay: Payer: 59

## 2020-02-07 ENCOUNTER — Inpatient Hospital Stay (HOSPITAL_BASED_OUTPATIENT_CLINIC_OR_DEPARTMENT_OTHER): Payer: 59 | Admitting: Hematology and Oncology

## 2020-02-07 ENCOUNTER — Other Ambulatory Visit: Payer: Self-pay | Admitting: Hematology and Oncology

## 2020-02-07 ENCOUNTER — Other Ambulatory Visit: Payer: Self-pay

## 2020-02-07 ENCOUNTER — Encounter: Payer: Self-pay | Admitting: Hematology and Oncology

## 2020-02-07 VITALS — BP 158/91 | HR 67 | Temp 97.5°F | Resp 18 | Ht 64.0 in | Wt 156.8 lb

## 2020-02-07 DIAGNOSIS — I2699 Other pulmonary embolism without acute cor pulmonale: Secondary | ICD-10-CM

## 2020-02-07 DIAGNOSIS — Z800 Family history of malignant neoplasm of digestive organs: Secondary | ICD-10-CM

## 2020-02-07 DIAGNOSIS — R918 Other nonspecific abnormal finding of lung field: Secondary | ICD-10-CM

## 2020-02-07 DIAGNOSIS — D61818 Other pancytopenia: Secondary | ICD-10-CM | POA: Diagnosis not present

## 2020-02-07 DIAGNOSIS — G609 Hereditary and idiopathic neuropathy, unspecified: Secondary | ICD-10-CM | POA: Diagnosis not present

## 2020-02-07 DIAGNOSIS — C569 Malignant neoplasm of unspecified ovary: Secondary | ICD-10-CM | POA: Diagnosis not present

## 2020-02-07 DIAGNOSIS — Z95828 Presence of other vascular implants and grafts: Secondary | ICD-10-CM

## 2020-02-07 DIAGNOSIS — Z90710 Acquired absence of both cervix and uterus: Secondary | ICD-10-CM | POA: Diagnosis not present

## 2020-02-07 DIAGNOSIS — C786 Secondary malignant neoplasm of retroperitoneum and peritoneum: Secondary | ICD-10-CM | POA: Diagnosis not present

## 2020-02-07 DIAGNOSIS — Z803 Family history of malignant neoplasm of breast: Secondary | ICD-10-CM

## 2020-02-07 DIAGNOSIS — Z90722 Acquired absence of ovaries, bilateral: Secondary | ICD-10-CM | POA: Diagnosis not present

## 2020-02-07 DIAGNOSIS — I824Z3 Acute embolism and thrombosis of unspecified deep veins of distal lower extremity, bilateral: Secondary | ICD-10-CM

## 2020-02-07 DIAGNOSIS — R180 Malignant ascites: Secondary | ICD-10-CM

## 2020-02-07 LAB — CBC WITH DIFFERENTIAL/PLATELET
Abs Immature Granulocytes: 0.01 10*3/uL (ref 0.00–0.07)
Basophils Absolute: 0 10*3/uL (ref 0.0–0.1)
Basophils Relative: 1 %
Eosinophils Absolute: 0.5 10*3/uL (ref 0.0–0.5)
Eosinophils Relative: 15 %
HCT: 30.6 % — ABNORMAL LOW (ref 36.0–46.0)
Hemoglobin: 10.4 g/dL — ABNORMAL LOW (ref 12.0–15.0)
Immature Granulocytes: 0 %
Lymphocytes Relative: 32 %
Lymphs Abs: 1.1 10*3/uL (ref 0.7–4.0)
MCH: 33.9 pg (ref 26.0–34.0)
MCHC: 34 g/dL (ref 30.0–36.0)
MCV: 99.7 fL (ref 80.0–100.0)
Monocytes Absolute: 0.3 10*3/uL (ref 0.1–1.0)
Monocytes Relative: 10 %
Neutro Abs: 1.4 10*3/uL — ABNORMAL LOW (ref 1.7–7.7)
Neutrophils Relative %: 42 %
Platelets: 266 10*3/uL (ref 150–400)
RBC: 3.07 MIL/uL — ABNORMAL LOW (ref 3.87–5.11)
RDW: 14.2 % (ref 11.5–15.5)
WBC: 3.4 10*3/uL — ABNORMAL LOW (ref 4.0–10.5)
nRBC: 0 % (ref 0.0–0.2)

## 2020-02-07 LAB — COMPREHENSIVE METABOLIC PANEL
ALT: 12 U/L (ref 0–44)
AST: 16 U/L (ref 15–41)
Albumin: 3.9 g/dL (ref 3.5–5.0)
Alkaline Phosphatase: 80 U/L (ref 38–126)
Anion gap: 7 (ref 5–15)
BUN: 17 mg/dL (ref 8–23)
CO2: 29 mmol/L (ref 22–32)
Calcium: 9.3 mg/dL (ref 8.9–10.3)
Chloride: 105 mmol/L (ref 98–111)
Creatinine, Ser: 0.93 mg/dL (ref 0.44–1.00)
GFR, Estimated: >60 mL/min (ref >60)
Glucose, Bld: 82 mg/dL (ref 70–99)
Potassium: 3.7 mmol/L (ref 3.5–5.1)
Sodium: 141 mmol/L (ref 135–145)
Total Bilirubin: 0.3 mg/dL (ref 0.3–1.2)
Total Protein: 7.5 g/dL (ref 6.5–8.1)

## 2020-02-07 LAB — SAMPLE TO BLOOD BANK

## 2020-02-07 LAB — GENETIC SCREENING ORDER

## 2020-02-07 MED ORDER — DEXAMETHASONE 4 MG PO TABS: ORAL_TABLET | ORAL | 0 refills | Status: DC

## 2020-02-07 MED FILL — DEXAMETHASONE 4 MG TABLET: 4 | 21 days supply | Qty: 8 | Fill #0

## 2020-02-07 NOTE — Progress Notes (Signed)
[place] OFFICE PROGRESS NOTE  Patient Care Team: Marton Redwood, MD as PCP - General (Internal Medicine) Awanda Mink Craige Cotta, RN as Oncology Nurse Navigator (Oncology)  ASSESSMENT & PLAN:  Ovarian cancer [facility]) She had excellent response to neoadjuvant chemotherapy Her surgical specimen reviewed extensive necrosis Additional genetic testing is pending Her wound is well-healed Her neuropathy is slightly improved We will proceed with chemotherapy as scheduled tomorrow, and 2 additional cycles before stopping I have made some adjustment to her treatment plan given recent weight changes  Pancytopenia, acquired [facility]) This is due to her recent treatment She will continue oral vitamin B12 supplement We will proceed with treatment without delay  Acute pulmonary embolism ([place]) She will continue anticoagulation therapy for at least a year from the date of diagnosis, until the end of the year  Idiopathic neuropathy This is stable/improved since her recent treatment break She will continue close observation for now and to take gabapentin as prescribed  S/P IVC filter She has appointment to see interventional radiologist to consider retrieval There is no contraindication for her to proceed   No orders of the defined types were placed in this encounter.   All questions were answered. The patient knows to call the clinic with any problems, questions or concerns. The total time spent in the appointment was 30 minutes encounter with patients including review of chart and various tests results, discussions about plan of care and coordination of care plan   Heath Lark, MD 02/07/2020 12:45 PM  INTERVAL HISTORY: Please see below for problem oriented charting. She returns with her husband for further follow-up and resumption of chemotherapy after recent interval debulking surgery She is doing very well Her appetite is good She has normal bowel movement No recent bleeding  complications from anticoagulation therapy Her neuropathy has slightly improved No recent infection, fever or chills  SUMMARY OF ONCOLOGIC HISTORY: Oncology History  Ovarian cancer ([place])  09/20/2019 Initial Diagnosis   Presented to her primary care doctor with abdominal bloating.  Abdominal x-ray imaging was unremarkable.   09/29/2019 Imaging   CT of the chest showed pulmonary embolism in all lobes.  Large right/central pelvic mass likely ovarian neoplasm with omental/peritoneal metastasis with significant free fluid in the abdomen and pelvis.  A small left uterine calcification could be seen in a degenerating fibroid.  Small right and left lung base nodules, could represent sequela of previous infection or inflammation with metastasis not excluded.   09/29/2019 - 10/15/2019 Hospital Admission   She presented to the emergency department in [name] after complaints of leg pain with bilateral lower extremity edema.  D-dimer was elevated.  Lower extremity ultrasound demonstrated extensive bilateral DVT involving peroneal and soleal calf veins on the left and peroneal vein on the right.  CT abdomen and pelvis showed large pelvic mass with omental metastasis and ascites, worrisome for ovarian cancer.  CT imaging of the chest showed bilateral pulmonary nodules as well as evidence of pulmonary embolism.  She received anticoagulation therapy.  She was also found to be anemic, worrisome that she might have bleeding in the tumor and received transfusion.  Echocardiogram showed preserved ejection fraction but evidence of moderate right ventricular strain.  Subsequent biopsy showed cancer suspicious for ovarian primary and she received first cycle of neoadjuvant chemotherapy with carboplatin and taxol. She had IVC filter placement and temporary TPN   09/30/2019 Procedure   Ultrasound paracentesis was performed complicated by bleeding and received blood transfusion   09/30/2019 Procedure   She  had IVC filter placement  by interventional radiologist in [name].   09/30/2019 Echocardiogram   Outside echocardiogram showed left ventricular ejection fraction around 79%.  Concentric left ventricular modeling.  Grade 1 diastolic pattern with normal left atrial pressure.  Right ventricle size is mildly enlarged.  Right ventricular systolic function is moderately reduced.  Right ventricle was not well visualized.  Aortic sclerosis without evidence of stenosis.  Moderate pleural effusion on the left region   10/02/2019 Procedure   EGD was performed which showed no evidence of acute bleeding.  Biopsy of the stomach showed benign gastric mucosa with reactive gastropathy as may be seen in the healing phase of erosive gastritis.  Helicobacter organism testing is negative..   10/03/2019 Procedure   She underwent CT-guided biopsy of the pelvic mass.   10/03/2019 Tumor Marker   CEA level was 2.9.   10/03/2019 Pathology Results   Biopsy show adenocarcinoma.  The malignant cells stain positive for keratin AE1/AE3, CK7 and MOC-31.  Rare cells show weak reactivity with p63 and GA TA 3.  Cells are negative for CK20, calretinin, CDX2, TTF-1, ER and WT-1.  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  Possible consideration include, but not limited to, gynecological origin, pancreatico biliary and urothelial.   10/03/2019 Tumor Marker   Patient's tumor was tested for the following markers: CA-125 Results of the tumor marker test revealed 713 CA19-9 is 100   10/04/2019 Imaging   Tagged red blood cell scan equivocal for source of bleeding.   10/05/2019 Procedure   She underwent paracentesis.  IR removed 4100 cc of ascites fluid with significant blood noted.   10/05/2019 Imaging   CT abdomen and pelvis angiogram protocol show no obvious source of bleeding.   10/05/2019 Imaging   CT imaging of the abdomen and pelvis showed no evidence of postprocedural  hemorrhage or discrete area of GI bleeding.  Large volume ascites is present.  Small pockets of intraperitoneal free air are present, likely related to recent paracentesis.  Abnormal mass in the pelvic region most likely a neoplasm of uterine or ovarian origin.  Normal liver with benign cysts of the right hepatic dome.  Biliary tree and pancreas and spleen were age-appropriate   10/07/2019 Procedure   Ultrasound paracentesis was performed.   10/07/2019 -  Chemotherapy   The patient had neoadjuvant carboplatin and taxol for chemotherapy treatment.     10/08/2019 Imaging   CT angiogram of the abdominal aorta and iliofemoral test was done showing 14 x 10 x 14 cm pelvic mass with mild to moderate ascites.  Normal appearance of arterial structures without evidence of compression.  No evidence of compression of the iliac vein or IVC.   10/11/2019 Imaging   Ultrasound shows gallbladder full of sludge.  Fatty liver changes.  Ascites present.   10/18/2019 Cancer Staging   Staging form: Ovary, Fallopian Tube, and Primary Peritoneal Carcinoma, AJCC 8th Edition - Clinical stage from 10/18/2019: FIGO Stage IIIC, calculated as Stage Unknown (cT3c, cNX, cM0) - Signed by Heath Lark, MD on 10/18/2019   10/21/2019 Tumor Marker   Patient's tumor was tested for the following markers: CA-125. Results of the tumor marker test revealed 257.   10/31/2019 -  Chemotherapy   The patient had carboplatin and taxol for chemotherapy treatment.     11/22/2019 Tumor Marker   Patient's tumor was tested for the following markers: CA-125. Results of the tumor marker test revealed 30.7   12/12/2019  Imaging   1. Today's study demonstrates a positive response to therapy with regression of right ovarian lesion(s), and decreased volume of presumably malignant ascites. 2. No definitive evidence of metastatic disease in the thorax. Multiple tiny 2-4 mm pulmonary nodules are stable compared to prior examinations. Continued attention  on follow-up studies is recommended to exclude the possibility of metastatic lesions    12/26/2019 Tumor Marker   Patient's tumor was tested for the following markers: CA-125 Results of the tumor marker test revealed 16.4   01/10/2020 Surgery   Surgeon: Donaciano Eva    Assistants: Dr Lahoma Crocker (an MD assistant was necessary for tissue manipulation, management of robotic instrumentation, retraction and positioning due to the complexity of the case and hospital policies).  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    Post-operative Diagnosis: same   Operation: Robotic-assisted laparoscopic total hysterectomy with bilateral salpingoophorectomy, omentectomy, radical tumor debulking     Operative Findings:  : bulky right ovarian mass (approximately 6cm), mild nodularity to left ovary, tumor nodules on peritoneum of pelvis (all resected), no gross omental disease. No gross residual tumor at the completion of the procedure representing a complete (optimal) cytoreduction   01/10/2020 Pathology Results   Clinical History: Ovarian cancer ([facility])   FINAL MICROSCOPIC DIAGNOSIS:   A. UTERUS, CERVIX, BILATERAL TUBES AND OVARIES:  - Right ovary:       Microscopic foci of adenocarcinoma associated with extensive necrosis and hemosiderin deposition.       No ovarian surface involvement identified.       Paraovarian necrotic and hemorrhagic nodule.       See oncology table and comment.  - Left Fallopian tube:       Microscopic focus of adenocarcinoma involving submucosa.       See comment.  - Cervix:       Nabothian cyst.       No dysplasia or malignancy.  - Endometrium:       Inactive.       No hyperplasia or malignancy.  - Right Fallopian tube:       Adhesions with focal hemosiderin deposition.       No malignancy identified.  - Left ovary:       Endosalpingosis.       No  malignancy identified.   B. PERITONEAL NODULE, BIOPSY:  - Necrotic and hemorrhagic nodule with hemosiderin deposition.  - No viable malignancy identified.   C. OMENTUM:  - Foci of fibrosis associated with adhesions and hemosiderin deposition.  - No malignancy identified.  - One benign lymph node (0/1).   ONCOLOGY TABLE:  OVARY or FALLOPIAN TUBE or PRIMARY PERITONEUM: Resection  Procedure: Hysterectomy with bilateral salpingo-oophorectomy, peritoneal nodule biopsy and omentectomy.  Specimen Integrity: Intact.  Tumor Site: Right ovary.  Tumor Size: 5.2 x 4.5 x 3.8 cm, see comment.  Histologic Type: Endometrioid adenocarcinoma.  Histologic Grade: FIGO grade 2.  Ovarian Surface Involvement: Not identified.  Fallopian Tube Surface Involvement: Not identified.  See comment.  Other Tissue/ Organ Involvement: Not identified.  Peritoneal/Ascitic Fluid Involvement: Not applicable.  Chemotherapy Response Score (CRS): Near complete response, CRS 3.  See  comment.  Regional Lymph Nodes: No lymph nodes submitted.  Distant Metastasis:       Distant Site(s) Involved: Not identified.  Pathologic Stage Classification (pTNM, AJCC 8th Edition): ypT2a, ypNX  Ancillary Studies: Can be performed if requested.  Representative Tumor Block: A6 and A7.  REVIEW OF SYSTEMS:   Constitutional: Denies fevers, chills or abnormal weight loss Eyes: Denies blurriness of vision Ears, nose, mouth, throat, and face: Denies mucositis or sore throat Respiratory: Denies cough, dyspnea or wheezes Cardiovascular: Denies palpitation, chest discomfort or lower extremity swelling Gastrointestinal:  Denies nausea, heartburn or change in bowel habits Skin: Denies abnormal skin rashes Lymphatics: Denies new lymphadenopathy or easy bruising Behavioral/Psych: Mood is stable, no new changes  All other systems were reviewed with the patient and are negative.  I have reviewed the past medical history, past surgical  history, social history and family history with the patient and they are unchanged from previous note.  ALLERGIES:  has No Known Allergies.  MEDICATIONS:  Current Outpatient Medications  Medication Sig Dispense Refill  . cetirizine (ZYRTEC) 10 MG chewable tablet Chew 10 mg by mouth daily.    . Cholecalciferol 1000 UNITS capsule Take 2,000 Units by mouth daily.    Marland Kitchen dexamethasone (DECADRON) 4 MG tablet Take 2 tabs at the night before and 2 tab the morning of chemotherapy, every 3 weeks, for total of 2 more cycles, please dispense 8 pills 8 tablet 0  . ELIQUIS 5 MG TABS tablet Take 1 tablet (5 mg total) by mouth 2 (two) times daily. 60 tablet 11  . gabapentin (NEURONTIN) 300 MG capsule Take 300 mg by mouth 3 (three) times daily.    . Melatonin 10 MG TABS Take 10 mg by mouth at bedtime.    . ondansetron (ZOFRAN) 8 MG tablet Take by mouth every 8 (eight) hours as needed for nausea or vomiting. (Patient not taking: Reported on 01/24/2020)    . pantoprazole (PROTONIX) 40 MG tablet Take 1 tablet (40 mg total) by mouth daily. 90 tablet 1  . prochlorperazine (COMPAZINE) 10 MG tablet Take 1 tablet (10 mg total) by mouth every 6 (six) hours as needed (Nausea or vomiting). (Patient not taking: Reported on 01/24/2020) 30 tablet 1  . vitamin B-12 (CYANOCOBALAMIN) 1000 MCG tablet Take 1,000 mcg by mouth daily.     No current facility-administered medications for this visit.    PHYSICAL EXAMINATION: ECOG PERFORMANCE STATUS: 1 - Symptomatic but completely ambulatory  Vitals:   02/07/20 1025  BP: (!) 158/91  Pulse: 67  Resp: 18  Temp: (!) 97.5 F (36.4 C)  SpO2: 100%   Filed Weights   02/07/20 1025  Weight: 156 lb 12.8 oz (71.1 kg)    GENERAL:alert, no distress and comfortable SKIN: skin color, texture, turgor are normal, no rashes or significant lesions EYES: normal, Conjunctiva are pink and non-injected, sclera clear OROPHARYNX:no exudate, no erythema and lips, buccal mucosa, and tongue normal   NECK: supple, thyroid normal size, non-tender, without nodularity LYMPH:  no palpable lymphadenopathy in the cervical, axillary or inguinal LUNGS: clear to auscultation and percussion with normal breathing effort HEART: regular rate & rhythm and no murmurs and no lower extremity edema ABDOMEN:abdomen soft, non-tender and normal bowel sounds.  Noted well-healed surgical scars Musculoskeletal:no cyanosis of digits and no clubbing  NEURO: alert & oriented x 3 with fluent speech, no focal motor/sensory deficits  LABORATORY DATA:  I have reviewed the data as listed    Component Value Date/Time   NA 141 02/07/2020 1001   NA 142 02/28/2019 1529   K 3.7 02/07/2020 1001   CL 105 02/07/2020 1001   CO2 29 02/07/2020 1001   GLUCOSE 82 02/07/2020 1001   BUN 17 02/07/2020 1001   BUN 14 02/28/2019 1529   CREATININE 0.93 02/07/2020  1001   CREATININE 0.79 05/28/2019 1156   CALCIUM 9.3 02/07/2020 1001   PROT 7.5 02/07/2020 1001   PROT 6.7 02/28/2019 1529   ALBUMIN 3.9 02/07/2020 1001   ALBUMIN 4.3 02/28/2019 1529   AST 16 02/07/2020 1001   ALT 12 02/07/2020 1001   ALKPHOS 80 02/07/2020 1001   BILITOT 0.3 02/07/2020 1001   BILITOT <0.2 02/28/2019 1529   GFRNONAA >60 02/07/2020 1001   GFRNONAA 81 05/28/2019 1156   GFRAA 94 05/28/2019 1156    No results found for: SPEP, UPEP  Lab Results  Component Value Date   WBC 3.4 (L) 02/07/2020   NEUTROABS 1.4 (L) 02/07/2020   HGB 10.4 (L) 02/07/2020   HCT 30.6 (L) 02/07/2020   MCV 99.7 02/07/2020   PLT 266 02/07/2020      Chemistry      Component Value Date/Time   NA 141 02/07/2020 1001   NA 142 02/28/2019 1529   K 3.7 02/07/2020 1001   CL 105 02/07/2020 1001   CO2 29 02/07/2020 1001   BUN 17 02/07/2020 1001   BUN 14 02/28/2019 1529   CREATININE 0.93 02/07/2020 1001   CREATININE 0.79 05/28/2019 1156      Component Value Date/Time   CALCIUM 9.3 02/07/2020 1001   ALKPHOS 80 02/07/2020 1001   AST 16 02/07/2020 1001   ALT 12  02/07/2020 1001   BILITOT 0.3 02/07/2020 1001   BILITOT <0.2 02/28/2019 1529

## 2020-02-07 NOTE — Telephone Encounter (Signed)
Scheduled appt per 1/25 sch msg - pt to get an updated schedule next visit.

## 2020-02-07 NOTE — Assessment & Plan Note (Signed)
This is stable/improved since her recent treatment break She will continue close observation for now and to take gabapentin as prescribed

## 2020-02-07 NOTE — Assessment & Plan Note (Signed)
She had excellent response to neoadjuvant chemotherapy Her surgical specimen reviewed extensive necrosis Additional genetic testing is pending Her wound is well-healed Her neuropathy is slightly improved We will proceed with chemotherapy as scheduled tomorrow, and 2 additional cycles before stopping I have made some adjustment to her treatment plan given recent weight changes

## 2020-02-07 NOTE — Assessment & Plan Note (Signed)
She will continue anticoagulation therapy for at least a year from the date of diagnosis, until the end of the year 

## 2020-02-07 NOTE — Assessment & Plan Note (Signed)
She has appointment to see interventional radiologist to consider retrieval There is no contraindication for her to proceed

## 2020-02-07 NOTE — Assessment & Plan Note (Signed)
This is due to her recent treatment She will continue oral vitamin B12 supplement We will proceed with treatment without delay 

## 2020-02-08 ENCOUNTER — Inpatient Hospital Stay: Payer: 59

## 2020-02-08 ENCOUNTER — Other Ambulatory Visit: Payer: Self-pay

## 2020-02-08 ENCOUNTER — Telehealth: Payer: Self-pay

## 2020-02-08 DIAGNOSIS — C786 Secondary malignant neoplasm of retroperitoneum and peritoneum: Secondary | ICD-10-CM | POA: Diagnosis not present

## 2020-02-08 DIAGNOSIS — C569 Malignant neoplasm of unspecified ovary: Secondary | ICD-10-CM

## 2020-02-08 DIAGNOSIS — Z90710 Acquired absence of both cervix and uterus: Secondary | ICD-10-CM | POA: Diagnosis not present

## 2020-02-08 DIAGNOSIS — Z90722 Acquired absence of ovaries, bilateral: Secondary | ICD-10-CM | POA: Diagnosis not present

## 2020-02-08 LAB — CA 125: Cancer Antigen (CA) 125: 19.3 U/mL (ref 0.0–38.1)

## 2020-02-08 MED ORDER — PALONOSETRON HCL INJECTION 0.25 MG/5ML
0.2500 mg | Freq: Once | INTRAVENOUS | Status: AC
  Administered 2020-02-08: 0.25 mg via INTRAVENOUS

## 2020-02-08 MED ORDER — SODIUM CHLORIDE 0.9 % IV SOLN
620.0000 mg | Freq: Once | INTRAVENOUS | Status: AC
  Administered 2020-02-08: 620 mg via INTRAVENOUS
  Filled 2020-02-08: qty 62

## 2020-02-08 MED ORDER — DIPHENHYDRAMINE HCL 50 MG/ML IJ SOLN
INTRAMUSCULAR | Status: AC
  Filled 2020-02-08: qty 1

## 2020-02-08 MED ORDER — FAMOTIDINE IN NACL 20-0.9 MG/50ML-% IV SOLN
20.0000 mg | Freq: Once | INTRAVENOUS | Status: AC
  Administered 2020-02-08: 20 mg via INTRAVENOUS

## 2020-02-08 MED ORDER — DIPHENHYDRAMINE HCL 50 MG/ML IJ SOLN
12.5000 mg | Freq: Once | INTRAMUSCULAR | Status: AC
  Administered 2020-02-08: 12.5 mg via INTRAVENOUS

## 2020-02-08 MED ORDER — SODIUM CHLORIDE 0.9 % IV SOLN
140.0000 mg/m2 | Freq: Once | INTRAVENOUS | Status: AC
  Administered 2020-02-08: 252 mg via INTRAVENOUS
  Filled 2020-02-08: qty 42

## 2020-02-08 MED ORDER — SODIUM CHLORIDE 0.9 % IV SOLN
Freq: Once | INTRAVENOUS | Status: AC
  Filled 2020-02-08: qty 250

## 2020-02-08 MED ORDER — SODIUM CHLORIDE 0.9 % IV SOLN
10.0000 mg | Freq: Once | INTRAVENOUS | Status: AC
  Administered 2020-02-08: 10 mg via INTRAVENOUS
  Filled 2020-02-08: qty 10

## 2020-02-08 MED ORDER — FAMOTIDINE IN NACL 20-0.9 MG/50ML-% IV SOLN
INTRAVENOUS | Status: AC
  Filled 2020-02-08: qty 50

## 2020-02-08 MED ORDER — PALONOSETRON HCL INJECTION 0.25 MG/5ML
INTRAVENOUS | Status: AC
  Filled 2020-02-08: qty 5

## 2020-02-08 MED ORDER — SODIUM CHLORIDE 0.9 % IV SOLN
150.0000 mg | Freq: Once | INTRAVENOUS | Status: AC
  Administered 2020-02-08: 150 mg via INTRAVENOUS
  Filled 2020-02-08: qty 150

## 2020-02-08 NOTE — Progress Notes (Signed)
Per Dr. Alvy Bimler, ok to treat with neutropenia, ANC of 1.4.

## 2020-02-08 NOTE — Telephone Encounter (Signed)
-----   Message from Heath Lark, MD sent at 02/08/2020 12:59 PM EST ----- Regarding: CA-125 Pls let her know results are nromal

## 2020-02-08 NOTE — Telephone Encounter (Signed)
Pt made aware of most recent labs CA 125 ([facility])

## 2020-02-08 NOTE — Patient Instructions (Signed)
   [facility] Discharge Instructions for Patients Receiving Chemotherapy  Today you received the following chemotherapy agents Taxol and Carboplatin   To help prevent nausea and vomiting after your treatment, we encourage you to take your nausea medication as directed.    If you develop nausea and vomiting that is not controlled by your nausea medication, call the clinic.   BELOW ARE SYMPTOMS THAT SHOULD BE REPORTED IMMEDIATELY:  *FEVER GREATER THAN 100.5 F  *CHILLS WITH OR WITHOUT FEVER  NAUSEA AND VOMITING THAT IS NOT CONTROLLED WITH YOUR NAUSEA MEDICATION  *UNUSUAL SHORTNESS OF BREATH  *UNUSUAL BRUISING OR BLEEDING  TENDERNESS IN MOUTH AND THROAT WITH OR WITHOUT PRESENCE OF ULCERS  *URINARY PROBLEMS  *BOWEL PROBLEMS  UNUSUAL RASH Items with * indicate a potential emergency and should be followed up as soon as possible.  Feel free to call the clinic should you have any questions or concerns. The clinic phone number is [phone].  Please show the CHEMO ALERT CARD at check-in to the Emergency Department and triage nurse.   

## 2020-02-09 DIAGNOSIS — Z95828 Presence of other vascular implants and grafts: Secondary | ICD-10-CM | POA: Diagnosis not present

## 2020-02-09 DIAGNOSIS — Z1331 Encounter for screening for depression: Secondary | ICD-10-CM | POA: Diagnosis not present

## 2020-02-09 DIAGNOSIS — C569 Malignant neoplasm of unspecified ovary: Secondary | ICD-10-CM | POA: Diagnosis not present

## 2020-02-09 DIAGNOSIS — D6859 Other primary thrombophilia: Secondary | ICD-10-CM | POA: Diagnosis not present

## 2020-02-09 DIAGNOSIS — Z1339 Encounter for screening examination for other mental health and behavioral disorders: Secondary | ICD-10-CM | POA: Diagnosis not present

## 2020-02-09 DIAGNOSIS — G629 Polyneuropathy, unspecified: Secondary | ICD-10-CM | POA: Diagnosis not present

## 2020-02-09 DIAGNOSIS — J309 Allergic rhinitis, unspecified: Secondary | ICD-10-CM | POA: Diagnosis not present

## 2020-02-09 DIAGNOSIS — Z86711 Personal history of pulmonary embolism: Secondary | ICD-10-CM | POA: Diagnosis not present

## 2020-02-09 MED FILL — PANTOPRAZOLE SOD DR 40 MG T: 40 | 90 days supply | Qty: 90 | Fill #1

## 2020-02-10 ENCOUNTER — Ambulatory Visit
Admission: RE | Admit: 2020-02-10 | Discharge: 2020-02-10 | Disposition: A | Discharge status: Home / Self Care | Payer: 59 | Source: Ambulatory Visit | Attending: Gynecologic Oncology | Admitting: Gynecologic Oncology

## 2020-02-10 DIAGNOSIS — C563 Malignant neoplasm of bilateral ovaries: Secondary | ICD-10-CM | POA: Diagnosis not present

## 2020-02-10 DIAGNOSIS — C569 Malignant neoplasm of unspecified ovary: Secondary | ICD-10-CM

## 2020-02-10 DIAGNOSIS — I82442 Acute embolism and thrombosis of left tibial vein: Secondary | ICD-10-CM | POA: Diagnosis not present

## 2020-02-10 HISTORY — PX: IR RADIOLOGIST EVAL & MGMT: IMG5224

## 2020-02-10 MED FILL — ELIQUIS 5 MG TABLET: 5 | 30 days supply | Qty: 60 | Fill #3

## 2020-02-10 NOTE — Consult Note (Signed)
Chief Complaint: Patient was seen in consultation today for IVC filter management  Referring Physician(s): Cross,Melissa D  History of Present Illness: Kristina Flores is a 60 y.o. female with history of ovarian cancer, lower extremity DVT and pulmonary embolism. Patient presented to the emergency department in [name] in September 2021 with multiple symptoms. Patient had imaging work-up that demonstrated lower extremity DVT, pulmonary embolism and ovarian malignancy. Patient was diagnosed with stage IIIC high-grade serous carcinoma and has undergone chemotherapy and surgical resection with laparoscopic hysterectomy, bilateral sphincterectomy, omentectomy and radical tumor debulking. Patient has done very well with her treatments and she has minimal complaints at this time. She has 2 more chemotherapy treatments and will be finished in early March. While the patient was an inpatient in [name], she was experiencing bleeding issues while on the anticoagulation. Therefore, an IVC filter was placed on 09/30/2019. Patient does not have any information about the filter placed but I have reviewed the outside imaging and the filter looks like a [employer]. Patient's lower extremity edema has resolved. She is not complaining of leg pain. Abdominal bloating has gone away. She has good appetite. Patient has been vaccinated for COVID-19. She has no chest or abdominal symptoms at this time.   Past Medical History:  Diagnosis Date  . Allergy   . Anemia   . Cancer (Kristina Flores)    ovarian  . DVT (deep venous thrombosis) ([place])   . Heart murmur    benign never an issue  . Numbness and tingling   . Osteopenia 08/2016   T score -1.6 FRAX 7.8%/0.7%  . Peripheral neuropathy, idiopathic   . Pulmonary embolism [facility])     Past Surgical History:  Procedure Laterality Date  . fibroid in 2011,2003    . miscarriage 1989    . ovarian cyst 1992    . Robotic-assisted laparoscopic total hysterectomy with  bilateral salpingoophorectomy, omentectomy, radical tumor debulking   01/10/2020    Allergies: Patient has no known allergies.  Medications: Prior to Admission medications   Medication Sig Start Date End Date Taking? Authorizing Provider  cetirizine (ZYRTEC) 10 MG chewable tablet Chew 10 mg by mouth daily.    [provider]  Cholecalciferol 1000 UNITS capsule Take 2,000 Units by mouth daily.    [provider]  dexamethasone (DECADRON) 4 MG tablet Take 2 tabs at the night before and 2 tab the morning of chemotherapy, every 3 weeks, for total of 2 more cycles, please dispense 8 pills 02/07/20   Heath Lark, MD  ELIQUIS 5 MG TABS tablet Take 1 tablet (5 mg total) by mouth 2 (two) times daily. 10/27/19   Heath Lark, MD  gabapentin (NEURONTIN) 300 MG capsule Take 300 mg by mouth 3 (three) times daily.    [provider]  Melatonin 10 MG TABS Take 10 mg by mouth at bedtime.    [provider]  ondansetron (ZOFRAN) 8 MG tablet Take by mouth every 8 (eight) hours as needed for nausea or vomiting. Patient not taking: Reported on 01/24/2020    [provider]  pantoprazole (PROTONIX) 40 MG tablet Take 1 tablet (40 mg total) by mouth daily. 11/22/19   Heath Lark, MD  prochlorperazine (COMPAZINE) 10 MG tablet Take 1 tablet (10 mg total) by mouth every 6 (six) hours as needed (Nausea or vomiting). Patient not taking: Reported on 01/24/2020 10/20/19   Heath Lark, MD  vitamin B-12 (CYANOCOBALAMIN) 1000 MCG tablet Take 1,000 mcg by mouth daily.  [provider]     Family History  Problem Relation Age of Onset  . Leukemia Mother 69  . Arthritis Father   . Colon cancer Father 48  . Prostate cancer Father 35  . Breast cancer Maternal Grandmother 66  . Leukemia Maternal Grandfather 9  . Colon cancer Paternal Uncle 77  . Colon cancer Cousin 47       paternal female cousin  . Throat cancer Cousin 39       paternal female cousin  . Prostate cancer  Cousin        paternal cousin; dx early 30s; metastatic  . Esophageal cancer Neg Hx   . Rectal cancer Neg Hx   . Stomach cancer Neg Hx     Social History   Socioeconomic History  . Marital status: Married    Spouse name: Kristina Flores  . Number of children: 3  . Years of education: College  . Highest education level: Not on file  Occupational History  . Occupation: PRESCHOOL Product manager: [employer]  Tobacco Use  . Smoking status: Never Smoker  . Smokeless tobacco: Never Used  Vaping Use  . Vaping Use: Never used  Substance and Sexual Activity  . Alcohol use: Not Currently    Comment: occasional  . Drug use: No  . Sexual activity: Yes    Partners: Male    Birth control/protection: Post-menopausal  Other Topics Concern  . Not on file  Social History Narrative   Patient is married Kristina Flores) and lives at home with her husband. Married x 36 years.   Patient has three adult children; 2 in [place] (30, 48); one in [place] (25).  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   Patient has a college education.   Patient is right-handed.   Patient drinks one cup of coffee daily.   Pillates three times per week for sixteen years.     Social Determinants of Health   Financial Resource Strain: Not on file  Food Insecurity: Not on file  Transportation Needs: Not on file  Physical Activity: Not on file  Stress: Not on file  Social Connections: Not on file     Review of Systems  Constitutional: Negative.   Respiratory: Negative.   Cardiovascular: Negative.   Gastrointestinal: Negative.   Genitourinary: Negative.     Vital Signs: There were no vitals taken for this visit.  Physical Exam Constitutional:      Appearance: Normal appearance. She is not ill-appearing.  Cardiovascular:     Rate and Rhythm: Normal rate and regular rhythm.  Pulmonary:     Effort: Pulmonary effort is normal.     Breath  sounds: Normal breath sounds.  Abdominal:     General: Abdomen is flat.     Palpations: Abdomen is soft.  Musculoskeletal:        General: No swelling.     Right lower leg: No edema.     Left lower leg: No edema.  Neurological:     Mental Status: She is alert.        Imaging: US Venous Img Lower Bilateral (DVT)  Result Date: 02/10/2020 CLINICAL DATA:  Ovarian neoplasm, evaluate for DVT EXAM: BILATERAL LOWER EXTREMITY VENOUS DOPPLER ULTRASOUND TECHNIQUE: Gray-scale sonography with graded compression, as well as color Doppler and duplex ultrasound were performed to evaluate the lower extremity deep venous systems from the level of the common femoral vein and including  the common femoral, femoral, profunda femoral, popliteal and calf veins including the posterior tibial, peroneal and gastrocnemius veins when visible. The superficial great saphenous vein was also interrogated. Spectral Doppler was utilized to evaluate flow at rest and with distal augmentation maneuvers in the common femoral, femoral and popliteal veins. COMPARISON:  None. FINDINGS: RIGHT LOWER EXTREMITY Common Femoral Vein: No evidence of thrombus. Normal compressibility, respiratory phasicity and response to augmentation. Saphenofemoral Junction: No evidence of thrombus. Normal compressibility and flow on color Doppler imaging. Profunda Femoral Vein: No evidence of thrombus. Normal compressibility and flow on color Doppler imaging. Femoral Vein: No evidence of thrombus. Normal compressibility, respiratory phasicity and response to augmentation. Popliteal Vein: No evidence of thrombus. Normal compressibility, respiratory phasicity and response to augmentation. Calf Veins: The peroneal veins are patent and compressible. One of the paired posterior tibial veins is not compressible in the lumen contains intermediate echogenicity material and absence of color flow on color Doppler imaging. Findings are consistent with isolated calf vein  DVT. Superficial Great Saphenous Vein: No evidence of thrombus. Normal compressibility. Venous Reflux:  None. Other Findings:  None. LEFT LOWER EXTREMITY Common Femoral Vein: No evidence of thrombus. Normal compressibility, respiratory phasicity and response to augmentation. Saphenofemoral Junction: No evidence of thrombus. Normal compressibility and flow on color Doppler imaging. Profunda Femoral Vein: No evidence of thrombus. Normal compressibility and flow on color Doppler imaging. Femoral Vein: No evidence of thrombus. Normal compressibility, respiratory phasicity and response to augmentation. Popliteal Vein: No evidence of thrombus. Normal compressibility, respiratory phasicity and response to augmentation. Calf Veins: No evidence of thrombus. Normal compressibility and flow on color Doppler imaging. Superficial Great Saphenous Vein: No evidence of thrombus. Normal compressibility. Venous Reflux:  None. Other Findings:  None. IMPRESSION: 1. Positive for isolated calf DVT in 1 of the paired posterior tibial veins on the right. 2. No evidence of DVT in the left lower extremity. Electronically Signed   By: Jacqulynn Cadet M.D.   On: 02/10/2020 11:30    Labs:  CBC: Recent Labs    12/26/19 1244 12/29/19 1029 01/10/20 0545 02/07/20 1001  WBC 1.9* 2.0* 3.6* 3.4*  HGB 7.9* 9.6* 9.5* 10.4*  HCT 23.4* 28.9* 28.8* 30.6*  PLT 164 128* 141* 266    COAGS: No results for input(s): INR, APTT in the last 8760 hours.  BMP: Recent Labs    02/28/19 1529 05/28/19 1156 10/20/19 1300 12/12/19 1342 12/26/19 1244 12/29/19 1029 02/07/20 1001  NA 142 137   < > 139 140 139 141  K 3.9 3.8   < > 3.6 3.9 4.5 3.7  CL 103 102   < > 103 105 103 105  CO2 28 29   < > 28 29 26 29   GLUCOSE 77 138*   < > 106* 112* 96 82  BUN 14 12   < > 13 19 25* 17  CALCIUM 9.4 8.7   < > 9.2 9.3 9.1 9.3  CREATININE 0.74 0.79   < > 0.80 0.94 0.81 0.93  GFRNONAA 88 81   < > >60 >60 >60 >60  GFRAA 101 94  --   --   --   --    --    < > = values in this interval not displayed.    LIVER FUNCTION TESTS: Recent Labs    11/30/19 0833 12/12/19 1342 12/26/19 1244 02/07/20 1001  BILITOT 0.2* 0.3 0.3 0.3  AST 25 19 21 16   ALT 29 16 19 12   ALKPHOS 81 71 69  80  PROT 7.3 7.2 7.3 7.5  ALBUMIN 3.5 3.5 3.7 3.9    TUMOR MARKERS: No results for input(s): AFPTM, CEA, CA199, CHROMGRNA in the last 8760 hours.  Assessment and Plan:  60 year old with history of ovarian cancer, bilateral lower extremity DVT,  pulmonary embolism and status post IVC filter placement. Patient is doing very well with her ovarian cancer treatment and reportedly has 2 more treatments. Patient was referred to Interventional Iadiology to evaluate the IVC filter and consider retrieval. Lower extremity duplex was performed today and it demonstrates only a small amount of thrombus in a right posterior tibial vein. In addition, the patient is anticoagulated on Eliquis. I have reviewed the patient's outside imaging from [name] as well as a follow-up CT of the abdomen and pelvis. Patient appears to have a [employer]. No evidence of thrombus in the IVC or around the filter on the CT from 12/12/2019. I had a long discussion with the patient and her husband about the indications for IVC filter placement and retrieval. At this time, the patient is fully anticoagulated and not having any apparent bleeding issues. In addition, majority of her lower extremity clot has likely resolved. We recommend retrieval of the IVC filter when it is no longer needed because there is a risk of filter migration, filter fracture and caval thrombosis associated with filters. We discussed the filter retrieval procedure which is performed as an outpatient with moderate sedation. We also reviewed the imaging and I showed the patient that the IVC filter hook is along the anterior caval wall which can sometimes be a technical issue with removing this type of filter. I explained that  sometimes filters cannot be removed or may take more than one attempt to remove them safely.  I recommend retrieval of her IVC filter because she no longer needs it now that she is anticoagulated. We discussed the timing of the filter retrieval procedure. Based on the COVID-19 pandemic and ongoing chemotherapy, I suggested that we wait for the patient to complete her chemotherapy in March. I do not believe there will be any significant difference between attempting the retrieval now versus waiting 2 months. Patient and husband are comfortable with this plan. Therefore, we will anticipate scheduling the patient for IVC filter retrieval at the end of March. I instructed the patient that she should continue to take her anticoagulation and does NOT need to stop the anticoagulation for the procedure.  Thank you for this interesting consult.  I greatly enjoyed meeting Thana Farr Schadt and look forward to participating in their care.  A copy of this report was sent to the requesting provider on this date.  Electronically Signed: Burman Riis 02/10/2020, 11:40 AM   I spent a total of  30 Minutes   in face to face in clinical consultation, greater than 50% of which was counseling/coordinating care for IVC filter management.

## 2020-02-16 ENCOUNTER — Other Ambulatory Visit (HOSPITAL_COMMUNITY): Payer: Self-pay | Admitting: Diagnostic Radiology

## 2020-02-16 DIAGNOSIS — Z95828 Presence of other vascular implants and grafts: Secondary | ICD-10-CM

## 2020-02-29 ENCOUNTER — Inpatient Hospital Stay: Payer: 59

## 2020-02-29 ENCOUNTER — Inpatient Hospital Stay: Payer: 59 | Attending: Hematology and Oncology

## 2020-02-29 ENCOUNTER — Encounter: Payer: Self-pay | Admitting: Hematology and Oncology

## 2020-02-29 ENCOUNTER — Other Ambulatory Visit: Payer: Self-pay

## 2020-02-29 ENCOUNTER — Inpatient Hospital Stay (HOSPITAL_BASED_OUTPATIENT_CLINIC_OR_DEPARTMENT_OTHER): Payer: 59 | Admitting: Hematology and Oncology

## 2020-02-29 DIAGNOSIS — R180 Malignant ascites: Secondary | ICD-10-CM

## 2020-02-29 DIAGNOSIS — C786 Secondary malignant neoplasm of retroperitoneum and peritoneum: Secondary | ICD-10-CM | POA: Diagnosis not present

## 2020-02-29 DIAGNOSIS — R918 Other nonspecific abnormal finding of lung field: Secondary | ICD-10-CM

## 2020-02-29 DIAGNOSIS — I2699 Other pulmonary embolism without acute cor pulmonale: Secondary | ICD-10-CM | POA: Diagnosis not present

## 2020-02-29 DIAGNOSIS — C561 Malignant neoplasm of right ovary: Secondary | ICD-10-CM | POA: Insufficient documentation

## 2020-02-29 DIAGNOSIS — I824Z3 Acute embolism and thrombosis of unspecified deep veins of distal lower extremity, bilateral: Secondary | ICD-10-CM

## 2020-02-29 DIAGNOSIS — Z95828 Presence of other vascular implants and grafts: Secondary | ICD-10-CM

## 2020-02-29 DIAGNOSIS — D61818 Other pancytopenia: Secondary | ICD-10-CM

## 2020-02-29 DIAGNOSIS — C569 Malignant neoplasm of unspecified ovary: Secondary | ICD-10-CM

## 2020-02-29 DIAGNOSIS — Z79899 Other long term (current) drug therapy: Secondary | ICD-10-CM | POA: Diagnosis not present

## 2020-02-29 DIAGNOSIS — G609 Hereditary and idiopathic neuropathy, unspecified: Secondary | ICD-10-CM

## 2020-02-29 DIAGNOSIS — Z5111 Encounter for antineoplastic chemotherapy: Secondary | ICD-10-CM | POA: Insufficient documentation

## 2020-02-29 LAB — CBC WITH DIFFERENTIAL/PLATELET
Abs Immature Granulocytes: 0.07 10*3/uL (ref 0.00–0.07)
Basophils Absolute: 0 10*3/uL (ref 0.0–0.1)
Basophils Relative: 0 %
Eosinophils Absolute: 0 10*3/uL (ref 0.0–0.5)
Eosinophils Relative: 0 %
HCT: 30.5 % — ABNORMAL LOW (ref 36.0–46.0)
Hemoglobin: 10.6 g/dL — ABNORMAL LOW (ref 12.0–15.0)
Immature Granulocytes: 2 %
Lymphocytes Relative: 12 %
Lymphs Abs: 0.5 10*3/uL — ABNORMAL LOW (ref 0.7–4.0)
MCH: 33.3 pg (ref 26.0–34.0)
MCHC: 34.8 g/dL (ref 30.0–36.0)
MCV: 95.9 fL (ref 80.0–100.0)
Monocytes Absolute: 0.1 10*3/uL (ref 0.1–1.0)
Monocytes Relative: 2 %
Neutro Abs: 3.7 10*3/uL (ref 1.7–7.7)
Neutrophils Relative %: 84 %
Platelets: 134 10*3/uL — ABNORMAL LOW (ref 150–400)
RBC: 3.18 MIL/uL — ABNORMAL LOW (ref 3.87–5.11)
RDW: 13 % (ref 11.5–15.5)
WBC: 4.4 10*3/uL (ref 4.0–10.5)
nRBC: 0 % (ref 0.0–0.2)

## 2020-02-29 LAB — COMPREHENSIVE METABOLIC PANEL
ALT: 17 U/L (ref 0–44)
AST: 20 U/L (ref 15–41)
Albumin: 4.3 g/dL (ref 3.5–5.0)
Alkaline Phosphatase: 94 U/L (ref 38–126)
Anion gap: 10 (ref 5–15)
BUN: 22 mg/dL (ref 8–23)
CO2: 23 mmol/L (ref 22–32)
Calcium: 9.4 mg/dL (ref 8.9–10.3)
Chloride: 106 mmol/L (ref 98–111)
Creatinine, Ser: 0.98 mg/dL (ref 0.44–1.00)
GFR, Estimated: >60 mL/min (ref >60)
Glucose, Bld: 125 mg/dL — ABNORMAL HIGH (ref 70–99)
Potassium: 3.7 mmol/L (ref 3.5–5.1)
Sodium: 139 mmol/L (ref 135–145)
Total Bilirubin: 0.3 mg/dL (ref 0.3–1.2)
Total Protein: 8.1 g/dL (ref 6.5–8.1)

## 2020-02-29 LAB — SAMPLE TO BLOOD BANK

## 2020-02-29 MED ORDER — FOSAPREPITANT DIMEGLUMINE INJECTION 150 MG
150.0000 mg | Freq: Once | INTRAVENOUS | Status: AC
  Administered 2020-02-29: 150 mg via INTRAVENOUS
  Filled 2020-02-29: qty 150

## 2020-02-29 MED ORDER — SODIUM CHLORIDE 0.9 % IV SOLN
10.0000 mg | Freq: Once | INTRAVENOUS | Status: AC
  Administered 2020-02-29: 10 mg via INTRAVENOUS
  Filled 2020-02-29: qty 10

## 2020-02-29 MED ORDER — SODIUM CHLORIDE 0.9 % IV SOLN
550.8000 mg | Freq: Once | INTRAVENOUS | Status: AC
  Administered 2020-02-29: 550 mg via INTRAVENOUS
  Filled 2020-02-29: qty 55

## 2020-02-29 MED ORDER — FAMOTIDINE IN NACL 20-0.9 MG/50ML-% IV SOLN
20.0000 mg | Freq: Once | INTRAVENOUS | Status: AC
  Administered 2020-02-29: 20 mg via INTRAVENOUS

## 2020-02-29 MED ORDER — SODIUM CHLORIDE 0.9 % IV SOLN
Freq: Once | INTRAVENOUS | Status: AC
  Filled 2020-02-29: qty 250

## 2020-02-29 MED ORDER — PALONOSETRON HCL INJECTION 0.25 MG/5ML
0.2500 mg | Freq: Once | INTRAVENOUS | Status: AC
  Administered 2020-02-29: 0.25 mg via INTRAVENOUS

## 2020-02-29 MED ORDER — SODIUM CHLORIDE 0.9 % IV SOLN
140.0000 mg/m2 | Freq: Once | INTRAVENOUS | Status: AC
  Administered 2020-02-29: 252 mg via INTRAVENOUS
  Filled 2020-02-29: qty 42

## 2020-02-29 MED ORDER — DIPHENHYDRAMINE HCL 50 MG/ML IJ SOLN
12.5000 mg | Freq: Once | INTRAMUSCULAR | Status: AC
  Administered 2020-02-29: 12.5 mg via INTRAVENOUS

## 2020-02-29 MED ORDER — FAMOTIDINE IN NACL 20-0.9 MG/50ML-% IV SOLN
INTRAVENOUS | Status: AC
  Filled 2020-02-29: qty 50

## 2020-02-29 MED ORDER — PALONOSETRON HCL INJECTION 0.25 MG/5ML
INTRAVENOUS | Status: AC
  Filled 2020-02-29: qty 5

## 2020-02-29 MED ORDER — DIPHENHYDRAMINE HCL 50 MG/ML IJ SOLN
INTRAMUSCULAR | Status: AC
  Filled 2020-02-29: qty 1

## 2020-02-29 NOTE — Assessment & Plan Note (Signed)
This is stable She will continue current dose of chemotherapy as prescribed, along with gabapentin

## 2020-02-29 NOTE — Assessment & Plan Note (Signed)
She will continue anticoagulation therapy for at least a year from the date of diagnosis, until the end of the year 

## 2020-02-29 NOTE — Patient Instructions (Signed)
   [facility] Discharge Instructions for Patients Receiving Chemotherapy  Today you received the following chemotherapy agents Taxol and Carboplatin   To help prevent nausea and vomiting after your treatment, we encourage you to take your nausea medication as directed.    If you develop nausea and vomiting that is not controlled by your nausea medication, call the clinic.   BELOW ARE SYMPTOMS THAT SHOULD BE REPORTED IMMEDIATELY:  *FEVER GREATER THAN 100.5 F  *CHILLS WITH OR WITHOUT FEVER  NAUSEA AND VOMITING THAT IS NOT CONTROLLED WITH YOUR NAUSEA MEDICATION  *UNUSUAL SHORTNESS OF BREATH  *UNUSUAL BRUISING OR BLEEDING  TENDERNESS IN MOUTH AND THROAT WITH OR WITHOUT PRESENCE OF ULCERS  *URINARY PROBLEMS  *BOWEL PROBLEMS  UNUSUAL RASH Items with * indicate a potential emergency and should be followed up as soon as possible.  Feel free to call the clinic should you have any questions or concerns. The clinic phone number is [phone].  Please show the CHEMO ALERT CARD at check-in to the Emergency Department and triage nurse.   

## 2020-02-29 NOTE — Assessment & Plan Note (Signed)
This is due to her recent treatment She will continue oral vitamin B12 supplement We will proceed with treatment without delay

## 2020-02-29 NOTE — Progress Notes (Signed)
[facility] OFFICE PROGRESS NOTE  Patient Care Team: Marton Redwood, MD as PCP - General (Internal Medicine) Awanda Mink Craige Cotta, RN as Oncology Nurse Navigator (Oncology)  ASSESSMENT & PLAN:  Ovarian cancer [facility]) She tolerated last cycle of treatment well without major side effects except for very mild neuropathy We will proceed with chemotherapy as scheduled   Pancytopenia, acquired [facility]) This is due to her recent treatment She will continue oral vitamin B12 supplement We will proceed with treatment without delay  Idiopathic neuropathy This is stable She will continue current dose of chemotherapy as prescribed, along with gabapentin  Acute pulmonary embolism ([place]) She will continue anticoagulation therapy for at least a year from the date of diagnosis, until the end of the year  S/P IVC filter She has seen interventional radiologist about IVC filter retrieval There is no contraindication for her to proceed   No orders of the defined types were placed in this encounter.   All questions were answered. The patient knows to call the clinic with any problems, questions or concerns. The total time spent in the appointment was 30 minutes encounter with patients including review of chart and various tests results, discussions about plan of care and coordination of care plan   Heath Lark, MD 02/29/2020 1:45 PM  INTERVAL HISTORY: Please see below for problem oriented charting. She returns with her husband for further follow-up She is doing well Her neuropathy is stable/improved She saw interventional radiologist regarding IVC filter retrieval and she is thinking about having the procedure done after chemotherapy is completed No recent nausea constipation She has numerous questions about future plan of care  SUMMARY OF ONCOLOGIC HISTORY: Oncology History  Ovarian cancer ([place])  09/20/2019 Initial Diagnosis   Presented to her primary care doctor with abdominal bloating.   Abdominal x-ray imaging was unremarkable.   09/29/2019 Imaging   CT of the chest showed pulmonary embolism in all lobes.  Large right/central pelvic mass likely ovarian neoplasm with omental/peritoneal metastasis with significant free fluid in the abdomen and pelvis.  A small left uterine calcification could be seen in a degenerating fibroid.  Small right and left lung base nodules, could represent sequela of previous infection or inflammation with metastasis not excluded.   09/29/2019 - 10/15/2019 Hospital Admission   She presented to the emergency department in [name] after complaints of leg pain with bilateral lower extremity edema.  D-dimer was elevated.  Lower extremity ultrasound demonstrated extensive bilateral DVT involving peroneal and soleal calf veins on the left and peroneal vein on the right.  CT abdomen and pelvis showed large pelvic mass with omental metastasis and ascites, worrisome for ovarian cancer.  CT imaging of the chest showed bilateral pulmonary nodules as well as evidence of pulmonary embolism.  She received anticoagulation therapy.  She was also found to be anemic, worrisome that she might have bleeding in the tumor and received transfusion.  Echocardiogram showed preserved ejection fraction but evidence of moderate right ventricular strain.  Subsequent biopsy showed cancer suspicious for ovarian primary and she received first cycle of neoadjuvant chemotherapy with carboplatin and taxol. She had IVC filter placement and temporary TPN   09/30/2019 Procedure   Ultrasound paracentesis was performed complicated by bleeding and received blood transfusion   09/30/2019 Procedure   She had IVC filter placement by interventional radiologist in [facility].   09/30/2019 Echocardiogram   Outside echocardiogram showed left ventricular ejection fraction around 79%.  Concentric left ventricular modeling.  Grade 1 diastolic pattern with normal  left atrial pressure.  Right ventricle size is mildly  enlarged.  Right ventricular systolic function is moderately reduced.  Right ventricle was not well visualized.  Aortic sclerosis without evidence of stenosis.  Moderate pleural effusion on the left region   10/02/2019 Procedure   EGD was performed which showed no evidence of acute bleeding.  Biopsy of the stomach showed benign gastric mucosa with reactive gastropathy as may be seen in the healing phase of erosive gastritis.  Helicobacter organism testing is negative..   10/03/2019 Procedure   She underwent CT-guided biopsy of the pelvic mass.   10/03/2019 Tumor Marker   CEA level was 2.9.   10/03/2019 Pathology Results   Biopsy show adenocarcinoma.  The malignant cells stain positive for keratin AE1/AE3, CK7 and MOC-31.  Rare cells show weak reactivity with p63 and GA TA 3.  Cells are negative for CK20, calretinin, CDX2, TTF-1, ER and WT-1.  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  Possible consideration include, but not limited to, gynecological origin, pancreatico biliary and urothelial.   10/03/2019 Tumor Marker   Patient's tumor was tested for the following markers: CA-125 Results of the tumor marker test revealed 713 CA19-9 is 100   10/04/2019 Imaging   Tagged red blood cell scan equivocal for source of bleeding.   10/05/2019 Procedure   She underwent paracentesis.  IR removed 4100 cc of ascites fluid with significant blood noted.   10/05/2019 Imaging   CT abdomen and pelvis angiogram protocol show no obvious source of bleeding.   10/05/2019 Imaging   CT imaging of the abdomen and pelvis showed no evidence of postprocedural hemorrhage or discrete area of GI bleeding.  Large volume ascites is present.  Small pockets of intraperitoneal free air are present, likely related to recent paracentesis.  Abnormal mass in the pelvic region most likely a neoplasm of uterine or ovarian origin.  Normal liver with benign  cysts of the right hepatic dome.  Biliary tree and pancreas and spleen were age-appropriate   10/07/2019 Procedure   Ultrasound paracentesis was performed.   10/07/2019 -  Chemotherapy   The patient had neoadjuvant carboplatin and taxol for chemotherapy treatment.     10/08/2019 Imaging   CT angiogram of the abdominal aorta and iliofemoral test was done showing 14 x 10 x 14 cm pelvic mass with mild to moderate ascites.  Normal appearance of arterial structures without evidence of compression.  No evidence of compression of the iliac vein or IVC.   10/11/2019 Imaging   Ultrasound shows gallbladder full of sludge.  Fatty liver changes.  Ascites present.   10/18/2019 Cancer Staging   Staging form: Ovary, Fallopian Tube, and Primary Peritoneal Carcinoma, AJCC 8th Edition - Clinical stage from 10/18/2019: FIGO Stage IIIC, calculated as Stage Unknown (cT3c, cNX, cM0) - Signed by Heath Lark, MD on 10/18/2019   10/21/2019 Tumor Marker   Patient's tumor was tested for the following markers: CA-125. Results of the tumor marker test revealed 257.   10/31/2019 -  Chemotherapy   The patient had carboplatin and taxol for chemotherapy treatment.     11/22/2019 Tumor Marker   Patient's tumor was tested for the following markers: CA-125. Results of the tumor marker test revealed 30.7   12/12/2019 Imaging   1. Today's study demonstrates a positive response to therapy with regression of right ovarian lesion(s), and decreased volume of presumably malignant ascites. 2. No definitive evidence of metastatic disease in the thorax. Multiple  tiny 2-4 mm pulmonary nodules are stable compared to prior examinations. Continued attention on follow-up studies is recommended to exclude the possibility of metastatic lesions    12/26/2019 Tumor Marker   Patient's tumor was tested for the following markers: CA-125 Results of the tumor marker test revealed 16.4   01/10/2020 Surgery   Surgeon: Donaciano Eva     Assistants: Dr Lahoma Crocker (an MD assistant was necessary for tissue manipulation, management of robotic instrumentation, retraction and positioning due to the complexity of the case and hospital policies).  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    Post-operative Diagnosis: same   Operation: Robotic-assisted laparoscopic total hysterectomy with bilateral salpingoophorectomy, omentectomy, radical tumor debulking     Operative Findings:  : bulky right ovarian mass (approximately 6cm), mild nodularity to left ovary, tumor nodules on peritoneum of pelvis (all resected), no gross omental disease. No gross residual tumor at the completion of the procedure representing a complete (optimal) cytoreduction   01/10/2020 Pathology Results   Clinical History: Ovarian cancer ([facility])   FINAL MICROSCOPIC DIAGNOSIS:   A. UTERUS, CERVIX, BILATERAL TUBES AND OVARIES:  - Right ovary:       Microscopic foci of adenocarcinoma associated with extensive necrosis and hemosiderin deposition.       No ovarian surface involvement identified.       Paraovarian necrotic and hemorrhagic nodule.       See oncology table and comment.  - Left Fallopian tube:       Microscopic focus of adenocarcinoma involving submucosa.       See comment.  - Cervix:       Nabothian cyst.       No dysplasia or malignancy.  - Endometrium:       Inactive.       No hyperplasia or malignancy.  - Right Fallopian tube:       Adhesions with focal hemosiderin deposition.       No malignancy identified.  - Left ovary:       Endosalpingosis.       No malignancy identified.   B. PERITONEAL NODULE, BIOPSY:  - Necrotic and hemorrhagic nodule with hemosiderin deposition.  - No viable malignancy identified.   C. OMENTUM:  - Foci of fibrosis associated with adhesions and hemosiderin deposition.  - No malignancy identified.  -  One benign lymph node (0/1).   ONCOLOGY TABLE:  OVARY or FALLOPIAN TUBE or PRIMARY PERITONEUM: Resection  Procedure: Hysterectomy with bilateral salpingo-oophorectomy, peritoneal nodule biopsy and omentectomy.  Specimen Integrity: Intact.  Tumor Site: Right ovary.  Tumor Size: 5.2 x 4.5 x 3.8 cm, see comment.  Histologic Type: Endometrioid adenocarcinoma.  Histologic Grade: FIGO grade 2.  Ovarian Surface Involvement: Not identified.  Fallopian Tube Surface Involvement: Not identified.  See comment.  Other Tissue/ Organ Involvement: Not identified.  Peritoneal/Ascitic Fluid Involvement: Not applicable.  Chemotherapy Response Score (CRS): Near complete response, CRS 3.  See  comment.  Regional Lymph Nodes: No lymph nodes submitted.  Distant Metastasis:       Distant Site(s) Involved: Not identified.  Pathologic Stage Classification (pTNM, AJCC 8th Edition): ypT2a, ypNX  Ancillary Studies: Can be performed if requested.  Representative Tumor Block: A6 and A7.      REVIEW OF SYSTEMS:   Constitutional: Denies fevers, chills or abnormal weight loss Eyes: Denies blurriness of vision Ears, nose, mouth, throat, and face: Denies mucositis or sore throat Respiratory: Denies  cough, dyspnea or wheezes Cardiovascular: Denies palpitation, chest discomfort or lower extremity swelling Gastrointestinal:  Denies nausea, heartburn or change in bowel habits Skin: Denies abnormal skin rashes Lymphatics: Denies new lymphadenopathy or easy bruising Behavioral/Psych: Mood is stable, no new changes  All other systems were reviewed with the patient and are negative.  I have reviewed the past medical history, past surgical history, social history and family history with the patient and they are unchanged from previous note.  ALLERGIES:  has No Known Allergies.  MEDICATIONS:  Current Outpatient Medications  Medication Sig Dispense Refill  . cetirizine (ZYRTEC) 10 MG chewable tablet Chew 10 mg by mouth  daily.    . Cholecalciferol 1000 UNITS capsule Take 2,000 Units by mouth daily.    Marland Kitchen dexamethasone (DECADRON) 4 MG tablet Take 2 tabs at the night before and 2 tab the morning of chemotherapy, every 3 weeks, for total of 2 more cycles, please dispense 8 pills 8 tablet 0  . ELIQUIS 5 MG TABS tablet Take 1 tablet (5 mg total) by mouth 2 (two) times daily. 60 tablet 11  . gabapentin (NEURONTIN) 300 MG capsule Take 300 mg by mouth 3 (three) times daily.    . Melatonin 10 MG TABS Take 10 mg by mouth at bedtime.    . ondansetron (ZOFRAN) 8 MG tablet Take by mouth every 8 (eight) hours as needed for nausea or vomiting. (Patient not taking: Reported on 01/24/2020)    . pantoprazole (PROTONIX) 40 MG tablet Take 1 tablet (40 mg total) by mouth daily. 90 tablet 1  . prochlorperazine (COMPAZINE) 10 MG tablet Take 1 tablet (10 mg total) by mouth every 6 (six) hours as needed (Nausea or vomiting). (Patient not taking: Reported on 01/24/2020) 30 tablet 1  . vitamin B-12 (CYANOCOBALAMIN) 1000 MCG tablet Take 1,000 mcg by mouth daily.     No current facility-administered medications for this visit.   Facility-Administered Medications Ordered in Other Visits  Medication Dose Route Frequency Provider Last Rate Last Admin  . CARBOplatin (PARAPLATIN) 550 mg in sodium chloride 0.9 % 250 mL chemo infusion  550 mg Intravenous Once Alvy Bimler, Romani Wilbon, MD      . PACLitaxel (TAXOL) 252 mg in sodium chloride 0.9 % 250 mL chemo infusion (> 80mg /m2)  140 mg/m2 (Treatment Plan Recorded) Intravenous Once Heath Lark, MD 97 mL/hr at 02/29/20 1315 252 mg at 02/29/20 1315    PHYSICAL EXAMINATION: ECOG PERFORMANCE STATUS: 1 - Symptomatic but completely ambulatory  Vitals:   02/29/20 1013  BP: (!) 156/91  Pulse: 77  Resp: 16  Temp: 97.9 F (36.6 C)  SpO2: 98%   Filed Weights   02/29/20 1013  Weight: 159 lb (72.1 kg)    GENERAL:alert, no distress and comfortable SKIN: skin color, texture, turgor are normal, no rashes or  significant lesions EYES: normal, Conjunctiva are pink and non-injected, sclera clear OROPHARYNX:no exudate, no erythema and lips, buccal mucosa, and tongue normal  NECK: supple, thyroid normal size, non-tender, without nodularity LYMPH:  no palpable lymphadenopathy in the cervical, axillary or inguinal LUNGS: clear to auscultation and percussion with normal breathing effort HEART: regular rate & rhythm and no murmurs and no lower extremity edema ABDOMEN:abdomen soft, non-tender and normal bowel sounds Musculoskeletal:no cyanosis of digits and no clubbing  NEURO: alert & oriented x 3 with fluent speech, no focal motor/sensory deficits  LABORATORY DATA:  I have reviewed the data as listed    Component Value Date/Time   NA 139 02/29/2020 0950   NA  142 02/28/2019 1529   K 3.7 02/29/2020 0950   CL 106 02/29/2020 0950   CO2 23 02/29/2020 0950   GLUCOSE 125 (H) 02/29/2020 0950   BUN 22 02/29/2020 0950   BUN 14 02/28/2019 1529   CREATININE 0.98 02/29/2020 0950   CREATININE 0.79 05/28/2019 1156   CALCIUM 9.4 02/29/2020 0950   PROT 8.1 02/29/2020 0950   PROT 6.7 02/28/2019 1529   ALBUMIN 4.3 02/29/2020 0950   ALBUMIN 4.3 02/28/2019 1529   AST 20 02/29/2020 0950   ALT 17 02/29/2020 0950   ALKPHOS 94 02/29/2020 0950   BILITOT 0.3 02/29/2020 0950   BILITOT <0.2 02/28/2019 1529   GFRNONAA >60 02/29/2020 0950   GFRNONAA 81 05/28/2019 1156   GFRAA 94 05/28/2019 1156    No results found for: SPEP, UPEP  Lab Results  Component Value Date   WBC 4.4 02/29/2020   NEUTROABS 3.7 02/29/2020   HGB 10.6 (L) 02/29/2020   HCT 30.5 (L) 02/29/2020   MCV 95.9 02/29/2020   PLT 134 (L) 02/29/2020      Chemistry      Component Value Date/Time   NA 139 02/29/2020 0950   NA 142 02/28/2019 1529   K 3.7 02/29/2020 0950   CL 106 02/29/2020 0950   CO2 23 02/29/2020 0950   BUN 22 02/29/2020 0950   BUN 14 02/28/2019 1529   CREATININE 0.98 02/29/2020 0950   CREATININE 0.79 05/28/2019 1156       Component Value Date/Time   CALCIUM 9.4 02/29/2020 0950   ALKPHOS 94 02/29/2020 0950   AST 20 02/29/2020 0950   ALT 17 02/29/2020 0950   BILITOT 0.3 02/29/2020 0950   BILITOT <0.2 02/28/2019 1529       RADIOGRAPHIC STUDIES: I have personally reviewed the radiological images as listed and agreed with the findings in the report. US Venous Img Lower Bilateral (DVT)  Result Date: 02/10/2020 CLINICAL DATA:  Ovarian neoplasm, evaluate for DVT EXAM: BILATERAL LOWER EXTREMITY VENOUS DOPPLER ULTRASOUND TECHNIQUE: Gray-scale sonography with graded compression, as well as color Doppler and duplex ultrasound were performed to evaluate the lower extremity deep venous systems from the level of the common femoral vein and including the common femoral, femoral, profunda femoral, popliteal and calf veins including the posterior tibial, peroneal and gastrocnemius veins when visible. The superficial great saphenous vein was also interrogated. Spectral Doppler was utilized to evaluate flow at rest and with distal augmentation maneuvers in the common femoral, femoral and popliteal veins. COMPARISON:  None. FINDINGS: RIGHT LOWER EXTREMITY Common Femoral Vein: No evidence of thrombus. Normal compressibility, respiratory phasicity and response to augmentation. Saphenofemoral Junction: No evidence of thrombus. Normal compressibility and flow on color Doppler imaging. Profunda Femoral Vein: No evidence of thrombus. Normal compressibility and flow on color Doppler imaging. Femoral Vein: No evidence of thrombus. Normal compressibility, respiratory phasicity and response to augmentation. Popliteal Vein: No evidence of thrombus. Normal compressibility, respiratory phasicity and response to augmentation. Calf Veins: The peroneal veins are patent and compressible. One of the paired posterior tibial veins is not compressible in the lumen contains intermediate echogenicity material and absence of color flow on color Doppler imaging.  Findings are consistent with isolated calf vein DVT. Superficial Great Saphenous Vein: No evidence of thrombus. Normal compressibility. Venous Reflux:  None. Other Findings:  None. LEFT LOWER EXTREMITY Common Femoral Vein: No evidence of thrombus. Normal compressibility, respiratory phasicity and response to augmentation. Saphenofemoral Junction: No evidence of thrombus. Normal compressibility and flow on color Doppler imaging. Profunda  Femoral Vein: No evidence of thrombus. Normal compressibility and flow on color Doppler imaging. Femoral Vein: No evidence of thrombus. Normal compressibility, respiratory phasicity and response to augmentation. Popliteal Vein: No evidence of thrombus. Normal compressibility, respiratory phasicity and response to augmentation. Calf Veins: No evidence of thrombus. Normal compressibility and flow on color Doppler imaging. Superficial Great Saphenous Vein: No evidence of thrombus. Normal compressibility. Venous Reflux:  None. Other Findings:  None. IMPRESSION: 1. Positive for isolated calf DVT in 1 of the paired posterior tibial veins on the right. 2. No evidence of DVT in the left lower extremity. Electronically Signed   By: Jacqulynn Cadet M.D.   On: 02/10/2020 11:30   IR Radiologist Eval & Mgmt  Result Date: 02/10/2020 Please refer to notes tab for details about interventional procedure. (Op Note)

## 2020-02-29 NOTE — Assessment & Plan Note (Signed)
She has seen interventional radiologist about IVC filter retrieval There is no contraindication for her to proceed 

## 2020-02-29 NOTE — Assessment & Plan Note (Signed)
She tolerated last cycle of treatment well without major side effects except for very mild neuropathy We will proceed with chemotherapy as scheduled

## 2020-03-01 ENCOUNTER — Telehealth: Payer: Self-pay | Admitting: Oncology

## 2020-03-01 LAB — CA 125: Cancer Antigen (CA) 125: 14.2 U/mL (ref 0.0–38.1)

## 2020-03-01 NOTE — Telephone Encounter (Signed)
Kristina Flores and she will try the diagnosis code C56.9.

## 2020-03-01 NOTE — Telephone Encounter (Signed)
Ovarian cancer C56.9

## 2020-03-01 NOTE — Telephone Encounter (Signed)
Sina called and said she is trying to get a wig covered by her insurance.  She has sent in the prescription with the diagnosis code for alopecia which was denied.  She said they need a code for chemotherapy due to cancer to approve it.

## 2020-03-06 ENCOUNTER — Encounter: Payer: Self-pay | Admitting: Gynecologic Oncology

## 2020-03-09 ENCOUNTER — Encounter: Payer: Self-pay | Admitting: Genetic Counselor

## 2020-03-09 ENCOUNTER — Telehealth: Payer: Self-pay | Admitting: Genetic Counselor

## 2020-03-09 ENCOUNTER — Ambulatory Visit: Payer: Self-pay | Admitting: Genetic Counselor

## 2020-03-09 DIAGNOSIS — C569 Malignant neoplasm of unspecified ovary: Secondary | ICD-10-CM

## 2020-03-09 DIAGNOSIS — Z1379 Encounter for other screening for genetic and chromosomal anomalies: Secondary | ICD-10-CM | POA: Insufficient documentation

## 2020-03-09 DIAGNOSIS — Z806 Family history of leukemia: Secondary | ICD-10-CM

## 2020-03-09 DIAGNOSIS — Z803 Family history of malignant neoplasm of breast: Secondary | ICD-10-CM

## 2020-03-09 DIAGNOSIS — Z800 Family history of malignant neoplasm of digestive organs: Secondary | ICD-10-CM

## 2020-03-09 NOTE — Progress Notes (Signed)
HPI:  [name] was previously seen in the [facility] due to a personal history of cancer and concerns regarding a hereditary predisposition to cancer. Please refer to our prior cancer genetics clinic note for more information regarding our discussion, assessment and recommendations, at the time. [name]'s recent genetic test results were disclosed to her, as were recommendations warranted by these results. These results and recommendations are discussed in more detail below.  CANCER HISTORY:  Oncology History  Ovarian cancer ([facility])  09/20/2019 Initial Diagnosis   Presented to her primary care doctor with abdominal bloating.  Abdominal x-ray imaging was unremarkable.   09/29/2019 Imaging   CT of the chest showed pulmonary embolism in all lobes.  Large right/central pelvic mass likely ovarian neoplasm with omental/peritoneal metastasis with significant free fluid in the abdomen and pelvis.  A small left uterine calcification could be seen in a degenerating fibroid.  Small right and left lung base nodules, could represent sequela of previous infection or inflammation with metastasis not excluded.   09/29/2019 - 10/15/2019 Hospital Admission   She presented to the emergency department in [name] after complaints of leg pain with bilateral lower extremity edema.  D-dimer was elevated.  Lower extremity ultrasound demonstrated extensive bilateral DVT involving peroneal and soleal calf veins on the left and peroneal vein on the right.  CT abdomen and pelvis showed large pelvic mass with omental metastasis and ascites, worrisome for ovarian cancer.  CT imaging of the chest showed bilateral pulmonary nodules as well as evidence of pulmonary embolism.  She received anticoagulation therapy.  She was also found to be anemic, worrisome that she might have bleeding in the tumor and received transfusion.  Echocardiogram showed preserved ejection fraction but evidence of moderate right ventricular  strain.  Subsequent biopsy showed cancer suspicious for ovarian primary and she received first cycle of neoadjuvant chemotherapy with carboplatin and taxol. She had IVC filter placement and temporary TPN   09/30/2019 Procedure   Ultrasound paracentesis was performed complicated by bleeding and received blood transfusion   09/30/2019 Procedure   She had IVC filter placement by interventional radiologist in [facility].   09/30/2019 Echocardiogram   Outside echocardiogram showed left ventricular ejection fraction around 79%.  Concentric left ventricular modeling.  Grade 1 diastolic pattern with normal left atrial pressure.  Right ventricle size is mildly enlarged.  Right ventricular systolic function is moderately reduced.  Right ventricle was not well visualized.  Aortic sclerosis without evidence of stenosis.  Moderate pleural effusion on the left region   10/02/2019 Procedure   EGD was performed which showed no evidence of acute bleeding.  Biopsy of the stomach showed benign gastric mucosa with reactive gastropathy as may be seen in the healing phase of erosive gastritis.  Helicobacter organism testing is negative..   10/03/2019 Procedure   She underwent CT-guided biopsy of the pelvic mass.   10/03/2019 Tumor Marker   CEA level was 2.9.   10/03/2019 Pathology Results   Biopsy show adenocarcinoma.  The malignant cells stain positive for keratin AE1/AE3, CK7 and MOC-31.  Rare cells show weak reactivity with p63 and GA TA 3.  Cells are negative for CK20, calretinin, CDX2, TTF-1, ER and WT-1.  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  Possible consideration include, but not limited to, gynecological origin, pancreatico biliary and urothelial.   10/03/2019 Tumor Marker   Patient's tumor was tested for the following markers: CA-125 Results of the tumor  marker test revealed 713 CA19-9 is 100   10/04/2019 Imaging   Tagged red  blood cell scan equivocal for source of bleeding.   10/05/2019 Procedure   She underwent paracentesis.  IR removed 4100 cc of ascites fluid with significant blood noted.   10/05/2019 Imaging   CT abdomen and pelvis angiogram protocol show no obvious source of bleeding.   10/05/2019 Imaging   CT imaging of the abdomen and pelvis showed no evidence of postprocedural hemorrhage or discrete area of GI bleeding.  Large volume ascites is present.  Small pockets of intraperitoneal free air are present, likely related to recent paracentesis.  Abnormal mass in the pelvic region most likely a neoplasm of uterine or ovarian origin.  Normal liver with benign cysts of the right hepatic dome.  Biliary tree and pancreas and spleen were age-appropriate   10/07/2019 Procedure   Ultrasound paracentesis was performed.   10/07/2019 -  Chemotherapy   The patient had neoadjuvant carboplatin and taxol for chemotherapy treatment.     10/08/2019 Imaging   CT angiogram of the abdominal aorta and iliofemoral test was done showing 14 x 10 x 14 cm pelvic mass with mild to moderate ascites.  Normal appearance of arterial structures without evidence of compression.  No evidence of compression of the iliac vein or IVC.   10/11/2019 Imaging   Ultrasound shows gallbladder full of sludge.  Fatty liver changes.  Ascites present.   10/18/2019 Cancer Staging   Staging form: Ovary, Fallopian Tube, and Primary Peritoneal Carcinoma, AJCC 8th Edition - Clinical stage from 10/18/2019: FIGO Stage IIIC, calculated as Stage Unknown (cT3c, cNX, cM0) - Signed by Heath Lark, MD on 10/18/2019   10/21/2019 Tumor Marker   Patient's tumor was tested for the following markers: CA-125. Results of the tumor marker test revealed 257.   10/31/2019 -  Chemotherapy   The patient had carboplatin and taxol for chemotherapy treatment.     11/22/2019 Tumor Marker   Patient's tumor was tested for the following markers: CA-125. Results of the tumor  marker test revealed 30.7   12/12/2019 Imaging   1. Today's study demonstrates a positive response to therapy with regression of right ovarian lesion(s), and decreased volume of presumably malignant ascites. 2. No definitive evidence of metastatic disease in the thorax. Multiple tiny 2-4 mm pulmonary nodules are stable compared to prior examinations. Continued attention on follow-up studies is recommended to exclude the possibility of metastatic lesions    12/26/2019 Tumor Marker   Patient's tumor was tested for the following markers: CA-125 Results of the tumor marker test revealed 16.4   01/10/2020 Surgery   Surgeon: Donaciano Eva    Assistants: Dr Lahoma Crocker (an MD assistant was necessary for tissue manipulation, management of robotic instrumentation, retraction and positioning due to the complexity of the case and hospital policies).  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    Post-operative Diagnosis: same   Operation: Robotic-assisted laparoscopic total hysterectomy with bilateral salpingoophorectomy, omentectomy, radical tumor debulking     Operative Findings:  : bulky right ovarian mass (approximately 6cm), mild nodularity to left ovary, tumor nodules on peritoneum of pelvis (all resected), no gross omental disease. No gross residual tumor at the completion of the procedure representing a complete (optimal) cytoreduction   01/10/2020 Pathology Results   Clinical History: Ovarian cancer ([facility])   FINAL MICROSCOPIC DIAGNOSIS:   A. UTERUS, CERVIX, BILATERAL TUBES AND OVARIES:  -  Right ovary:       Microscopic foci of adenocarcinoma associated with extensive necrosis and hemosiderin deposition.       No ovarian surface involvement identified.       Paraovarian necrotic and hemorrhagic nodule.       See oncology table and comment.  - Left Fallopian tube:        Microscopic focus of adenocarcinoma involving submucosa.       See comment.  - Cervix:       Nabothian cyst.       No dysplasia or malignancy.  - Endometrium:       Inactive.       No hyperplasia or malignancy.  - Right Fallopian tube:       Adhesions with focal hemosiderin deposition.       No malignancy identified.  - Left ovary:       Endosalpingosis.       No malignancy identified.   B. PERITONEAL NODULE, BIOPSY:  - Necrotic and hemorrhagic nodule with hemosiderin deposition.  - No viable malignancy identified.   C. OMENTUM:  - Foci of fibrosis associated with adhesions and hemosiderin deposition.  - No malignancy identified.  - One benign lymph node (0/1).   ONCOLOGY TABLE:  OVARY or FALLOPIAN TUBE or PRIMARY PERITONEUM: Resection  Procedure: Hysterectomy with bilateral salpingo-oophorectomy, peritoneal nodule biopsy and omentectomy.  Specimen Integrity: Intact.  Tumor Site: Right ovary.  Tumor Size: 5.2 x 4.5 x 3.8 cm, see comment.  Histologic Type: Endometrioid adenocarcinoma.  Histologic Grade: FIGO grade 2.  Ovarian Surface Involvement: Not identified.  Fallopian Tube Surface Involvement: Not identified.  See comment.  Other Tissue/ Organ Involvement: Not identified.  Peritoneal/Ascitic Fluid Involvement: Not applicable.  Chemotherapy Response Score (CRS): Near complete response, CRS 3.  See  comment.  Regional Lymph Nodes: No lymph nodes submitted.  Distant Metastasis:       Distant Site(s) Involved: Not identified.  Pathologic Stage Classification (pTNM, AJCC 8th Edition): ypT2a, ypNX  Ancillary Studies: Can be performed if requested.  Representative Tumor Block: A6 and A7.    02/29/2020 Tumor Marker   Patient's tumor was tested for the following markers: CA-125 Results of the tumor marker test revealed 14.2   03/02/2020 Genetic Testing   Negative hereditary cancer genetic testing: no pathogenic variants detected in [facility] CustomNext-Cancer +RNAinsight Panel.   The report date is March 02, 2020.  HRD testing was unable to be performed due to insufficient sample availability.   The CustomNext-Cancer+RNAinsight panel offered by [facility] includes sequencing and rearrangement analysis for the following 47 genes:  APC, ATM, AXIN2, BARD1, BMPR1A, BRCA1, BRCA2, BRIP1, CDH1, CDK4, CDKN2A, CHEK2, DICER1, EPCAM, GREM1, HOXB13, MEN1, MLH1, MSH2, MSH3, MSH6, MUTYH, NBN, NF1, NF2, NTHL1, PALB2, PMS2, POLD1, POLE, PTEN, RAD51C, RAD51D, RECQL, RET, SDHA, SDHAF2, SDHB, SDHC, SDHD, SMAD4, SMARCA4, STK11, TP53, TSC1, TSC2, and VHL.  RNA data is routinely analyzed for use in variant interpretation for all genes.     FAMILY HISTORY:  We obtained a detailed, 4-generation family history.  Significant diagnoses are listed below: Family History  Problem Relation Age of Onset  . Leukemia Mother 91  . Colon cancer Father 73  . Prostate cancer Father 56  . Breast cancer Maternal Grandmother 66  . Leukemia Maternal Grandfather 36  . Colon cancer Paternal Uncle 75  . Colon cancer Cousin 38       paternal female cousin  . Throat cancer Cousin 60       paternal  female cousin  . Prostate cancer Cousin        paternal cousin; dx early 26s; metastatic     [name] has two daughters and one son, all without a cancer history.  She has identical twin brothers who are 66 years old and do not have cancer.   [name]'s mother was diagnosed with leukemia at age 38 and passed away at age 81.  [name]'s maternal grandmother had breast cancer diagnosed at age 80.  Her maternal grandfather was diagnosed with leukemia at age 43.  No other maternal family history of cancer was reported.   [name]'s fater is living at age 58.  He has a history of colon cancer at age 28 and prostate cancer at age 98.  [name] has a paternal uncle with colon cancer diagnosed at 85, a paternal cousin with colon cancer diagnosed at 31, a paternal cousin diagnosed with metastastic prostate  cancer in his early 82s, and a paternal cousin with throat cancer diagnosed at age 20. No other paternal family history of cancer was reported.   [name] is unaware of previous family history of genetic testing for hereditary cancer risks. Patient's maternal ancestors are of [place] and [place] descent, and paternal ancestors are of [place] descent. There is no reported Ashkenazi Jewish ancestry. There is no known consanguinity.   GENETIC TEST RESULTS: Genetic testing reported out on March 01, 2020.  The [facility] CustomNext-Cancer +RNAinsight Panel found no pathogenic mutations. The CustomNext-Cancer+RNAinsight panel offered by [facility] includes sequencing and rearrangement analysis for the following 47 genes:  APC, ATM, AXIN2, BARD1, BMPR1A, BRCA1, BRCA2, BRIP1, CDH1, CDK4, CDKN2A, CHEK2, DICER1, EPCAM, GREM1, HOXB13, MEN1, MLH1, MSH2, MSH3, MSH6, MUTYH, NBN, NF1, NF2, NTHL1, PALB2, PMS2, POLD1, POLE, PTEN, RAD51C, RAD51D, RECQL, RET, SDHA, SDHAF2, SDHB, SDHC, SDHD, SMAD4, SMARCA4, STK11, TP53, TSC1, TSC2, and VHL.  RNA data is routinely analyzed for use in variant interpretation for all genes.  The test report has been scanned into EPIC and is located under the Molecular Pathology section of the Results Review tab.  A portion of the result report is included below for reference.     We discussed with [name] that because current genetic testing is not perfect, it is possible there may be a gene mutation in one of these genes that current testing cannot detect, but that chance is small.  We also discussed, that there could be another gene that has not yet been discovered, or that we have not yet tested, that is responsible for the cancer diagnoses in the family. It is also possible there is a hereditary cause for the cancer in the family that [name] did not inherit and therefore was not identified in her testing.  Therefore, it is important to remain in touch with cancer genetics in the  future so that we can continue to offer [name] the most up to date genetic testing.   ADDITIONAL GENETIC TESTING: We discussed with [name] that her genetic testing was fairly extensive.  If there are genes identified to increase cancer risk that can be analyzed in the future, we would be happy to discuss and coordinate this testing at that time.    HRD testing of [name]'s tumor was unable to be performed due to sample limitations of the laboratory.   CANCER SCREENING RECOMMENDATIONS: [name]'s test result is considered negative (normal).  This means that we have not identified a hereditary cause for her personal history of  cancer at this time. Most cancers happen by chance and this negative test suggests that her cancer may fall into this category.    While reassuring, this does not definitively rule out a hereditary predisposition to cancer. It is still possible that there could be genetic mutations that are undetectable by current technology. There could be genetic mutations in genes that have not been tested or identified to increase cancer risk.  Therefore, it is recommended she continue to follow the cancer management and screening guidelines provided by her oncology and primary healthcare provider.   An individual's cancer risk and medical management are not determined by genetic test results alone. Overall cancer risk assessment incorporates additional factors, including personal medical history, family history, and any available genetic information that may result in a personalized plan for cancer prevention and surveillance  RECOMMENDATIONS FOR FAMILY MEMBERS:  Individuals in this family might be at some increased risk of developing cancer, over the general population risk, simply due to the family history of cancer.  We recommended women in this family have a yearly mammogram beginning at age 60, or 55 years younger than the earliest onset of cancer, an annual clinical breast  exam, and perform monthly breast self-exams. Women in this family should also have a gynecological exam as recommended by their primary provider.   First degree relatives of those with colon cancer should receive colonoscopies beginning at age 15, or 10 years prior to the earliest diagnosis of colon cancer in the family, and receive colonoscopies at least every 5 years, or as recommended by their gastroenterologist.  Other family members should be referred for colonoscopy starting at age 59.  It is also possible there is a hereditary cause for the cancer in [name]'s family that she did not inherit and therefore was not identified in her.  Based on [name]'s family history, we recommended her father, who was diagnosed with colon cancer, have genetic counseling and testing. [name] will let us know if we can be of any assistance in coordinating genetic counseling and/or testing for this family member.   FOLLOW-UP: Lastly, we discussed with [name] that cancer genetics is a rapidly advancing field and it is possible that new genetic tests will be appropriate for her and/or her family members in the future. We encouraged her to remain in contact with cancer genetics on an annual basis so we can update her personal and family histories and let her know of advances in cancer genetics that may benefit this family.   Our contact number was provided. [name]'s questions were answered to her satisfaction, and she knows she is welcome to call us at anytime with additional questions or concerns.     Renessa Wellnitz M. Joette Catching, [place] Genetic Counselor [email or web address] (P) [phone]

## 2020-03-09 NOTE — Telephone Encounter (Signed)
Revealed negative genetic testing.  Discussed that we do not know why she has ovarian cancer or why there is cancer in the family. It could be sporadic, due to a different gene that we are not testing, or maybe our current technology may not be able to pick something up.  It will be important for her to keep in contact with genetics to keep up with whether additional testing may be needed.  Of note, HRD testing was unable to be performed due to insufficient sample.

## 2020-03-14 MED FILL — ELIQUIS 5 MG TABLET: 5 | 30 days supply | Qty: 60 | Fill #4

## 2020-03-15 ENCOUNTER — Telehealth (HOSPITAL_COMMUNITY): Payer: Self-pay

## 2020-03-15 NOTE — Telephone Encounter (Signed)
Called to schedule ivc filter retrieval, no answer, left vm. AW 

## 2020-03-21 ENCOUNTER — Inpatient Hospital Stay (HOSPITAL_BASED_OUTPATIENT_CLINIC_OR_DEPARTMENT_OTHER): Payer: 59 | Admitting: Hematology and Oncology

## 2020-03-21 ENCOUNTER — Inpatient Hospital Stay: Payer: 59 | Attending: Hematology and Oncology

## 2020-03-21 ENCOUNTER — Encounter: Payer: Self-pay | Admitting: Hematology and Oncology

## 2020-03-21 ENCOUNTER — Telehealth: Payer: Self-pay | Admitting: Hematology and Oncology

## 2020-03-21 ENCOUNTER — Inpatient Hospital Stay: Payer: 59

## 2020-03-21 ENCOUNTER — Other Ambulatory Visit: Payer: Self-pay

## 2020-03-21 VITALS — BP 147/76 | HR 83 | Temp 97.6°F | Resp 18 | Ht 64.0 in | Wt 162.0 lb

## 2020-03-21 DIAGNOSIS — C569 Malignant neoplasm of unspecified ovary: Secondary | ICD-10-CM | POA: Diagnosis not present

## 2020-03-21 DIAGNOSIS — R180 Malignant ascites: Secondary | ICD-10-CM

## 2020-03-21 DIAGNOSIS — D61818 Other pancytopenia: Secondary | ICD-10-CM | POA: Diagnosis not present

## 2020-03-21 DIAGNOSIS — I2699 Other pulmonary embolism without acute cor pulmonale: Secondary | ICD-10-CM | POA: Diagnosis not present

## 2020-03-21 DIAGNOSIS — G609 Hereditary and idiopathic neuropathy, unspecified: Secondary | ICD-10-CM

## 2020-03-21 DIAGNOSIS — R918 Other nonspecific abnormal finding of lung field: Secondary | ICD-10-CM

## 2020-03-21 DIAGNOSIS — C561 Malignant neoplasm of right ovary: Secondary | ICD-10-CM | POA: Insufficient documentation

## 2020-03-21 DIAGNOSIS — I824Z3 Acute embolism and thrombosis of unspecified deep veins of distal lower extremity, bilateral: Secondary | ICD-10-CM

## 2020-03-21 DIAGNOSIS — Z5111 Encounter for antineoplastic chemotherapy: Secondary | ICD-10-CM | POA: Diagnosis not present

## 2020-03-21 DIAGNOSIS — C786 Secondary malignant neoplasm of retroperitoneum and peritoneum: Secondary | ICD-10-CM | POA: Insufficient documentation

## 2020-03-21 DIAGNOSIS — Z79899 Other long term (current) drug therapy: Secondary | ICD-10-CM | POA: Insufficient documentation

## 2020-03-21 LAB — COMPREHENSIVE METABOLIC PANEL
ALT: 15 U/L (ref 0–44)
AST: 16 U/L (ref 15–41)
Albumin: 4.2 g/dL (ref 3.5–5.0)
Alkaline Phosphatase: 69 U/L (ref 38–126)
Anion gap: 12 (ref 5–15)
BUN: 21 mg/dL (ref 8–23)
CO2: 25 mmol/L (ref 22–32)
Calcium: 9.7 mg/dL (ref 8.9–10.3)
Chloride: 103 mmol/L (ref 98–111)
Creatinine, Ser: 0.93 mg/dL (ref 0.44–1.00)
GFR, Estimated: >60 mL/min (ref >60)
Glucose, Bld: 111 mg/dL — ABNORMAL HIGH (ref 70–99)
Potassium: 4 mmol/L (ref 3.5–5.1)
Sodium: 140 mmol/L (ref 135–145)
Total Bilirubin: 0.4 mg/dL (ref 0.3–1.2)
Total Protein: 8.1 g/dL (ref 6.5–8.1)

## 2020-03-21 LAB — CBC WITH DIFFERENTIAL/PLATELET
Abs Immature Granulocytes: 0.02 10*3/uL (ref 0.00–0.07)
Basophils Absolute: 0 10*3/uL (ref 0.0–0.1)
Basophils Relative: 0 %
Eosinophils Absolute: 0 10*3/uL (ref 0.0–0.5)
Eosinophils Relative: 0 %
HCT: 28.8 % — ABNORMAL LOW (ref 36.0–46.0)
Hemoglobin: 10.1 g/dL — ABNORMAL LOW (ref 12.0–15.0)
Immature Granulocytes: 0 %
Lymphocytes Relative: 11 %
Lymphs Abs: 0.6 10*3/uL — ABNORMAL LOW (ref 0.7–4.0)
MCH: 33.3 pg (ref 26.0–34.0)
MCHC: 35.1 g/dL (ref 30.0–36.0)
MCV: 95 fL (ref 80.0–100.0)
Monocytes Absolute: 0.3 10*3/uL (ref 0.1–1.0)
Monocytes Relative: 5 %
Neutro Abs: 4.6 10*3/uL (ref 1.7–7.7)
Neutrophils Relative %: 84 %
Platelets: 150 10*3/uL (ref 150–400)
RBC: 3.03 MIL/uL — ABNORMAL LOW (ref 3.87–5.11)
RDW: 13.4 % (ref 11.5–15.5)
WBC: 5.5 10*3/uL (ref 4.0–10.5)
nRBC: 0 % (ref 0.0–0.2)

## 2020-03-21 MED ORDER — SODIUM CHLORIDE 0.9 % IV SOLN
150.0000 mg | Freq: Once | INTRAVENOUS | Status: AC
  Administered 2020-03-21: 150 mg via INTRAVENOUS
  Filled 2020-03-21: qty 150

## 2020-03-21 MED ORDER — SODIUM CHLORIDE 0.9 % IV SOLN
140.0000 mg/m2 | Freq: Once | INTRAVENOUS | Status: AC
  Administered 2020-03-21: 252 mg via INTRAVENOUS
  Filled 2020-03-21: qty 42

## 2020-03-21 MED ORDER — PALONOSETRON HCL INJECTION 0.25 MG/5ML
INTRAVENOUS | Status: AC
  Filled 2020-03-21: qty 5

## 2020-03-21 MED ORDER — SODIUM CHLORIDE 0.9 % IV SOLN
572.4000 mg | Freq: Once | INTRAVENOUS | Status: AC
  Administered 2020-03-21: 570 mg via INTRAVENOUS
  Filled 2020-03-21: qty 57

## 2020-03-21 MED ORDER — FAMOTIDINE IN NACL 20-0.9 MG/50ML-% IV SOLN
20.0000 mg | Freq: Once | INTRAVENOUS | Status: AC
  Administered 2020-03-21: 20 mg via INTRAVENOUS

## 2020-03-21 MED ORDER — SODIUM CHLORIDE 0.9 % IV SOLN
10.0000 mg | Freq: Once | INTRAVENOUS | Status: AC
  Administered 2020-03-21: 10 mg via INTRAVENOUS
  Filled 2020-03-21: qty 10

## 2020-03-21 MED ORDER — DIPHENHYDRAMINE HCL 50 MG/ML IJ SOLN
INTRAMUSCULAR | Status: AC
  Filled 2020-03-21: qty 1

## 2020-03-21 MED ORDER — PALONOSETRON HCL INJECTION 0.25 MG/5ML
0.2500 mg | Freq: Once | INTRAVENOUS | Status: AC
  Administered 2020-03-21: 0.25 mg via INTRAVENOUS

## 2020-03-21 MED ORDER — DIPHENHYDRAMINE HCL 50 MG/ML IJ SOLN
12.5000 mg | Freq: Once | INTRAMUSCULAR | Status: AC
  Administered 2020-03-21: 12.5 mg via INTRAVENOUS

## 2020-03-21 MED ORDER — SODIUM CHLORIDE 0.9 % IV SOLN
Freq: Once | INTRAVENOUS | Status: AC
Start: 2020-03-21 — End: 2020-03-21
  Filled 2020-03-21: qty 250

## 2020-03-21 MED ORDER — FAMOTIDINE IN NACL 20-0.9 MG/50ML-% IV SOLN
INTRAVENOUS | Status: AC
  Filled 2020-03-21: qty 50

## 2020-03-21 NOTE — Assessment & Plan Note (Signed)
She will continue anticoagulation therapy for at least a year from the date of diagnosis, until the end of the year 

## 2020-03-21 NOTE — Assessment & Plan Note (Signed)
This is due to her recent treatment She will continue oral vitamin B12 supplement We will proceed with treatment without delay

## 2020-03-21 NOTE — Assessment & Plan Note (Signed)
She has stable peripheral neuropathy We will continue current prescribed dose of Taxol She will continue cryotherapy

## 2020-03-21 NOTE — Telephone Encounter (Signed)
Scheduled appts per 3/9 sch msg. Pt stated she would refer to mychart for AVS and appts.

## 2020-03-21 NOTE — Assessment & Plan Note (Signed)
She tolerated last cycle of treatment well without major side effects except for very mild neuropathy We will proceed with chemotherapy as scheduled  She will complete her last cycle of treatment today I plan to repeat CT imaging in a month

## 2020-03-21 NOTE — Progress Notes (Signed)
[facility] OFFICE PROGRESS NOTE  Patient Care Team: Ginger Organ., MD as PCP - General (Internal Medicine) Awanda Mink Craige Cotta, RN as Oncology Nurse Navigator (Oncology)  ASSESSMENT & PLAN:  Ovarian cancer [facility]) She tolerated last cycle of treatment well without major side effects except for very mild neuropathy We will proceed with chemotherapy as scheduled  She will complete her last cycle of treatment today I plan to repeat CT imaging in a month   Pancytopenia, acquired [facility]) This is due to her recent treatment She will continue oral vitamin B12 supplement We will proceed with treatment without delay  Acute pulmonary embolism ([place]) She will continue anticoagulation therapy for at least a year from the date of diagnosis, until the end of the year  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 CT ABDOMEN PELVIS W CONTRAST    Standing Status:   Future    Standing Expiration Date:   03/21/2021    Order Specific Question:   If indicated for the ordered procedure, I authorize the administration of contrast media per Radiology protocol    Answer:   Yes    Order Specific Question:   Preferred imaging location?    Answer:   [facility]    Order Specific Question:   Radiology Contrast Protocol - do NOT remove file path    Answer:   [email or web address]    All questions were answered. The patient knows to call the clinic with any problems, questions or concerns. The total time spent in the appointment was 30 minutes encounter with patients including review of chart and various tests results, discussions about plan of care and coordination of care plan   Heath Lark, MD 03/21/2020 12:06 PM  INTERVAL HISTORY: Please see below for problem oriented charting. She returns for cycle 6 of  treatment today She is doing well Her neuropathy is stable She denies nausea or changes in bowel habits The patient denies any recent signs or symptoms of bleeding such as spontaneous epistaxis, hematuria or hematochezia. She has several questions related to future plan of care SUMMARY OF ONCOLOGIC HISTORY: Oncology History Overview Note  Neg Genetics, unable to do HRD   Ovarian cancer ([place])  09/20/2019 Initial Diagnosis   Presented to her primary care doctor with abdominal bloating.  Abdominal x-ray imaging was unremarkable.   09/29/2019 Imaging   CT of the chest showed pulmonary embolism in all lobes.  Large right/central pelvic mass likely ovarian neoplasm with omental/peritoneal metastasis with significant free fluid in the abdomen and pelvis.  A small left uterine calcification could be seen in a degenerating fibroid.  Small right and left lung base nodules, could represent sequela of previous infection or inflammation with metastasis not excluded.   09/29/2019 - 10/15/2019 Hospital Admission   She presented to the emergency department in [name] after complaints of leg pain with bilateral lower extremity edema.  D-dimer was elevated.  Lower extremity ultrasound demonstrated extensive bilateral DVT involving peroneal and soleal calf veins on the left and peroneal vein on the right.  CT abdomen and pelvis showed large pelvic mass with omental metastasis and ascites, worrisome for ovarian cancer.  CT imaging of the chest showed bilateral pulmonary nodules as well as evidence of pulmonary embolism.  She received anticoagulation therapy.  She was also found to be anemic, worrisome that she might have bleeding in the  tumor and received transfusion.  Echocardiogram showed preserved ejection fraction but evidence of moderate right ventricular strain.  Subsequent biopsy showed cancer suspicious for ovarian primary and she received first cycle of neoadjuvant chemotherapy with carboplatin and taxol. She had IVC  filter placement and temporary TPN   09/30/2019 Procedure   Ultrasound paracentesis was performed complicated by bleeding and received blood transfusion   09/30/2019 Procedure   She had IVC filter placement by interventional radiologist in [facility].   09/30/2019 Echocardiogram   Outside echocardiogram showed left ventricular ejection fraction around 79%.  Concentric left ventricular modeling.  Grade 1 diastolic pattern with normal left atrial pressure.  Right ventricle size is mildly enlarged.  Right ventricular systolic function is moderately reduced.  Right ventricle was not well visualized.  Aortic sclerosis without evidence of stenosis.  Moderate pleural effusion on the left region   10/02/2019 Procedure   EGD was performed which showed no evidence of acute bleeding.  Biopsy of the stomach showed benign gastric mucosa with reactive gastropathy as may be seen in the healing phase of erosive gastritis.  Helicobacter organism testing is negative..   10/03/2019 Procedure   She underwent CT-guided biopsy of the pelvic mass.   10/03/2019 Tumor Marker   CEA level was 2.9.   10/03/2019 Pathology Results   Biopsy show adenocarcinoma.  The malignant cells stain positive for keratin AE1/AE3, CK7 and MOC-31.  Rare cells show weak reactivity with p63 and GA TA 3.  Cells are negative for CK20, calretinin, CDX2, TTF-1, ER and WT-1.  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  Possible consideration include, but not limited to, gynecological origin, pancreatico biliary and urothelial.   10/03/2019 Tumor Marker   Patient's tumor was tested for the following markers: CA-125 Results of the tumor marker test revealed 713 CA19-9 is 100   10/04/2019 Imaging   Tagged red blood cell scan equivocal for source of bleeding.   10/05/2019 Procedure   She underwent paracentesis.  IR removed 4100 cc of ascites fluid with significant blood  noted.   10/05/2019 Imaging   CT abdomen and pelvis angiogram protocol show no obvious source of bleeding.   10/05/2019 Imaging   CT imaging of the abdomen and pelvis showed no evidence of postprocedural hemorrhage or discrete area of GI bleeding.  Large volume ascites is present.  Small pockets of intraperitoneal free air are present, likely related to recent paracentesis.  Abnormal mass in the pelvic region most likely a neoplasm of uterine or ovarian origin.  Normal liver with benign cysts of the right hepatic dome.  Biliary tree and pancreas and spleen were age-appropriate   10/07/2019 Procedure   Ultrasound paracentesis was performed.   10/07/2019 -  Chemotherapy   The patient had neoadjuvant carboplatin and taxol for chemotherapy treatment.     10/08/2019 Imaging   CT angiogram of the abdominal aorta and iliofemoral test was done showing 14 x 10 x 14 cm pelvic mass with mild to moderate ascites.  Normal appearance of arterial structures without evidence of compression.  No evidence of compression of the iliac vein or IVC.   10/11/2019 Imaging   Ultrasound shows gallbladder full of sludge.  Fatty liver changes.  Ascites present.   10/18/2019 Cancer Staging   Staging form: Ovary, Fallopian Tube, and Primary Peritoneal Carcinoma, AJCC 8th Edition - Clinical stage from 10/18/2019: FIGO Stage IIIC, calculated as Stage Unknown (cT3c, cNX, cM0) - Signed by Heath Lark, MD on 10/18/2019  10/21/2019 Tumor Marker   Patient's tumor was tested for the following markers: CA-125. Results of the tumor marker test revealed 257.   10/31/2019 -  Chemotherapy   The patient had carboplatin and taxol for chemotherapy treatment.     11/22/2019 Tumor Marker   Patient's tumor was tested for the following markers: CA-125. Results of the tumor marker test revealed 30.7   12/12/2019 Imaging   1. Today's study demonstrates a positive response to therapy with regression of right ovarian lesion(s), and decreased  volume of presumably malignant ascites. 2. No definitive evidence of metastatic disease in the thorax. Multiple tiny 2-4 mm pulmonary nodules are stable compared to prior examinations. Continued attention on follow-up studies is recommended to exclude the possibility of metastatic lesions    12/26/2019 Tumor Marker   Patient's tumor was tested for the following markers: CA-125 Results of the tumor marker test revealed 16.4   01/10/2020 Surgery   Surgeon: Donaciano Eva    Assistants: Dr Lahoma Crocker (an MD assistant was necessary for tissue manipulation, management of robotic instrumentation, retraction and positioning due to the complexity of the case and hospital policies).  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    Post-operative Diagnosis: same   Operation: Robotic-assisted laparoscopic total hysterectomy with bilateral salpingoophorectomy, omentectomy, radical tumor debulking     Operative Findings:  : bulky right ovarian mass (approximately 6cm), mild nodularity to left ovary, tumor nodules on peritoneum of pelvis (all resected), no gross omental disease. No gross residual tumor at the completion of the procedure representing a complete (optimal) cytoreduction   01/10/2020 Pathology Results   Clinical History: Ovarian cancer ([facility])   FINAL MICROSCOPIC DIAGNOSIS:   A. UTERUS, CERVIX, BILATERAL TUBES AND OVARIES:  - Right ovary:       Microscopic foci of adenocarcinoma associated with extensive necrosis and hemosiderin deposition.       No ovarian surface involvement identified.       Paraovarian necrotic and hemorrhagic nodule.       See oncology table and comment.  - Left Fallopian tube:       Microscopic focus of adenocarcinoma involving submucosa.       See comment.  - Cervix:       Nabothian cyst.       No dysplasia or malignancy.  - Endometrium:        Inactive.       No hyperplasia or malignancy.  - Right Fallopian tube:       Adhesions with focal hemosiderin deposition.       No malignancy identified.  - Left ovary:       Endosalpingosis.       No malignancy identified.   B. PERITONEAL NODULE, BIOPSY:  - Necrotic and hemorrhagic nodule with hemosiderin deposition.  - No viable malignancy identified.   C. OMENTUM:  - Foci of fibrosis associated with adhesions and hemosiderin deposition.  - No malignancy identified.  - One benign lymph node (0/1).   ONCOLOGY TABLE:  OVARY or FALLOPIAN TUBE or PRIMARY PERITONEUM: Resection  Procedure: Hysterectomy with bilateral salpingo-oophorectomy, peritoneal nodule biopsy and omentectomy.  Specimen Integrity: Intact.  Tumor Site: Right ovary.  Tumor Size: 5.2 x 4.5 x 3.8 cm, see comment.  Histologic Type: Endometrioid adenocarcinoma.  Histologic Grade: FIGO grade 2.  Ovarian Surface Involvement: Not identified.  Fallopian Tube Surface Involvement: Not identified.  See comment.  Other Tissue/ Organ Involvement: Not  identified.  Peritoneal/Ascitic Fluid Involvement: Not applicable.  Chemotherapy Response Score (CRS): Near complete response, CRS 3.  See  comment.  Regional Lymph Nodes: No lymph nodes submitted.  Distant Metastasis:       Distant Site(s) Involved: Not identified.  Pathologic Stage Classification (pTNM, AJCC 8th Edition): ypT2a, ypNX  Ancillary Studies: Can be performed if requested.  Representative Tumor Block: A6 and A7.    02/29/2020 Tumor Marker   Patient's tumor was tested for the following markers: CA-125 Results of the tumor marker test revealed 14.2   03/02/2020 Genetic Testing   Negative hereditary cancer genetic testing: no pathogenic variants detected in [facility] CustomNext-Cancer +RNAinsight Panel.  The report date is March 02, 2020.  HRD testing was unable to be performed due to insufficient sample availability.   The CustomNext-Cancer+RNAinsight panel offered  by [facility] includes sequencing and rearrangement analysis for the following 47 genes:  APC, ATM, AXIN2, BARD1, BMPR1A, BRCA1, BRCA2, BRIP1, CDH1, CDK4, CDKN2A, CHEK2, DICER1, EPCAM, GREM1, HOXB13, MEN1, MLH1, MSH2, MSH3, MSH6, MUTYH, NBN, NF1, NF2, NTHL1, PALB2, PMS2, POLD1, POLE, PTEN, RAD51C, RAD51D, RECQL, RET, SDHA, SDHAF2, SDHB, SDHC, SDHD, SMAD4, SMARCA4, STK11, TP53, TSC1, TSC2, and VHL.  RNA data is routinely analyzed for use in variant interpretation for all genes.     REVIEW OF SYSTEMS:   Constitutional: Denies fevers, chills or abnormal weight loss Eyes: Denies blurriness of vision Ears, nose, mouth, throat, and face: Denies mucositis or sore throat Respiratory: Denies cough, dyspnea or wheezes Cardiovascular: Denies palpitation, chest discomfort or lower extremity swelling Gastrointestinal:  Denies nausea, heartburn or change in bowel habits Skin: Denies abnormal skin rashes Lymphatics: Denies new lymphadenopathy or easy bruising Behavioral/Psych: Mood is stable, no new changes  All other systems were reviewed with the patient and are negative.  I have reviewed the past medical history, past surgical history, social history and family history with the patient and they are unchanged from previous note.  ALLERGIES:  has No Known Allergies.  MEDICATIONS:  Current Outpatient Medications  Medication Sig Dispense Refill  . cetirizine (ZYRTEC) 10 MG chewable tablet Chew 10 mg by mouth daily.    . Cholecalciferol 1000 UNITS capsule Take 2,000 Units by mouth daily.    Marland Kitchen dexamethasone (DECADRON) 4 MG tablet Take 2 tabs at the night before and 2 tab the morning of chemotherapy, every 3 weeks, for total of 2 more cycles, please dispense 8 pills 8 tablet 0  . ELIQUIS 5 MG TABS tablet Take 1 tablet (5 mg total) by mouth 2 (two) times daily. 60 tablet 11  . gabapentin (NEURONTIN) 300 MG capsule Take 300 mg by mouth 3 (three) times daily.    . Melatonin 10 MG TABS Take 10 mg by mouth  at bedtime.    . ondansetron (ZOFRAN) 8 MG tablet Take by mouth every 8 (eight) hours as needed for nausea or vomiting. (Patient not taking: Reported on 01/24/2020)    . pantoprazole (PROTONIX) 40 MG tablet Take 1 tablet (40 mg total) by mouth daily. 90 tablet 1  . prochlorperazine (COMPAZINE) 10 MG tablet Take 1 tablet (10 mg total) by mouth every 6 (six) hours as needed (Nausea or vomiting). (Patient not taking: Reported on 01/24/2020) 30 tablet 1  . vitamin B-12 (CYANOCOBALAMIN) 1000 MCG tablet Take 1,000 mcg by mouth daily.     No current facility-administered medications for this visit.    PHYSICAL EXAMINATION: ECOG PERFORMANCE STATUS: 1 - Symptomatic but completely ambulatory  Vitals:   03/21/20 1129 03/21/20  1132  BP: (!) 187/89 (!) 147/76  Pulse: 83   Resp: 18   Temp: 97.6 F (36.4 C)   SpO2: 100%    Filed Weights   03/21/20 1129  Weight: 162 lb (73.5 kg)    GENERAL:alert, no distress and comfortable SKIN: skin color, texture, turgor are normal, no rashes or significant lesions EYES: normal, Conjunctiva are pink and non-injected, sclera clear OROPHARYNX:no exudate, no erythema and lips, buccal mucosa, and tongue normal  NECK: supple, thyroid normal size, non-tender, without nodularity LYMPH:  no palpable lymphadenopathy in the cervical, axillary or inguinal LUNGS: clear to auscultation and percussion with normal breathing effort HEART: regular rate & rhythm and no murmurs and no lower extremity edema ABDOMEN:abdomen soft, non-tender and normal bowel sounds Musculoskeletal:no cyanosis of digits and no clubbing  NEURO: alert & oriented x 3 with fluent speech, no focal motor/sensory deficits  LABORATORY DATA:  I have reviewed the data as listed    Component Value Date/Time   NA 140 03/21/2020 1111   NA 142 02/28/2019 1529   K 4.0 03/21/2020 1111   CL 103 03/21/2020 1111   CO2 25 03/21/2020 1111   GLUCOSE 111 (H) 03/21/2020 1111   BUN 21 03/21/2020 1111   BUN 14  02/28/2019 1529   CREATININE 0.93 03/21/2020 1111   CREATININE 0.79 05/28/2019 1156   CALCIUM 9.7 03/21/2020 1111   PROT 8.1 03/21/2020 1111   PROT 6.7 02/28/2019 1529   ALBUMIN 4.2 03/21/2020 1111   ALBUMIN 4.3 02/28/2019 1529   AST 16 03/21/2020 1111   ALT 15 03/21/2020 1111   ALKPHOS 69 03/21/2020 1111   BILITOT 0.4 03/21/2020 1111   BILITOT <0.2 02/28/2019 1529   GFRNONAA >60 03/21/2020 1111   GFRNONAA 81 05/28/2019 1156   GFRAA 94 05/28/2019 1156    No results found for: SPEP, UPEP  Lab Results  Component Value Date   WBC 4.4 02/29/2020   NEUTROABS 3.7 02/29/2020   HGB 10.6 (L) 02/29/2020   HCT 30.5 (L) 02/29/2020   MCV 95.9 02/29/2020   PLT 134 (L) 02/29/2020      Chemistry      Component Value Date/Time   NA 140 03/21/2020 1111   NA 142 02/28/2019 1529   K 4.0 03/21/2020 1111   CL 103 03/21/2020 1111   CO2 25 03/21/2020 1111   BUN 21 03/21/2020 1111   BUN 14 02/28/2019 1529   CREATININE 0.93 03/21/2020 1111   CREATININE 0.79 05/28/2019 1156      Component Value Date/Time   CALCIUM 9.7 03/21/2020 1111   ALKPHOS 69 03/21/2020 1111   AST 16 03/21/2020 1111   ALT 15 03/21/2020 1111   BILITOT 0.4 03/21/2020 1111   BILITOT <0.2 02/28/2019 1529

## 2020-03-21 NOTE — Patient Instructions (Signed)
   [facility] Discharge Instructions for Patients Receiving Chemotherapy  Today you received the following chemotherapy agents Taxol and Carboplatin   To help prevent nausea and vomiting after your treatment, we encourage you to take your nausea medication as directed.    If you develop nausea and vomiting that is not controlled by your nausea medication, call the clinic.   BELOW ARE SYMPTOMS THAT SHOULD BE REPORTED IMMEDIATELY:  *FEVER GREATER THAN 100.5 F  *CHILLS WITH OR WITHOUT FEVER  NAUSEA AND VOMITING THAT IS NOT CONTROLLED WITH YOUR NAUSEA MEDICATION  *UNUSUAL SHORTNESS OF BREATH  *UNUSUAL BRUISING OR BLEEDING  TENDERNESS IN MOUTH AND THROAT WITH OR WITHOUT PRESENCE OF ULCERS  *URINARY PROBLEMS  *BOWEL PROBLEMS  UNUSUAL RASH Items with * indicate a potential emergency and should be followed up as soon as possible.  Feel free to call the clinic should you have any questions or concerns. The clinic phone number is [phone].  Please show the CHEMO ALERT CARD at check-in to the Emergency Department and triage nurse.   

## 2020-03-22 LAB — CA 125: Cancer Antigen (CA) 125: 11.5 U/mL (ref 0.0–38.1)

## 2020-04-18 ENCOUNTER — Other Ambulatory Visit (HOSPITAL_COMMUNITY): Payer: Self-pay

## 2020-04-18 MED FILL — Apixaban Tab 5 MG: ORAL | 30 days supply | Qty: 60 | Fill #0 | Status: AC

## 2020-04-18 MED FILL — Gabapentin Cap 300 MG: ORAL | 90 days supply | Qty: 270 | Fill #0 | Status: AC

## 2020-04-19 ENCOUNTER — Other Ambulatory Visit (HOSPITAL_COMMUNITY): Payer: Self-pay

## 2020-04-20 ENCOUNTER — Other Ambulatory Visit: Payer: Self-pay

## 2020-04-20 ENCOUNTER — Inpatient Hospital Stay: Payer: 59 | Attending: Hematology and Oncology

## 2020-04-20 DIAGNOSIS — G609 Hereditary and idiopathic neuropathy, unspecified: Secondary | ICD-10-CM | POA: Insufficient documentation

## 2020-04-20 DIAGNOSIS — C561 Malignant neoplasm of right ovary: Secondary | ICD-10-CM | POA: Diagnosis not present

## 2020-04-20 DIAGNOSIS — R918 Other nonspecific abnormal finding of lung field: Secondary | ICD-10-CM | POA: Diagnosis not present

## 2020-04-20 DIAGNOSIS — I2699 Other pulmonary embolism without acute cor pulmonale: Secondary | ICD-10-CM

## 2020-04-20 DIAGNOSIS — I824Z3 Acute embolism and thrombosis of unspecified deep veins of distal lower extremity, bilateral: Secondary | ICD-10-CM

## 2020-04-20 DIAGNOSIS — R180 Malignant ascites: Secondary | ICD-10-CM

## 2020-04-20 DIAGNOSIS — C569 Malignant neoplasm of unspecified ovary: Secondary | ICD-10-CM

## 2020-04-20 DIAGNOSIS — D61818 Other pancytopenia: Secondary | ICD-10-CM | POA: Diagnosis not present

## 2020-04-20 LAB — CBC WITH DIFFERENTIAL/PLATELET
Abs Immature Granulocytes: 0 10*3/uL (ref 0.00–0.07)
Basophils Absolute: 0 10*3/uL (ref 0.0–0.1)
Basophils Relative: 1 %
Eosinophils Absolute: 0.1 10*3/uL (ref 0.0–0.5)
Eosinophils Relative: 2 %
HCT: 28.1 % — ABNORMAL LOW (ref 36.0–46.0)
Hemoglobin: 9.9 g/dL — ABNORMAL LOW (ref 12.0–15.0)
Immature Granulocytes: 0 %
Lymphocytes Relative: 29 %
Lymphs Abs: 1.1 10*3/uL (ref 0.7–4.0)
MCH: 34.7 pg — ABNORMAL HIGH (ref 26.0–34.0)
MCHC: 35.2 g/dL (ref 30.0–36.0)
MCV: 98.6 fL (ref 80.0–100.0)
Monocytes Absolute: 0.4 10*3/uL (ref 0.1–1.0)
Monocytes Relative: 11 %
Neutro Abs: 2.2 10*3/uL (ref 1.7–7.7)
Neutrophils Relative %: 57 %
Platelets: 163 10*3/uL (ref 150–400)
RBC: 2.85 MIL/uL — ABNORMAL LOW (ref 3.87–5.11)
RDW: 14.6 % (ref 11.5–15.5)
WBC: 3.7 10*3/uL — ABNORMAL LOW (ref 4.0–10.5)
nRBC: 0 % (ref 0.0–0.2)

## 2020-04-20 LAB — COMPREHENSIVE METABOLIC PANEL
ALT: 14 U/L (ref 0–44)
AST: 24 U/L (ref 15–41)
Albumin: 4.2 g/dL (ref 3.5–5.0)
Alkaline Phosphatase: 64 U/L (ref 38–126)
Anion gap: 13 (ref 5–15)
BUN: 18 mg/dL (ref 8–23)
CO2: 26 mmol/L (ref 22–32)
Calcium: 9.1 mg/dL (ref 8.9–10.3)
Chloride: 103 mmol/L (ref 98–111)
Creatinine, Ser: 0.97 mg/dL (ref 0.44–1.00)
GFR, Estimated: >60 mL/min (ref >60)
Glucose, Bld: 89 mg/dL (ref 70–99)
Potassium: 3.7 mmol/L (ref 3.5–5.1)
Sodium: 142 mmol/L (ref 135–145)
Total Bilirubin: 0.3 mg/dL (ref 0.3–1.2)
Total Protein: 7.4 g/dL (ref 6.5–8.1)

## 2020-04-21 LAB — CA 125: Cancer Antigen (CA) 125: 9.3 U/mL (ref 0.0–38.1)

## 2020-04-23 ENCOUNTER — Other Ambulatory Visit: Payer: Self-pay

## 2020-04-23 ENCOUNTER — Ambulatory Visit (INDEPENDENT_AMBULATORY_CARE_PROVIDER_SITE_OTHER): Payer: 59

## 2020-04-23 DIAGNOSIS — R911 Solitary pulmonary nodule: Secondary | ICD-10-CM | POA: Diagnosis not present

## 2020-04-23 DIAGNOSIS — C569 Malignant neoplasm of unspecified ovary: Secondary | ICD-10-CM | POA: Diagnosis not present

## 2020-04-23 DIAGNOSIS — R188 Other ascites: Secondary | ICD-10-CM | POA: Diagnosis not present

## 2020-04-23 MED ORDER — IOHEXOL 350 MG/ML SOLN: 100.0000 mL | Freq: Once | INTRAVENOUS | Status: DC | PRN

## 2020-04-23 MED ORDER — IOHEXOL 300 MG/ML  SOLN
100.0000 mL | Freq: Once | INTRAMUSCULAR | Status: AC | PRN
  Administered 2020-04-23: 100 mL via INTRAVENOUS

## 2020-04-24 ENCOUNTER — Encounter: Payer: Self-pay | Admitting: Hematology and Oncology

## 2020-04-24 ENCOUNTER — Inpatient Hospital Stay (HOSPITAL_BASED_OUTPATIENT_CLINIC_OR_DEPARTMENT_OTHER): Payer: 59 | Admitting: Hematology and Oncology

## 2020-04-24 ENCOUNTER — Other Ambulatory Visit: Payer: Self-pay | Admitting: Hematology and Oncology

## 2020-04-24 DIAGNOSIS — C569 Malignant neoplasm of unspecified ovary: Secondary | ICD-10-CM

## 2020-04-24 DIAGNOSIS — I2699 Other pulmonary embolism without acute cor pulmonale: Secondary | ICD-10-CM

## 2020-04-24 DIAGNOSIS — D61818 Other pancytopenia: Secondary | ICD-10-CM

## 2020-04-24 DIAGNOSIS — R918 Other nonspecific abnormal finding of lung field: Secondary | ICD-10-CM

## 2020-04-24 DIAGNOSIS — G609 Hereditary and idiopathic neuropathy, unspecified: Secondary | ICD-10-CM | POA: Diagnosis not present

## 2020-04-24 DIAGNOSIS — C561 Malignant neoplasm of right ovary: Secondary | ICD-10-CM | POA: Diagnosis not present

## 2020-04-24 DIAGNOSIS — Z95828 Presence of other vascular implants and grafts: Secondary | ICD-10-CM

## 2020-04-24 NOTE — Assessment & Plan Note (Signed)
She has seen interventional radiologist about IVC filter retrieval There is no contraindication for her to proceed

## 2020-04-24 NOTE — Assessment & Plan Note (Signed)
She will continue anticoagulation therapy for at least a year from the date of diagnosis, until the end of the year

## 2020-04-24 NOTE — Assessment & Plan Note (Signed)
I reviewed multiple imaging studies with the patient She has complete response to therapy Moving forward, she will get alternate follow-up between GYN surgeon and myself every 3 months We will continue close monitoring of tumor marker The patient is educated to watch her for signs and symptoms of cancer recurrence

## 2020-04-24 NOTE — Progress Notes (Signed)
[facility] OFFICE PROGRESS NOTE  Patient Care Team: Ginger Organ., MD as PCP - General (Internal Medicine) Awanda Mink Craige Cotta, RN as Oncology Nurse Navigator (Oncology)  ASSESSMENT & PLAN:  Ovarian cancer [facility]) I reviewed multiple imaging studies with the patient She has complete response to therapy Moving forward, she will get alternate follow-up between GYN surgeon and myself every 3 months We will continue close monitoring of tumor marker The patient is educated to watch her for signs and symptoms of cancer recurrence  Pancytopenia, acquired [facility]) She has mild persistent pancytopenia due to prior treatment She is not symptomatic Observe closely Plan to recheck blood count again in 3 months  S/P IVC filter She has seen interventional radiologist about IVC filter retrieval There is no contraindication for her to proceed  Acute pulmonary embolism ([place]) She will continue anticoagulation therapy for at least a year from the date of diagnosis, until the end of the year  Multiple lung nodules on CT She had incidental lung nodules noted for a long time She is not symptomatic Observe closely for now  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   All questions were answered. The patient knows to call the clinic with any problems, questions or concerns. The total time spent in the appointment was 30 minutes encounter with patients including review of chart and various tests results, discussions about plan of care and coordination of care plan   Heath Lark, MD 04/24/2020 1:14 PM  INTERVAL HISTORY: Please see below for problem oriented charting. She returns to review test results She has mild residual neuropathy from treatment but stable Her hair has started to grow back She denies other new complaints  SUMMARY OF ONCOLOGIC  HISTORY: Oncology History Overview Note  Neg Genetics, unable to do HRD   Ovarian cancer ([place])  09/20/2019 Initial Diagnosis   Presented to her primary care doctor with abdominal bloating.  Abdominal x-ray imaging was unremarkable.   09/29/2019 Imaging   CT of the chest showed pulmonary embolism in all lobes.  Large right/central pelvic mass likely ovarian neoplasm with omental/peritoneal metastasis with significant free fluid in the abdomen and pelvis.  A small left uterine calcification could be seen in a degenerating fibroid.  Small right and left lung base nodules, could represent sequela of previous infection or inflammation with metastasis not excluded.   09/29/2019 - 10/15/2019 Hospital Admission   She presented to the emergency department in [name] after complaints of leg pain with bilateral lower extremity edema.  D-dimer was elevated.  Lower extremity ultrasound demonstrated extensive bilateral DVT involving peroneal and soleal calf veins on the left and peroneal vein on the right.  CT abdomen and pelvis showed large pelvic mass with omental metastasis and ascites, worrisome for ovarian cancer.  CT imaging of the chest showed bilateral pulmonary nodules as well as evidence of pulmonary embolism.  She received anticoagulation therapy.  She was also found to be anemic, worrisome that she might have bleeding in the tumor and received transfusion.  Echocardiogram showed preserved ejection fraction but evidence of moderate right ventricular strain.  Subsequent biopsy showed cancer suspicious for ovarian primary and she received first cycle of neoadjuvant chemotherapy with carboplatin and taxol. She had IVC filter placement and temporary TPN   09/30/2019 Procedure   Ultrasound paracentesis was performed complicated by bleeding and received blood transfusion   09/30/2019  Procedure   She had IVC filter placement by interventional radiologist in [facility].   09/30/2019 Echocardiogram   Outside echocardiogram  showed left ventricular ejection fraction around 79%.  Concentric left ventricular modeling.  Grade 1 diastolic pattern with normal left atrial pressure.  Right ventricle size is mildly enlarged.  Right ventricular systolic function is moderately reduced.  Right ventricle was not well visualized.  Aortic sclerosis without evidence of stenosis.  Moderate pleural effusion on the left region   10/02/2019 Procedure   EGD was performed which showed no evidence of acute bleeding.  Biopsy of the stomach showed benign gastric mucosa with reactive gastropathy as may be seen in the healing phase of erosive gastritis.  Helicobacter organism testing is negative..   10/03/2019 Procedure   She underwent CT-guided biopsy of the pelvic mass.   10/03/2019 Tumor Marker   CEA level was 2.9.   10/03/2019 Pathology Results   Biopsy show adenocarcinoma.  The malignant cells stain positive for keratin AE1/AE3, CK7 and MOC-31.  Rare cells show weak reactivity with p63 and GA TA 3.  Cells are negative for CK20, calretinin, CDX2, TTF-1, ER and WT-1.  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  Possible consideration include, but not limited to, gynecological origin, pancreatico biliary and urothelial.   10/03/2019 Tumor Marker   Patient's tumor was tested for the following markers: CA-125 Results of the tumor marker test revealed 713 CA19-9 is 100   10/04/2019 Imaging   Tagged red blood cell scan equivocal for source of bleeding.   10/05/2019 Procedure   She underwent paracentesis.  IR removed 4100 cc of ascites fluid with significant blood noted.   10/05/2019 Imaging   CT abdomen and pelvis angiogram protocol show no obvious source of bleeding.   10/05/2019 Imaging   CT imaging of the abdomen and pelvis showed no evidence of postprocedural hemorrhage or discrete area of GI bleeding.  Large volume ascites is present.  Small pockets of  intraperitoneal free air are present, likely related to recent paracentesis.  Abnormal mass in the pelvic region most likely a neoplasm of uterine or ovarian origin.  Normal liver with benign cysts of the right hepatic dome.  Biliary tree and pancreas and spleen were age-appropriate   10/07/2019 Procedure   Ultrasound paracentesis was performed.   10/07/2019 -  Chemotherapy   The patient had neoadjuvant carboplatin and taxol for chemotherapy treatment.     10/08/2019 Imaging   CT angiogram of the abdominal aorta and iliofemoral test was done showing 14 x 10 x 14 cm pelvic mass with mild to moderate ascites.  Normal appearance of arterial structures without evidence of compression.  No evidence of compression of the iliac vein or IVC.   10/11/2019 Imaging   Ultrasound shows gallbladder full of sludge.  Fatty liver changes.  Ascites present.   10/18/2019 Cancer Staging   Staging form: Ovary, Fallopian Tube, and Primary Peritoneal Carcinoma, AJCC 8th Edition - Clinical stage from 10/18/2019: FIGO Stage IIIC, calculated as Stage Unknown (cT3c, cNX, cM0) - Signed by Heath Lark, MD on 10/18/2019   10/21/2019 Tumor Marker   Patient's tumor was tested for the following markers: CA-125. Results of the tumor marker test revealed 257.   10/31/2019 - 03/21/2020 Chemotherapy   The patient had carboplatin and taxol for chemotherapy treatment.     11/22/2019 Tumor Marker   Patient's tumor was tested for the following markers: CA-125. Results of the tumor marker test revealed  30.7   12/12/2019 Imaging   1. Today's study demonstrates a positive response to therapy with regression of right ovarian lesion(s), and decreased volume of presumably malignant ascites. 2. No definitive evidence of metastatic disease in the thorax. Multiple tiny 2-4 mm pulmonary nodules are stable compared to prior examinations. Continued attention on follow-up studies is recommended to exclude the possibility of metastatic lesions     12/26/2019 Tumor Marker   Patient's tumor was tested for the following markers: CA-125 Results of the tumor marker test revealed 16.4   01/10/2020 Surgery   Surgeon: Donaciano Eva    Assistants: Dr Lahoma Crocker (an MD assistant was necessary for tissue manipulation, management of robotic instrumentation, retraction and positioning due to the complexity of the case and hospital policies).  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    Post-operative Diagnosis: same   Operation: Robotic-assisted laparoscopic total hysterectomy with bilateral salpingoophorectomy, omentectomy, radical tumor debulking     Operative Findings:  : bulky right ovarian mass (approximately 6cm), mild nodularity to left ovary, tumor nodules on peritoneum of pelvis (all resected), no gross omental disease. No gross residual tumor at the completion of the procedure representing a complete (optimal) cytoreduction   01/10/2020 Pathology Results   Clinical History: Ovarian cancer ([facility])   FINAL MICROSCOPIC DIAGNOSIS:   A. UTERUS, CERVIX, BILATERAL TUBES AND OVARIES:  - Right ovary:       Microscopic foci of adenocarcinoma associated with extensive necrosis and hemosiderin deposition.       No ovarian surface involvement identified.       Paraovarian necrotic and hemorrhagic nodule.       See oncology table and comment.  - Left Fallopian tube:       Microscopic focus of adenocarcinoma involving submucosa.       See comment.  - Cervix:       Nabothian cyst.       No dysplasia or malignancy.  - Endometrium:       Inactive.       No hyperplasia or malignancy.  - Right Fallopian tube:       Adhesions with focal hemosiderin deposition.       No malignancy identified.  - Left ovary:       Endosalpingosis.       No malignancy identified.   B. PERITONEAL NODULE, BIOPSY:  - Necrotic and hemorrhagic  nodule with hemosiderin deposition.  - No viable malignancy identified.   C. OMENTUM:  - Foci of fibrosis associated with adhesions and hemosiderin deposition.  - No malignancy identified.  - One benign lymph node (0/1).   ONCOLOGY TABLE:  OVARY or FALLOPIAN TUBE or PRIMARY PERITONEUM: Resection  Procedure: Hysterectomy with bilateral salpingo-oophorectomy, peritoneal nodule biopsy and omentectomy.  Specimen Integrity: Intact.  Tumor Site: Right ovary.  Tumor Size: 5.2 x 4.5 x 3.8 cm, see comment.  Histologic Type: Endometrioid adenocarcinoma.  Histologic Grade: FIGO grade 2.  Ovarian Surface Involvement: Not identified.  Fallopian Tube Surface Involvement: Not identified.  See comment.  Other Tissue/ Organ Involvement: Not identified.  Peritoneal/Ascitic Fluid Involvement: Not applicable.  Chemotherapy Response Score (CRS): Near complete response, CRS 3.  See  comment.  Regional Lymph Nodes: No lymph nodes submitted.  Distant Metastasis:       Distant Site(s) Involved: Not identified.  Pathologic Stage Classification (pTNM, AJCC 8th Edition): ypT2a, ypNX  Ancillary Studies: Can be performed if requested.  Representative Tumor  Block: A6 and A7.    02/29/2020 Tumor Marker   Patient's tumor was tested for the following markers: CA-125 Results of the tumor marker test revealed 14.2   03/02/2020 Genetic Testing   Negative hereditary cancer genetic testing: no pathogenic variants detected in [facility] CustomNext-Cancer +RNAinsight Panel.  The report date is March 02, 2020.  HRD testing was unable to be performed due to insufficient sample availability.   The CustomNext-Cancer+RNAinsight panel offered by [facility] includes sequencing and rearrangement analysis for the following 47 genes:  APC, ATM, AXIN2, BARD1, BMPR1A, BRCA1, BRCA2, BRIP1, CDH1, CDK4, CDKN2A, CHEK2, DICER1, EPCAM, GREM1, HOXB13, MEN1, MLH1, MSH2, MSH3, MSH6, MUTYH, NBN, NF1, NF2, NTHL1, PALB2, PMS2, POLD1, POLE,  PTEN, RAD51C, RAD51D, RECQL, RET, SDHA, SDHAF2, SDHB, SDHC, SDHD, SMAD4, SMARCA4, STK11, TP53, TSC1, TSC2, and VHL.  RNA data is routinely analyzed for use in variant interpretation for all genes.   03/21/2020 Tumor Marker   Patient's tumor was tested for the following markers: CA-125 Results of the tumor marker test revealed 11.5   04/20/2020 Tumor Marker   Patient's tumor was tested for the following markers: CA-125 Results of the tumor marker test revealed 9.3   04/24/2020 Imaging   1. Status post interval hysterectomy and oophorectomy. 2. Interval resolution of previously seen small volume ascites throughout the abdomen and pelvis. There is minimal peritoneal thickening, for example in the right paracolic gutter. 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 3. Stable small pulmonary nodules of the left lung base, measuring 3 mm, most likely incidental and benign. Continued attention on follow-up.       REVIEW OF SYSTEMS:   Constitutional: Denies fevers, chills or abnormal weight loss Eyes: Denies blurriness of vision Ears, nose, mouth, throat, and face: Denies mucositis or sore throat Respiratory: Denies cough, dyspnea or wheezes Cardiovascular: Denies palpitation, chest discomfort or lower extremity swelling Gastrointestinal:  Denies nausea, heartburn or change in bowel habits Skin: Denies abnormal skin rashes Lymphatics: Denies new lymphadenopathy or easy bruising Behavioral/Psych: Mood is stable, no new changes  All other systems were reviewed with the patient and are negative.  I have reviewed the past medical history, past surgical history, social history and family history with the patient and they are unchanged from previous note.  ALLERGIES:  has No Known Allergies.  MEDICATIONS:  Current Outpatient Medications  Medication Sig Dispense Refill  . cetirizine (ZYRTEC) 10 MG tablet Chew 10 mg by mouth daily.    . Cholecalciferol  50 MCG (2000 UT) CAPS Take 2,000 Units by mouth daily.    Marland Kitchen ELIQUIS 5 MG TABS tablet TAKE 1 TABLET BY MOUTH TWO TIMES DAILY 60 tablet 11  . gabapentin (NEURONTIN) 300 MG capsule TAKE 1 CAPSULE BY MOUTH THREE TIMES DAILY (Patient taking differently: Take 300 mg by mouth 3 (three) times daily.) 270 capsule 3  . Melatonin 10 MG TABS Take 10 mg by mouth at bedtime.    . ondansetron (ZOFRAN) 8 MG tablet Take by mouth every 8 (eight) hours as needed for nausea or vomiting.    . pantoprazole (PROTONIX) 40 MG tablet TAKE 1 TABLET BY MOUTH DAILY. (Patient taking differently: Take 40 mg by mouth daily.) 90 tablet 1  . polyethylene glycol (MIRALAX / GLYCOLAX) 17 g packet Take 17 g by mouth daily.    . prochlorperazine (COMPAZINE) 10 MG tablet TAKE 1 TABLET BY MOUTH EVERY 6 HOURS AS NEEDED (NAUSEA OR VOMITING). 30 tablet 1  . vitamin B-12 (CYANOCOBALAMIN) 1000 MCG tablet  Take 1,000 mcg by mouth daily.     No current facility-administered medications for this visit.   Facility-Administered Medications Ordered in Other Visits  Medication Dose Route Frequency Provider Last Rate Last Admin  . iohexol (OMNIPAQUE) 350 MG/ML injection 100 mL  100 mL Intravenous Once PRN Alvy Bimler, Yonna Alwin, MD        PHYSICAL EXAMINATION: ECOG PERFORMANCE STATUS: 1 - Symptomatic but completely ambulatory  Vitals:   04/24/20 1106  BP: (!) 147/86  Pulse: 66  Resp: 17  Temp: 97.7 F (36.5 C)  SpO2: 100%   Filed Weights   04/24/20 1106  Weight: 163 lb 9.6 oz (74.2 kg)    GENERAL:alert, no distress and comfortable NEURO: alert & oriented x 3 with fluent speech, no focal motor/sensory deficits  LABORATORY DATA:  I have reviewed the data as listed    Component Value Date/Time   NA 142 04/20/2020 1117   NA 142 02/28/2019 1529   K 3.7 04/20/2020 1117   CL 103 04/20/2020 1117   CO2 26 04/20/2020 1117   GLUCOSE 89 04/20/2020 1117   BUN 18 04/20/2020 1117   BUN 14 02/28/2019 1529   CREATININE 0.97 04/20/2020 1117    CREATININE 0.79 05/28/2019 1156   CALCIUM 9.1 04/20/2020 1117   PROT 7.4 04/20/2020 1117   PROT 6.7 02/28/2019 1529   ALBUMIN 4.2 04/20/2020 1117   ALBUMIN 4.3 02/28/2019 1529   AST 24 04/20/2020 1117   ALT 14 04/20/2020 1117   ALKPHOS 64 04/20/2020 1117   BILITOT 0.3 04/20/2020 1117   BILITOT <0.2 02/28/2019 1529   GFRNONAA >60 04/20/2020 1117   GFRNONAA 81 05/28/2019 1156   GFRAA 94 05/28/2019 1156    No results found for: SPEP, UPEP  Lab Results  Component Value Date   WBC 3.7 (L) 04/20/2020   NEUTROABS 2.2 04/20/2020   HGB 9.9 (L) 04/20/2020   HCT 28.1 (L) 04/20/2020   MCV 98.6 04/20/2020   PLT 163 04/20/2020      Chemistry      Component Value Date/Time   NA 142 04/20/2020 1117   NA 142 02/28/2019 1529   K 3.7 04/20/2020 1117   CL 103 04/20/2020 1117   CO2 26 04/20/2020 1117   BUN 18 04/20/2020 1117   BUN 14 02/28/2019 1529   CREATININE 0.97 04/20/2020 1117   CREATININE 0.79 05/28/2019 1156      Component Value Date/Time   CALCIUM 9.1 04/20/2020 1117   ALKPHOS 64 04/20/2020 1117   AST 24 04/20/2020 1117   ALT 14 04/20/2020 1117   BILITOT 0.3 04/20/2020 1117   BILITOT <0.2 02/28/2019 1529       RADIOGRAPHIC STUDIES: I have reviewed multiple imaging studies with the patient and her husband I have personally reviewed the radiological images as listed and agreed with the findings in the report. CT ABDOMEN PELVIS W CONTRAST  Result Date: 04/24/2020 CLINICAL DATA:  Ovarian cancer, s/p hysterectomy and chemotherapy, assess treatment response EXAM: CT ABDOMEN AND PELVIS WITH CONTRAST TECHNIQUE: Multidetector CT imaging of the abdomen and pelvis was performed using the standard protocol following bolus administration of intravenous contrast. CONTRAST:  181m OMNIPAQUE IOHEXOL 300 MG/ML SOLN, additional oral enteric contrast COMPARISON:  12/12/2019 FINDINGS: Lower chest: No acute abnormality. Stable small pulmonary nodules of the left lung base, measuring 3 mm  (series 3, image 10, 11). Hepatobiliary: No solid liver abnormality is seen. Unchanged low-attenuation lesions of the liver, the largest of the liver dome clearly a simple cyst or hemangioma,  others subcentimeter and too small to characterize (series 2, image 9). No gallstones, gallbladder wall thickening, or biliary dilatation. Pancreas: Unremarkable. No pancreatic ductal dilatation or surrounding inflammatory changes. Spleen: Normal in size without significant abnormality. Adrenals/Urinary Tract: Adrenal glands are unremarkable. Kidneys are normal, without renal calculi, solid lesion, or hydronephrosis. Bladder is unremarkable. Stomach/Bowel: Stomach is within normal limits. Appendix appears normal. No evidence of bowel wall thickening, distention, or inflammatory changes. Vascular/Lymphatic: Redemonstrated infrarenal IVC filter. No enlarged abdominal or pelvic lymph nodes. Reproductive: Status post interval hysterectomy and oophorectomy. Other: No abdominal wall hernia or abnormality. Interval resolution of previously seen small volume ascites throughout the abdomen and pelvis. There is minimal peritoneal thickening, for example in the right paracolic gutter (series 2, image 41). Musculoskeletal: No acute or significant osseous findings. IMPRESSION: 1. Status post interval hysterectomy and oophorectomy. 2. Interval resolution of previously seen small volume ascites throughout the abdomen and pelvis. There is minimal peritoneal thickening, for example in the right paracolic gutter. 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 3. Stable small pulmonary nodules of the left lung base, measuring 3 mm, most likely incidental and benign. Continued attention on follow-up. Electronically Signed   By: Eddie Candle M.D.   On: 04/24/2020 09:52

## 2020-04-24 NOTE — Assessment & Plan Note (Signed)
This is multifactorial She will continue gabapentin and follow-up with [facility] neurology

## 2020-04-24 NOTE — Assessment & Plan Note (Signed)
She has mild persistent pancytopenia due to prior treatment She is not symptomatic Observe closely Plan to recheck blood count again in 3 months

## 2020-04-24 NOTE — Assessment & Plan Note (Signed)
She had incidental lung nodules noted for a long time She is not symptomatic Observe closely for now

## 2020-04-25 ENCOUNTER — Other Ambulatory Visit (HOSPITAL_COMMUNITY): Payer: Self-pay | Admitting: Physician Assistant

## 2020-04-25 ENCOUNTER — Other Ambulatory Visit: Payer: Self-pay | Admitting: Radiology

## 2020-04-26 ENCOUNTER — Ambulatory Visit (HOSPITAL_COMMUNITY)
Admission: RE | Admit: 2020-04-26 | Discharge: 2020-04-26 | Disposition: A | Discharge status: Home / Self Care | Payer: 59 | Source: Ambulatory Visit | Attending: Diagnostic Radiology | Admitting: Diagnostic Radiology

## 2020-04-26 ENCOUNTER — Other Ambulatory Visit: Payer: Self-pay

## 2020-04-26 ENCOUNTER — Encounter (HOSPITAL_COMMUNITY): Payer: Self-pay

## 2020-04-26 DIAGNOSIS — Z8543 Personal history of malignant neoplasm of ovary: Secondary | ICD-10-CM | POA: Insufficient documentation

## 2020-04-26 DIAGNOSIS — Z95828 Presence of other vascular implants and grafts: Secondary | ICD-10-CM

## 2020-04-26 DIAGNOSIS — Z452 Encounter for adjustment and management of vascular access device: Secondary | ICD-10-CM | POA: Insufficient documentation

## 2020-04-26 DIAGNOSIS — Z86718 Personal history of other venous thrombosis and embolism: Secondary | ICD-10-CM | POA: Diagnosis not present

## 2020-04-26 DIAGNOSIS — Z7901 Long term (current) use of anticoagulants: Secondary | ICD-10-CM | POA: Insufficient documentation

## 2020-04-26 DIAGNOSIS — Z79899 Other long term (current) drug therapy: Secondary | ICD-10-CM | POA: Diagnosis not present

## 2020-04-26 HISTORY — PX: IR IVC FILTER RETRIEVAL / S&I /IMG GUID/MOD SED: IMG5308

## 2020-04-26 LAB — BASIC METABOLIC PANEL
Anion gap: 6 (ref 5–15)
BUN: 15 mg/dL (ref 8–23)
CO2: 28 mmol/L (ref 22–32)
Calcium: 9.5 mg/dL (ref 8.9–10.3)
Chloride: 106 mmol/L (ref 98–111)
Creatinine, Ser: 0.93 mg/dL (ref 0.44–1.00)
GFR, Estimated: >60 mL/min (ref >60)
Glucose, Bld: 79 mg/dL (ref 70–99)
Potassium: 3.8 mmol/L (ref 3.5–5.1)
Sodium: 140 mmol/L (ref 135–145)

## 2020-04-26 LAB — CBC
HCT: 31.6 % — ABNORMAL LOW (ref 36.0–46.0)
Hemoglobin: 10.7 g/dL — ABNORMAL LOW (ref 12.0–15.0)
MCH: 34.6 pg — ABNORMAL HIGH (ref 26.0–34.0)
MCHC: 33.9 g/dL (ref 30.0–36.0)
MCV: 102.3 fL — ABNORMAL HIGH (ref 80.0–100.0)
Platelets: 234 10*3/uL (ref 150–400)
RBC: 3.09 MIL/uL — ABNORMAL LOW (ref 3.87–5.11)
RDW: 15 % (ref 11.5–15.5)
WBC: 3 10*3/uL — ABNORMAL LOW (ref 4.0–10.5)
nRBC: 0 % (ref 0.0–0.2)

## 2020-04-26 LAB — PROTIME-INR
INR: 1.2 (ref 0.8–1.2)
Prothrombin Time: 14.7 seconds (ref 11.4–15.2)

## 2020-04-26 MED ORDER — IOHEXOL 300 MG/ML  SOLN
150.0000 mL | Freq: Once | INTRAMUSCULAR | Status: AC | PRN
  Administered 2020-04-26: 25 mL via INTRAVENOUS

## 2020-04-26 MED ORDER — LIDOCAINE HCL (PF) 1 % IJ SOLN
INTRAMUSCULAR | Status: AC
  Filled 2020-04-26: qty 30

## 2020-04-26 MED ORDER — FENTANYL CITRATE (PF) 100 MCG/2ML IJ SOLN
INTRAMUSCULAR | Status: AC | PRN
  Administered 2020-04-26: 50 ug via INTRAVENOUS

## 2020-04-26 MED ORDER — LIDOCAINE HCL (PF) 1 % IJ SOLN
INTRAMUSCULAR | Status: AC | PRN
  Administered 2020-04-26: 10 mL

## 2020-04-26 MED ORDER — FENTANYL CITRATE (PF) 100 MCG/2ML IJ SOLN
INTRAMUSCULAR | Status: AC
  Filled 2020-04-26: qty 2

## 2020-04-26 MED ORDER — SODIUM CHLORIDE 0.9 % IV SOLN: INTRAVENOUS | Status: DC

## 2020-04-26 MED ORDER — MIDAZOLAM HCL 2 MG/2ML IJ SOLN
INTRAMUSCULAR | Status: AC | PRN
  Administered 2020-04-26: 1 mg via INTRAVENOUS

## 2020-04-26 MED ORDER — MIDAZOLAM HCL 2 MG/2ML IJ SOLN
INTRAMUSCULAR | Status: AC
  Filled 2020-04-26: qty 2

## 2020-04-26 NOTE — Procedures (Signed)
Interventional Radiology Procedure Note  Procedure: IVC filter retrieval  Complications: None  Estimated Blood Loss: None  Recommendations: - Bedrest x 2 hrs - DC home    Signed,  Criselda Peaches, MD

## 2020-04-26 NOTE — Discharge Instructions (Signed)
Inferior Vena Cava Filter Removal, Care After This sheet gives you information about how to care for yourself after your procedure. Your health care provider may also give you more specific instructions. If you have problems or questions, contact your health care provider. What can I expect after the procedure? After the procedure, it is common to have:  Mild pain and bruising around the area where the long, thin tube (catheter) was inserted in your neck or groin.  Tiredness (fatigue). Follow these instructions at home: Insertion site care  Follow instructions from your health care provider about how to take care of your catheter insertion site. Make sure you: ? Wash your hands with soap and water for at least 20 seconds before and after you change your bandage (dressing). If soap and water are not available, use hand sanitizer. ? Change your dressing as told by your health care provider.  Check your insertion site every day for signs of infection. Check for: ? Redness, swelling, or more pain. ? Fluid or blood. ? Warmth. ? Pus or a bad smell.   General instructions  Take over-the-counter and prescription medicines only as told by your health care provider.  Do not take baths, swim, or use a hot tub until your health care provider approves. Ask your health care provider if you may take showers.  If you were given a sedative during the procedure, it can affect you for several hours. Do not drive or operate machinery until your health care provider says that it is safe.  Return to your normal activities as told by your health care provider. Ask your health care provider what activities are safe for you.  Keep all follow-up visits as told by your health care provider. This is important. Contact a health care provider if:  You have chills or a fever.  You have redness, swelling, or more pain around your catheter insertion site.  Your insertion site feels warm to the touch.  You have  pus or a bad smell coming from your insertion site. Get help right away if:  You have blood coming from your catheter insertion site (active bleeding). ? If you have bleeding from the insertion site, lie down, apply pressure to the area with a clean cloth or gauze, and get help right away.  You have chest pain.  You have difficulty breathing. Summary  Follow instructions from your health care provider about how to take care of your catheter insertion site.  Return to your normal activities as told by your health care provider.  Check your catheter insertion site every day for signs of infection.  Get help right away if you have active bleeding, chest pain, or trouble breathing. This information is not intended to replace advice given to you by your health care provider. Make sure you discuss any questions you have with your health care provider. Document Revised: 12/29/2018 Document Reviewed: 12/29/2018 Elsevier Patient Education  2021 [employer].

## 2020-04-26 NOTE — H&P (Signed)
Chief Complaint: Patient was seen in consultation today for IVC filter management  Referring Physician(s): Dr. Alvy Bimler  Supervising Physician: Jacqulynn Cadet  Patient Status: [facility] - Out-pt  History of Present Illness: Kristina Flores is a 60 y.o. female with past medical history of ovarian cancer, DVT/PE who underwent IVC filter placement during an emergency hospitalization while on vacation in [name] presents to [facility] Radiology today for IVC filter retrieval. Kristina Flores did meet with Dr. Anselm Pancoast in consultation 02/10/20 to discuss her filter removal.  At that time she was fully anticoagulated but remained on treatment for her cancer.  A plan was made for IVC filter removal once she had completed therapy.  She reports stability after completion of therapy and per recent visit with Dr. Alvy Bimler has had complete response to therapy. She is in interested in proceeding with removal today. She has been NPO.  Denies any new concerns or complaints.   Past Medical History:  Diagnosis Date  . Allergy   . Anemia   . Cancer ([place])    ovarian  . DVT (deep venous thrombosis) ([place])   . Heart murmur    benign never an issue  . Numbness and tingling   . Osteopenia 08/2016   T score -1.6 FRAX 7.8%/0.7%  . Peripheral neuropathy, idiopathic   . Pulmonary embolism [facility])     Past Surgical History:  Procedure Laterality Date  . fibroid in 2011,2003    . IR RADIOLOGIST EVAL & MGMT  02/10/2020  . miscarriage 1989    . ovarian cyst 1992    . Robotic-assisted laparoscopic total hysterectomy with bilateral salpingoophorectomy, omentectomy, radical tumor debulking   01/10/2020    Allergies: Patient has no known allergies.  Medications: Prior to Admission medications   Medication Sig Start Date End Date Taking? Authorizing Provider  cetirizine (ZYRTEC) 10 MG tablet Chew 10 mg by mouth daily.   Yes [provider]  Cholecalciferol 50 MCG (2000 UT) CAPS Take 2,000 Units by mouth daily.   Yes  [provider]  ELIQUIS 5 MG TABS tablet TAKE 1 TABLET BY MOUTH TWO TIMES DAILY 10/27/19 10/26/20 Yes Gorsuch, Ni, MD  gabapentin (NEURONTIN) 300 MG capsule TAKE 1 CAPSULE BY MOUTH THREE TIMES DAILY Patient taking differently: Take 300 mg by mouth 3 (three) times daily. 08/23/19 08/22/20 Yes Wilczynski, Kristine Garbe, PA-C  Melatonin 10 MG TABS Take 10 mg by mouth at bedtime.   Yes [provider]  pantoprazole (PROTONIX) 40 MG tablet TAKE 1 TABLET BY MOUTH DAILY. Patient taking differently: Take 40 mg by mouth daily. 11/22/19 11/21/20 Yes Gorsuch, Ni, MD  polyethylene glycol (MIRALAX / GLYCOLAX) 17 g packet Take 17 g by mouth daily.   Yes [provider]  vitamin B-12 (CYANOCOBALAMIN) 1000 MCG tablet Take 1,000 mcg by mouth daily.   Yes [provider]  ondansetron (ZOFRAN) 8 MG tablet Take by mouth every 8 (eight) hours as needed for nausea or vomiting.    [provider]  prochlorperazine (COMPAZINE) 10 MG tablet TAKE 1 TABLET BY MOUTH EVERY 6 HOURS AS NEEDED (NAUSEA OR VOMITING). 10/20/19 10/19/20  Heath Lark, MD     Family History  Problem Relation Age of Onset  . Leukemia Mother 19  . Arthritis Father   . Colon cancer Father 50  . Prostate cancer Father 53  . Breast cancer Maternal Grandmother 66  . Leukemia Maternal Grandfather 86  . Colon cancer Paternal Uncle 66  . Colon cancer Cousin 75  paternal female cousin  . Throat cancer Cousin 33       paternal female cousin  . Prostate cancer Cousin        paternal cousin; dx early 68s; metastatic  . Esophageal cancer Neg Hx   . Rectal cancer Neg Hx   . Stomach cancer Neg Hx     Social History   Socioeconomic History  . Marital status: Married    Spouse name: Kristina Flores  . Number of children: 3  . Years of education: College  . Highest education level: Not on file  Occupational History  . Occupation: PRESCHOOL Product manager: [employer]  Tobacco Use  . Smoking status: Never  Smoker  . Smokeless tobacco: Never Used  Vaping Use  . Vaping Use: Never used  Substance and Sexual Activity  . Alcohol use: Not Currently    Comment: occasional  . Drug use: No  . Sexual activity: Yes    Partners: Male    Birth control/protection: Post-menopausal  Other Topics Concern  . Not on file  Social History Narrative   Patient is married Kristina Flores) and lives at home with her husband. Married x 36 years.   Patient has three adult children; 2 in [place] (30, 76); one in [place] (25).  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   Patient has a college education.   Patient is right-handed.   Patient drinks one cup of coffee daily.   Pillates three times per week for sixteen years.     Social Determinants of Health   Financial Resource Strain: Not on file  Food Insecurity: Not on file  Transportation Needs: Not on file  Physical Activity: Not on file  Stress: Not on file  Social Connections: Not on file     Review of Systems: A 12 point ROS discussed and pertinent positives are indicated in the HPI above.  All other systems are negative.  Review of Systems  Constitutional: Negative for fatigue and fever.  Respiratory: Negative for cough and shortness of breath.   Cardiovascular: Negative for chest pain.  Gastrointestinal: Negative for abdominal pain, nausea and vomiting.  Genitourinary: Negative for dysuria, hematuria and vaginal bleeding.  Musculoskeletal: Negative for back pain.  Psychiatric/Behavioral: Negative for behavioral problems and confusion.    Vital Signs: BP 140/78   Pulse (!) 53   Temp 98.3 F (36.8 C) (Oral)   Resp 17   Ht 5\' 4"  (1.626 m)   Wt 159 lb (72.1 kg)   SpO2 98%   BMI 27.29 kg/m   Physical Exam Vitals and nursing note reviewed.  Constitutional:      Appearance: Normal appearance.  HENT:     Mouth/Throat:     Mouth: Mucous membranes are moist.     Pharynx:  Oropharynx is clear.  Cardiovascular:     Rate and Rhythm: Normal rate and regular rhythm.  Pulmonary:     Effort: Pulmonary effort is normal.     Breath sounds: Normal breath sounds.  Abdominal:     General: Abdomen is flat.     Palpations: Abdomen is soft.  Skin:    General: Skin is warm and dry.  Neurological:     General: No focal deficit present.     Mental Status: She is alert and oriented to person, place, and time. Mental status is at baseline.  Psychiatric:        Mood and Affect: Mood normal.  Behavior: Behavior normal.        Thought Content: Thought content normal.        Judgment: Judgment normal.      MD Evaluation Airway: WNL Heart: WNL Abdomen: WNL Chest/ Lungs: WNL ASA  Classification: 3 Mallampati/Airway Score: Two   Imaging: CT ABDOMEN PELVIS W CONTRAST  Result Date: 04/24/2020 CLINICAL DATA:  Ovarian cancer, s/p hysterectomy and chemotherapy, assess treatment response EXAM: CT ABDOMEN AND PELVIS WITH CONTRAST TECHNIQUE: Multidetector CT imaging of the abdomen and pelvis was performed using the standard protocol following bolus administration of intravenous contrast. CONTRAST:  142mL OMNIPAQUE IOHEXOL 300 MG/ML SOLN, additional oral enteric contrast COMPARISON:  12/12/2019 FINDINGS: Lower chest: No acute abnormality. Stable small pulmonary nodules of the left lung base, measuring 3 mm (series 3, image 10, 11). Hepatobiliary: No solid liver abnormality is seen. Unchanged low-attenuation lesions of the liver, the largest of the liver dome clearly a simple cyst or hemangioma, others subcentimeter and too small to characterize (series 2, image 9). No gallstones, gallbladder wall thickening, or biliary dilatation. Pancreas: Unremarkable. No pancreatic ductal dilatation or surrounding inflammatory changes. Spleen: Normal in size without significant abnormality. Adrenals/Urinary Tract: Adrenal glands are unremarkable. Kidneys are normal, without renal calculi,  solid lesion, or hydronephrosis. Bladder is unremarkable. Stomach/Bowel: Stomach is within normal limits. Appendix appears normal. No evidence of bowel wall thickening, distention, or inflammatory changes. Vascular/Lymphatic: Redemonstrated infrarenal IVC filter. No enlarged abdominal or pelvic lymph nodes. Reproductive: Status post interval hysterectomy and oophorectomy. Other: No abdominal wall hernia or abnormality. Interval resolution of previously seen small volume ascites throughout the abdomen and pelvis. There is minimal peritoneal thickening, for example in the right paracolic gutter (series 2, image 41). Musculoskeletal: No acute or significant osseous findings. IMPRESSION: 1. Status post interval hysterectomy and oophorectomy. 2. Interval resolution of previously seen small volume ascites throughout the abdomen and pelvis. There is minimal peritoneal thickening, for example in the right paracolic gutter. 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 3. Stable small pulmonary nodules of the left lung base, measuring 3 mm, most likely incidental and benign. Continued attention on follow-up. Electronically Signed   By: Eddie Candle M.D.   On: 04/24/2020 09:52    Labs:  CBC: Recent Labs    02/29/20 0950 03/21/20 1111 04/20/20 1117 04/26/20 0730  WBC 4.4 5.5 3.7* 3.0*  HGB 10.6* 10.1* 9.9* 10.7*  HCT 30.5* 28.8* 28.1* 31.6*  PLT 134* 150 163 234    COAGS: Recent Labs    04/26/20 0745  INR 1.2    BMP: Recent Labs    05/28/19 1156 10/20/19 1300 02/29/20 0950 03/21/20 1111 04/20/20 1117 04/26/20 0745  NA 137   < > 139 140 142 140  K 3.8   < > 3.7 4.0 3.7 3.8  CL 102   < > 106 103 103 106  CO2 29   < > 23 25 26 28   GLUCOSE 138*   < > 125* 111* 89 79  BUN 12   < > 22 21 18 15   CALCIUM 8.7   < > 9.4 9.7 9.1 9.5  CREATININE 0.79   < > 0.98 0.93 0.97 0.93  GFRNONAA 81   < > >60 >60 >60 >60  GFRAA 94  --   --   --   --   --    < >  = values in this interval not displayed.    LIVER FUNCTION TESTS: Recent Labs    02/07/20  1001 02/29/20 0950 03/21/20 1111 04/20/20 1117  BILITOT 0.3 0.3 0.4 0.3  AST 16 20 16 24   ALT 12 17 15 14   ALKPHOS 80 94 69 64  PROT 7.5 8.1 8.1 7.4  ALBUMIN 3.9 4.3 4.2 4.2    TUMOR MARKERS: No results for input(s): AFPTM, CEA, CA199, CHROMGRNA in the last 8760 hours.  Assessment and Plan: Patient with past medical history of ovarian cancer s/p treatment, DVT/PE presents after completion of therapy for IVC filter removal.  IVC filter was originally placed in [name] in 2021.  She is now fully anticoagulated with tolerance.  IR consulted for IVC filter removal at the request of Dr. Alvy Bimler. Case reviewed by Dr. Anselm Pancoast who approves patient for procedure today.  Patient presents today in their usual state of health.  She has been NPO and is currently on blood thinners.   Risks and benefits discussed with the patient including, but not limited to bleeding, infection, contrast induced renal failure, filter fracture or migration which can lead to emergency surgery or even death, strut penetration with damage or irritation to adjacent structures and caval thrombosis.  All of the patient's questions were answered, patient is agreeable to proceed. Consent signed and in chart.  Thank you for this interesting consult.  I greatly enjoyed meeting Thana Farr Shippey and look forward to participating in their care.  A copy of this report was sent to the requesting provider on this date.  Electronically Signed: Docia Barrier, PA 04/26/2020, 9:38 AM   I spent a total of    25 Minutes in face to face in clinical consultation, greater than 50% of which was counseling/coordinating care for DVT/PE, IVC filter management

## 2020-04-30 ENCOUNTER — Other Ambulatory Visit: Payer: Self-pay | Admitting: Gynecologic Oncology

## 2020-04-30 DIAGNOSIS — Z1231 Encounter for screening mammogram for malignant neoplasm of breast: Secondary | ICD-10-CM

## 2020-05-07 ENCOUNTER — Telehealth: Payer: Self-pay | Admitting: *Deleted

## 2020-05-09 ENCOUNTER — Emergency Department
Admission: EM | Admit: 2020-05-09 | Discharge: 2020-05-09 | Disposition: A | Discharge status: Home / Self Care | Payer: 59 | Source: Home / Self Care

## 2020-05-09 ENCOUNTER — Other Ambulatory Visit: Payer: Self-pay

## 2020-05-09 ENCOUNTER — Emergency Department (INDEPENDENT_AMBULATORY_CARE_PROVIDER_SITE_OTHER): Payer: 59

## 2020-05-09 DIAGNOSIS — M79672 Pain in left foot: Secondary | ICD-10-CM

## 2020-05-09 DIAGNOSIS — S92355A Nondisplaced fracture of fifth metatarsal bone, left foot, initial encounter for closed fracture: Secondary | ICD-10-CM | POA: Diagnosis not present

## 2020-05-09 DIAGNOSIS — R030 Elevated blood-pressure reading, without diagnosis of hypertension: Secondary | ICD-10-CM

## 2020-05-09 DIAGNOSIS — M7989 Other specified soft tissue disorders: Secondary | ICD-10-CM | POA: Diagnosis not present

## 2020-05-09 DIAGNOSIS — M25475 Effusion, left foot: Secondary | ICD-10-CM | POA: Diagnosis not present

## 2020-05-09 NOTE — ED Triage Notes (Signed)
Patient presents to Urgent Care with complaints of left foot pain, laterally since earlier today. Patient reports she rolled her foot on a gumball from a sweet gum tree. Pt states she did not fall to the ground. Is ambulatory upon arrival, swelling and ecchymosis noted upon assessment.

## 2020-05-09 NOTE — Discharge Instructions (Addendum)
Patient sustained left foot fifth metatarsal fracture and will call her orthopedic provider Lilia Argue, DO) tomorrow (05/10/20) for appointment/evaluation this week or early next.  Patient has her own left foot Cam Boot/Aircast with her today at this office visit from previous fourth and fifth phalanx fracture from July 2021).

## 2020-05-09 NOTE — ED Provider Notes (Signed)
[facility]    CSN: [id number] Arrival date & time: 05/09/20  1544      History   Chief Complaint Chief Complaint  Patient presents with  . Foot Pain    Left    HPI Kristina Flores is a 60 y.o. female.   HPI pleasant 60 year old female presents with left foot pain for 2-3 hours.  Patient reports she tripped over gumball under gum tree in her yard this morning around 11 AM and rolled her left foot.  Reports that she immediately felt pain on the lateral aspect of her left foot.  Patient had closed fracture of fourth and fifth proximal phalanx of left foot in July 2021 and has her left foot cam boot/air cast from previous fractures with her today.  Reports left foot pain is exacerbated with weightbearing/walking.  Blood pressure is elevated today at this office visit.  Denies headache, changes in visual acuity, lightheadedness, or dizziness.  Past Medical History:  Diagnosis Date  . Allergy   . Anemia   . Cancer ([place])    ovarian  . DVT (deep venous thrombosis) ([place])   . Heart murmur    benign never an issue  . Numbness and tingling   . Osteopenia 08/2016   T score -1.6 FRAX 7.8%/0.7%  . Peripheral neuropathy, idiopathic   . Pulmonary embolism [facility])     Patient Active Problem List   Diagnosis Date Noted  . Genetic testing 03/09/2020  . Family history of breast cancer 02/03/2020  . Family history of colon cancer 02/03/2020  . Family history of leukemia 02/03/2020  . Physical deconditioning 10/27/2019  . Cancer associated pain 10/20/2019  . Multiple lung nodules on CT 10/18/2019  . Acute pulmonary embolism ([place]) 10/18/2019  . Acute deep vein thrombosis of lower leg, bilateral ([place]) 10/18/2019  . S/P IVC filter 10/18/2019  . Pancytopenia, acquired ([place]) 10/18/2019  . Ovarian cancer ([place]) 10/14/2019  . Plantar fasciitis of left foot 02/17/2017  . 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  Nonallopathic lesion of lumbosacral region 03/02/2012  . Nonallopathic lesion of cervical region 03/02/2012  . DDD (degenerative disc disease), lumbar 09/20/2011  . Menopause 09/20/2011  . Allergic rhinitis 09/20/2011    Past Surgical History:  Procedure Laterality Date  . fibroid in 2011,2003    . IR IVC FILTER RETRIEVAL / S&I /IMG GUID/MOD SED  04/26/2020  . IR RADIOLOGIST EVAL & MGMT  02/10/2020  . miscarriage 1989    . ovarian cyst 1992    . Robotic-assisted laparoscopic total hysterectomy with bilateral salpingoophorectomy, omentectomy, radical tumor debulking   01/10/2020    OB History    Gravida  4   Para  3   Term      Preterm      AB  1   Living  3     SAB      IAB      Ectopic      Multiple      Live Births               Home Medications    Prior to Admission medications   Medication Sig Start Date End Date Taking? Authorizing Provider  cetirizine (ZYRTEC) 10 MG tablet Chew 10 mg by mouth daily.    [provider]  Cholecalciferol 50 MCG (2000 UT) CAPS Take 2,000 Units by mouth daily.    [provider]  ELIQUIS 5 MG TABS tablet TAKE  1 TABLET BY MOUTH TWO TIMES DAILY 10/27/19 10/26/20  Heath Lark, MD  gabapentin (NEURONTIN) 300 MG capsule TAKE 1 CAPSULE BY MOUTH THREE TIMES DAILY Patient taking differently: Take 300 mg by mouth 3 (three) times daily. 08/23/19 08/22/20  Wilczynski, Kristine Garbe, PA-C  Melatonin 10 MG TABS Take 10 mg by mouth at bedtime.    [provider]  ondansetron (ZOFRAN) 8 MG tablet Take by mouth every 8 (eight) hours as needed for nausea or vomiting.    [provider]  pantoprazole (PROTONIX) 40 MG tablet TAKE 1 TABLET BY MOUTH DAILY. Patient taking differently: Take 40 mg by mouth daily. 11/22/19 11/21/20  Heath Lark, MD  polyethylene glycol (MIRALAX / GLYCOLAX) 17 g packet Take 17 g by mouth daily.    [provider]  prochlorperazine (COMPAZINE) 10 MG tablet TAKE 1 TABLET BY MOUTH  EVERY 6 HOURS AS NEEDED (NAUSEA OR VOMITING). 10/20/19 10/19/20  Heath Lark, MD  vitamin B-12 (CYANOCOBALAMIN) 1000 MCG tablet Take 1,000 mcg by mouth daily.    [provider]    Family History Family History  Problem Relation Age of Onset  . Leukemia Mother 37  . Arthritis Father   . Colon cancer Father 79  . Prostate cancer Father 25  . Breast cancer Maternal Grandmother 66  . Leukemia Maternal Grandfather 61  . Colon cancer Paternal Uncle 25  . Colon cancer Cousin 41       paternal female cousin  . Throat cancer Cousin 44       paternal female cousin  . Prostate cancer Cousin        paternal cousin; dx early 48s; metastatic  . Esophageal cancer Neg Hx   . Rectal cancer Neg Hx   . Stomach cancer Neg Hx     Social History Social History   Tobacco Use  . Smoking status: Never Smoker  . Smokeless tobacco: Never Used  Vaping Use  . Vaping Use: Never used  Substance Use Topics  . Alcohol use: Not Currently    Comment: occasional  . Drug use: No     Allergies   Patient has no known allergies.   Review of Systems Review of Systems  Please see HPI Physical Exam Triage Vital Signs ED Triage Vitals  Enc Vitals Group     BP 05/09/20 1603 (!) 150/103     Pulse Rate 05/09/20 1603 67     Resp 05/09/20 1603 16     Temp 05/09/20 1603 98.1 F (36.7 C)     Temp Source 05/09/20 1603 Oral     SpO2 05/09/20 1603 97 %     Weight --      Height --      Head Circumference --      Peak Flow --      Pain Score 05/09/20 1601 5     Pain Loc --      Pain Edu? --      Excl. in [place]? --    No data found.  Updated Vital Signs BP (!) 199/102 Comment: patient states anxiety and pain, discussed home BP monitoring  Pulse 67   Temp 98.1 F (36.7 C) (Oral)   Resp 16   SpO2 97%     Physical Exam Constitutional:      Appearance: Normal appearance. She is normal weight.  HENT:     Head: Normocephalic and atraumatic.  Eyes:     Extraocular Movements: Extraocular  movements intact.     Conjunctiva/sclera:  Conjunctivae normal.     Pupils: Pupils are equal, round, and reactive to light.  Neck:     Vascular: No carotid bruit.  Cardiovascular:     Rate and Rhythm: Normal rate and regular rhythm.     Pulses: Normal pulses.     Heart sounds: Normal heart sounds.  Pulmonary:     Effort: Pulmonary effort is normal.     Breath sounds: Normal breath sounds.  Musculoskeletal:        General: Swelling and tenderness present.     Cervical back: Normal range of motion and neck supple.     Left foot: Decreased range of motion.       Feet:  Feet:     Comments: TTP over lateral aspect of fifth metatarsal with moderate soft tissue swelling noted, left ankle with full range of motion non-TTP inversion/eversion without pain. Skin:    Capillary Refill: Capillary refill takes less than 2 seconds.  Neurological:     General: No focal deficit present.     Mental Status: She is alert and oriented to person, place, and time.  Psychiatric:        Mood and Affect: Mood normal.        Behavior: Behavior normal.      UC Treatments / Results  Labs (all labs ordered are listed, but only abnormal results are displayed) Labs Reviewed - No data to display  EKG   Radiology DG Foot Complete Left  Result Date: 05/09/2020 CLINICAL DATA:  Lateral foot pain and swelling after stepping on a gum ball. EXAM: LEFT FOOT - COMPLETE 3+ VIEW COMPARISON:  08/13/2019 FINDINGS: The fracture of the proximal phalanx of the fourth toe on the prior study has healed in the interim. There is an acute, nondisplaced, intra-articular fracture of the base of the fifth metatarsal with overlying soft tissue swelling. There is no dislocation. Mild marginal spurring at the first MTP joint is unchanged. Moderate dorsal talonavicular spurring and a plantar calcaneal enthesophyte are also unchanged. IMPRESSION: Nondisplaced fracture of the base of the fifth metatarsal. Electronically Signed   By:  Logan Bores M.D.   On: 05/09/2020 16:32    Procedures Procedures (including critical care time)  Medications Ordered in UC Medications - No data to display  Initial Impression / Assessment and Plan / UC Course  I have reviewed the triage vital signs and the nursing notes.  Pertinent labs & imaging results that were available during my care of the patient were reviewed by me and considered in my medical decision making (see chart for details).     Closed left fifth metatarsal fracture, patient has left foot Cam boot/Aircast with her from previous fractures and was discharged in this boot with recommendations to follow-up with her orthopedic provider this week or early next.  Additionally, advised patient to monitor blood pressure twice daily for the next 7 days (before breakfast and before dinner), advised log of these measurements for PCP to evaluate daily blood pressure trends.  Advised patient may use Tylenol 1000 mg 1-2 times daily for left foot pain, encourage patient to RICE left foot for 20 minutes 3 times daily until evaluated by orthopedic provider. Final Clinical Impressions(s) / UC Diagnoses   Final diagnoses:  Closed nondisplaced fracture of fifth metatarsal bone of left foot, initial encounter  Foot pain, left     Discharge Instructions     Patient sustained left foot fifth metatarsal fracture and will call her orthopedic provider Lilia Argue, DO) tomorrow (  05/10/20) for appointment/evaluation this week or early next.  Patient has her own left foot Cam Boot/Aircast with her today at this office visit from previous fourth and fifth phalanx fracture from July 2021).   ED Prescriptions    None     PDMP not reviewed this encounter.   Eliezer Lofts, [place] 05/09/20 1737

## 2020-05-15 ENCOUNTER — Other Ambulatory Visit: Payer: Self-pay

## 2020-05-15 ENCOUNTER — Encounter: Payer: Self-pay | Admitting: Sports Medicine

## 2020-05-15 ENCOUNTER — Other Ambulatory Visit (HOSPITAL_COMMUNITY): Payer: Self-pay

## 2020-05-15 ENCOUNTER — Telehealth: Payer: Self-pay | Admitting: Oncology

## 2020-05-15 ENCOUNTER — Ambulatory Visit (INDEPENDENT_AMBULATORY_CARE_PROVIDER_SITE_OTHER): Payer: 59 | Admitting: Sports Medicine

## 2020-05-15 VITALS — BP 158/100 | Ht 64.0 in | Wt 158.0 lb

## 2020-05-15 DIAGNOSIS — S92355A Nondisplaced fracture of fifth metatarsal bone, left foot, initial encounter for closed fracture: Secondary | ICD-10-CM | POA: Diagnosis not present

## 2020-05-15 MED ORDER — CYCLOBENZAPRINE HCL 10 MG PO TABS
10.0000 mg | ORAL_TABLET | Freq: Every evening | ORAL | 0 refills | Status: DC | PRN
  Filled 2020-05-15: qty 30, 30d supply, fill #0

## 2020-05-15 NOTE — Patient Instructions (Signed)
Follow-up in 3 weeks

## 2020-05-15 NOTE — Progress Notes (Signed)
   Subjective:    Patient ID: Kristina Flores, female    DOB: 06/28/1960, 60 y.o.   MRN: [id number]  HPI chief complaint: Left foot pain  Kristina Flores presents today after having suffered a fracture at the base of her left fifth metatarsal.  A little less than a week ago, she was walking and stepped on a sweet gumball.  She experienced immediate pain and was seen at a local urgent care.  X-rays were obtained which revealed the fracture.  She had an old cam walker left over from a previous injury which she has been wearing ever since.  All of her pain is localized to the base of the fifth metatarsal.  She has been fully weightbearing in her cam walker with minimal pain.  She has suffered 2 previous fractures to the same foot.  Past medical history reviewed.  Interim medical history is significant for ovarian cancer which is currently in remission. Medications reviewed Allergies reviewed    Review of Systems    As above Objective:   Physical Exam  Well-developed, well-nourished.  No acute distress.  Left foot: Minimal swelling diffusely along the lateral foot.  There is mild ecchymosis diffusely as well.  Minimal tenderness to palpation at the base of the fifth metatarsal.  No other bony or soft tissue tenderness to direct palpation.  Good pulses.  X-rays of the left foot including AP, lateral, and oblique views show a nondisplaced intra-articular fracture at the base of the fifth metatarsal.  There are two components, one being an avulsion type fracture and the other being a fracture line suggesting a small Jones fracture.  No other fractures are seen      Assessment & Plan:   Comminuted intra-articular fracture, left fifth metatarsal (base)  Fracture is stable enough that we can continue with CAM Walker immobilization.  Her current cam walker is broken so we will provide her with a new one.  I provided her with crutches and explained to her the importance of trying to remain  nonweightbearing given the presence of the Jones fracture line.  She will follow-up with me again in 3 weeks.  We will repeat her x-rays prior to that visit.  We gave her a 5/16 inch heel lift to put in her right tennis shoe to help level her out when wearing her cam walker.  I explained to her that it is important that we keep an eye on this fracture and that there is a chance of malunion or nonunion which could lead to surgery.  She understands.  Of note, she was also experiencing some low back pain a few days ago.  Heat has been helping and I provided her with a refill on her Flexeril to take as needed.

## 2020-05-15 NOTE — Telephone Encounter (Signed)
Called [name] back and advised her of message from Dr. Alvy Bimler.  She verbalized agreement and will continue to monitor her BP.

## 2020-05-15 NOTE — Telephone Encounter (Addendum)
Kristina Flores called and said she broke her left foot on Wednesday after tripping on a gumball in her yard.  She is calling because urgent care was concerned about her blood pressure - it was 199/102 which she said was from pain and white coat syndrome.  She has been monitoring it at home and had the following readings:  05/10/20 116/87 (evening) 05/11/20 119/85 (morning) 161/102 (evening) 05/12/20 125/88  05/14/20 149/97  She is not currently taking anything for blood pressure.  She is not sure if she should contact [name] or her PCP - [name].

## 2020-05-15 NOTE — Telephone Encounter (Signed)
I am sorry to hear about her injury Her BP is OK; monitor closely, no need to take BP meds for now

## 2020-05-16 NOTE — Telephone Encounter (Addendum)
Kristina Flores left a message asking for some clarification as to why Dr. Elson Areas is not concerned about her blood pressure. She is wondering if this is common in patients with cancer or after treatment.  Reviewed with Dr. Alvy Bimler and advised Kristina Flores that the majority of her readings were in normal limits so that is why Dr. Alvy Bimler is not concerned. She does not want to rush and put her on BP medication if it is not needed. She would like Kristina Flores to continue monitoring her bp. Kristina Flores verbalized understanding and agreement.

## 2020-05-17 ENCOUNTER — Other Ambulatory Visit (HOSPITAL_COMMUNITY): Payer: Self-pay

## 2020-05-17 MED FILL — Apixaban Tab 5 MG: ORAL | 30 days supply | Qty: 60 | Fill #1 | Status: AC

## 2020-05-22 ENCOUNTER — Other Ambulatory Visit (HOSPITAL_COMMUNITY): Payer: Self-pay

## 2020-05-22 DIAGNOSIS — G609 Hereditary and idiopathic neuropathy, unspecified: Secondary | ICD-10-CM | POA: Diagnosis not present

## 2020-05-22 DIAGNOSIS — R202 Paresthesia of skin: Secondary | ICD-10-CM | POA: Diagnosis not present

## 2020-05-22 DIAGNOSIS — L299 Pruritus, unspecified: Secondary | ICD-10-CM | POA: Diagnosis not present

## 2020-05-22 MED ORDER — GABAPENTIN 300 MG PO CAPS
1.0000 | ORAL_CAPSULE | Freq: Three times a day (TID) | ORAL | 3 refills | Status: DC
  Filled 2020-05-22: qty 270, 90d supply, fill #0

## 2020-05-22 MED ORDER — ALPHA-LIPOIC ACID 300 MG PO CAPS: ORAL_CAPSULE | ORAL | 11 refills | Status: AC

## 2020-05-31 ENCOUNTER — Telehealth: Payer: Self-pay

## 2020-05-31 DIAGNOSIS — H52222 Regular astigmatism, left eye: Secondary | ICD-10-CM | POA: Diagnosis not present

## 2020-05-31 DIAGNOSIS — H5203 Hypermetropia, bilateral: Secondary | ICD-10-CM | POA: Diagnosis not present

## 2020-05-31 NOTE — Telephone Encounter (Signed)
Called per Dr. Alvy Bimler okay to take Alpha Lipoic Acid supplement, 400 mg to 600 mg daily. She verbalized understanding.

## 2020-06-04 ENCOUNTER — Other Ambulatory Visit: Payer: Self-pay

## 2020-06-04 ENCOUNTER — Ambulatory Visit
Admission: RE | Admit: 2020-06-04 | Discharge: 2020-06-04 | Disposition: A | Discharge status: Home / Self Care | Payer: 59 | Source: Ambulatory Visit | Attending: Sports Medicine | Admitting: Sports Medicine

## 2020-06-04 DIAGNOSIS — S92355A Nondisplaced fracture of fifth metatarsal bone, left foot, initial encounter for closed fracture: Secondary | ICD-10-CM

## 2020-06-04 DIAGNOSIS — S92352A Displaced fracture of fifth metatarsal bone, left foot, initial encounter for closed fracture: Secondary | ICD-10-CM | POA: Diagnosis not present

## 2020-06-05 ENCOUNTER — Ambulatory Visit: Payer: 59 | Admitting: Sports Medicine

## 2020-06-05 VITALS — BP 130/82 | Ht 64.0 in | Wt 154.0 lb

## 2020-06-05 DIAGNOSIS — S92355A Nondisplaced fracture of fifth metatarsal bone, left foot, initial encounter for closed fracture: Secondary | ICD-10-CM

## 2020-06-05 DIAGNOSIS — S92355D Nondisplaced fracture of fifth metatarsal bone, left foot, subsequent encounter for fracture with routine healing: Secondary | ICD-10-CM | POA: Diagnosis not present

## 2020-06-05 NOTE — Assessment & Plan Note (Signed)
Patient has been very compliant nonweightbearing and has remained in the boot.  We will continue to have her in the boot for the next 3 weeks and begin partial weightbearing as pain tolerates.  We did discuss if this causes a great deal of pain she will have to go back to nonweightbearing.  If it does not cause pain she can progress to full weightbearing in the boot as pain tolerates.  When she follows up in 3 weeks she will repeat x-rays and we will reassess.

## 2020-06-05 NOTE — Patient Instructions (Signed)
It was great to meet you today! Thank you for letting me participate in your care!  Today, we discussed your left 5th metatarsal fracture that is getting better. Please remain in the boot but you can begin to put partial weight on that foot as long as it is not painful.  For your wrists we are giving you night splints to wear which will help. It should improve as soon as you can discontinue using crutches.  We will see you back in 3 weeks.  Be well, Harolyn Rutherford, DO PGY-4, Sports Medicine Fellow [place]

## 2020-06-05 NOTE — Progress Notes (Signed)
    SUBJECTIVE:   CHIEF COMPLAINT / HPI:   F/u for Fracture of base of the left 6th metarsal Kristina Flores is a very pleasant 60y/o female presenting today for follow up after having a fracture at the base of her left 5th metatarsal. She has been non-weight bearing since last seeing [place] and staying in the boot. She states she feel good and has no pain.   She has had some symptoms of numbness, tingling in her hands distributed over the palmar surface over the thumb and first two fingers that wakes her up at night. She shakes her hands and that helps.  PERTINENT  PMH / PSH: Hx of Ovarian cancer, Hx of PE and DVT, DDD  OBJECTIVE:   BP 130/82   Ht 5\' 4"  (1.626 m)   Wt 154 lb (69.9 kg)   BMI 26.43 kg/m    No flowsheet data found.  Left foot Examination of the left foot is significant for no tenderness to palpation at the base of the fifth metatarsal.  She has no ecchymosis, no warmth, no edema over this area.  No obvious deformity.  2+ palpable dorsalis pedis pulse growth sensation and strength intact.  ASSESSMENT/PLAN:   Nondisplaced fracture of fifth metatarsal bone, left foot, initial encounter for closed fracture Patient has been very compliant nonweightbearing and has remained in the boot.  We will continue to have her in the boot for the next 3 weeks and begin partial weightbearing as pain tolerates.  We did discuss if this causes a great deal of pain she will have to go back to nonweightbearing.  If it does not cause pain she can progress to full weightbearing in the boot as pain tolerates.  When she follows up in 3 weeks she will repeat x-rays and we will reassess.     Kristina Alpha, DO PGY-4, Sports Medicine Fellow [place]  Patient seen and evaluated with the sports medicine fellow.  I agree with the above plan of care.  X-rays show no change in fracture position.  Clinically she is doing very well.  She has no tenderness to palpation.  She is able to fully  weight-bear without pain.  I think she is okay to start weightbearing as tolerated in her cam walker and follow-up again in 3 weeks.  We will repeat x-rays at that time.  She will call with questions or concerns in the interim.

## 2020-06-18 ENCOUNTER — Other Ambulatory Visit (HOSPITAL_COMMUNITY): Payer: Self-pay

## 2020-06-18 MED FILL — Apixaban Tab 5 MG: ORAL | 30 days supply | Qty: 60 | Fill #2 | Status: AC

## 2020-06-20 ENCOUNTER — Other Ambulatory Visit: Payer: Self-pay

## 2020-06-20 ENCOUNTER — Ambulatory Visit
Admission: RE | Admit: 2020-06-20 | Discharge: 2020-06-20 | Disposition: A | Discharge status: Home / Self Care | Payer: 59 | Source: Ambulatory Visit | Attending: Gynecologic Oncology | Admitting: Gynecologic Oncology

## 2020-06-20 DIAGNOSIS — Z1231 Encounter for screening mammogram for malignant neoplasm of breast: Secondary | ICD-10-CM

## 2020-06-25 ENCOUNTER — Other Ambulatory Visit: Payer: Self-pay

## 2020-06-25 ENCOUNTER — Ambulatory Visit
Admission: RE | Admit: 2020-06-25 | Discharge: 2020-06-25 | Disposition: A | Discharge status: Home / Self Care | Payer: 59 | Source: Ambulatory Visit | Attending: Sports Medicine | Admitting: Sports Medicine

## 2020-06-25 DIAGNOSIS — S92355A Nondisplaced fracture of fifth metatarsal bone, left foot, initial encounter for closed fracture: Secondary | ICD-10-CM | POA: Diagnosis not present

## 2020-06-25 DIAGNOSIS — S92355D Nondisplaced fracture of fifth metatarsal bone, left foot, subsequent encounter for fracture with routine healing: Secondary | ICD-10-CM

## 2020-06-26 ENCOUNTER — Other Ambulatory Visit (HOSPITAL_COMMUNITY): Payer: Self-pay

## 2020-06-26 ENCOUNTER — Ambulatory Visit: Payer: 59 | Admitting: Sports Medicine

## 2020-06-26 VITALS — BP 134/88 | Ht 64.0 in | Wt 152.0 lb

## 2020-06-26 DIAGNOSIS — G5603 Carpal tunnel syndrome, bilateral upper limbs: Secondary | ICD-10-CM

## 2020-06-26 DIAGNOSIS — S92355D Nondisplaced fracture of fifth metatarsal bone, left foot, subsequent encounter for fracture with routine healing: Secondary | ICD-10-CM

## 2020-06-26 MED ORDER — PREDNISONE 10 MG (21) PO TBPK
ORAL_TABLET | ORAL | 0 refills | Status: DC
  Filled 2020-06-26: qty 21, 6d supply, fill #0

## 2020-06-26 NOTE — Assessment & Plan Note (Addendum)
Repeat x-rays now 6 weeks out from her initial visit show no worsening but no bony callus formation does not appear to have healed.  Given that she is still having some symptoms as well we will send her to Dr. Lucia Gaskins to get his opinion on best form of management for her at this point.  We will see her back in 4 weeks after she has seen Dr. Lucia Gaskins.

## 2020-06-26 NOTE — Assessment & Plan Note (Signed)
Symptoms and history consistent with 4 weeks of bilateral carpal tunnel syndrome.  Could also be related to chemotherapy she had for ovarian cancer.  Either way we will proceed with conservative management starting her on a prednisone Dosepak today and we will start occupational/hand therapy and follow-up with her in 4 weeks.

## 2020-06-26 NOTE — Patient Instructions (Addendum)
It was great to see you today! Thank you for letting me participate in your care!  Today, we discussed your fracture and it is not showing signs of healing on x-ray. We are sending you to see Dr. Radene Journey for a consult to get his opinion on your fracture and the best management. For your carpal tunnel we are giving you a prednisone dose pack to take in the mornings with food. We are also starting you out in therapy and will see you back in 4 weeks to see how you are progressing.  [place], [place]  Appt: 06/27/20 @ 8:45 am  Be well, Kristina Rutherford, DO PGY-4, Sports Medicine Fellow [place]

## 2020-06-26 NOTE — Progress Notes (Signed)
    SUBJECTIVE:   CHIEF COMPLAINT / HPI:   F/u for left base of 5th metatarsal Fx Patient has been wearing the boot and took it easy over the next week to 10 days and still "babied it".  Since then she has continued to have improvement in her symptoms she still has mild pain and swelling from time to time it is associated with activity.  She has never walked without the boot.  She has been able to do walking and gardening tolerating that activity well she has noticed that it does swell up more at nighttime and she continues to elevate and use ice and that does relieve her symptoms.  Overall, she is feeling like she is doing better although she is still having pain if she really increases her activity.  Bilateral Carpal Tunnel Ongoing for the past 4 weeks given her difficulty mostly at nighttime.  She has numbness and tingling that has woken her up from sleep a couple times over the past 4 weeks.  She wakes up and shakes her hands to get rid of the symptoms.  Numbness and tingling is over the thumb and index and middle fingers on both hands.  She did not feel like loss of strength is occurred and is not dropping things.  PERTINENT  PMH / PSH: History of ovarian cancer, history of PE, DVT, and degenerative disc disease  OBJECTIVE:   BP 134/88   Ht 5\' 4"  (1.626 m)   Wt 152 lb (68.9 kg)   BMI 26.09 kg/m   No flowsheet data found.  MSK: Examination of the left foot significant for mild swelling over the base of the fifth metatarsal with slight tenderness to palpation.  Imaging: Left foot AP, lateral, and oblique views were obtained showing no significant change in the fracture at the base of the fifth metatarsal.  No bony callus formation noted.  ASSESSMENT/PLAN:   Nondisplaced fracture of fifth metatarsal bone, left foot, initial encounter for closed fracture Repeat x-rays now 6 weeks out from her initial visit show no worsening but no bony callus formation does not appear to have healed.   Given that she is still having some symptoms as well we will send her to Dr. Lucia Gaskins to get his opinion on best form of management for her at this point.  We will see her back in 4 weeks after she has seen Dr. Lucia Gaskins.  Bilateral carpal tunnel syndrome Symptoms and history consistent with 4 weeks of bilateral carpal tunnel syndrome.  Could also be related to chemotherapy she had for ovarian cancer.  Either way we will proceed with conservative management starting her on a prednisone Dosepak today and we will start occupational/hand therapy and follow-up with her in 4 weeks.     Nuala Alpha, DO PGY-4, Sports Medicine Fellow [place]  Patient seen and evaluated with the sports medicine fellow.  I agree with the above plan of care.  Follow-up x-rays show no real callus formation.  Although she continues to do well clinically, I am concerned about this progressing to a malunion or nonunion.  I would like for her to see Dr. Lucia Gaskins for his opinion regarding treatment going forward.  I will reevaluate her again in 4 weeks.  In the meantime, continue with treatment as above for bilateral carpal tunnel syndrome and we will add a 6-day Sterapred Dosepak as well.

## 2020-06-27 DIAGNOSIS — S92302A Fracture of unspecified metatarsal bone(s), left foot, initial encounter for closed fracture: Secondary | ICD-10-CM | POA: Diagnosis not present

## 2020-07-02 ENCOUNTER — Other Ambulatory Visit: Payer: Self-pay | Admitting: *Deleted

## 2020-07-02 DIAGNOSIS — S92355D Nondisplaced fracture of fifth metatarsal bone, left foot, subsequent encounter for fracture with routine healing: Secondary | ICD-10-CM

## 2020-07-09 ENCOUNTER — Other Ambulatory Visit (HOSPITAL_COMMUNITY): Payer: Self-pay

## 2020-07-09 MED FILL — Gabapentin Cap 300 MG: ORAL | 90 days supply | Qty: 270 | Fill #1 | Status: CN

## 2020-07-09 MED FILL — Apixaban Tab 5 MG: ORAL | 30 days supply | Qty: 60 | Fill #3 | Status: AC

## 2020-07-10 DIAGNOSIS — M25541 Pain in joints of right hand: Secondary | ICD-10-CM | POA: Diagnosis not present

## 2020-07-10 DIAGNOSIS — R202 Paresthesia of skin: Secondary | ICD-10-CM | POA: Diagnosis not present

## 2020-07-10 DIAGNOSIS — M25542 Pain in joints of left hand: Secondary | ICD-10-CM | POA: Diagnosis not present

## 2020-07-12 ENCOUNTER — Other Ambulatory Visit (HOSPITAL_COMMUNITY): Payer: Self-pay

## 2020-07-12 MED FILL — Gabapentin Cap 300 MG: ORAL | 90 days supply | Qty: 270 | Fill #1 | Status: AC

## 2020-07-26 ENCOUNTER — Ambulatory Visit: Payer: 59 | Admitting: Sports Medicine

## 2020-07-27 ENCOUNTER — Inpatient Hospital Stay: Payer: 59

## 2020-07-27 ENCOUNTER — Telehealth: Payer: Self-pay | Admitting: *Deleted

## 2020-07-27 NOTE — Telephone Encounter (Signed)
Returned the patient's call and rescheduled her appts by one week. Patient was COVID +

## 2020-07-31 ENCOUNTER — Other Ambulatory Visit: Payer: 59

## 2020-08-01 ENCOUNTER — Ambulatory Visit: Payer: 59 | Admitting: Gynecologic Oncology

## 2020-08-01 ENCOUNTER — Ambulatory Visit
Admission: RE | Admit: 2020-08-01 | Discharge: 2020-08-01 | Disposition: A | Discharge status: Home / Self Care | Payer: 59 | Source: Ambulatory Visit | Attending: Sports Medicine | Admitting: Sports Medicine

## 2020-08-01 ENCOUNTER — Other Ambulatory Visit: Payer: 59

## 2020-08-01 ENCOUNTER — Other Ambulatory Visit: Payer: Self-pay

## 2020-08-01 DIAGNOSIS — S92352A Displaced fracture of fifth metatarsal bone, left foot, initial encounter for closed fracture: Secondary | ICD-10-CM | POA: Diagnosis not present

## 2020-08-01 DIAGNOSIS — S92355D Nondisplaced fracture of fifth metatarsal bone, left foot, subsequent encounter for fracture with routine healing: Secondary | ICD-10-CM

## 2020-08-02 ENCOUNTER — Ambulatory Visit: Payer: 59 | Admitting: Sports Medicine

## 2020-08-02 VITALS — Ht 64.0 in | Wt 152.0 lb

## 2020-08-02 DIAGNOSIS — S92355A Nondisplaced fracture of fifth metatarsal bone, left foot, initial encounter for closed fracture: Secondary | ICD-10-CM

## 2020-08-02 DIAGNOSIS — S92355D Nondisplaced fracture of fifth metatarsal bone, left foot, subsequent encounter for fracture with routine healing: Secondary | ICD-10-CM | POA: Diagnosis not present

## 2020-08-02 NOTE — Progress Notes (Signed)
   Subjective:    Patient ID: Abelardo Diesel, female    DOB: 1960/12/26, 60 y.o.   MRN: [id number]  HPI  [place] is following up today on a left fifth metatarsal base fracture.  She saw Dr. Lucia Gaskins on June 15 who recommended continuing with conservative treatment.  She has been in her cam walker since that visit.  She has no pain.  She has been working with occupational therapy for her carpal tunnel pain and this seems to be going well.    Review of Systems As above    Objective:   Physical Exam Developed, well nourished.  No acute distress  Left lower leg: There is no tenderness to palpation at the base of the fifth metatarsal.  No soft tissue swelling.  No calf atrophy.  Excellent ankle range of motion.  Good pulses.  X-rays of the left foot including AP, lateral, and oblique views are reviewed.  The Jones fracture line is no longer appreciable.  The avulsion fracture line is still somewhat visible but with visible signs of callus formation.  Please note that the radiology report was pending at the time of this dictation.       Assessment & Plan:   80-month status post left fifth metatarsal base fracture  Clinically [place] is doing excellent.  I think we can discontinue her cam walker in favor of a simple med spec brace.  We will also give her some ankle strengthening exercises.  She may resume her exercise routine but I do not want her exercising on uneven ground until follow-up with me in 4 weeks.  She will also continue working with occupational therapy on her carpal tunnel symptoms.

## 2020-08-07 ENCOUNTER — Other Ambulatory Visit: Payer: Self-pay

## 2020-08-07 ENCOUNTER — Inpatient Hospital Stay: Payer: 59 | Attending: Hematology and Oncology

## 2020-08-07 DIAGNOSIS — I2699 Other pulmonary embolism without acute cor pulmonale: Secondary | ICD-10-CM

## 2020-08-07 DIAGNOSIS — Z86711 Personal history of pulmonary embolism: Secondary | ICD-10-CM | POA: Diagnosis not present

## 2020-08-07 DIAGNOSIS — R180 Malignant ascites: Secondary | ICD-10-CM

## 2020-08-07 DIAGNOSIS — C569 Malignant neoplasm of unspecified ovary: Secondary | ICD-10-CM

## 2020-08-07 DIAGNOSIS — I824Z3 Acute embolism and thrombosis of unspecified deep veins of distal lower extremity, bilateral: Secondary | ICD-10-CM

## 2020-08-07 DIAGNOSIS — Z9221 Personal history of antineoplastic chemotherapy: Secondary | ICD-10-CM | POA: Insufficient documentation

## 2020-08-07 DIAGNOSIS — Z86718 Personal history of other venous thrombosis and embolism: Secondary | ICD-10-CM | POA: Diagnosis not present

## 2020-08-07 DIAGNOSIS — R918 Other nonspecific abnormal finding of lung field: Secondary | ICD-10-CM

## 2020-08-07 DIAGNOSIS — M8580 Other specified disorders of bone density and structure, unspecified site: Secondary | ICD-10-CM | POA: Diagnosis not present

## 2020-08-07 DIAGNOSIS — Z780 Asymptomatic menopausal state: Secondary | ICD-10-CM | POA: Insufficient documentation

## 2020-08-07 DIAGNOSIS — Z8543 Personal history of malignant neoplasm of ovary: Secondary | ICD-10-CM | POA: Diagnosis not present

## 2020-08-07 DIAGNOSIS — Z90710 Acquired absence of both cervix and uterus: Secondary | ICD-10-CM | POA: Diagnosis not present

## 2020-08-07 DIAGNOSIS — R011 Cardiac murmur, unspecified: Secondary | ICD-10-CM | POA: Diagnosis not present

## 2020-08-07 DIAGNOSIS — D61818 Other pancytopenia: Secondary | ICD-10-CM

## 2020-08-07 DIAGNOSIS — Z90722 Acquired absence of ovaries, bilateral: Secondary | ICD-10-CM | POA: Diagnosis not present

## 2020-08-07 LAB — CBC WITH DIFFERENTIAL/PLATELET
Abs Immature Granulocytes: 0.01 10*3/uL (ref 0.00–0.07)
Basophils Absolute: 0 10*3/uL (ref 0.0–0.1)
Basophils Relative: 0 %
Eosinophils Absolute: 0.1 10*3/uL (ref 0.0–0.5)
Eosinophils Relative: 2 %
HCT: 34.6 % — ABNORMAL LOW (ref 36.0–46.0)
Hemoglobin: 11.8 g/dL — ABNORMAL LOW (ref 12.0–15.0)
Immature Granulocytes: 0 %
Lymphocytes Relative: 26 %
Lymphs Abs: 1.3 10*3/uL (ref 0.7–4.0)
MCH: 31.9 pg (ref 26.0–34.0)
MCHC: 34.1 g/dL (ref 30.0–36.0)
MCV: 93.5 fL (ref 80.0–100.0)
Monocytes Absolute: 0.5 10*3/uL (ref 0.1–1.0)
Monocytes Relative: 9 %
Neutro Abs: 3.1 10*3/uL (ref 1.7–7.7)
Neutrophils Relative %: 63 %
Platelets: 209 10*3/uL (ref 150–400)
RBC: 3.7 MIL/uL — ABNORMAL LOW (ref 3.87–5.11)
RDW: 11.5 % (ref 11.5–15.5)
WBC: 5 10*3/uL (ref 4.0–10.5)
nRBC: 0 % (ref 0.0–0.2)

## 2020-08-07 LAB — COMPREHENSIVE METABOLIC PANEL
ALT: 12 U/L (ref 0–44)
AST: 20 U/L (ref 15–41)
Albumin: 3.9 g/dL (ref 3.5–5.0)
Alkaline Phosphatase: 64 U/L (ref 38–126)
Anion gap: 9 (ref 5–15)
BUN: 21 mg/dL (ref 8–23)
CO2: 25 mmol/L (ref 22–32)
Calcium: 9.6 mg/dL (ref 8.9–10.3)
Chloride: 105 mmol/L (ref 98–111)
Creatinine, Ser: 0.94 mg/dL (ref 0.44–1.00)
GFR, Estimated: >60 mL/min (ref >60)
Glucose, Bld: 97 mg/dL (ref 70–99)
Potassium: 3.7 mmol/L (ref 3.5–5.1)
Sodium: 139 mmol/L (ref 135–145)
Total Bilirubin: 0.3 mg/dL (ref 0.3–1.2)
Total Protein: 7.2 g/dL (ref 6.5–8.1)

## 2020-08-08 ENCOUNTER — Inpatient Hospital Stay (HOSPITAL_BASED_OUTPATIENT_CLINIC_OR_DEPARTMENT_OTHER): Payer: 59 | Admitting: Gynecologic Oncology

## 2020-08-08 ENCOUNTER — Telehealth: Payer: Self-pay

## 2020-08-08 VITALS — BP 139/95 | HR 82 | Temp 97.8°F | Resp 18 | Ht 64.0 in | Wt 148.4 lb

## 2020-08-08 DIAGNOSIS — Z90722 Acquired absence of ovaries, bilateral: Secondary | ICD-10-CM | POA: Diagnosis not present

## 2020-08-08 DIAGNOSIS — Z8543 Personal history of malignant neoplasm of ovary: Secondary | ICD-10-CM | POA: Diagnosis not present

## 2020-08-08 DIAGNOSIS — Z9221 Personal history of antineoplastic chemotherapy: Secondary | ICD-10-CM | POA: Diagnosis not present

## 2020-08-08 DIAGNOSIS — Z90710 Acquired absence of both cervix and uterus: Secondary | ICD-10-CM | POA: Diagnosis not present

## 2020-08-08 DIAGNOSIS — Z86711 Personal history of pulmonary embolism: Secondary | ICD-10-CM | POA: Diagnosis not present

## 2020-08-08 DIAGNOSIS — R011 Cardiac murmur, unspecified: Secondary | ICD-10-CM | POA: Diagnosis not present

## 2020-08-08 DIAGNOSIS — Z780 Asymptomatic menopausal state: Secondary | ICD-10-CM | POA: Diagnosis not present

## 2020-08-08 DIAGNOSIS — C569 Malignant neoplasm of unspecified ovary: Secondary | ICD-10-CM

## 2020-08-08 DIAGNOSIS — Z86718 Personal history of other venous thrombosis and embolism: Secondary | ICD-10-CM | POA: Diagnosis not present

## 2020-08-08 DIAGNOSIS — M8580 Other specified disorders of bone density and structure, unspecified site: Secondary | ICD-10-CM | POA: Diagnosis not present

## 2020-08-08 LAB — CA 125: Cancer Antigen (CA) 125: 8.1 U/mL (ref 0.0–38.1)

## 2020-08-08 NOTE — Telephone Encounter (Signed)
-----   Message from Heath Lark, MD sent at 08/08/2020  8:21 AM EDT ----- Pls let her know CA-125 is ok

## 2020-08-08 NOTE — Patient Instructions (Signed)
Dr Denman George recommends returning to see Dr Alvy Bimler in 3 months. Her office will schedule you to see Dr Denman George in January, 2023.

## 2020-08-08 NOTE — Telephone Encounter (Signed)
RN notified patient of lab results, verbalized understanding.  No further needs at this time.

## 2020-08-08 NOTE — Progress Notes (Signed)
Follow-up Note: Gyn-Onc  Consult was requested by Dr. Gorsuch for the evaluation of Kristina Flores 60 y.o. female  CC:  Chief Complaint  Patient presents with   Malignant neoplasm of ovary, unspecified laterality (HCC)    Assessment/Plan:  Kristina Flores  is a 60 y.o.  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 s/p neoadjuvant chemotherapy, interval cytoreduction (complete) on 01/10/20, adjuvant carboplatin and paclitaxel completed on 03/21/20.  Complete clinical response, no evidence of recurrence.  She will follow-up in 3 months with Dr Gorsuch and with me in 3 months.     HPI: Kristina Flores is a very pleasant 59 year old P3 who was seen in consultation at the request of Dr Gorsuch for evaluation of stage IIIc high-grade serous ovarian cancer.  The patient reported feeling vague symptoms of abdominal bloating and early satiety in late August and early September 2021.  She attributed this to some decreased activity due to her recent fractured toe.  She mentioned to her primary care physician who performed a pelvic exam which was unremarkable at that time and ordered a plain abdominal x-ray which showed stool burden but no other changes.  The patient subsequently departed for a planned trip to [place]  While out camping in [place] she developed severe shortness of breath and progressive abdominal distention and discomfort.  This caught her camping trip short and she was seen by local emergency department in [place] where she was admitted and CT scans were performed.  The CT scans showed pulmonary embolisms in all lobes.  A large right central pelvic mass which is likely an ovarian neoplasm measuring at least 11 cm.  There were omental and peritoneal metastases with significant ascites.  A small left uterine calcification consistent with a fibroid was seen.  There was small right and left  lung base nodules which could represent sequelae of previous infection or possible metastases.  She was admitted to a hospital in [place] and an IVC filter was placed on September 30, 2019. Tumor markers on October 03, 2019 included a Ca1 25 which was elevated at 713.  CA 19-9 was elevated to 100.  CEA was normal at 2.9. Paracentesis was performed on September 30, 2019 and showed adenocarcinoma. CT-guided biopsy of the pelvic mass on October 03, 2019 showed adenocarcinoma with malignant stains positive for keratin, CK7 and MOC-31.  There were negative for CK20 calretinin CDX2, ER, and WT 1.  The findings were consistent with a poorly differentiated adenocarcinoma of possible gynecologic primary.  Pancreas biliary urothelial primaries could also be considered.  She received her first cycle of carboplatin and paclitaxel as an inpatient in [place] on October 23, 2019.  She had been started on Eliquis for the DVT and PE.  An IVC filter had been placed as stated above.  She was able to return to [place] from [place] after her 1st cycle of chemotherapy and established care with Dr Rossi and Dr Gorsuch from [facility].  She completed her 4th cycle of chemotherapy with carboplatin and paclitaxel on 12/13/19.  CA 125 on 11/16/19, which was day 1 of cycle 3, was normal at 30.  CT chest, abdomen and pelvis on 12/12/19 showed a solid mass in the right ovary measuring 4.2 x 4.2 x 3.3 cm. Uterus and left ovary are unremarkable in appearance. Posteriorly to this there was a 4.9   x 4.1 x 4.6 cm macrolobulated cystic lesion with multiple thick internal septations. Overall, the combined size of these lesions or this lesion appears decreased compared to the prior examination from 10/05/2019, indicating a positive response to therapy.No new lesions were seen. There was reduction in ascites present.   It was determined that she had demonstrated good partial response to 4 cycles of neoadjuvant chemotherapy, a 3  month time period had elapsed since her PE, and was then a good candidate for interval surgical cytoreduction.   On 01/10/20 she underwent robotic assisted total hysterectomy, BSO, omentectomy. Intraoperative findings were significant for a bulky right ovarian mass measuring approximately 6 cm, mild nodularity to the left ovary, tumor nodules on the peritoneum of the pelvis which were all resected, no gross omental disease.  No gross residual tumor at the completion of the procedure representing complete, optimal cytoreduction. Surgery was uncomplicated.  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  The focus of cancer that was residual measured 2 mm in greatest dimension.  There was a 1 mm focus of metastatic cancer in the left fallopian tube.  All other foci appeared necrotic with hemorrhagic nodules with no viable tissue remaining.  Chemotherapy response score was 3 with near complete response.  BRCA negative on genetics. CA 125 revealed normalization at the completion of therapy (14.2 on 02/29/20).  Interval Hx:  She returned today for follow-up with many questions but no complaints concerning for recurrence.   Current Meds:  Outpatient Encounter Medications as of 08/08/2020  Medication Sig   Alpha-Lipoic Acid 300 MG CAPS Take 1-2 capsules (300-600 mg total) by mouth once daily   cetirizine (ZYRTEC) 10 MG tablet Chew 10 mg by mouth daily.   Cholecalciferol 50 MCG (2000 UT) CAPS Take 2,000 Units by mouth daily.   cyclobenzaprine (FLEXERIL) 10 MG tablet Take 1 tablet (10 mg total) by mouth at bedtime as needed for muscle spasms.   ELIQUIS 5 MG TABS tablet TAKE 1 TABLET BY MOUTH TWO TIMES DAILY   gabapentin (NEURONTIN) 300 MG capsule TAKE 1 CAPSULE BY MOUTH THREE TIMES DAILY (Patient taking differently: Take 300 mg by mouth 3 (three) times daily.)   Melatonin 10 MG TABS Take 10 mg by mouth at bedtime.   ondansetron (ZOFRAN) 8 MG tablet Take by mouth every 8  (eight) hours as needed for nausea or vomiting.   predniSONE (STERAPRED UNI-PAK 21 TAB) 10 MG (21) TBPK tablet Take as directed per doctor orders   Facility-Administered Encounter Medications as of 08/08/2020  Medication   iohexol (OMNIPAQUE) 350 MG/ML injection 100 mL    Allergy: No Known Allergies  Social Hx:   Social History   Socioeconomic History   Marital status: Married    Spouse name: Robert   Number of children: 3   Years of education: College   Highest education level: Not on file  Occupational History   Occupation: PRESCHOOL TEACHER    Employer: [employer]  Tobacco Use   Smoking status: Never   Smokeless tobacco: Never  Vaping Use   Vaping Use: Never used  Substance and Sexual Activity   Alcohol use: Not Currently    Comment: occasional   Drug use: No   Sexual activity: Yes    Partners: Male    Birth control/protection: Post-menopausal  Other Topics Concern   Not on file  Social History Narrative   Patient is married (Robert) and lives at home with her husband. Married x 36 years.     Patient has three adult children; 2 in [place] (30, 28); one in [place] (25).  No grandchildren in 2019..   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   Patient has a college education.   Patient is right-handed.   Patient drinks one cup of coffee daily.   Pillates three times per week for sixteen years.     Social Determinants of Health   Financial Resource Strain: Not on file  Food Insecurity: Not on file  Transportation Needs: Not on file  Physical Activity: Not on file  Stress: Not on file  Social Connections: Not on file  Intimate Partner Violence: Not on file    Past Surgical Hx:  Past Surgical History:  Procedure Laterality Date   fibroid in 2011,2003     IR IVC FILTER RETRIEVAL / S&I /IMG GUID/MOD SED  04/26/2020   IR RADIOLOGIST EVAL & MGMT  02/10/2020   miscarriage 1989     ovarian cyst 1992     Robotic-assisted  laparoscopic total hysterectomy with bilateral salpingoophorectomy, omentectomy, radical tumor debulking   01/10/2020    Past Medical Hx:  Past Medical History:  Diagnosis Date   Allergy    Anemia    Cancer (HCC)    ovarian   DVT (deep venous thrombosis) (HCC)    Heart murmur    benign never an issue   Numbness and tingling    Osteopenia 08/2016   T score -1.6 FRAX 7.8%/0.7%   Peripheral neuropathy, idiopathic    Pulmonary embolism (HCC)     Past Gynecological History:  SVD x 3 No LMP recorded. Patient is postmenopausal.  Family Hx:  Family History  Problem Relation Age of Onset   Leukemia Mother 28   Arthritis Father    Colon cancer Father 70   Prostate cancer Father 62   Breast cancer Maternal Grandmother 66   Leukemia Maternal Grandfather 57   Colon cancer Paternal Uncle 57   Colon cancer Cousin 61       paternal female cousin   Throat cancer Cousin 60       paternal female cousin   Prostate cancer Cousin        paternal cousin; dx early 60s; metastatic   Esophageal cancer Neg Hx    Rectal cancer Neg Hx    Stomach cancer Neg Hx     Review of Systems:  Constitutional  Feels fatigued ENT Normal appearing ears and nares bilaterally Skin/Breast  No rash, sores, jaundice, itching, dryness Cardiovascular  No chest pain, shortness of breath, or edema  Pulmonary  No cough or wheeze.  Gastro Intestinal  resolved bloating and early satiety, Genito Urinary  No frequency, urgency, dysuria, no bleeding Musculo Skeletal  No myalgia, arthralgia, joint swelling or pain  Neurologic  No weakness, numbness, change in gait,  Psychology  No depression, anxiety, insomnia.   Vitals:  Blood pressure (!) 139/95, pulse 82, temperature 97.8 F (36.6 C), temperature source Tympanic, resp. rate 18, height 5' 4" (1.626 m), weight 148 lb 6.4 oz (67.3 kg), SpO2 97 %.  Physical Exam: WD in NAD Neck  Supple NROM, without any enlargements.  Lymph Node Survey No cervical  supraclavicular or inguinal adenopathy Cardiovascular  Well perfused peripheries Lungs  No increased WOB Skin  No rash/lesions/breakdown  Psychiatry  Alert and oriented to person, place, and time  Abdomen  Normoactive bowel sounds, abdomen soft, non-tender and nonobese without evidence of hernia. Incisions soft. Back No CVA tenderness Genito Urinary    Vaginal cuff smooth, in tact, no palpable masses, no bleeding.  Rectal  Good tone, no mass as previously palpated.  no cul de sac nodularity.  Extremities  No bilateral cyanosis, clubbing or edema.  Emma C Rossi, MD  08/08/2020, 3:18 PM     

## 2020-08-14 ENCOUNTER — Other Ambulatory Visit (HOSPITAL_COMMUNITY): Payer: Self-pay

## 2020-08-14 MED FILL — Apixaban Tab 5 MG: ORAL | 30 days supply | Qty: 60 | Fill #4 | Status: AC

## 2020-08-15 DIAGNOSIS — Z0000 Encounter for general adult medical examination without abnormal findings: Secondary | ICD-10-CM | POA: Diagnosis not present

## 2020-08-16 DIAGNOSIS — Z23 Encounter for immunization: Secondary | ICD-10-CM | POA: Diagnosis not present

## 2020-08-16 DIAGNOSIS — C569 Malignant neoplasm of unspecified ovary: Secondary | ICD-10-CM | POA: Diagnosis not present

## 2020-08-16 DIAGNOSIS — Z1331 Encounter for screening for depression: Secondary | ICD-10-CM | POA: Diagnosis not present

## 2020-08-16 DIAGNOSIS — Z0000 Encounter for general adult medical examination without abnormal findings: Secondary | ICD-10-CM | POA: Diagnosis not present

## 2020-08-16 DIAGNOSIS — D6859 Other primary thrombophilia: Secondary | ICD-10-CM | POA: Diagnosis not present

## 2020-08-16 DIAGNOSIS — R82998 Other abnormal findings in urine: Secondary | ICD-10-CM | POA: Diagnosis not present

## 2020-08-16 DIAGNOSIS — R42 Dizziness and giddiness: Secondary | ICD-10-CM | POA: Diagnosis not present

## 2020-08-16 DIAGNOSIS — J309 Allergic rhinitis, unspecified: Secondary | ICD-10-CM | POA: Diagnosis not present

## 2020-08-16 DIAGNOSIS — Z86711 Personal history of pulmonary embolism: Secondary | ICD-10-CM | POA: Diagnosis not present

## 2020-08-16 DIAGNOSIS — Z1339 Encounter for screening examination for other mental health and behavioral disorders: Secondary | ICD-10-CM | POA: Diagnosis not present

## 2020-08-16 DIAGNOSIS — S92355A Nondisplaced fracture of fifth metatarsal bone, left foot, initial encounter for closed fracture: Secondary | ICD-10-CM | POA: Diagnosis not present

## 2020-08-16 DIAGNOSIS — G629 Polyneuropathy, unspecified: Secondary | ICD-10-CM | POA: Diagnosis not present

## 2020-08-16 DIAGNOSIS — R030 Elevated blood-pressure reading, without diagnosis of hypertension: Secondary | ICD-10-CM | POA: Diagnosis not present

## 2020-08-16 DIAGNOSIS — M8580 Other specified disorders of bone density and structure, unspecified site: Secondary | ICD-10-CM | POA: Diagnosis not present

## 2020-08-20 ENCOUNTER — Other Ambulatory Visit: Payer: Self-pay | Admitting: *Deleted

## 2020-08-20 DIAGNOSIS — M85851 Other specified disorders of bone density and structure, right thigh: Secondary | ICD-10-CM

## 2020-09-04 ENCOUNTER — Ambulatory Visit (INDEPENDENT_AMBULATORY_CARE_PROVIDER_SITE_OTHER): Payer: 59 | Admitting: Sports Medicine

## 2020-09-04 ENCOUNTER — Ambulatory Visit
Admission: RE | Admit: 2020-09-04 | Discharge: 2020-09-04 | Disposition: A | Discharge status: Home / Self Care | Payer: 59 | Source: Ambulatory Visit | Attending: Sports Medicine | Admitting: Sports Medicine

## 2020-09-04 ENCOUNTER — Other Ambulatory Visit: Payer: Self-pay

## 2020-09-04 VITALS — BP 130/92 | Ht 64.0 in | Wt 155.0 lb

## 2020-09-04 DIAGNOSIS — S92355D Nondisplaced fracture of fifth metatarsal bone, left foot, subsequent encounter for fracture with routine healing: Secondary | ICD-10-CM

## 2020-09-04 DIAGNOSIS — S92352A Displaced fracture of fifth metatarsal bone, left foot, initial encounter for closed fracture: Secondary | ICD-10-CM | POA: Diagnosis not present

## 2020-09-04 NOTE — Progress Notes (Signed)
PCP: Ginger Organ., MD  Subjective:   HPI: Patient is a 60 y.o. female here for f/u of left fifth metatarsal base fracture.  Patient states she has been doing very well with her conservative treatment.  She has been walking several times a week without any pain.  She does wear the Ace ankle support brace when she will be walking on uneven ground.  She continues to have no pain.  No new injuries.  She does have occasional residual aching pain on the dorsum of her midfoot, but she does endorse that she has some neuropathy from her prior cancer treatments.  Otherwise, she is feeling well and would like to get back to additional exercises.   Past Medical History:  Diagnosis Date   Allergy    Anemia    Cancer ([place])    ovarian   DVT (deep venous thrombosis) (HCC)    Heart murmur    benign never an issue   Numbness and tingling    Osteopenia 08/2016   T score -1.6 FRAX 7.8%/0.7%   Peripheral neuropathy, idiopathic    Pulmonary embolism (HCC)     BP (!) 130/92   Ht '5\' 4"'$  (1.626 m)   Wt 155 lb (70.3 kg)   BMI 26.61 kg/m   [place] Adult Exercise 09/04/2020  Frequency of aerobic exercise (# of days/week) 4  Average time in minutes 45  Frequency of strengthening activities (# of days/week) 4    No flowsheet data found.  Review of Systems: See HPI above.     Objective:  Physical Exam:  Gen: NAD, comfortable in exam room  - Left ankle/foot: There is no tenderness to palpation at the base of the fifth metatarsal.  Skin is without erythema, ecchymosis, or swelling.  There is no soft tissue swelling or calf swelling.  She has full range of motion at the level of the ankle in all directions.  5/5 strength in all directions.  Neurovascularly intact.  She is able to walk, as well as plantarflex and go up onto her toes without any pain or instability.    Assessment & Plan:  1. Hx of left fifth metatarsal base fracture about 10-month ago - healing well.  ASamaiais  doing very well and has resumed walking multiple times weekly without any pain.  She has great strength, and no residual tenderness to palpation or with provocative maneuvers on exam.  At this point, she can pursue additional exercise activity such as biking, yoga letting pain be her guide.  She may come out of the ankle brace when she is walking on flat surfaces, however we did discuss wearing this as protective/preventative measure if she will be on uneven ground such as hiking, walking the beach for the next few months.  We will repeat 1 additional x-ray to assess for healing of the proximal fracture site.  Follow-up only as needed.  DElba Barman DO PGY-4, Sports Medicine Fellow [facility]

## 2020-09-11 ENCOUNTER — Other Ambulatory Visit (HOSPITAL_COMMUNITY): Payer: Self-pay

## 2020-09-11 MED FILL — Apixaban Tab 5 MG: ORAL | 30 days supply | Qty: 60 | Fill #5 | Status: AC

## 2020-09-12 ENCOUNTER — Other Ambulatory Visit (HOSPITAL_COMMUNITY): Payer: Self-pay

## 2020-09-19 ENCOUNTER — Telehealth: Payer: Self-pay

## 2020-09-19 NOTE — Telephone Encounter (Signed)
Called and moved appt with Dr. Alvy Bimler to 10/13 at 1200. She is aware of appt time.

## 2020-10-12 MED FILL — Apixaban Tab 5 MG: ORAL | 30 days supply | Qty: 60 | Fill #6 | Status: AC

## 2020-10-15 ENCOUNTER — Other Ambulatory Visit (HOSPITAL_COMMUNITY): Payer: Self-pay

## 2020-10-16 ENCOUNTER — Other Ambulatory Visit (HOSPITAL_COMMUNITY): Payer: Self-pay

## 2020-10-19 ENCOUNTER — Other Ambulatory Visit (HOSPITAL_COMMUNITY): Payer: Self-pay

## 2020-10-23 ENCOUNTER — Other Ambulatory Visit (HOSPITAL_COMMUNITY): Payer: Self-pay

## 2020-10-23 ENCOUNTER — Telehealth: Payer: Self-pay

## 2020-10-23 NOTE — Telephone Encounter (Signed)
Called and left a messge. 9/13 appt needs to be moved with Dr. Alvy Bimler. Could she come in at 3 pm that same day. Ask for call back.

## 2020-10-23 NOTE — Telephone Encounter (Signed)
She called back. Appt moved to 3 pm.

## 2020-10-24 ENCOUNTER — Other Ambulatory Visit: Payer: Self-pay

## 2020-10-24 ENCOUNTER — Other Ambulatory Visit (HOSPITAL_COMMUNITY): Payer: Self-pay

## 2020-10-24 ENCOUNTER — Inpatient Hospital Stay: Payer: 59 | Attending: Hematology and Oncology

## 2020-10-24 DIAGNOSIS — I824Z3 Acute embolism and thrombosis of unspecified deep veins of distal lower extremity, bilateral: Secondary | ICD-10-CM

## 2020-10-24 DIAGNOSIS — Z86718 Personal history of other venous thrombosis and embolism: Secondary | ICD-10-CM | POA: Insufficient documentation

## 2020-10-24 DIAGNOSIS — R918 Other nonspecific abnormal finding of lung field: Secondary | ICD-10-CM

## 2020-10-24 DIAGNOSIS — I2699 Other pulmonary embolism without acute cor pulmonale: Secondary | ICD-10-CM

## 2020-10-24 DIAGNOSIS — G609 Hereditary and idiopathic neuropathy, unspecified: Secondary | ICD-10-CM | POA: Diagnosis not present

## 2020-10-24 DIAGNOSIS — C569 Malignant neoplasm of unspecified ovary: Secondary | ICD-10-CM

## 2020-10-24 DIAGNOSIS — Z8543 Personal history of malignant neoplasm of ovary: Secondary | ICD-10-CM | POA: Insufficient documentation

## 2020-10-24 DIAGNOSIS — D61818 Other pancytopenia: Secondary | ICD-10-CM

## 2020-10-24 DIAGNOSIS — R180 Malignant ascites: Secondary | ICD-10-CM

## 2020-10-24 LAB — CMP (CANCER CENTER ONLY)
ALT: 18 U/L (ref 0–44)
AST: 24 U/L (ref 15–41)
Albumin: 4.5 g/dL (ref 3.5–5.0)
Alkaline Phosphatase: 53 U/L (ref 38–126)
Anion gap: 10 (ref 5–15)
BUN: 17 mg/dL (ref 8–23)
CO2: 26 mmol/L (ref 22–32)
Calcium: 10 mg/dL (ref 8.9–10.3)
Chloride: 106 mmol/L (ref 98–111)
Creatinine: 0.85 mg/dL (ref 0.44–1.00)
GFR, Estimated: >60 mL/min (ref >60)
Glucose, Bld: 85 mg/dL (ref 70–99)
Potassium: 4.4 mmol/L (ref 3.5–5.1)
Sodium: 142 mmol/L (ref 135–145)
Total Bilirubin: 0.6 mg/dL (ref 0.3–1.2)
Total Protein: 7.3 g/dL (ref 6.5–8.1)

## 2020-10-24 LAB — CBC WITH DIFFERENTIAL/PLATELET
Abs Immature Granulocytes: 0.01 10*3/uL (ref 0.00–0.07)
Basophils Absolute: 0 10*3/uL (ref 0.0–0.1)
Basophils Relative: 1 %
Eosinophils Absolute: 0.1 10*3/uL (ref 0.0–0.5)
Eosinophils Relative: 1 %
HCT: 35 % — ABNORMAL LOW (ref 36.0–46.0)
Hemoglobin: 12.2 g/dL (ref 12.0–15.0)
Immature Granulocytes: 0 %
Lymphocytes Relative: 24 %
Lymphs Abs: 1.2 10*3/uL (ref 0.7–4.0)
MCH: 32.2 pg (ref 26.0–34.0)
MCHC: 34.9 g/dL (ref 30.0–36.0)
MCV: 92.3 fL (ref 80.0–100.0)
Monocytes Absolute: 0.4 10*3/uL (ref 0.1–1.0)
Monocytes Relative: 8 %
Neutro Abs: 3.2 10*3/uL (ref 1.7–7.7)
Neutrophils Relative %: 66 %
Platelets: 215 10*3/uL (ref 150–400)
RBC: 3.79 MIL/uL — ABNORMAL LOW (ref 3.87–5.11)
RDW: 12.4 % (ref 11.5–15.5)
WBC: 4.9 10*3/uL (ref 4.0–10.5)
nRBC: 0 % (ref 0.0–0.2)

## 2020-10-24 MED ORDER — GABAPENTIN 300 MG PO CAPS
300.0000 mg | ORAL_CAPSULE | Freq: Three times a day (TID) | ORAL | 1 refills | Status: DC
  Filled 2020-10-24: qty 270, 90d supply, fill #0
  Filled 2021-01-15: qty 270, 90d supply, fill #1

## 2020-10-25 ENCOUNTER — Inpatient Hospital Stay: Payer: 59 | Admitting: Hematology and Oncology

## 2020-10-25 ENCOUNTER — Inpatient Hospital Stay (HOSPITAL_BASED_OUTPATIENT_CLINIC_OR_DEPARTMENT_OTHER): Payer: 59 | Admitting: Hematology and Oncology

## 2020-10-25 ENCOUNTER — Encounter: Payer: Self-pay | Admitting: Hematology and Oncology

## 2020-10-25 ENCOUNTER — Other Ambulatory Visit (HOSPITAL_COMMUNITY): Payer: Self-pay

## 2020-10-25 DIAGNOSIS — C569 Malignant neoplasm of unspecified ovary: Secondary | ICD-10-CM | POA: Diagnosis not present

## 2020-10-25 DIAGNOSIS — Z8543 Personal history of malignant neoplasm of ovary: Secondary | ICD-10-CM | POA: Diagnosis not present

## 2020-10-25 DIAGNOSIS — Z86718 Personal history of other venous thrombosis and embolism: Secondary | ICD-10-CM | POA: Diagnosis not present

## 2020-10-25 DIAGNOSIS — G609 Hereditary and idiopathic neuropathy, unspecified: Secondary | ICD-10-CM

## 2020-10-25 LAB — CA 125: Cancer Antigen (CA) 125: 9 U/mL (ref 0.0–38.1)

## 2020-10-25 NOTE — Assessment & Plan Note (Signed)
She has persistent neuropathy that predates treatment She will continue gabapentin

## 2020-10-25 NOTE — Assessment & Plan Note (Signed)
Clinically, she has no signs of cancer recurrence I recommend close follow-up with tumor marker monitoring every 3 months The patient is educated to watch out for signs and symptoms of cancer recurrence

## 2020-10-25 NOTE — Assessment & Plan Note (Signed)
She has completed a years worth of anticoagulation therapy When she runs of her Eliquis, she can stop anticoagulation

## 2020-10-25 NOTE — Progress Notes (Signed)
[facility] OFFICE PROGRESS NOTE  Patient Care Team: Shaw, William D Jr., MD as PCP - General (Internal Medicine) Hess, Karen R, RN as Oncology Nurse Navigator (Oncology)  ASSESSMENT & PLAN:  Ovarian cancer (HCC) Clinically, she has no signs of cancer recurrence I recommend close follow-up with tumor marker monitoring every 3 months The patient is educated to watch out for signs and symptoms of cancer recurrence  Idiopathic neuropathy She has persistent neuropathy that predates treatment She will continue gabapentin  Personal history of venous thrombosis and embolism She has completed a years worth of anticoagulation therapy When she runs of her Eliquis, she can stop anticoagulation  No orders of the defined types were placed in this encounter.   All questions were answered. The patient knows to call the clinic with any problems, questions or concerns. The total time spent in the appointment was 20 minutes encounter with patients including review of chart and various tests results, discussions about plan of care and coordination of care plan   Ni Gorsuch, MD 10/25/2020 4:14 PM  INTERVAL HISTORY: Please see below for problem oriented charting. she returns for follow-up on history of ovarian cancer She is doing well No bleeding complications from Eliquis Her peripheral neuropathy is stable Denies abdominal pain, bloating or changes in bowel habits  REVIEW OF SYSTEMS:   Constitutional: Denies fevers, chills or abnormal weight loss Eyes: Denies blurriness of vision Ears, nose, mouth, throat, and face: Denies mucositis or sore throat Respiratory: Denies cough, dyspnea or wheezes Cardiovascular: Denies palpitation, chest discomfort or lower extremity swelling Gastrointestinal:  Denies nausea, heartburn or change in bowel habits Skin: Denies abnormal skin rashes Lymphatics: Denies new lymphadenopathy or easy bruising Behavioral/Psych: Mood is stable, no new  changes  All other systems were reviewed with the patient and are negative.  I have reviewed the past medical history, past surgical history, social history and family history with the patient and they are unchanged from previous note.  ALLERGIES:  has No Known Allergies.  MEDICATIONS:  Current Outpatient Medications  Medication Sig Dispense Refill   Alpha-Lipoic Acid 300 MG CAPS Take 1-2 capsules (300-600 mg total) by mouth once daily 60 capsule 11   cetirizine (ZYRTEC) 10 MG tablet Chew 10 mg by mouth daily.     Cholecalciferol 50 MCG (2000 UT) CAPS Take 2,000 Units by mouth daily.     cyclobenzaprine (FLEXERIL) 10 MG tablet Take 1 tablet (10 mg total) by mouth at bedtime as needed for muscle spasms. 30 tablet 0   gabapentin (NEURONTIN) 300 MG capsule TAKE 1 CAPSULE BY MOUTH THREE TIMES DAILY (Patient taking differently: Take 300 mg by mouth 3 (three) times daily.) 270 capsule 3   gabapentin (NEURONTIN) 300 MG capsule Take 1 capsule (300 mg total) by mouth 3 (three) times daily 270 capsule 1   Melatonin 10 MG TABS Take 10 mg by mouth at bedtime.     No current facility-administered medications for this visit.   Facility-Administered Medications Ordered in Other Visits  Medication Dose Route Frequency Provider Last Rate Last Admin   iohexol (OMNIPAQUE) 350 MG/ML injection 100 mL  100 mL Intravenous Once PRN Gorsuch, Ni, MD        SUMMARY OF ONCOLOGIC HISTORY: Oncology History Overview Note  Neg Genetics, unable to do HRD   Ovarian cancer (HCC)  09/20/2019 Initial Diagnosis   Presented to her primary care doctor with abdominal bloating.  Abdominal x-ray imaging was unremarkable.   09/29/2019 Imaging   CT of   the chest showed pulmonary embolism in all lobes.  Large right/central pelvic mass likely ovarian neoplasm with omental/peritoneal metastasis with significant free fluid in the abdomen and pelvis.  A small left uterine calcification could be seen in a degenerating fibroid.  Small  right and left lung base nodules, could represent sequela of previous infection or inflammation with metastasis not excluded.   09/29/2019 - 10/15/2019 Hospital Admission   She presented to the emergency department in [name] after complaints of leg pain with bilateral lower extremity edema.  D-dimer was elevated.  Lower extremity ultrasound demonstrated extensive bilateral DVT involving peroneal and soleal calf veins on the left and peroneal vein on the right.  CT abdomen and pelvis showed large pelvic mass with omental metastasis and ascites, worrisome for ovarian cancer.  CT imaging of the chest showed bilateral pulmonary nodules as well as evidence of pulmonary embolism.  She received anticoagulation therapy.  She was also found to be anemic, worrisome that she might have bleeding in the tumor and received transfusion.  Echocardiogram showed preserved ejection fraction but evidence of moderate right ventricular strain.  Subsequent biopsy showed cancer suspicious for ovarian primary and she received first cycle of neoadjuvant chemotherapy with carboplatin and taxol. She had IVC filter placement and temporary TPN   09/30/2019 Procedure   Ultrasound paracentesis was performed complicated by bleeding and received blood transfusion   09/30/2019 Procedure   She had IVC filter placement by interventional radiologist in [facility].   09/30/2019 Echocardiogram   Outside echocardiogram showed left ventricular ejection fraction around 79%.  Concentric left ventricular modeling.  Grade 1 diastolic pattern with normal left atrial pressure.  Right ventricle size is mildly enlarged.  Right ventricular systolic function is moderately reduced.  Right ventricle was not well visualized.  Aortic sclerosis without evidence of stenosis.  Moderate pleural effusion on the left region   10/02/2019 Procedure   EGD was performed which showed no evidence of acute bleeding.  Biopsy of the stomach showed benign gastric mucosa with reactive  gastropathy as may be seen in the healing phase of erosive gastritis.  Helicobacter organism testing is negative..   10/03/2019 Procedure   She underwent CT-guided biopsy of the pelvic mass.   10/03/2019 Tumor Marker   CEA level was 2.9.   10/03/2019 Pathology Results   Biopsy show adenocarcinoma.  The malignant cells stain positive for keratin AE1/AE3, CK7 and MOC-31.  Rare cells show weak reactivity with p63 and GA TA 3.  Cells are negative for CK20, calretinin, CDX2, TTF-1, ER and WT-1.  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  Possible consideration include, but not limited to, gynecological origin, pancreatico biliary and urothelial.   10/03/2019 Tumor Marker   Patient's tumor was tested for the following markers: CA-125 Results of the tumor marker test revealed 713 CA19-9 is 100   10/04/2019 Imaging   Tagged red blood cell scan equivocal for source of bleeding.   10/05/2019 Procedure   She underwent paracentesis.  IR removed 4100 cc of ascites fluid with significant blood noted.   10/05/2019 Imaging   CT abdomen and pelvis angiogram protocol show no obvious source of bleeding.   10/05/2019 Imaging   CT imaging of the abdomen and pelvis showed no evidence of postprocedural hemorrhage or discrete area of GI bleeding.  Large volume ascites is present.  Small pockets of intraperitoneal free air are present, likely related to recent paracentesis.  Abnormal mass in the pelvic region most  likely a neoplasm of uterine or ovarian origin.  Normal liver with benign cysts of the right hepatic dome.  Biliary tree and pancreas and spleen were age-appropriate   10/07/2019 Procedure   Ultrasound paracentesis was performed.   10/07/2019 -  Chemotherapy   The patient had neoadjuvant carboplatin and taxol for chemotherapy treatment.     10/08/2019 Imaging   CT angiogram of the abdominal aorta and iliofemoral test was  done showing 14 x 10 x 14 cm pelvic mass with mild to moderate ascites.  Normal appearance of arterial structures without evidence of compression.  No evidence of compression of the iliac vein or IVC.   10/11/2019 Imaging   Ultrasound shows gallbladder full of sludge.  Fatty liver changes.  Ascites present.   10/18/2019 Cancer Staging   Staging form: Ovary, Fallopian Tube, and Primary Peritoneal Carcinoma, AJCC 8th Edition - Clinical stage from 10/18/2019: FIGO Stage IIIC, calculated as Stage Unknown (cT3c, cNX, cM0) - Signed by Gorsuch, Ni, MD on 10/18/2019   10/21/2019 Tumor Marker   Patient's tumor was tested for the following markers: CA-125. Results of the tumor marker test revealed 257.   10/31/2019 - 03/21/2020 Chemotherapy   The patient had carboplatin and taxol for chemotherapy treatment.     11/22/2019 Tumor Marker   Patient's tumor was tested for the following markers: CA-125. Results of the tumor marker test revealed 30.7   12/12/2019 Imaging   1. Today's study demonstrates a positive response to therapy with regression of right ovarian lesion(s), and decreased volume of presumably malignant ascites. 2. No definitive evidence of metastatic disease in the thorax. Multiple tiny 2-4 mm pulmonary nodules are stable compared to prior examinations. Continued attention on follow-up studies is recommended to exclude the possibility of metastatic lesions    12/26/2019 Tumor Marker   Patient's tumor was tested for the following markers: CA-125 Results of the tumor marker test revealed 16.4   01/10/2020 Surgery   Surgeon: Rossi, Emma Caroline    Assistants: Dr Lisa Jackson-Moore (an MD assistant was necessary for tissue manipulation, management of robotic instrumentation, retraction and positioning due to the complexity of the case and hospital policies).  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    Post-operative Diagnosis: same   Operation: Robotic-assisted laparoscopic total hysterectomy with bilateral salpingoophorectomy, omentectomy, radical tumor debulking     Operative Findings:  : bulky right ovarian mass (approximately 6cm), mild nodularity to left ovary, tumor nodules on peritoneum of pelvis (all resected), no gross omental disease. No gross residual tumor at the completion of the procedure representing a complete (optimal) cytoreduction   01/10/2020 Pathology Results   Clinical History: Ovarian cancer ([facility])   FINAL MICROSCOPIC DIAGNOSIS:   A. UTERUS, CERVIX, BILATERAL TUBES AND OVARIES:  - Right ovary:       Microscopic foci of adenocarcinoma associated with extensive necrosis and hemosiderin deposition.       No ovarian surface involvement identified.       Paraovarian necrotic and hemorrhagic nodule.       See oncology table and comment.  - Left Fallopian tube:       Microscopic focus of adenocarcinoma involving submucosa.       See comment.  - Cervix:       Nabothian cyst.       No dysplasia or malignancy.  - Endometrium:       Inactive.       No   hyperplasia or malignancy.  - Right Fallopian tube:       Adhesions with focal hemosiderin deposition.       No malignancy identified.  - Left ovary:       Endosalpingosis.       No malignancy identified.   B. PERITONEAL NODULE, BIOPSY:  - Necrotic and hemorrhagic nodule with hemosiderin deposition.  - No viable malignancy identified.   C. OMENTUM:  - Foci of fibrosis associated with adhesions and hemosiderin deposition.  - No malignancy identified.  - One benign lymph node (0/1).   ONCOLOGY TABLE:  OVARY or FALLOPIAN TUBE or PRIMARY PERITONEUM: Resection  Procedure: Hysterectomy with bilateral salpingo-oophorectomy, peritoneal nodule biopsy and omentectomy.  Specimen Integrity: Intact.  Tumor Site: Right ovary.  Tumor Size: 5.2 x 4.5 x 3.8 cm, see comment.  Histologic Type: Endometrioid  adenocarcinoma.  Histologic Grade: FIGO grade 2.  Ovarian Surface Involvement: Not identified.  Fallopian Tube Surface Involvement: Not identified.  See comment.  Other Tissue/ Organ Involvement: Not identified.  Peritoneal/Ascitic Fluid Involvement: Not applicable.  Chemotherapy Response Score (CRS): Near complete response, CRS 3.  See  comment.  Regional Lymph Nodes: No lymph nodes submitted.  Distant Metastasis:       Distant Site(s) Involved: Not identified.  Pathologic Stage Classification (pTNM, AJCC 8th Edition): ypT2a, ypNX  Ancillary Studies: Can be performed if requested.  Representative Tumor Block: A6 and A7.    02/29/2020 Tumor Marker   Patient's tumor was tested for the following markers: CA-125 Results of the tumor marker test revealed 14.2   03/02/2020 Genetic Testing   Negative hereditary cancer genetic testing: no pathogenic variants detected in [facility]Next-Cancer +RNAinsight Panel.  The report date is March 02, 2020.  HRD testing was unable to be performed due to insufficient sample availability.   The CustomNext-Cancer+RNAinsight panel offered by [facility] includes sequencing and rearrangement analysis for the following 47 genes:  APC, ATM, AXIN2, BARD1, BMPR1A, BRCA1, BRCA2, BRIP1, CDH1, CDK4, CDKN2A, CHEK2, DICER1, EPCAM, GREM1, HOXB13, MEN1, MLH1, MSH2, MSH3, MSH6, MUTYH, NBN, NF1, NF2, NTHL1, PALB2, PMS2, POLD1, POLE, PTEN, RAD51C, RAD51D, RECQL, RET, SDHA, SDHAF2, SDHB, SDHC, SDHD, SMAD4, SMARCA4, STK11, TP53, TSC1, TSC2, and VHL.  RNA data is routinely analyzed for use in variant interpretation for all genes.   03/21/2020 Tumor Marker   Patient's tumor was tested for the following markers: CA-125 Results of the tumor marker test revealed 11.5   04/20/2020 Tumor Marker   Patient's tumor was tested for the following markers: CA-125 Results of the tumor marker test revealed 9.3   04/24/2020 Imaging   1. Status post interval hysterectomy and  oophorectomy. 2. Interval resolution of previously seen small volume ascites throughout the abdomen and pelvis. There is minimal peritoneal thickening, for example in the right paracolic gutter. 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 3. Stable small pulmonary nodules of the left lung base, measuring 3 mm, most likely incidental and benign. Continued attention on follow-up.     10/24/2020 Tumor Marker   Patient's tumor was tested for the following markers: CA-125. Results of the tumor marker test revealed 9.     PHYSICAL EXAMINATION: ECOG PERFORMANCE STATUS: 0 - Asymptomatic  Vitals:   10/25/20 1457  BP: 139/90  Pulse: 69  Resp: 18  SpO2: 98%   Filed Weights   10/25/20 1457  Weight: 160 lb (72.6 kg)    GENERAL:alert, no distress and comfortable SKIN: skin color, texture, turgor are normal, no rashes or  significant lesions EYES: normal, Conjunctiva are pink and non-injected, sclera clear OROPHARYNX:no exudate, no erythema and lips, buccal mucosa, and tongue normal  NECK: supple, thyroid normal size, non-tender, without nodularity LYMPH:  no palpable lymphadenopathy in the cervical, axillary or inguinal LUNGS: clear to auscultation and percussion with normal breathing effort HEART: regular rate & rhythm and no murmurs and no lower extremity edema ABDOMEN:abdomen soft, non-tender and normal bowel sounds Musculoskeletal:no cyanosis of digits and no clubbing  NEURO: alert & oriented x 3 with fluent speech, no focal motor/sensory deficits  LABORATORY DATA:  I have reviewed the data as listed    Component Value Date/Time   NA 142 10/24/2020 1121   NA 142 02/28/2019 1529   K 4.4 10/24/2020 1121   CL 106 10/24/2020 1121   CO2 26 10/24/2020 1121   GLUCOSE 85 10/24/2020 1121   BUN 17 10/24/2020 1121   BUN 14 02/28/2019 1529   CREATININE 0.85 10/24/2020 1121   CREATININE 0.79 05/28/2019 1156   CALCIUM 10.0 10/24/2020 1121    PROT 7.3 10/24/2020 1121   PROT 6.7 02/28/2019 1529   ALBUMIN 4.5 10/24/2020 1121   ALBUMIN 4.3 02/28/2019 1529   AST 24 10/24/2020 1121   ALT 18 10/24/2020 1121   ALKPHOS 53 10/24/2020 1121   BILITOT 0.6 10/24/2020 1121   GFRNONAA >60 10/24/2020 1121   GFRNONAA 81 05/28/2019 1156   GFRAA 94 05/28/2019 1156    No results found for: SPEP, UPEP  Lab Results  Component Value Date   WBC 4.9 10/24/2020   NEUTROABS 3.2 10/24/2020   HGB 12.2 10/24/2020   HCT 35.0 (L) 10/24/2020   MCV 92.3 10/24/2020   PLT 215 10/24/2020      Chemistry      Component Value Date/Time   NA 142 10/24/2020 1121   NA 142 02/28/2019 1529   K 4.4 10/24/2020 1121   CL 106 10/24/2020 1121   CO2 26 10/24/2020 1121   BUN 17 10/24/2020 1121   BUN 14 02/28/2019 1529   CREATININE 0.85 10/24/2020 1121   CREATININE 0.79 05/28/2019 1156      Component Value Date/Time   CALCIUM 10.0 10/24/2020 1121   ALKPHOS 53 10/24/2020 1121   AST 24 10/24/2020 1121   ALT 18 10/24/2020 1121   BILITOT 0.6 10/24/2020 1121     

## 2020-10-26 ENCOUNTER — Ambulatory Visit: Payer: 59 | Admitting: Hematology and Oncology

## 2020-10-26 ENCOUNTER — Other Ambulatory Visit: Payer: 59

## 2020-10-29 ENCOUNTER — Telehealth: Payer: Self-pay

## 2020-10-29 NOTE — Telephone Encounter (Signed)
Up and about every 1 hour Wear elastic compression She can take 81 mg aspirin with food for a few days

## 2020-10-29 NOTE — Telephone Encounter (Signed)
She called and left a message. Since she is stopping Eliquis.  She will be flying in a couple of weeks, the longest flight is  3 1/2 hours. Do you have suggestions? Should she wear compression stockings?

## 2020-10-30 NOTE — Telephone Encounter (Signed)
Called and given below message. She verbalized understanding. 

## 2020-11-13 ENCOUNTER — Ambulatory Visit (INDEPENDENT_AMBULATORY_CARE_PROVIDER_SITE_OTHER): Payer: 59

## 2020-11-13 ENCOUNTER — Other Ambulatory Visit: Payer: Self-pay

## 2020-11-13 ENCOUNTER — Other Ambulatory Visit: Payer: Self-pay | Admitting: Obstetrics & Gynecology

## 2020-11-13 DIAGNOSIS — Z780 Asymptomatic menopausal state: Secondary | ICD-10-CM

## 2020-11-13 DIAGNOSIS — M85851 Other specified disorders of bone density and structure, right thigh: Secondary | ICD-10-CM | POA: Diagnosis not present

## 2020-11-14 ENCOUNTER — Telehealth: Payer: Self-pay

## 2020-11-14 NOTE — Telephone Encounter (Signed)
Mrs. Lewan called to get some advice and suggestions because she has had a nagging pain under her right rib that almost feels like a "catch".  She does not think she has pulled anything but was instructed to call with anything unusual.  She had a bone density scan yesterday but it is not yet finalized and she saw Dr. Alvy Bimler on 10/25/2020 and everything was good at that appointment.  Her last scan was April 2021.  Routing to Dr. Alvy Bimler for advice. Gardiner Rhyme, RN

## 2020-11-14 NOTE — Telephone Encounter (Signed)
Called and spoke with Mrs. Berkovich and explained to her the Dr. Alvy Bimler stated that this was not consistent with cancer recurrence.  If it worsens and she wants a visit with Dr. Alvy Bimler, she said she would call back to schedule.  She said it is just a dull "weird" pain under her rib and she wanted to make Dr. Alvy Bimler aware. Gardiner Rhyme, RN

## 2020-11-14 NOTE — Telephone Encounter (Signed)
That symptom is not consistent with cancer recurrence, if that is what she is afraid of It can be due to anything If she wants a visit I can see her on Friday morning

## 2020-11-26 DIAGNOSIS — Z86711 Personal history of pulmonary embolism: Secondary | ICD-10-CM | POA: Diagnosis not present

## 2020-11-26 DIAGNOSIS — R1011 Right upper quadrant pain: Secondary | ICD-10-CM | POA: Diagnosis not present

## 2020-11-26 DIAGNOSIS — Z8543 Personal history of malignant neoplasm of ovary: Secondary | ICD-10-CM | POA: Diagnosis not present

## 2020-11-27 ENCOUNTER — Encounter: Payer: Self-pay | Admitting: *Deleted

## 2020-11-27 ENCOUNTER — Other Ambulatory Visit: Payer: Self-pay | Admitting: Internal Medicine

## 2020-11-27 DIAGNOSIS — R1011 Right upper quadrant pain: Secondary | ICD-10-CM

## 2020-11-28 ENCOUNTER — Ambulatory Visit
Admission: RE | Admit: 2020-11-28 | Discharge: 2020-11-28 | Disposition: A | Discharge status: Home / Self Care | Payer: 59 | Source: Ambulatory Visit | Attending: Internal Medicine | Admitting: Internal Medicine

## 2020-11-28 DIAGNOSIS — K7689 Other specified diseases of liver: Secondary | ICD-10-CM | POA: Diagnosis not present

## 2020-11-28 DIAGNOSIS — R109 Unspecified abdominal pain: Secondary | ICD-10-CM | POA: Diagnosis not present

## 2020-11-28 DIAGNOSIS — R1011 Right upper quadrant pain: Secondary | ICD-10-CM

## 2021-01-15 ENCOUNTER — Other Ambulatory Visit (HOSPITAL_COMMUNITY): Payer: Self-pay

## 2021-01-17 DIAGNOSIS — Z23 Encounter for immunization: Secondary | ICD-10-CM | POA: Diagnosis not present

## 2021-01-17 DIAGNOSIS — R238 Other skin changes: Secondary | ICD-10-CM | POA: Diagnosis not present

## 2021-01-17 DIAGNOSIS — D225 Melanocytic nevi of trunk: Secondary | ICD-10-CM | POA: Diagnosis not present

## 2021-01-17 DIAGNOSIS — L814 Other melanin hyperpigmentation: Secondary | ICD-10-CM | POA: Diagnosis not present

## 2021-01-17 DIAGNOSIS — L578 Other skin changes due to chronic exposure to nonionizing radiation: Secondary | ICD-10-CM | POA: Diagnosis not present

## 2021-01-17 DIAGNOSIS — L821 Other seborrheic keratosis: Secondary | ICD-10-CM | POA: Diagnosis not present

## 2021-01-23 ENCOUNTER — Inpatient Hospital Stay: Payer: 59 | Attending: Hematology and Oncology

## 2021-01-23 ENCOUNTER — Other Ambulatory Visit: Payer: Self-pay

## 2021-01-23 DIAGNOSIS — Z86711 Personal history of pulmonary embolism: Secondary | ICD-10-CM | POA: Diagnosis not present

## 2021-01-23 DIAGNOSIS — D61818 Other pancytopenia: Secondary | ICD-10-CM

## 2021-01-23 DIAGNOSIS — R918 Other nonspecific abnormal finding of lung field: Secondary | ICD-10-CM

## 2021-01-23 DIAGNOSIS — C569 Malignant neoplasm of unspecified ovary: Secondary | ICD-10-CM | POA: Insufficient documentation

## 2021-01-23 DIAGNOSIS — I824Z3 Acute embolism and thrombosis of unspecified deep veins of distal lower extremity, bilateral: Secondary | ICD-10-CM

## 2021-01-23 DIAGNOSIS — Z86718 Personal history of other venous thrombosis and embolism: Secondary | ICD-10-CM | POA: Diagnosis not present

## 2021-01-23 DIAGNOSIS — I2699 Other pulmonary embolism without acute cor pulmonale: Secondary | ICD-10-CM

## 2021-01-23 DIAGNOSIS — R180 Malignant ascites: Secondary | ICD-10-CM

## 2021-01-23 LAB — CBC WITH DIFFERENTIAL/PLATELET
Abs Immature Granulocytes: 0.01 10*3/uL (ref 0.00–0.07)
Basophils Absolute: 0 10*3/uL (ref 0.0–0.1)
Basophils Relative: 1 %
Eosinophils Absolute: 0.1 10*3/uL (ref 0.0–0.5)
Eosinophils Relative: 2 %
HCT: 34.9 % — ABNORMAL LOW (ref 36.0–46.0)
Hemoglobin: 12.3 g/dL (ref 12.0–15.0)
Immature Granulocytes: 0 %
Lymphocytes Relative: 30 %
Lymphs Abs: 1.1 10*3/uL (ref 0.7–4.0)
MCH: 32.2 pg (ref 26.0–34.0)
MCHC: 35.2 g/dL (ref 30.0–36.0)
MCV: 91.4 fL (ref 80.0–100.0)
Monocytes Absolute: 0.4 10*3/uL (ref 0.1–1.0)
Monocytes Relative: 10 %
Neutro Abs: 2.1 10*3/uL (ref 1.7–7.7)
Neutrophils Relative %: 57 %
Platelets: 232 10*3/uL (ref 150–400)
RBC: 3.82 MIL/uL — ABNORMAL LOW (ref 3.87–5.11)
RDW: 12.4 % (ref 11.5–15.5)
WBC: 3.7 10*3/uL — ABNORMAL LOW (ref 4.0–10.5)
nRBC: 0 % (ref 0.0–0.2)

## 2021-01-23 LAB — COMPREHENSIVE METABOLIC PANEL
ALT: 15 U/L (ref 0–44)
AST: 21 U/L (ref 15–41)
Albumin: 4.2 g/dL (ref 3.5–5.0)
Alkaline Phosphatase: 58 U/L (ref 38–126)
Anion gap: 6 (ref 5–15)
BUN: 17 mg/dL (ref 8–23)
CO2: 30 mmol/L (ref 22–32)
Calcium: 9.2 mg/dL (ref 8.9–10.3)
Chloride: 102 mmol/L (ref 98–111)
Creatinine, Ser: 0.92 mg/dL (ref 0.44–1.00)
GFR, Estimated: >60 mL/min (ref >60)
Glucose, Bld: 83 mg/dL (ref 70–99)
Potassium: 3.7 mmol/L (ref 3.5–5.1)
Sodium: 138 mmol/L (ref 135–145)
Total Bilirubin: 0.4 mg/dL (ref 0.3–1.2)
Total Protein: 7.1 g/dL (ref 6.5–8.1)

## 2021-01-24 ENCOUNTER — Other Ambulatory Visit: Payer: 59

## 2021-01-24 ENCOUNTER — Encounter: Payer: Self-pay | Admitting: Hematology and Oncology

## 2021-01-24 ENCOUNTER — Inpatient Hospital Stay: Payer: 59 | Admitting: Hematology and Oncology

## 2021-01-24 DIAGNOSIS — Z86718 Personal history of other venous thrombosis and embolism: Secondary | ICD-10-CM

## 2021-01-24 DIAGNOSIS — C569 Malignant neoplasm of unspecified ovary: Secondary | ICD-10-CM | POA: Diagnosis not present

## 2021-01-24 DIAGNOSIS — Z86711 Personal history of pulmonary embolism: Secondary | ICD-10-CM | POA: Diagnosis not present

## 2021-01-24 LAB — CA 125: Cancer Antigen (CA) 125: 8.4 U/mL (ref 0.0–38.1)

## 2021-01-24 NOTE — Assessment & Plan Note (Signed)
Clinically, she has no signs of cancer recurrence I recommend close follow-up with tumor marker monitoring every 3 months The patient is educated to watch out for signs and symptoms of cancer recurrence

## 2021-01-24 NOTE — Assessment & Plan Note (Signed)
She has completed a years worth of anticoagulation therapy When the patient plans to travel, recommend frequent ambulation, low-dose aspirin and the use of elastic compression hose

## 2021-01-24 NOTE — Progress Notes (Signed)
[place] OFFICE PROGRESS NOTE  Patient Care Team: Ginger Organ., MD as PCP - General (Internal Medicine) Awanda Mink Craige Cotta, RN as Oncology Nurse Navigator (Oncology)  ASSESSMENT & PLAN:  Ovarian cancer [facility] 2) Clinically, she has no signs of cancer recurrence I recommend close follow-up with tumor marker monitoring every 3 months The patient is educated to watch out for signs and symptoms of cancer recurrence  Personal history of venous thrombosis and embolism She has completed a years worth of anticoagulation therapy When the patient plans to travel, recommend frequent ambulation, low-dose aspirin and the use of elastic compression hose  No orders of the defined types were placed in this encounter.   All questions were answered. The patient knows to call the clinic with any problems, questions or concerns. The total time spent in the appointment was 20 minutes encounter with patients including review of chart and various tests results, discussions about plan of care and coordination of care plan   Heath Lark, MD 01/24/2021 7:10 PM  INTERVAL HISTORY: Please see below for problem oriented charting. she returns for surveillance follow-up for history of ovarian cancer She is doing well Denies abdominal pain, bloating or changes in bowel habits Denies recent leg swelling, cough, chest pain or shortness of breath  REVIEW OF SYSTEMS:   Constitutional: Denies fevers, chills or abnormal weight loss Eyes: Denies blurriness of vision Ears, nose, mouth, throat, and face: Denies mucositis or sore throat Respiratory: Denies cough, dyspnea or wheezes Cardiovascular: Denies palpitation, chest discomfort or lower extremity swelling Gastrointestinal:  Denies nausea, heartburn or change in bowel habits Skin: Denies abnormal skin rashes Lymphatics: Denies new lymphadenopathy or easy bruising Neurological:Denies numbness, tingling or new weaknesses Behavioral/Psych: Mood is  stable, no new changes  All other systems were reviewed with the patient and are negative.  I have reviewed the past medical history, past surgical history, social history and family history with the patient and they are unchanged from previous note.  ALLERGIES:  has No Known Allergies.  MEDICATIONS:  Current Outpatient Medications  Medication Sig Dispense Refill   Melatonin 10 MG TABS Take 10 mg by mouth at bedtime as needed.     Alpha-Lipoic Acid 300 MG CAPS Take 1-2 capsules (300-600 mg total) by mouth once daily 60 capsule 11   cetirizine (ZYRTEC) 10 MG tablet Chew 10 mg by mouth daily.     Cholecalciferol 50 MCG (2000 UT) CAPS Take 2,000 Units by mouth daily.     gabapentin (NEURONTIN) 300 MG capsule Take 1 capsule (300 mg total) by mouth 3 (three) times daily 270 capsule 1   No current facility-administered medications for this visit.   Facility-Administered Medications Ordered in Other Visits  Medication Dose Route Frequency Provider Last Rate Last Admin   iohexol (OMNIPAQUE) 350 MG/ML injection 100 mL  100 mL Intravenous Once PRN Heath Lark, MD        SUMMARY OF ONCOLOGIC HISTORY: Oncology History Overview Note  Neg Genetics, unable to do HRD   Ovarian cancer ([place])  09/20/2019 Initial Diagnosis   Presented to her primary care doctor with abdominal bloating.  Abdominal x-ray imaging was unremarkable.   09/29/2019 Imaging   CT of the chest showed pulmonary embolism in all lobes.  Large right/central pelvic mass likely ovarian neoplasm with omental/peritoneal metastasis with significant free fluid in the abdomen and pelvis.  A small left uterine calcification could be seen in a degenerating fibroid.  Small right and left lung base nodules, could represent  sequela of previous infection or inflammation with metastasis not excluded.   09/29/2019 - 10/15/2019 Hospital Admission   She presented to the emergency department in [name] after complaints of leg pain with bilateral lower  extremity edema.  D-dimer was elevated.  Lower extremity ultrasound demonstrated extensive bilateral DVT involving peroneal and soleal calf veins on the left and peroneal vein on the right.  CT abdomen and pelvis showed large pelvic mass with omental metastasis and ascites, worrisome for ovarian cancer.  CT imaging of the chest showed bilateral pulmonary nodules as well as evidence of pulmonary embolism.  She received anticoagulation therapy.  She was also found to be anemic, worrisome that she might have bleeding in the tumor and received transfusion.  Echocardiogram showed preserved ejection fraction but evidence of moderate right ventricular strain.  Subsequent biopsy showed cancer suspicious for ovarian primary and she received first cycle of neoadjuvant chemotherapy with carboplatin and taxol. She had IVC filter placement and temporary TPN   09/30/2019 Procedure   Ultrasound paracentesis was performed complicated by bleeding and received blood transfusion   09/30/2019 Procedure   She had IVC filter placement by interventional radiologist in [facility].   09/30/2019 Echocardiogram   Outside echocardiogram showed left ventricular ejection fraction around 79%.  Concentric left ventricular modeling.  Grade 1 diastolic pattern with normal left atrial pressure.  Right ventricle size is mildly enlarged.  Right ventricular systolic function is moderately reduced.  Right ventricle was not well visualized.  Aortic sclerosis without evidence of stenosis.  Moderate pleural effusion on the left region   10/02/2019 Procedure   EGD was performed which showed no evidence of acute bleeding.  Biopsy of the stomach showed benign gastric mucosa with reactive gastropathy as may be seen in the healing phase of erosive gastritis.  Helicobacter organism testing is negative..   10/03/2019 Procedure   She underwent CT-guided biopsy of the pelvic mass.   10/03/2019 Tumor Marker   CEA level was 2.9.   10/03/2019 Pathology Results    Biopsy show adenocarcinoma.  The malignant cells stain positive for keratin AE1/AE3, CK7 and MOC-31.  Rare cells show weak reactivity with p63 and GA TA 3.  Cells are negative for CK20, calretinin, CDX2, TTF-1, ER and WT-1.  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  Possible consideration include, but not limited to, gynecological origin, pancreatico biliary and urothelial.   10/03/2019 Tumor Marker   Patient's tumor was tested for the following markers: CA-125 Results of the tumor marker test revealed 713 CA19-9 is 100   10/04/2019 Imaging   Tagged red blood cell scan equivocal for source of bleeding.   10/05/2019 Procedure   She underwent paracentesis.  IR removed 4100 cc of ascites fluid with significant blood noted.   10/05/2019 Imaging   CT abdomen and pelvis angiogram protocol show no obvious source of bleeding.   10/05/2019 Imaging   CT imaging of the abdomen and pelvis showed no evidence of postprocedural hemorrhage or discrete area of GI bleeding.  Large volume ascites is present.  Small pockets of intraperitoneal free air are present, likely related to recent paracentesis.  Abnormal mass in the pelvic region most likely a neoplasm of uterine or ovarian origin.  Normal liver with benign cysts of the right hepatic dome.  Biliary tree and pancreas and spleen were age-appropriate   10/07/2019 Procedure   Ultrasound paracentesis was performed.   10/07/2019 -  Chemotherapy   The patient had neoadjuvant carboplatin  and taxol for chemotherapy treatment.     10/08/2019 Imaging   CT angiogram of the abdominal aorta and iliofemoral test was done showing 14 x 10 x 14 cm pelvic mass with mild to moderate ascites.  Normal appearance of arterial structures without evidence of compression.  No evidence of compression of the iliac vein or IVC.   10/11/2019 Imaging   Ultrasound shows gallbladder full of sludge.   Fatty liver changes.  Ascites present.   10/18/2019 Cancer Staging   Staging form: Ovary, Fallopian Tube, and Primary Peritoneal Carcinoma, AJCC 8th Edition - Clinical stage from 10/18/2019: FIGO Stage IIIC, calculated as Stage Unknown (cT3c, cNX, cM0) - Signed by Heath Lark, MD on 10/18/2019    10/21/2019 Tumor Marker   Patient's tumor was tested for the following markers: CA-125. Results of the tumor marker test revealed 257.   10/31/2019 - 03/21/2020 Chemotherapy   The patient had carboplatin and taxol for chemotherapy treatment.     11/22/2019 Tumor Marker   Patient's tumor was tested for the following markers: CA-125. Results of the tumor marker test revealed 30.7   12/12/2019 Imaging   1. Today's study demonstrates a positive response to therapy with regression of right ovarian lesion(s), and decreased volume of presumably malignant ascites. 2. No definitive evidence of metastatic disease in the thorax. Multiple tiny 2-4 mm pulmonary nodules are stable compared to prior examinations. Continued attention on follow-up studies is recommended to exclude the possibility of metastatic lesions    12/26/2019 Tumor Marker   Patient's tumor was tested for the following markers: CA-125 Results of the tumor marker test revealed 16.4   01/10/2020 Surgery   Surgeon: Donaciano Eva    Assistants: Dr Lahoma Crocker (an MD assistant was necessary for tissue manipulation, management of robotic instrumentation, retraction and positioning due to the complexity of the case and hospital policies).  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    Post-operative Diagnosis: same   Operation: Robotic-assisted laparoscopic total hysterectomy with bilateral salpingoophorectomy, omentectomy, radical tumor debulking     Operative Findings:  : bulky right ovarian mass (approximately 6cm), mild  nodularity to left ovary, tumor nodules on peritoneum of pelvis (all resected), no gross omental disease. No gross residual tumor at the completion of the procedure representing a complete (optimal) cytoreduction   01/10/2020 Pathology Results   Clinical History: Ovarian cancer ([facility])   FINAL MICROSCOPIC DIAGNOSIS:   A. UTERUS, CERVIX, BILATERAL TUBES AND OVARIES:  - Right ovary:       Microscopic foci of adenocarcinoma associated with extensive necrosis and hemosiderin deposition.       No ovarian surface involvement identified.       Paraovarian necrotic and hemorrhagic nodule.       See oncology table and comment.  - Left Fallopian tube:       Microscopic focus of adenocarcinoma involving submucosa.       See comment.  - Cervix:       Nabothian cyst.       No dysplasia or malignancy.  - Endometrium:       Inactive.       No hyperplasia or malignancy.  - Right Fallopian tube:       Adhesions with focal hemosiderin deposition.       No malignancy identified.  - Left ovary:       Endosalpingosis.       No malignancy identified.  B. PERITONEAL NODULE, BIOPSY:  - Necrotic and hemorrhagic nodule with hemosiderin deposition.  - No viable malignancy identified.   C. OMENTUM:  - Foci of fibrosis associated with adhesions and hemosiderin deposition.  - No malignancy identified.  - One benign lymph node (0/1).   ONCOLOGY TABLE:  OVARY or FALLOPIAN TUBE or PRIMARY PERITONEUM: Resection  Procedure: Hysterectomy with bilateral salpingo-oophorectomy, peritoneal nodule biopsy and omentectomy.  Specimen Integrity: Intact.  Tumor Site: Right ovary.  Tumor Size: 5.2 x 4.5 x 3.8 cm, see comment.  Histologic Type: Endometrioid adenocarcinoma.  Histologic Grade: FIGO grade 2.  Ovarian Surface Involvement: Not identified.  Fallopian Tube Surface Involvement: Not identified.  See comment.  Other Tissue/ Organ Involvement: Not identified.  Peritoneal/Ascitic Fluid Involvement: Not  applicable.  Chemotherapy Response Score (CRS): Near complete response, CRS 3.  See  comment.  Regional Lymph Nodes: No lymph nodes submitted.  Distant Metastasis:       Distant Site(s) Involved: Not identified.  Pathologic Stage Classification (pTNM, AJCC 8th Edition): ypT2a, ypNX  Ancillary Studies: Can be performed if requested.  Representative Tumor Block: A6 and A7.    02/29/2020 Tumor Marker   Patient's tumor was tested for the following markers: CA-125 Results of the tumor marker test revealed 14.2   03/02/2020 Genetic Testing   Negative hereditary cancer genetic testing: no pathogenic variants detected in [facility] CustomNext-Cancer +RNAinsight Panel.  The report date is March 02, 2020.  HRD testing was unable to be performed due to insufficient sample availability.   The CustomNext-Cancer+RNAinsight panel offered by [facility] includes sequencing and rearrangement analysis for the following 47 genes:  APC, ATM, AXIN2, BARD1, BMPR1A, BRCA1, BRCA2, BRIP1, CDH1, CDK4, CDKN2A, CHEK2, DICER1, EPCAM, GREM1, HOXB13, MEN1, MLH1, MSH2, MSH3, MSH6, MUTYH, NBN, NF1, NF2, NTHL1, PALB2, PMS2, POLD1, POLE, PTEN, RAD51C, RAD51D, RECQL, RET, SDHA, SDHAF2, SDHB, SDHC, SDHD, SMAD4, SMARCA4, STK11, TP53, TSC1, TSC2, and VHL.  RNA data is routinely analyzed for use in variant interpretation for all genes.   03/21/2020 Tumor Marker   Patient's tumor was tested for the following markers: CA-125 Results of the tumor marker test revealed 11.5   04/20/2020 Tumor Marker   Patient's tumor was tested for the following markers: CA-125 Results of the tumor marker test revealed 9.3   04/24/2020 Imaging   1. Status post interval hysterectomy and oophorectomy. 2. Interval resolution of previously seen small volume ascites throughout the abdomen and pelvis. There is minimal peritoneal thickening, for example in the right paracolic gutter. Findings are consistent with treatment response of presumed peritoneal  metastatic disease. 3. Stable small pulmonary nodules of the left lung base, measuring 3 mm, most likely incidental and benign. Continued attention on follow-up.     10/24/2020 Tumor Marker   Patient's tumor was tested for the following markers: CA-125. Results of the tumor marker test revealed 9.   01/23/2021 Tumor Marker   Patient's tumor was tested for the following markers: CA-125. Results of the tumor marker test revealed 8.4.     PHYSICAL EXAMINATION: ECOG PERFORMANCE STATUS: 0 - Asymptomatic  Vitals:   01/24/21 1004  BP: (!) 138/91  Pulse: (!) 58  Resp: 18  Temp: 97.6 F (36.4 C)  SpO2: 98%   Filed Weights   01/24/21 1004  Weight: 152 lb 9.6 oz (69.2 kg)    GENERAL:alert, no distress and comfortable SKIN: skin color, texture, turgor are normal, no rashes or significant lesions EYES: normal, Conjunctiva are pink and non-injected, sclera clear OROPHARYNX:no exudate, no erythema and  lips, buccal mucosa, and tongue normal  NECK: supple, thyroid normal size, non-tender, without nodularity LYMPH:  no palpable lymphadenopathy in the cervical, axillary or inguinal LUNGS: clear to auscultation and percussion with normal breathing effort HEART: regular rate & rhythm and no murmurs and no lower extremity edema ABDOMEN:abdomen soft, non-tender and normal bowel sounds Musculoskeletal:no cyanosis of digits and no clubbing  NEURO: alert & oriented x 3 with fluent speech, no focal motor/sensory deficits  LABORATORY DATA:  I have reviewed the data as listed    Component Value Date/Time   NA 138 01/23/2021 1034   NA 142 02/28/2019 1529   K 3.7 01/23/2021 1034   CL 102 01/23/2021 1034   CO2 30 01/23/2021 1034   GLUCOSE 83 01/23/2021 1034   BUN 17 01/23/2021 1034   BUN 14 02/28/2019 1529   CREATININE 0.92 01/23/2021 1034   CREATININE 0.85 10/24/2020 1121   CREATININE 0.79 05/28/2019 1156   CALCIUM 9.2 01/23/2021 1034   PROT 7.1 01/23/2021 1034   PROT 6.7 02/28/2019 1529    ALBUMIN 4.2 01/23/2021 1034   ALBUMIN 4.3 02/28/2019 1529   AST 21 01/23/2021 1034   AST 24 10/24/2020 1121   ALT 15 01/23/2021 1034   ALT 18 10/24/2020 1121   ALKPHOS 58 01/23/2021 1034   BILITOT 0.4 01/23/2021 1034   BILITOT 0.6 10/24/2020 1121   GFRNONAA >60 01/23/2021 1034   GFRNONAA >60 10/24/2020 1121   GFRNONAA 81 05/28/2019 1156   GFRAA 94 05/28/2019 1156    No results found for: SPEP, UPEP  Lab Results  Component Value Date   WBC 3.7 (L) 01/23/2021   NEUTROABS 2.1 01/23/2021   HGB 12.3 01/23/2021   HCT 34.9 (L) 01/23/2021   MCV 91.4 01/23/2021   PLT 232 01/23/2021      Chemistry      Component Value Date/Time   NA 138 01/23/2021 1034   NA 142 02/28/2019 1529   K 3.7 01/23/2021 1034   CL 102 01/23/2021 1034   CO2 30 01/23/2021 1034   BUN 17 01/23/2021 1034   BUN 14 02/28/2019 1529   CREATININE 0.92 01/23/2021 1034   CREATININE 0.85 10/24/2020 1121   CREATININE 0.79 05/28/2019 1156      Component Value Date/Time   CALCIUM 9.2 01/23/2021 1034   ALKPHOS 58 01/23/2021 1034   AST 21 01/23/2021 1034   AST 24 10/24/2020 1121   ALT 15 01/23/2021 1034   ALT 18 10/24/2020 1121   BILITOT 0.4 01/23/2021 1034   BILITOT 0.6 10/24/2020 1121

## 2021-02-06 ENCOUNTER — Encounter: Payer: Self-pay | Admitting: Obstetrics & Gynecology

## 2021-02-06 ENCOUNTER — Ambulatory Visit (INDEPENDENT_AMBULATORY_CARE_PROVIDER_SITE_OTHER): Payer: 59 | Admitting: Obstetrics & Gynecology

## 2021-02-06 ENCOUNTER — Other Ambulatory Visit: Payer: Self-pay

## 2021-02-06 ENCOUNTER — Other Ambulatory Visit (HOSPITAL_COMMUNITY)
Admission: RE | Admit: 2021-02-06 | Discharge: 2021-02-06 | Disposition: A | Discharge status: Home / Self Care | Payer: 59 | Source: Ambulatory Visit | Attending: Obstetrics & Gynecology | Admitting: Obstetrics & Gynecology

## 2021-02-06 VITALS — BP 110/80 | HR 86 | Resp 16 | Ht 63.5 in | Wt 152.0 lb

## 2021-02-06 DIAGNOSIS — Z01419 Encounter for gynecological examination (general) (routine) without abnormal findings: Secondary | ICD-10-CM | POA: Diagnosis not present

## 2021-02-06 DIAGNOSIS — Z1272 Encounter for screening for malignant neoplasm of vagina: Secondary | ICD-10-CM | POA: Diagnosis not present

## 2021-02-06 DIAGNOSIS — Z780 Asymptomatic menopausal state: Secondary | ICD-10-CM | POA: Diagnosis not present

## 2021-02-06 DIAGNOSIS — M85851 Other specified disorders of bone density and structure, right thigh: Secondary | ICD-10-CM

## 2021-02-06 DIAGNOSIS — C561 Malignant neoplasm of right ovary: Secondary | ICD-10-CM | POA: Diagnosis not present

## 2021-02-06 NOTE — Progress Notes (Signed)
[place] 10-29-1960 [id number]   History:    61 y.o. [id number] Married.  Preschool teacher.  Planning a trip to [place], [place] and to [place] (hiking/biking).   RP:  Established patient presenting for annual gyn exam    HPI: Postmenopause, well on no hormone replacement therapy.  H/O Ovarian AdenoCa stage IIIB Dxed in 09/2019 with PE/Rt Ovarian mass.  Had ChemoRx first, then Robotic Total Laparoscopic Hysterectomy/BSO/Staging in 12/2019 with Dr Denman George and Chemotherapy again post surgery.  Ca125 was >700 at Dx and now normal every 3 months followed by Gyn-Onco.  No pelvic pain. Vagina shorter post Hysterectomy, but adjusting with intercourse, using a lubricant.  Urine and bowel movements normal.  Doing Kegel exercises.  Breasts normal.  Mammo Neg 06/2020.  Body mass index improved to 26.5.    Healthy nutrition and physically active.  Enjoys hiking and camping.  BD 11/2020 Osteopenia T-Score -1.4 and Frax normal.  Health labs with family physician. Colonoscopy in 2020         Past medical history,surgical history, family history and social history were all reviewed and documented in the EPIC chart.  Gynecologic History No LMP recorded. Patient has had a hysterectomy.  Obstetric History OB History  Gravida Para Term Preterm AB Living  4 3     1 3   SAB IAB Ectopic Multiple Live Births               # Outcome Date GA Lbr Len/2nd Weight Sex Delivery Anes PTL Lv  4 AB           3 Para           2 Para           1 Para              ROS: A ROS was performed and pertinent positives and negatives are included in the history.  GENERAL: No fevers or chills. HEENT: No change in vision, no earache, sore throat or sinus congestion. NECK: No pain or stiffness. CARDIOVASCULAR: No chest pain or pressure. No palpitations. PULMONARY: No shortness of breath, cough or wheeze. GASTROINTESTINAL: No abdominal pain, nausea, vomiting or diarrhea, melena or bright red blood per rectum. GENITOURINARY: No  urinary frequency, urgency, hesitancy or dysuria. MUSCULOSKELETAL: No joint or muscle pain, no back pain, no recent trauma. DERMATOLOGIC: No rash, no itching, no lesions. ENDOCRINE: No polyuria, polydipsia, no heat or cold intolerance. No recent change in weight. HEMATOLOGICAL: No anemia or easy bruising or bleeding. NEUROLOGIC: No headache, seizures, numbness, tingling or weakness. PSYCHIATRIC: No depression, no loss of interest in normal activity or change in sleep pattern.     Exam:   BP 110/80    Pulse 86    Resp 16    Ht 5' 3.5" (1.613 m)    Wt 152 lb (68.9 kg)    BMI 26.50 kg/m   Body mass index is 26.5 kg/m.  General appearance : Well developed well nourished female. No acute distress HEENT: Eyes: no retinal hemorrhage or exudates,  Neck supple, trachea midline, no carotid bruits, no thyroidmegaly Lungs: Clear to auscultation, no rhonchi or wheezes, or rib retractions  Heart: Regular rate and rhythm, no murmurs or gallops Breast:Examined in sitting and supine position were symmetrical in appearance, no palpable masses or tenderness,  no skin retraction, no nipple inversion, no nipple discharge, no skin discoloration, no axillary or supraclavicular lymphadenopathy Abdomen: no palpable masses or tenderness, no rebound or guarding Extremities: no edema  or skin discoloration or tenderness  Pelvic: Vulva: Normal             Vagina: No gross lesions or discharge.  Pap reflex done.  Cervix/Uterus  Adnexa  Without masses or tenderness  Anus: Normal   Assessment/Plan:  61 y.o. female for annual exam   1. Encounter for Papanicolaou smear of vagina as part of routine gynecological examination Postmenopause, well on no hormone replacement therapy.  H/O Ovarian AdenoCa stage IIIB Dxed in 09/2019 with PE/Rt Ovarian mass.  Had ChemoRx first, then Robotic Total Laparoscopic Hysterectomy/BSO/Staging in 12/2019 with Dr Denman George and Chemotherapy again post surgery.  Ca125 was >700 at Dx and now normal  every 3 months followed by Gyn-Onco.  No pelvic pain. Vagina shorter post Hysterectomy, but adjusting with intercourse, using a lubricant.  Urine and bowel movements normal.  Doing Kegel exercises.  Breasts normal.  Mammo Neg 06/2020.  Body mass index improved to 26.5.    Healthy nutrition and physically active.  Enjoys hiking and camping.  BD 11/2020 Osteopenia T-Score -1.4 and Frax normal.  Health labs with family physician. Colonoscopy in 2020 - Cytology - PAP( [place])  2. Postmenopause Postmenopause, well on no hormone replacement therapy.   3. Osteopenia of neck of right femur BD 11/2020 Osteopenia T-Score -1.4 and Frax normal.  Vit D, Ca++ total 1.5 g/d, continue wt bearing physical activities.  4. Malignant neoplasm of right ovary ([place]) H/O Ovarian AdenoCa stage IIIB Dxed in 09/2019 with PE/Rt Ovarian mass.  Had ChemoRx first, then Robotic Total Laparoscopic Hysterectomy/BSO/Staging in 12/2019 with Dr Denman George and Chemotherapy again post surgery.  Ca125 was >700 at Dx and now normal every 3 months followed by Gyn-Onco.  Other orders - melatonin 5 MG TABS; Take 5 mg by mouth.   [name], 8:23 AM 02/06/2021

## 2021-02-08 LAB — CYTOLOGY - PAP
Adequacy: ABSENT
Comment: NEGATIVE
Diagnosis: NEGATIVE
High risk HPV: NEGATIVE

## 2021-02-26 ENCOUNTER — Other Ambulatory Visit (HOSPITAL_COMMUNITY): Payer: Self-pay

## 2021-02-26 DIAGNOSIS — R051 Acute cough: Secondary | ICD-10-CM | POA: Diagnosis not present

## 2021-02-26 DIAGNOSIS — J069 Acute upper respiratory infection, unspecified: Secondary | ICD-10-CM | POA: Diagnosis not present

## 2021-02-26 DIAGNOSIS — D171 Benign lipomatous neoplasm of skin and subcutaneous tissue of trunk: Secondary | ICD-10-CM | POA: Diagnosis not present

## 2021-02-26 MED ORDER — AZITHROMYCIN 250 MG PO TABS
ORAL_TABLET | ORAL | 0 refills | Status: DC
  Filled 2021-02-26: qty 6, 5d supply, fill #0

## 2021-04-09 DIAGNOSIS — L814 Other melanin hyperpigmentation: Secondary | ICD-10-CM | POA: Diagnosis not present

## 2021-04-09 DIAGNOSIS — K130 Diseases of lips: Secondary | ICD-10-CM | POA: Diagnosis not present

## 2021-04-09 DIAGNOSIS — Z23 Encounter for immunization: Secondary | ICD-10-CM | POA: Diagnosis not present

## 2021-04-12 ENCOUNTER — Other Ambulatory Visit (HOSPITAL_COMMUNITY): Payer: Self-pay

## 2021-04-15 ENCOUNTER — Other Ambulatory Visit (HOSPITAL_COMMUNITY): Payer: Self-pay

## 2021-04-15 MED ORDER — GABAPENTIN 300 MG PO CAPS
300.0000 mg | ORAL_CAPSULE | Freq: Three times a day (TID) | ORAL | 0 refills | Status: DC
  Filled 2021-04-15: qty 270, 90d supply, fill #0

## 2021-04-25 ENCOUNTER — Other Ambulatory Visit: Payer: 59

## 2021-04-26 ENCOUNTER — Ambulatory Visit: Payer: 59 | Admitting: Hematology and Oncology

## 2021-05-07 ENCOUNTER — Other Ambulatory Visit: Payer: Self-pay | Admitting: Obstetrics & Gynecology

## 2021-05-07 DIAGNOSIS — Z1231 Encounter for screening mammogram for malignant neoplasm of breast: Secondary | ICD-10-CM

## 2021-05-13 ENCOUNTER — Inpatient Hospital Stay: Payer: 59 | Attending: Hematology and Oncology

## 2021-05-13 ENCOUNTER — Other Ambulatory Visit: Payer: Self-pay

## 2021-05-13 DIAGNOSIS — C569 Malignant neoplasm of unspecified ovary: Secondary | ICD-10-CM | POA: Diagnosis not present

## 2021-05-13 DIAGNOSIS — G609 Hereditary and idiopathic neuropathy, unspecified: Secondary | ICD-10-CM | POA: Insufficient documentation

## 2021-05-13 DIAGNOSIS — Z86718 Personal history of other venous thrombosis and embolism: Secondary | ICD-10-CM | POA: Insufficient documentation

## 2021-05-13 DIAGNOSIS — D61818 Other pancytopenia: Secondary | ICD-10-CM

## 2021-05-13 DIAGNOSIS — I824Z3 Acute embolism and thrombosis of unspecified deep veins of distal lower extremity, bilateral: Secondary | ICD-10-CM

## 2021-05-13 DIAGNOSIS — Z79899 Other long term (current) drug therapy: Secondary | ICD-10-CM | POA: Insufficient documentation

## 2021-05-13 DIAGNOSIS — I2699 Other pulmonary embolism without acute cor pulmonale: Secondary | ICD-10-CM

## 2021-05-13 DIAGNOSIS — R918 Other nonspecific abnormal finding of lung field: Secondary | ICD-10-CM

## 2021-05-13 DIAGNOSIS — R180 Malignant ascites: Secondary | ICD-10-CM

## 2021-05-13 LAB — CBC WITH DIFFERENTIAL/PLATELET
Abs Immature Granulocytes: 0 10*3/uL (ref 0.00–0.07)
Basophils Absolute: 0 10*3/uL (ref 0.0–0.1)
Basophils Relative: 1 %
Eosinophils Absolute: 0.1 10*3/uL (ref 0.0–0.5)
Eosinophils Relative: 2 %
HCT: 37.6 % (ref 36.0–46.0)
Hemoglobin: 12.4 g/dL (ref 12.0–15.0)
Immature Granulocytes: 0 %
Lymphocytes Relative: 36 %
Lymphs Abs: 1.5 10*3/uL (ref 0.7–4.0)
MCH: 30.7 pg (ref 26.0–34.0)
MCHC: 33 g/dL (ref 30.0–36.0)
MCV: 93.1 fL (ref 80.0–100.0)
Monocytes Absolute: 0.4 10*3/uL (ref 0.1–1.0)
Monocytes Relative: 9 %
Neutro Abs: 2.2 10*3/uL (ref 1.7–7.7)
Neutrophils Relative %: 52 %
Platelets: 258 10*3/uL (ref 150–400)
RBC: 4.04 MIL/uL (ref 3.87–5.11)
RDW: 12.3 % (ref 11.5–15.5)
WBC: 4.2 10*3/uL (ref 4.0–10.5)
nRBC: 0 % (ref 0.0–0.2)

## 2021-05-13 LAB — COMPREHENSIVE METABOLIC PANEL
ALT: 16 U/L (ref 0–44)
AST: 24 U/L (ref 15–41)
Albumin: 4.2 g/dL (ref 3.5–5.0)
Alkaline Phosphatase: 60 U/L (ref 38–126)
Anion gap: 5 (ref 5–15)
BUN: 15 mg/dL (ref 8–23)
CO2: 30 mmol/L (ref 22–32)
Calcium: 9.2 mg/dL (ref 8.9–10.3)
Chloride: 102 mmol/L (ref 98–111)
Creatinine, Ser: 0.95 mg/dL (ref 0.44–1.00)
GFR, Estimated: >60 mL/min (ref >60)
Glucose, Bld: 95 mg/dL (ref 70–99)
Potassium: 3.7 mmol/L (ref 3.5–5.1)
Sodium: 137 mmol/L (ref 135–145)
Total Bilirubin: 0.4 mg/dL (ref 0.3–1.2)
Total Protein: 7.3 g/dL (ref 6.5–8.1)

## 2021-05-14 ENCOUNTER — Inpatient Hospital Stay: Payer: 59 | Admitting: Hematology and Oncology

## 2021-05-14 ENCOUNTER — Other Ambulatory Visit: Payer: 59

## 2021-05-14 ENCOUNTER — Encounter: Payer: Self-pay | Admitting: Hematology and Oncology

## 2021-05-14 VITALS — BP 149/85 | HR 63 | Resp 18 | Ht 63.5 in | Wt 152.4 lb

## 2021-05-14 DIAGNOSIS — G609 Hereditary and idiopathic neuropathy, unspecified: Secondary | ICD-10-CM

## 2021-05-14 DIAGNOSIS — Z86718 Personal history of other venous thrombosis and embolism: Secondary | ICD-10-CM | POA: Diagnosis not present

## 2021-05-14 DIAGNOSIS — Z79899 Other long term (current) drug therapy: Secondary | ICD-10-CM | POA: Diagnosis not present

## 2021-05-14 DIAGNOSIS — C569 Malignant neoplasm of unspecified ovary: Secondary | ICD-10-CM

## 2021-05-14 LAB — CA 125: Cancer Antigen (CA) 125: 9.4 U/mL (ref 0.0–38.1)

## 2021-05-14 NOTE — Assessment & Plan Note (Signed)
She has completed a years worth of anticoagulation therapy ?When the patient plans to travel, recommend frequent ambulation, low-dose aspirin and the use of elastic compression hose ?

## 2021-05-14 NOTE — Progress Notes (Signed)
[facility] ?OFFICE PROGRESS NOTE ? ?Patient Care Team: ?Ginger Organ., MD as PCP - General (Internal Medicine) ?Awanda Mink Craige Cotta, RN as Oncology Nurse Navigator (Oncology) ? ?ASSESSMENT & PLAN:  ?Ovarian cancer ([place]) ?Clinically, she has no signs of cancer recurrence ?Ca1 25 tumor marker monitoring is pending, we will call her with test results ?I recommend close follow-up with tumor marker monitoring every 3 months ?The patient is educated to watch out for signs and symptoms of cancer recurrence ? ?Idiopathic neuropathy ?She has persistent neuropathy that predates treatment ?She will continue gabapentin ? ?Personal history of venous thrombosis and embolism ?She has completed a years worth of anticoagulation therapy ?When the patient plans to travel, recommend frequent ambulation, low-dose aspirin and the use of elastic compression hose ? ?Orders Placed This Encounter  ?Procedures  ? CA 125  ?  Standing Status:   Standing  ?  Number of Occurrences:   11  ?  Standing Expiration Date:   05/15/2022  ? ? ?All questions were answered. The patient knows to call the clinic with any problems, questions or concerns. ?The total time spent in the appointment was 25 minutes encounter with patients including review of chart and various tests results, discussions about plan of care and coordination of care plan ?  ?Heath Lark, MD ?05/14/2021 1:03 PM ? ?INTERVAL HISTORY: ?Please see below for problem oriented charting. ?she returns for surveillance follow-up with her husband ?She is doing well ?She is very active ?She is planning a trip in [place] for 3 weeks, filled with activities such as hiking and biking ?Her peripheral neuropathy is stable ?Denies abdominal pain, bloating or changes in bowel habits ? ?REVIEW OF SYSTEMS:   ?Constitutional: Denies fevers, chills or abnormal weight loss ?Eyes: Denies blurriness of vision ?Ears, nose, mouth, throat, and face: Denies mucositis or sore throat ?Respiratory: Denies  cough, dyspnea or wheezes ?Cardiovascular: Denies palpitation, chest discomfort or lower extremity swelling ?Gastrointestinal:  Denies nausea, heartburn or change in bowel habits ?Skin: Denies abnormal skin rashes ?Lymphatics: Denies new lymphadenopathy or easy bruising ?Neurological:Denies numbness, tingling or new weaknesses ?Behavioral/Psych: Mood is stable, no new changes  ?All other systems were reviewed with the patient and are negative. ? ?I have reviewed the past medical history, past surgical history, social history and family history with the patient and they are unchanged from previous note. ? ?ALLERGIES:  has No Known Allergies. ? ?MEDICATIONS:  ?Current Outpatient Medications  ?Medication Sig Dispense Refill  ? fluticasone (FLONASE) 50 MCG/ACT nasal spray Place 1 spray into both nostrils daily.    ? Alpha-Lipoic Acid 300 MG CAPS Take 1-2 capsules (300-600 mg total) by mouth once daily 60 capsule 11  ? cetirizine (ZYRTEC) 10 MG tablet Chew 10 mg by mouth daily.    ? Cholecalciferol 50 MCG (2000 UT) CAPS Take 2,000 Units by mouth daily.    ? gabapentin (NEURONTIN) 300 MG capsule Take 1 capsule (300 mg total) by mouth 3 (three) times daily 270 capsule 0  ? melatonin 5 MG TABS Take 5 mg by mouth.    ? ?No current facility-administered medications for this visit.  ? ?Facility-Administered Medications Ordered in Other Visits  ?Medication Dose Route Frequency Provider Last Rate Last Admin  ? iohexol (OMNIPAQUE) 350 MG/ML injection 100 mL  100 mL Intravenous Once PRN Heath Lark, MD      ? ? ?SUMMARY OF ONCOLOGIC HISTORY: ?Oncology History Overview Note  ?Neg Genetics, unable to do HRD ?  ?Ovarian cancer (  [place])  ?09/20/2019 Initial Diagnosis  ? Presented to her primary care doctor with abdominal bloating.  Abdominal x-ray imaging was unremarkable. ?  ?09/29/2019 Imaging  ? CT of the chest showed pulmonary embolism in all lobes.  Large right/central pelvic mass likely ovarian neoplasm with omental/peritoneal  metastasis with significant free fluid in the abdomen and pelvis.  A small left uterine calcification could be seen in a degenerating fibroid.  Small right and left lung base nodules, could represent sequela of previous infection or inflammation with metastasis not excluded. ?  ?09/29/2019 - 10/15/2019 Hospital Admission  ? She presented to the emergency department in [name] after complaints of leg pain with bilateral lower extremity edema.  D-dimer was elevated.  Lower extremity ultrasound demonstrated extensive bilateral DVT involving peroneal and soleal calf veins on the left and peroneal vein on the right.  CT abdomen and pelvis showed large pelvic mass with omental metastasis and ascites, worrisome for ovarian cancer.  CT imaging of the chest showed bilateral pulmonary nodules as well as evidence of pulmonary embolism.  She received anticoagulation therapy.  She was also found to be anemic, worrisome that she might have bleeding in the tumor and received transfusion.  Echocardiogram showed preserved ejection fraction but evidence of moderate right ventricular strain.  Subsequent biopsy showed cancer suspicious for ovarian primary and she received first cycle of neoadjuvant chemotherapy with carboplatin and taxol. She had IVC filter placement and temporary TPN ?  ?09/30/2019 Procedure  ? Ultrasound paracentesis was performed complicated by bleeding and received blood transfusion ?  ?09/30/2019 Procedure  ? She had IVC filter placement by interventional radiologist in [name]. ?  ?09/30/2019 Echocardiogram  ? Outside echocardiogram showed left ventricular ejection fraction around 79%.  Concentric left ventricular modeling.  Grade 1 diastolic pattern with normal left atrial pressure.  Right ventricle size is mildly enlarged.  Right ventricular systolic function is moderately reduced.  Right ventricle was not well visualized.  Aortic sclerosis without evidence of stenosis.  Moderate pleural effusion on the left region ?   ?10/02/2019 Procedure  ? EGD was performed which showed no evidence of acute bleeding.  Biopsy of the stomach showed benign gastric mucosa with reactive gastropathy as may be seen in the healing phase of erosive gastritis.  Helicobacter organism testing is negative.. ?  ?10/03/2019 Procedure  ? She underwent CT-guided biopsy of the pelvic mass. ?  ?10/03/2019 Tumor Marker  ? CEA level was 2.9. ?  ?10/03/2019 Pathology Results  ? Biopsy show adenocarcinoma.  The malignant cells stain positive for keratin AE1/AE3, CK7 and MOC-31.  Rare cells show weak reactivity with p63 and GA TA 3.  Cells are negative for CK20, calretinin, CDX2, TTF-1, ER and WT-1.  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  Possible consideration include, but not limited to, gynecological origin, pancreatico biliary and urothelial. ?  ?10/03/2019 Tumor Marker  ? Patient's tumor was tested for the following markers: CA-125 ?Results of the tumor marker test revealed 713 ?CA19-9 is 100 ?  ?10/04/2019 Imaging  ? Tagged red blood cell scan equivocal for source of bleeding. ?  ?10/05/2019 Procedure  ? She underwent paracentesis.  IR removed 4100 cc of ascites fluid with significant blood noted. ?  ?10/05/2019 Imaging  ? CT abdomen and pelvis angiogram protocol show no obvious source of bleeding. ?  ?10/05/2019 Imaging  ? CT imaging of the abdomen and pelvis showed no evidence of postprocedural hemorrhage or discrete area of  GI bleeding.  Large volume ascites is present.  Small pockets of intraperitoneal free air are present, likely related to recent paracentesis.  Abnormal mass in the pelvic region most likely a neoplasm of uterine or ovarian origin.  Normal liver with benign cysts of the right hepatic dome.  Biliary tree and pancreas and spleen were age-appropriate ?  ?10/07/2019 Procedure  ? Ultrasound paracentesis was performed. ?  ?10/07/2019 -  Chemotherapy  ? The patient had  neoadjuvant carboplatin and taxol for chemotherapy treatment.  ? ?  ?10/08/2019 Imaging  ? CT angiogram of the abdominal aorta and iliofemoral test was done showing 14 x 10 x 14 cm pelvic mass with mild to moderate as

## 2021-05-14 NOTE — Assessment & Plan Note (Signed)
Clinically, she has no signs of cancer recurrence ?Ca1 25 tumor marker monitoring is pending, we will call her with test results ?I recommend close follow-up with tumor marker monitoring every 3 months ?The patient is educated to watch out for signs and symptoms of cancer recurrence ?

## 2021-05-14 NOTE — Assessment & Plan Note (Signed)
She has persistent neuropathy that predates treatment ?She will continue gabapentin ?

## 2021-05-16 ENCOUNTER — Telehealth: Payer: Self-pay

## 2021-05-16 NOTE — Telephone Encounter (Signed)
Called and left below message. Ask her to call the office back if needed. 

## 2021-05-16 NOTE — Telephone Encounter (Signed)
-----   Message from Heath Lark, MD sent at 05/16/2021  9:39 AM EDT ----- ?Her CA-125 looks good ? ?

## 2021-05-21 ENCOUNTER — Other Ambulatory Visit: Payer: Self-pay

## 2021-05-21 DIAGNOSIS — C569 Malignant neoplasm of unspecified ovary: Secondary | ICD-10-CM

## 2021-06-13 DIAGNOSIS — G609 Hereditary and idiopathic neuropathy, unspecified: Secondary | ICD-10-CM | POA: Diagnosis not present

## 2021-06-13 DIAGNOSIS — L299 Pruritus, unspecified: Secondary | ICD-10-CM | POA: Diagnosis not present

## 2021-06-13 DIAGNOSIS — R202 Paresthesia of skin: Secondary | ICD-10-CM | POA: Diagnosis not present

## 2021-06-19 DIAGNOSIS — H35373 Puckering of macula, bilateral: Secondary | ICD-10-CM | POA: Diagnosis not present

## 2021-06-21 ENCOUNTER — Ambulatory Visit
Admission: RE | Admit: 2021-06-21 | Discharge: 2021-06-21 | Disposition: A | Discharge status: Home / Self Care | Payer: 59 | Source: Ambulatory Visit | Attending: Obstetrics & Gynecology | Admitting: Obstetrics & Gynecology

## 2021-06-21 DIAGNOSIS — Z1231 Encounter for screening mammogram for malignant neoplasm of breast: Secondary | ICD-10-CM | POA: Diagnosis not present

## 2021-07-08 ENCOUNTER — Other Ambulatory Visit (HOSPITAL_COMMUNITY): Payer: Self-pay

## 2021-07-09 ENCOUNTER — Other Ambulatory Visit (HOSPITAL_COMMUNITY): Payer: Self-pay

## 2021-07-09 MED ORDER — GABAPENTIN 300 MG PO CAPS
300.0000 mg | ORAL_CAPSULE | Freq: Three times a day (TID) | ORAL | 3 refills | Status: DC
  Filled 2021-07-09: qty 270, 90d supply, fill #0
  Filled 2021-10-14: qty 270, 90d supply, fill #1
  Filled 2022-01-09: qty 270, 90d supply, fill #2
  Filled 2022-04-16: qty 270, 90d supply, fill #3

## 2021-08-19 ENCOUNTER — Other Ambulatory Visit: Payer: Self-pay

## 2021-08-19 ENCOUNTER — Inpatient Hospital Stay: Payer: 59 | Attending: Hematology and Oncology

## 2021-08-19 DIAGNOSIS — Z90722 Acquired absence of ovaries, bilateral: Secondary | ICD-10-CM | POA: Insufficient documentation

## 2021-08-19 DIAGNOSIS — K649 Unspecified hemorrhoids: Secondary | ICD-10-CM | POA: Insufficient documentation

## 2021-08-19 DIAGNOSIS — Z9079 Acquired absence of other genital organ(s): Secondary | ICD-10-CM | POA: Insufficient documentation

## 2021-08-19 DIAGNOSIS — Z8543 Personal history of malignant neoplasm of ovary: Secondary | ICD-10-CM | POA: Insufficient documentation

## 2021-08-19 DIAGNOSIS — Z90710 Acquired absence of both cervix and uterus: Secondary | ICD-10-CM | POA: Diagnosis not present

## 2021-08-19 DIAGNOSIS — C569 Malignant neoplasm of unspecified ovary: Secondary | ICD-10-CM

## 2021-08-21 ENCOUNTER — Encounter: Payer: Self-pay | Admitting: Gynecologic Oncology

## 2021-08-21 LAB — CA 125: Cancer Antigen (CA) 125: 9.1 U/mL (ref 0.0–38.1)

## 2021-08-22 ENCOUNTER — Other Ambulatory Visit: Payer: Self-pay

## 2021-08-22 ENCOUNTER — Encounter: Payer: Self-pay | Admitting: Gynecologic Oncology

## 2021-08-22 ENCOUNTER — Inpatient Hospital Stay: Payer: 59 | Admitting: Gynecologic Oncology

## 2021-08-22 VITALS — BP 143/85 | HR 63 | Temp 97.7°F | Resp 16 | Ht 63.0 in | Wt 153.4 lb

## 2021-08-22 DIAGNOSIS — Z8543 Personal history of malignant neoplasm of ovary: Secondary | ICD-10-CM

## 2021-08-22 DIAGNOSIS — Z90710 Acquired absence of both cervix and uterus: Secondary | ICD-10-CM | POA: Diagnosis not present

## 2021-08-22 DIAGNOSIS — Z90722 Acquired absence of ovaries, bilateral: Secondary | ICD-10-CM | POA: Diagnosis not present

## 2021-08-22 DIAGNOSIS — C569 Malignant neoplasm of unspecified ovary: Secondary | ICD-10-CM

## 2021-08-22 DIAGNOSIS — K649 Unspecified hemorrhoids: Secondary | ICD-10-CM | POA: Diagnosis not present

## 2021-08-22 DIAGNOSIS — Z9079 Acquired absence of other genital organ(s): Secondary | ICD-10-CM | POA: Diagnosis not present

## 2021-08-22 NOTE — Patient Instructions (Signed)
It was very nice to meet you today.  I do not see or feel any evidence of cancer recurrence on your exam.  You are scheduled to see Dr. Alvy Bimler back for follow-up in November.  Please call in late December or after the new year to schedule a visit to see me in February.  We will continue with visits every 3 months at this time.  If you develop any new and concerning symptoms before your next visit, please call to see [place] sooner.

## 2021-08-22 NOTE — Progress Notes (Signed)
Gynecologic Oncology Return Clinic Visit  08/22/2021  Reason for Visit: Follow-up in the setting of ovarian cancer  Treatment History: Oncology History Overview Note  Neg Genetics, unable to do HRD   Ovarian cancer ([place])  09/20/2019 Initial Diagnosis   Presented to her primary care doctor with abdominal bloating.  Abdominal x-ray imaging was unremarkable.   09/29/2019 Imaging   CT of the chest showed pulmonary embolism in all lobes.  Large right/central pelvic mass likely ovarian neoplasm with omental/peritoneal metastasis with significant free fluid in the abdomen and pelvis.  A small left uterine calcification could be seen in a degenerating fibroid.  Small right and left lung base nodules, could represent sequela of previous infection or inflammation with metastasis not excluded.   09/29/2019 - 10/15/2019 Hospital Admission   She presented to the emergency department in [name] after complaints of leg pain with bilateral lower extremity edema.  D-dimer was elevated.  Lower extremity ultrasound demonstrated extensive bilateral DVT involving peroneal and soleal calf veins on the left and peroneal vein on the right.  CT abdomen and pelvis showed large pelvic mass with omental metastasis and ascites, worrisome for ovarian cancer.  CT imaging of the chest showed bilateral pulmonary nodules as well as evidence of pulmonary embolism.  She received anticoagulation therapy.  She was also found to be anemic, worrisome that she might have bleeding in the tumor and received transfusion.  Echocardiogram showed preserved ejection fraction but evidence of moderate right ventricular strain.  Subsequent biopsy showed cancer suspicious for ovarian primary and she received first cycle of neoadjuvant chemotherapy with carboplatin and taxol. She had IVC filter placement and temporary TPN   09/30/2019 Procedure   Ultrasound paracentesis was performed complicated by bleeding and received blood transfusion   09/30/2019  Procedure   She had IVC filter placement by interventional radiologist in [facility].   09/30/2019 Echocardiogram   Outside echocardiogram showed left ventricular ejection fraction around 79%.  Concentric left ventricular modeling.  Grade 1 diastolic pattern with normal left atrial pressure.  Right ventricle size is mildly enlarged.  Right ventricular systolic function is moderately reduced.  Right ventricle was not well visualized.  Aortic sclerosis without evidence of stenosis.  Moderate pleural effusion on the left region   10/02/2019 Procedure   EGD was performed which showed no evidence of acute bleeding.  Biopsy of the stomach showed benign gastric mucosa with reactive gastropathy as may be seen in the healing phase of erosive gastritis.  Helicobacter organism testing is negative..   10/03/2019 Procedure   She underwent CT-guided biopsy of the pelvic mass.   10/03/2019 Tumor Marker   CEA level was 2.9.   10/03/2019 Pathology Results   Biopsy show adenocarcinoma.  The malignant cells stain positive for keratin AE1/AE3, CK7 and MOC-31.  Rare cells show weak reactivity with p63 and GA TA 3.  Cells are negative for CK20, calretinin, CDX2, TTF-1, ER and WT-1.  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  Possible consideration include, but not limited to, gynecological origin, pancreatico biliary and urothelial.   10/03/2019 Tumor Marker   Patient's tumor was tested for the following markers: CA-125 Results of the tumor marker test revealed 713 CA19-9 is 100   10/04/2019 Imaging   Tagged red blood cell scan equivocal for source of bleeding.   10/05/2019 Procedure   She underwent paracentesis.  IR removed 4100 cc of ascites fluid with significant blood noted.   10/05/2019 Imaging  CT abdomen and pelvis angiogram protocol show no obvious source of bleeding.   10/05/2019 Imaging   CT imaging of the abdomen and pelvis  showed no evidence of postprocedural hemorrhage or discrete area of GI bleeding.  Large volume ascites is present.  Small pockets of intraperitoneal free air are present, likely related to recent paracentesis.  Abnormal mass in the pelvic region most likely a neoplasm of uterine or ovarian origin.  Normal liver with benign cysts of the right hepatic dome.  Biliary tree and pancreas and spleen were age-appropriate   10/07/2019 Procedure   Ultrasound paracentesis was performed.   10/07/2019 -  Chemotherapy   The patient had neoadjuvant carboplatin and taxol for chemotherapy treatment.     10/08/2019 Imaging   CT angiogram of the abdominal aorta and iliofemoral test was done showing 14 x 10 x 14 cm pelvic mass with mild to moderate ascites.  Normal appearance of arterial structures without evidence of compression.  No evidence of compression of the iliac vein or IVC.   10/11/2019 Imaging   Ultrasound shows gallbladder full of sludge.  Fatty liver changes.  Ascites present.   10/18/2019 Cancer Staging   Staging form: Ovary, Fallopian Tube, and Primary Peritoneal Carcinoma, AJCC 8th Edition - Clinical stage from 10/18/2019: FIGO Stage IIIC, calculated as Stage Unknown (cT3c, cNX, cM0) - Signed by Heath Lark, MD on 10/18/2019   10/21/2019 Tumor Marker   Patient's tumor was tested for the following markers: CA-125. Results of the tumor marker test revealed 257.   10/31/2019 - 03/21/2020 Chemotherapy   The patient had carboplatin and taxol for chemotherapy treatment.     11/22/2019 Tumor Marker   Patient's tumor was tested for the following markers: CA-125. Results of the tumor marker test revealed 30.7   12/12/2019 Imaging   1. Today's study demonstrates a positive response to therapy with regression of right ovarian lesion(s), and decreased volume of presumably malignant ascites. 2. No definitive evidence of metastatic disease in the thorax. Multiple tiny 2-4 mm pulmonary nodules are stable compared  to prior examinations. Continued attention on follow-up studies is recommended to exclude the possibility of metastatic lesions    12/26/2019 Tumor Marker   Patient's tumor was tested for the following markers: CA-125 Results of the tumor marker test revealed 16.4   01/10/2020 Surgery   Surgeon: Donaciano Eva    Assistants: Dr Lahoma Crocker (an MD assistant was necessary for tissue manipulation, management of robotic instrumentation, retraction and positioning due to the complexity of the case and hospital policies).  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    Post-operative Diagnosis: same   Operation: Robotic-assisted laparoscopic total hysterectomy with bilateral salpingoophorectomy, omentectomy, radical tumor debulking     Operative Findings:  : bulky right ovarian mass (approximately 6cm), mild nodularity to left ovary, tumor nodules on peritoneum of pelvis (all resected), no gross omental disease. No gross residual tumor at the completion of the procedure representing a complete (optimal) cytoreduction   01/10/2020 Pathology Results   Clinical History: Ovarian cancer ([facility])   FINAL MICROSCOPIC DIAGNOSIS:   A. UTERUS, CERVIX, BILATERAL TUBES AND OVARIES:  - Right ovary:       Microscopic foci of adenocarcinoma associated with extensive necrosis and hemosiderin deposition.       No ovarian surface involvement identified.       Paraovarian necrotic and hemorrhagic nodule.       See oncology table  and comment.  - Left Fallopian tube:       Microscopic focus of adenocarcinoma involving submucosa.       See comment.  - Cervix:       Nabothian cyst.       No dysplasia or malignancy.  - Endometrium:       Inactive.       No hyperplasia or malignancy.  - Right Fallopian tube:       Adhesions with focal hemosiderin deposition.       No malignancy identified.  - Left  ovary:       Endosalpingosis.       No malignancy identified.   B. PERITONEAL NODULE, BIOPSY:  - Necrotic and hemorrhagic nodule with hemosiderin deposition.  - No viable malignancy identified.   C. OMENTUM:  - Foci of fibrosis associated with adhesions and hemosiderin deposition.  - No malignancy identified.  - One benign lymph node (0/1).   ONCOLOGY TABLE:  OVARY or FALLOPIAN TUBE or PRIMARY PERITONEUM: Resection  Procedure: Hysterectomy with bilateral salpingo-oophorectomy, peritoneal nodule biopsy and omentectomy.  Specimen Integrity: Intact.  Tumor Site: Right ovary.  Tumor Size: 5.2 x 4.5 x 3.8 cm, see comment.  Histologic Type: Endometrioid adenocarcinoma.  Histologic Grade: FIGO grade 2.  Ovarian Surface Involvement: Not identified.  Fallopian Tube Surface Involvement: Not identified.  See comment.  Other Tissue/ Organ Involvement: Not identified.  Peritoneal/Ascitic Fluid Involvement: Not applicable.  Chemotherapy Response Score (CRS): Near complete response, CRS 3.  See  comment.  Regional Lymph Nodes: No lymph nodes submitted.  Distant Metastasis:       Distant Site(s) Involved: Not identified.  Pathologic Stage Classification (pTNM, AJCC 8th Edition): ypT2a, ypNX  Ancillary Studies: Can be performed if requested.  Representative Tumor Block: A6 and A7.    02/29/2020 Tumor Marker   Patient's tumor was tested for the following markers: CA-125 Results of the tumor marker test revealed 14.2   03/02/2020 Genetic Testing   Negative hereditary cancer genetic testing: no pathogenic variants detected in [facility] CustomNext-Cancer +RNAinsight Panel.  The report date is March 02, 2020.  HRD testing was unable to be performed due to insufficient sample availability.   The CustomNext-Cancer+RNAinsight panel offered by [facility] includes sequencing and rearrangement analysis for the following 47 genes:  APC, ATM, AXIN2, BARD1, BMPR1A, BRCA1, BRCA2, BRIP1, CDH1, CDK4, CDKN2A,  CHEK2, DICER1, EPCAM, GREM1, HOXB13, MEN1, MLH1, MSH2, MSH3, MSH6, MUTYH, NBN, NF1, NF2, NTHL1, PALB2, PMS2, POLD1, POLE, PTEN, RAD51C, RAD51D, RECQL, RET, SDHA, SDHAF2, SDHB, SDHC, SDHD, SMAD4, SMARCA4, STK11, TP53, TSC1, TSC2, and VHL.  RNA data is routinely analyzed for use in variant interpretation for all genes.   03/21/2020 Tumor Marker   Patient's tumor was tested for the following markers: CA-125 Results of the tumor marker test revealed 11.5   04/20/2020 Tumor Marker   Patient's tumor was tested for the following markers: CA-125 Results of the tumor marker test revealed 9.3   04/24/2020 Imaging   1. Status post interval hysterectomy and oophorectomy. 2. Interval resolution of previously seen small volume ascites throughout the abdomen and pelvis. There is minimal peritoneal thickening, for example in the right paracolic gutter. 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 3. Stable small pulmonary nodules of the left lung base, measuring 3 mm, most likely incidental and benign. Continued attention on follow-up.     10/24/2020 Tumor Marker   Patient's tumor was tested for the following markers: CA-125. Results of the tumor marker  test revealed 9.   01/23/2021 Tumor Marker   Patient's tumor was tested for the following markers: CA-125. Results of the tumor marker test revealed 8.4.   05/16/2021 Tumor Marker   Patient's tumor was tested for the following markers: CA-125. Results of the tumor marker test revealed 9.4.    The patient reported feeling vague symptoms of abdominal bloating and early satiety in late August and early September 2021.  She attributed this to some decreased activity due to her recent fractured toe.  She mentioned to her primary care physician who performed a pelvic exam which was unremarkable at that time and ordered a plain abdominal x-ray which showed stool burden but no other changes.  The patient subsequently  departed for a planned trip to [name].  While out camping in [place] she developed severe shortness of breath and progressive abdominal distention and discomfort.  This caught her camping trip short and she was seen by local emergency department in [name] where she was admitted and CT scans were performed.  The CT scans showed pulmonary embolisms in all lobes.  A large right central pelvic mass which is likely an ovarian neoplasm measuring at least 11 cm.  There were omental and peritoneal metastases with significant ascites.  A small left uterine calcification consistent with a fibroid was seen.  There was small right and left lung base nodules which could represent sequelae of previous infection or possible metastases.   She was admitted to a hospital in [name] and an IVC filter was placed on September 30, 2019. Tumor markers on October 03, 2019 included a Ca1 25 which was elevated at 713.  CA 19-9 was elevated to 100.  CEA was normal at 2.9. Paracentesis was performed on September 30, 2019 and showed adenocarcinoma. CT-guided biopsy of the pelvic mass on October 03, 2019 showed adenocarcinoma with malignant stains positive for keratin, CK7 and MOC-31.  There were negative for CK20 calretinin CDX2, ER, and WT 1.  The findings were consistent with a poorly differentiated adenocarcinoma of possible gynecologic primary.  Pancreas biliary urothelial primaries could also be considered.   She received her first cycle of carboplatin and paclitaxel as an inpatient in [name] on October 23, 2019.  She had been started on Eliquis for the DVT and PE.  An IVC filter had been placed as stated above.   She was able to return to [place] from [place] after her 1st cycle of chemotherapy and established care with Dr Denman George and Dr Alvy Bimler from [facility].   She completed her 4th cycle of chemotherapy with carboplatin and paclitaxel on 12/13/19.  CA 125 on  11/16/19, which was day 1 of cycle 3, was normal at 30.   CT chest, abdomen and pelvis on 12/12/19 showed a solid mass in the right ovary measuring 4.2 x 4.2 x 3.3 cm. Uterus and left ovary are unremarkable in appearance. Posteriorly to this there was a 4.9 x 4.1 x 4.6 cm macrolobulated cystic lesion with multiple thick internal septations. Overall, the combined size of these lesions or this lesion appears decreased compared to the prior examination from 10/05/2019, indicating a positive response to therapy.No new lesions were seen. There was reduction in ascites present.   It was determined that she had demonstrated good partial response to 4 cycles of neoadjuvant chemotherapy, a 3 month time period had elapsed since her PE, and was then a good candidate for interval surgical cytoreduction.  On 01/10/20 she underwent robotic assisted total hysterectomy, BSO, omentectomy. Intraoperative findings were significant for a bulky right ovarian mass measuring approximately 6 cm, mild nodularity to the left ovary, tumor nodules on the peritoneum of the pelvis which were all resected, no gross omental disease.  No gross residual tumor at the completion of the procedure representing complete, optimal cytoreduction. Surgery was uncomplicated.  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  The focus of cancer that was residual measured 2 mm in greatest dimension.  There was a 1 mm focus of metastatic cancer in the left fallopian tube.  All other foci appeared necrotic with hemorrhagic nodules with no viable tissue remaining.  Chemotherapy response score was 3 with near complete response.   BRCA negative on genetics. CA 125 revealed normalization at the completion of therapy (14.2 on 02/29/20).  Interval History: Saw Dr. Alvy Bimler last in May.  Was doing well at that time.  Had a mammogram in June.  Doing well.  Traveled to [place] this summer with her husband.  He recently  retired from [employer] where he worked in Engineer, technical sales.  She denies any vaginal bleeding or discharge.  Denies any abdominal or pelvic pain.  Reports baseline bowel bladder function.  Past Medical/Surgical History: Past Medical History:  Diagnosis Date   Allergy    Anemia    Broken foot 2022   left   Cancer ([facility]CC)    ovarian   DVT (deep venous thrombosis) ([facility]CC)    Heart murmur    benign never an issue   Numbness and tingling    Osteopenia 08/2016   T score -1.6 FRAX 7.8%/0.7%   Peripheral neuropathy, idiopathic    Pulmonary embolism ([facility])     Past Surgical History:  Procedure Laterality Date   fibroid in 2011,2003     IR IVC FILTER RETRIEVAL / S&I /IMG GUID/MOD SED  04/26/2020   IR RADIOLOGIST EVAL & MGMT  02/10/2020   miscarriage 1989     ovarian cyst 1992     Robotic-assisted laparoscopic total hysterectomy with bilateral salpingoophorectomy, omentectomy, radical tumor debulking   01/10/2020    Family History  Problem Relation Age of Onset   Leukemia Mother 65   Arthritis Father    Colon cancer Father 94   Prostate cancer Father 54   Breast cancer Maternal Grandmother 50   Leukemia Maternal Grandfather 34   Colon cancer Paternal Uncle 64   Colon cancer Cousin 34       paternal female cousin   Throat cancer Cousin 66       paternal female cousin   Prostate cancer Cousin        paternal cousin; dx early 86s; metastatic   Esophageal cancer Neg Hx    Rectal cancer Neg Hx    Stomach cancer Neg Hx     Social History   Socioeconomic History   Marital status: Married    Spouse name: [name]   Number of children: 3   Years of education: [employer] education level: Not on file  Occupational History   Occupation: PRESCHOOL TEACHER    Employer: [employer]  Tobacco Use   Smoking status: Never   Smokeless tobacco: Never  Vaping Use   Vaping Use: Never used  Substance and Sexual Activity   Alcohol use: Yes    Comment: 3 a week   Drug use: No   Sexual activity: Yes     Partners: Male    Birth control/protection:  Post-menopausal, Surgical    Comment: hysterectomy  Other Topics Concern   Not on file  Social History Narrative   Patient is married [name]) and lives at home with her husband. Married x 36 years.   Patient has three adult children; 2 in [place] (30, 72); one in [place] (25).  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   Patient has a college education.   Patient is right-handed.   Patient drinks one cup of coffee daily.   Pillates three times per week for sixteen years.     Social Determinants of Health   Financial Resource Strain: Not on file  Food Insecurity: Not on file  Transportation Needs: Not on file  Physical Activity: Not on file  Stress: Not on file  Social Connections: Not on file    Current Medications:  Current Outpatient Medications:    Alpha-Lipoic Acid 300 MG CAPS, Take 1-2 capsules (300-600 mg total) by mouth once daily, Disp: 60 capsule, Rfl: 11   cetirizine (ZYRTEC) 10 MG tablet, Chew 10 mg by mouth daily., Disp: , Rfl:    Cholecalciferol 50 MCG (2000 UT) CAPS, Take 2,000 Units by mouth daily., Disp: , Rfl:    cyanocobalamin (VITAMIN B12) 1000 MCG tablet, Take 1,000 mcg by mouth daily., Disp: , Rfl:    fluticasone (FLONASE) 50 MCG/ACT nasal spray, Place 1 spray into both nostrils daily., Disp: , Rfl:    gabapentin (NEURONTIN) 300 MG capsule, Take 1 capsule (300 mg total) by mouth 3 (three) times daily, Disp: 270 capsule, Rfl: 3   melatonin 5 MG TABS, Take 5 mg by mouth., Disp: , Rfl:  No current facility-administered medications for this visit.  Facility-Administered Medications Ordered in Other Visits:    iohexol (OMNIPAQUE) 350 MG/ML injection 100 mL, 100 mL, Intravenous, Once PRN, Alvy Bimler, Ni, MD  Review of Systems: Denies appetite changes, fevers, chills, fatigue, unexplained weight changes. Denies hearing loss, neck lumps or masses, mouth  sores, ringing in ears or voice changes. Denies cough or wheezing.  Denies shortness of breath. Denies chest pain or palpitations. Denies leg swelling. Denies abdominal distention, pain, blood in stools, constipation, diarrhea, nausea, vomiting, or early satiety. Denies pain with intercourse, dysuria, frequency, hematuria or incontinence. Denies hot flashes, pelvic pain, vaginal bleeding or vaginal discharge.   Denies joint pain, back pain or muscle pain/cramps. Denies itching, rash, or wounds. Denies dizziness, headaches, numbness or seizures. Denies swollen lymph nodes or glands, denies easy bruising or bleeding. Denies anxiety, depression, confusion, or decreased concentration.  Physical Exam: BP (!) 143/85 (BP Location: Right Arm, Patient Position: Sitting)   Pulse 63   Temp 97.7 F (36.5 C) (Oral)   Resp 16   Ht '5\' 3"'  (1.6 m)   Wt 153 lb 6.4 oz (69.6 kg)   SpO2 96%   BMI 27.17 kg/m  General: Alert, oriented, no acute distress. HEENT: Normocephalic, atraumatic, sclera anicteric. Chest: Clear to auscultation bilaterally.  No wheezes or rhonchi. Cardiovascular: Regular rate and rhythm, no murmurs. Abdomen: soft, nontender.  Normoactive bowel sounds.  No masses or hepatosplenomegaly appreciated.  Extremities: Grossly normal range of motion.  Warm, well perfused.  No edema bilaterally. Skin: No rashes or lesions noted. Lymphatics: No cervical, supraclavicular, or inguinal adenopathy. GU: Normal appearing external genitalia without erythema, excoriation, or lesions.  Speculum exam reveals mildly atrophic vaginal mucosa, cuff intact, no lesions or masses visible.  Bimanual exam reveals cuff intact, no nodularity or masses.  Rectovaginal exam confirms findings.  Laboratory & Radiologic Studies: Component Ref Range & Units 3 d ago (08/19/21) 3 mo ago (05/13/21) 7 mo ago (01/23/21) 10 mo ago (10/24/20) 1 yr ago (08/07/20) 1 yr ago (04/20/20) 1 yr ago (03/21/20)  Cancer Antigen (CA) 125 0.0  - 38.1 U/mL 9.1  9.4 CM  8.4 CM  9.0 CM  8.1 CM  9.3 CM  11.5 CM     Assessment & Plan: Kristina Flores is a 61 y.o. 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 s/p neoadjuvant chemotherapy, interval cytoreduction (complete) on 01/10/20, adjuvant carboplatin and paclitaxel completed on 03/21/20.   Doing well.  NED on exam today.  Discussed intermittently symptomatic hemorrhoids and that she can use over-the-counter topical cream for these.  Patient is aware of her recent tumor marker which was normal.   Per NCCN surveillance recommendations, will continue with visits every 3 months until at least 2 years out from completion of adjuvant therapy, which will be in March 2024.  At that time, can consider transitioning to visits every 4-6 months.  Reviewed signs and symptoms that would be concerning for cancer recurrence and stressed the importance of calling if she develops any of these.  20 minutes of total time was spent for this patient encounter, including preparation, face-to-face counseling with the patient and coordination of care, and documentation of the encounter.  Jeral Pinch, MD  Division of Gynecologic Oncology  Department of Obstetrics and Gynecology  [facility]

## 2021-08-28 DIAGNOSIS — Z0000 Encounter for general adult medical examination without abnormal findings: Secondary | ICD-10-CM | POA: Diagnosis not present

## 2021-08-28 DIAGNOSIS — Z760 Encounter for issue of repeat prescription: Secondary | ICD-10-CM | POA: Diagnosis not present

## 2021-08-28 DIAGNOSIS — M8580 Other specified disorders of bone density and structure, unspecified site: Secondary | ICD-10-CM | POA: Diagnosis not present

## 2021-08-28 DIAGNOSIS — R7989 Other specified abnormal findings of blood chemistry: Secondary | ICD-10-CM | POA: Diagnosis not present

## 2021-09-04 DIAGNOSIS — M8580 Other specified disorders of bone density and structure, unspecified site: Secondary | ICD-10-CM | POA: Diagnosis not present

## 2021-09-04 DIAGNOSIS — R82998 Other abnormal findings in urine: Secondary | ICD-10-CM | POA: Diagnosis not present

## 2021-09-04 DIAGNOSIS — Z8543 Personal history of malignant neoplasm of ovary: Secondary | ICD-10-CM | POA: Diagnosis not present

## 2021-09-04 DIAGNOSIS — R030 Elevated blood-pressure reading, without diagnosis of hypertension: Secondary | ICD-10-CM | POA: Diagnosis not present

## 2021-09-04 DIAGNOSIS — Z0000 Encounter for general adult medical examination without abnormal findings: Secondary | ICD-10-CM | POA: Diagnosis not present

## 2021-09-04 DIAGNOSIS — Z1331 Encounter for screening for depression: Secondary | ICD-10-CM | POA: Diagnosis not present

## 2021-09-04 DIAGNOSIS — J309 Allergic rhinitis, unspecified: Secondary | ICD-10-CM | POA: Diagnosis not present

## 2021-09-04 DIAGNOSIS — G629 Polyneuropathy, unspecified: Secondary | ICD-10-CM | POA: Diagnosis not present

## 2021-09-04 DIAGNOSIS — Z86711 Personal history of pulmonary embolism: Secondary | ICD-10-CM | POA: Diagnosis not present

## 2021-09-04 DIAGNOSIS — D171 Benign lipomatous neoplasm of skin and subcutaneous tissue of trunk: Secondary | ICD-10-CM | POA: Diagnosis not present

## 2021-09-04 DIAGNOSIS — R42 Dizziness and giddiness: Secondary | ICD-10-CM | POA: Diagnosis not present

## 2021-09-23 ENCOUNTER — Ambulatory Visit
Admission: RE | Admit: 2021-09-23 | Discharge: 2021-09-23 | Disposition: A | Discharge status: Home / Self Care | Payer: 59 | Source: Ambulatory Visit | Attending: Sports Medicine | Admitting: Sports Medicine

## 2021-09-23 ENCOUNTER — Other Ambulatory Visit: Payer: Self-pay | Admitting: *Deleted

## 2021-09-23 DIAGNOSIS — M79671 Pain in right foot: Secondary | ICD-10-CM | POA: Diagnosis not present

## 2021-09-23 DIAGNOSIS — M79674 Pain in right toe(s): Secondary | ICD-10-CM

## 2021-09-24 ENCOUNTER — Ambulatory Visit: Payer: 59 | Admitting: Sports Medicine

## 2021-09-24 VITALS — BP 110/74 | Ht 64.0 in | Wt 151.0 lb

## 2021-09-24 DIAGNOSIS — S92504A Nondisplaced unspecified fracture of right lesser toe(s), initial encounter for closed fracture: Secondary | ICD-10-CM

## 2021-09-24 NOTE — Progress Notes (Signed)
PCP: Ginger Organ., MD  Subjective:   HPI: Patient is a 61 y.o. female here for right 5th toe injury.  Patient accidentally kicked the edge of a couch 2 weeks ago. Had immediate pain in her right pinky toe. The following day developed swelling and bruising. Initially thought she just had a bad bruise. Contacted our office yesterday after pain and swelling were persistent. X-rays were ordered at that time.  Patient saw the x-ray report in [place] showing 5th toe fracture. She is wondering what she should and shouldn't be doing in terms of activity.   Past Medical History:  Diagnosis Date   Allergy    Anemia    Broken foot 2022   left   Cancer ([place])    ovarian   DVT (deep venous thrombosis) (HCC)    Heart murmur    benign never an issue   Numbness and tingling    Osteopenia 08/2016   T score -1.6 FRAX 7.8%/0.7%   Peripheral neuropathy, idiopathic    Pulmonary embolism (HCC)     Current Outpatient Medications on File Prior to Visit  Medication Sig Dispense Refill   Alpha-Lipoic Acid 300 MG CAPS Take 1-2 capsules (300-600 mg total) by mouth once daily 60 capsule 11   cetirizine (ZYRTEC) 10 MG tablet Chew 10 mg by mouth daily.     Cholecalciferol 50 MCG (2000 UT) CAPS Take 2,000 Units by mouth daily.     cyanocobalamin (VITAMIN B12) 1000 MCG tablet Take 1,000 mcg by mouth daily.     fluticasone (FLONASE) 50 MCG/ACT nasal spray Place 1 spray into both nostrils daily.     gabapentin (NEURONTIN) 300 MG capsule Take 1 capsule (300 mg total) by mouth 3 (three) times daily 270 capsule 3   melatonin 5 MG TABS Take 5 mg by mouth.     Current Facility-Administered Medications on File Prior to Visit  Medication Dose Route Frequency Provider Last Rate Last Admin   iohexol (OMNIPAQUE) 350 MG/ML injection 100 mL  100 mL Intravenous Once PRN Heath Lark, MD        Past Surgical History:  Procedure Laterality Date   fibroid in 2011,2003     IR IVC FILTER RETRIEVAL / S&I [facility]els  GUID/MOD SED  04/26/2020   IR RADIOLOGIST EVAL & MGMT  02/10/2020   miscarriage 1989     ovarian cyst 1992     Robotic-assisted laparoscopic total hysterectomy with bilateral salpingoophorectomy, omentectomy, radical tumor debulking   01/10/2020    No Known Allergies  BP 110/74   Ht '5\' 4"'$  (1.626 m)   Wt 151 lb (68.5 kg)   BMI 25.92 kg/m      09/04/2020    8:56 AM  [place] Adult Exercise  Frequency of aerobic exercise (# of days/week) 4  Average time in minutes 45  Frequency of strengthening activities (# of days/week) 4        No data to display              Objective:  Physical Exam:  Gen: NAD, comfortable in exam room Right foot: swelling throughout right pinky toe, mild resolving ecchymosis at base of 4th and 5th toes. TTP throughout distal pinky toe. No TTP elsewhere in the foot. FROM. Normal gait.    Assessment & Plan:  1. Non-displaced proximal phalanx fracture of right 5th toe: Discussed approximate timeline for healing. Recommended continuing normal activity as tolerated with pain as a guide. Buddy tape for comfort as needed. Can  continue prn NSAIDs. Return if worsening or no improvement.   Alcus Dad, MD PGY-3, [facility]  Patient seen and evaluated with the resident.  I agree with the above plan of care.  Treatment as above for her nondisplaced fifth toe fracture.  Follow-up as needed.

## 2021-10-14 ENCOUNTER — Other Ambulatory Visit (HOSPITAL_COMMUNITY): Payer: Self-pay

## 2021-11-05 DIAGNOSIS — Z23 Encounter for immunization: Secondary | ICD-10-CM | POA: Diagnosis not present

## 2021-11-08 IMAGING — DX DG FOOT COMPLETE 3+V*L*
3 series · 3 of 3 positions shown · non-contrast
Comparison: 08/13/2019

CLINICAL DATA: Lateral foot pain and swelling after stepping on a
gum ball.

EXAM:
LEFT FOOT - COMPLETE 3+ VIEW

[foot ap]
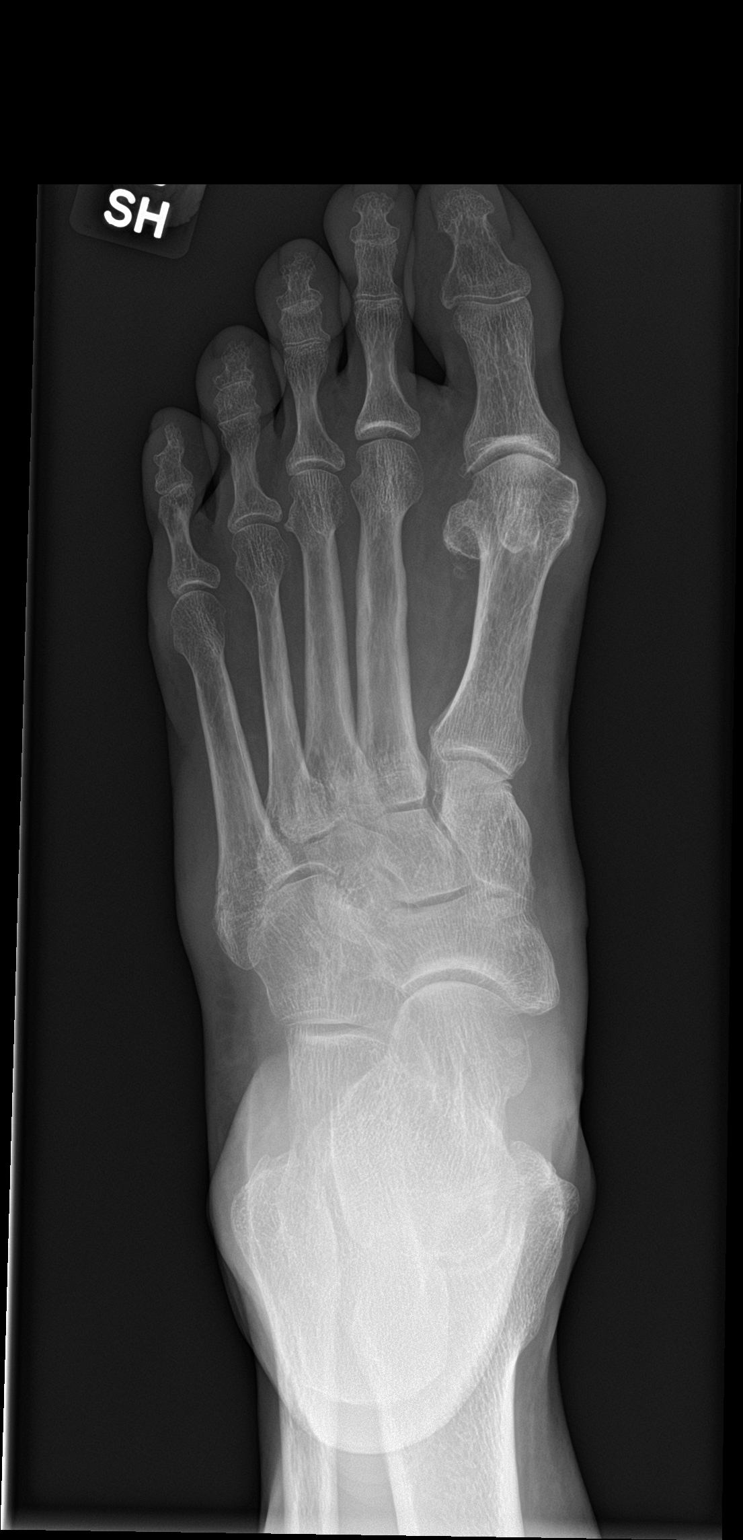

[foot obl]
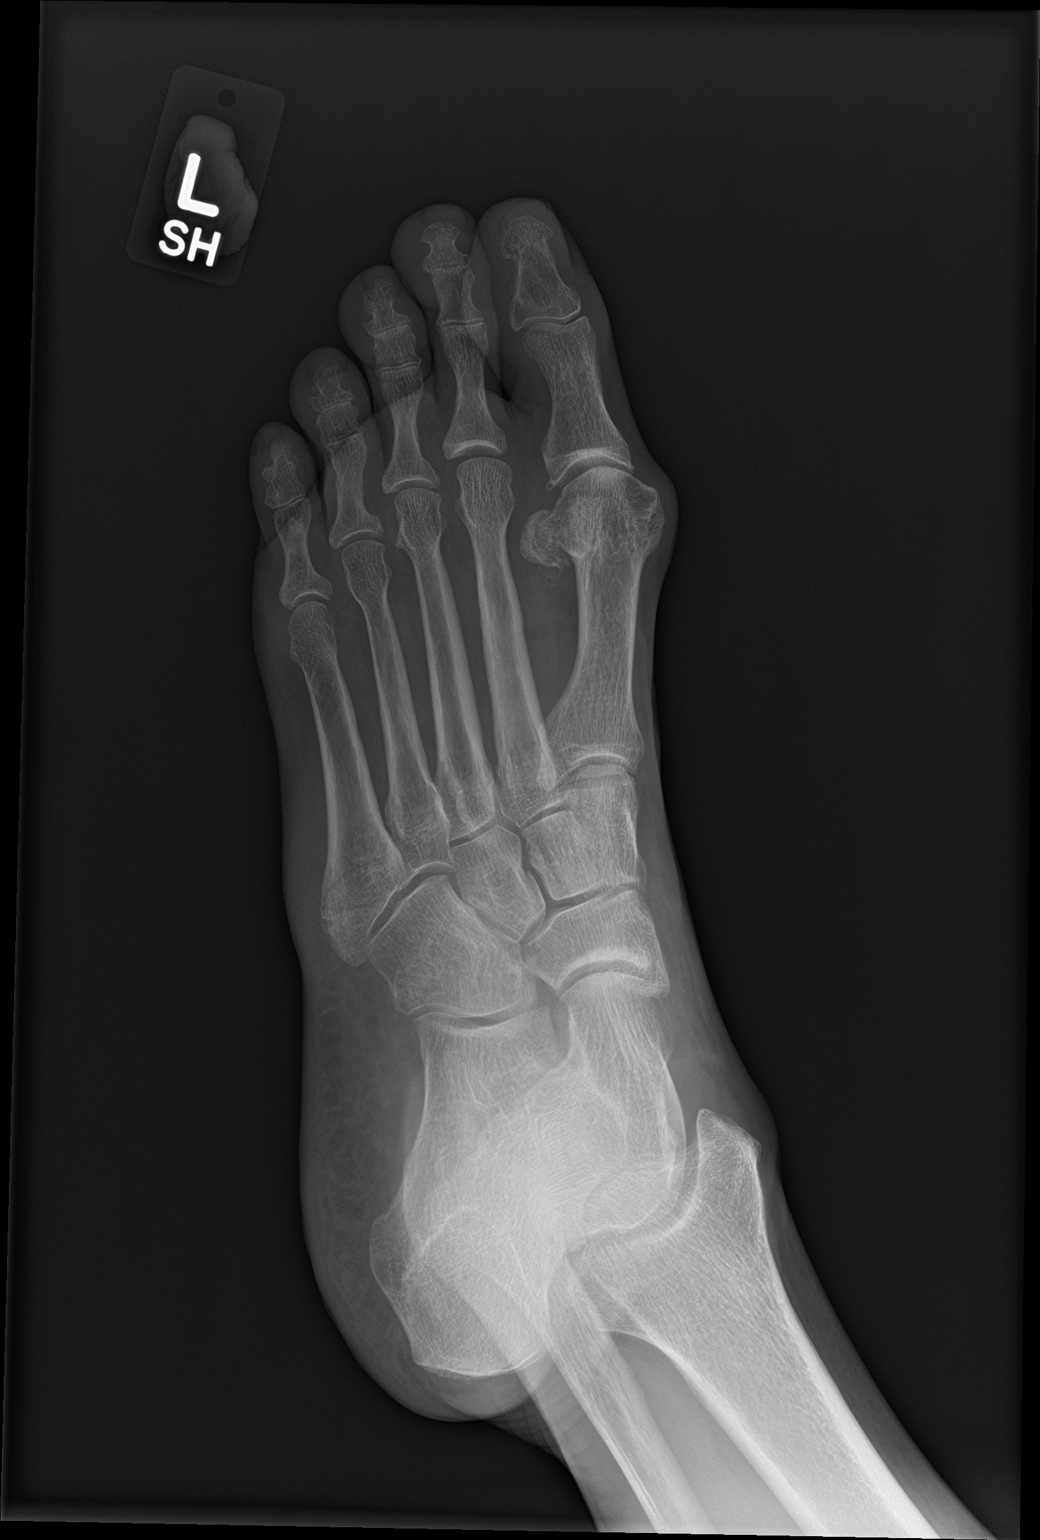

[foot lat]
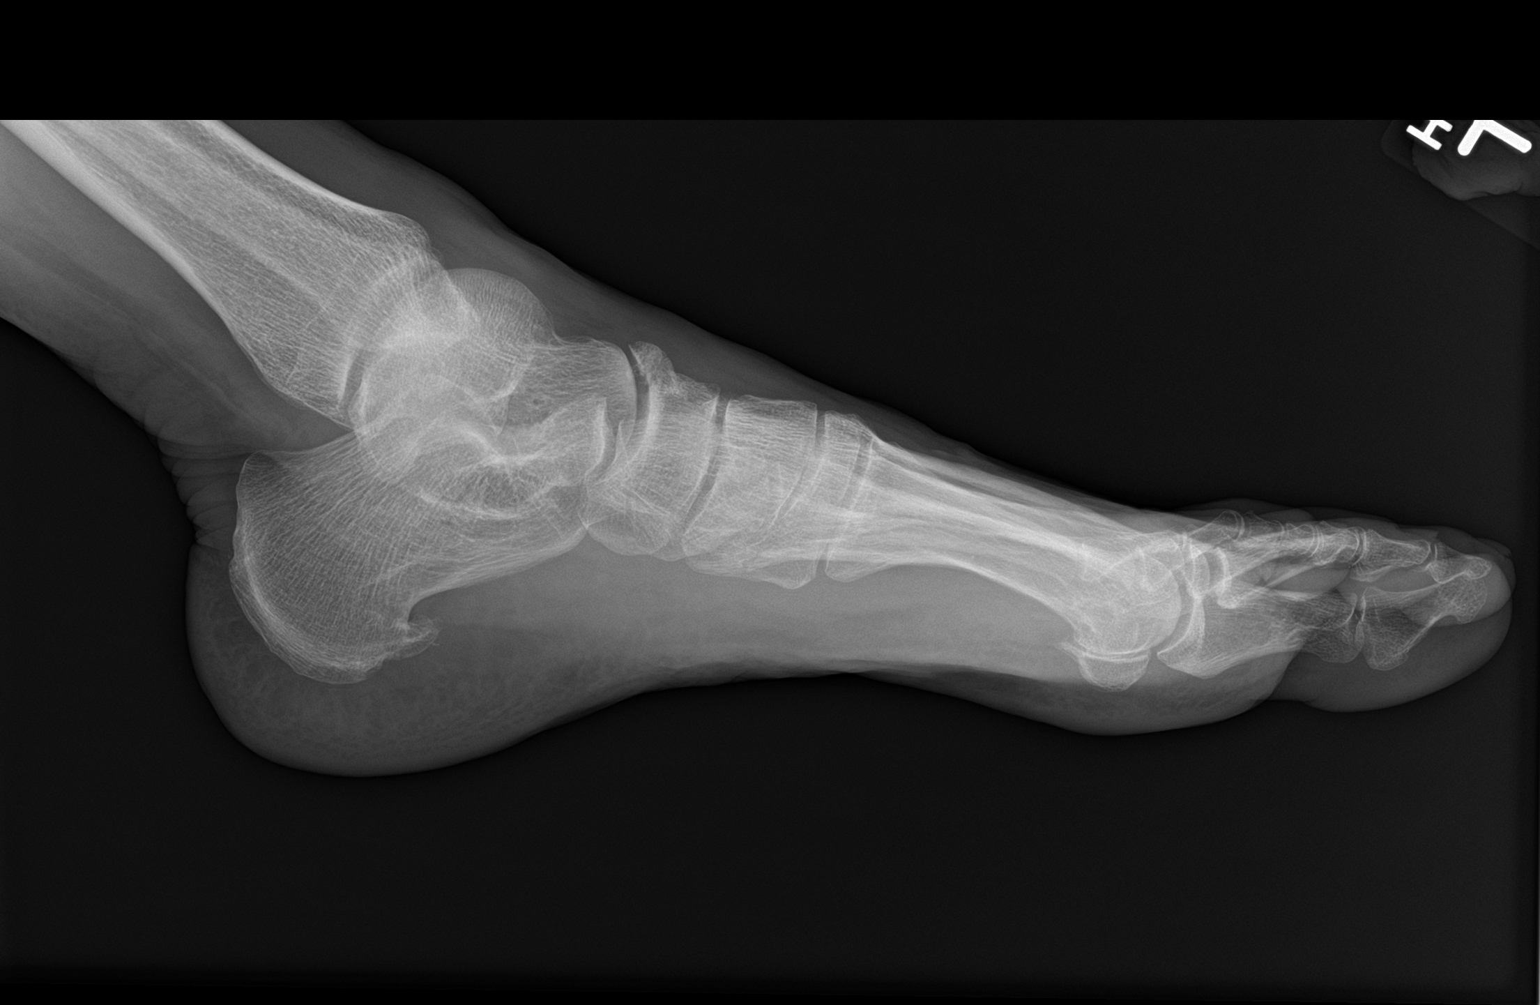

[3 of 3 positions shown; findings below may reference images not displayed]

FINDINGS: The fracture of the proximal phalanx of the fourth toe on the prior
study has healed in the interim. There is an acute, nondisplaced,
intra-articular fracture of the base of the fifth metatarsal with
overlying soft tissue swelling. There is no dislocation. Mild
marginal spurring at the first MTP joint is unchanged. Moderate
dorsal talonavicular spurring and a plantar calcaneal enthesophyte
are also unchanged.
IMPRESSION: Nondisplaced fracture of the base of the fifth metatarsal.

## 2021-11-13 ENCOUNTER — Inpatient Hospital Stay: Payer: 59 | Attending: Hematology and Oncology

## 2021-11-13 ENCOUNTER — Other Ambulatory Visit: Payer: Self-pay

## 2021-11-13 DIAGNOSIS — C569 Malignant neoplasm of unspecified ovary: Secondary | ICD-10-CM

## 2021-11-13 DIAGNOSIS — R911 Solitary pulmonary nodule: Secondary | ICD-10-CM | POA: Insufficient documentation

## 2021-11-13 DIAGNOSIS — R918 Other nonspecific abnormal finding of lung field: Secondary | ICD-10-CM

## 2021-11-13 DIAGNOSIS — D61818 Other pancytopenia: Secondary | ICD-10-CM

## 2021-11-13 DIAGNOSIS — G609 Hereditary and idiopathic neuropathy, unspecified: Secondary | ICD-10-CM | POA: Insufficient documentation

## 2021-11-13 DIAGNOSIS — Z86718 Personal history of other venous thrombosis and embolism: Secondary | ICD-10-CM | POA: Diagnosis not present

## 2021-11-13 DIAGNOSIS — I824Z3 Acute embolism and thrombosis of unspecified deep veins of distal lower extremity, bilateral: Secondary | ICD-10-CM

## 2021-11-13 DIAGNOSIS — Z79899 Other long term (current) drug therapy: Secondary | ICD-10-CM | POA: Diagnosis not present

## 2021-11-13 DIAGNOSIS — Z8543 Personal history of malignant neoplasm of ovary: Secondary | ICD-10-CM | POA: Insufficient documentation

## 2021-11-13 DIAGNOSIS — I2699 Other pulmonary embolism without acute cor pulmonale: Secondary | ICD-10-CM

## 2021-11-13 DIAGNOSIS — R180 Malignant ascites: Secondary | ICD-10-CM

## 2021-11-13 LAB — COMPREHENSIVE METABOLIC PANEL
ALT: 15 U/L (ref 0–44)
AST: 21 U/L (ref 15–41)
Albumin: 4.3 g/dL (ref 3.5–5.0)
Alkaline Phosphatase: 61 U/L (ref 38–126)
Anion gap: 5 (ref 5–15)
BUN: 16 mg/dL (ref 8–23)
CO2: 32 mmol/L (ref 22–32)
Calcium: 9.6 mg/dL (ref 8.9–10.3)
Chloride: 102 mmol/L (ref 98–111)
Creatinine, Ser: 0.98 mg/dL (ref 0.44–1.00)
GFR, Estimated: >60 mL/min (ref >60)
Glucose, Bld: 64 mg/dL — ABNORMAL LOW (ref 70–99)
Potassium: 3.6 mmol/L (ref 3.5–5.1)
Sodium: 139 mmol/L (ref 135–145)
Total Bilirubin: 0.5 mg/dL (ref 0.3–1.2)
Total Protein: 7.6 g/dL (ref 6.5–8.1)

## 2021-11-13 LAB — CBC WITH DIFFERENTIAL/PLATELET
Abs Immature Granulocytes: 0 10*3/uL (ref 0.00–0.07)
Basophils Absolute: 0 10*3/uL (ref 0.0–0.1)
Basophils Relative: 1 %
Eosinophils Absolute: 0.2 10*3/uL (ref 0.0–0.5)
Eosinophils Relative: 4 %
HCT: 39.8 % (ref 36.0–46.0)
Hemoglobin: 13.4 g/dL (ref 12.0–15.0)
Immature Granulocytes: 0 %
Lymphocytes Relative: 43 %
Lymphs Abs: 1.8 10*3/uL (ref 0.7–4.0)
MCH: 31.3 pg (ref 26.0–34.0)
MCHC: 33.7 g/dL (ref 30.0–36.0)
MCV: 93 fL (ref 80.0–100.0)
Monocytes Absolute: 0.4 10*3/uL (ref 0.1–1.0)
Monocytes Relative: 8 %
Neutro Abs: 1.8 10*3/uL (ref 1.7–7.7)
Neutrophils Relative %: 44 %
Platelets: 259 10*3/uL (ref 150–400)
RBC: 4.28 MIL/uL (ref 3.87–5.11)
RDW: 12.5 % (ref 11.5–15.5)
WBC: 4.2 10*3/uL (ref 4.0–10.5)
nRBC: 0 % (ref 0.0–0.2)

## 2021-11-14 LAB — CA 125: Cancer Antigen (CA) 125: 9.5 U/mL (ref 0.0–38.1)

## 2021-11-15 ENCOUNTER — Inpatient Hospital Stay: Payer: 59 | Admitting: Hematology and Oncology

## 2021-11-15 ENCOUNTER — Encounter: Payer: Self-pay | Admitting: Hematology and Oncology

## 2021-11-15 ENCOUNTER — Other Ambulatory Visit: Payer: Self-pay

## 2021-11-15 VITALS — BP 131/78 | HR 58 | Temp 97.4°F | Resp 18 | Ht 64.0 in | Wt 159.0 lb

## 2021-11-15 DIAGNOSIS — C561 Malignant neoplasm of right ovary: Secondary | ICD-10-CM | POA: Diagnosis not present

## 2021-11-15 DIAGNOSIS — R911 Solitary pulmonary nodule: Secondary | ICD-10-CM | POA: Diagnosis not present

## 2021-11-15 DIAGNOSIS — Z79899 Other long term (current) drug therapy: Secondary | ICD-10-CM | POA: Diagnosis not present

## 2021-11-15 DIAGNOSIS — Z8543 Personal history of malignant neoplasm of ovary: Secondary | ICD-10-CM | POA: Diagnosis not present

## 2021-11-15 DIAGNOSIS — R918 Other nonspecific abnormal finding of lung field: Secondary | ICD-10-CM

## 2021-11-15 DIAGNOSIS — G609 Hereditary and idiopathic neuropathy, unspecified: Secondary | ICD-10-CM

## 2021-11-15 DIAGNOSIS — Z86718 Personal history of other venous thrombosis and embolism: Secondary | ICD-10-CM

## 2021-11-15 NOTE — Progress Notes (Signed)
[place] OFFICE PROGRESS NOTE  Patient Care Team: Ginger Organ., MD as PCP - General (Internal Medicine) Awanda Mink Craige Cotta, RN as Oncology Nurse Navigator (Oncology)  ASSESSMENT & PLAN:  Ovarian cancer [facility]) Clinically, she has no signs of cancer recurrence However, due to her high risk situation, I recommend repeat CT imaging this month and she is in agreement  Personal history of venous thrombosis and embolism She is at high risk of recurrent DVT/PE I recommend frequent ambulation, increase oral fluid as well as 81 mg aspirin when she travels  Multiple lung nodules on CT She has a history of lung nodules We will repeat imaging study to follow  Idiopathic neuropathy She has persistent neuropathy since chemotherapy She is taking gabapentin  Orders Placed This Encounter  Procedures   CT CHEST ABDOMEN PELVIS W CONTRAST    Standing Status:   Future    Standing Expiration Date:   11/16/2022    Order Specific Question:   Preferred imaging location?    Answer:   [facility]    Order Specific Question:   Radiology Contrast Protocol - do NOT remove file path    Answer:   [email or web address]    All questions were answered. The patient knows to call the clinic with any problems, questions or concerns. The total time spent in the appointment was 30 minutes encounter with patients including review of chart and various tests results, discussions about plan of care and coordination of care plan   Heath Lark, MD 11/15/2021 10:31 AM  INTERVAL HISTORY: Please see below for problem oriented charting. she returns for surveillance follow-up with her husband She stubbed her toe lately and injured it Otherwise, she feels fine except for persistent neuropathy Denies abdominal bloating, changes in bowel habits or nausea  REVIEW OF SYSTEMS:   Constitutional: Denies fevers, chills or abnormal weight loss Eyes: Denies blurriness  of vision Ears, nose, mouth, throat, and face: Denies mucositis or sore throat Respiratory: Denies cough, dyspnea or wheezes Cardiovascular: Denies palpitation, chest discomfort or lower extremity swelling Gastrointestinal:  Denies nausea, heartburn or change in bowel habits Skin: Denies abnormal skin rashes Lymphatics: Denies new lymphadenopathy or easy bruising Behavioral/Psych: Mood is stable, no new changes  All other systems were reviewed with the patient and are negative.  I have reviewed the past medical history, past surgical history, social history and family history with the patient and they are unchanged from previous note.  ALLERGIES:  has No Known Allergies.  MEDICATIONS:  Current Outpatient Medications  Medication Sig Dispense Refill   Alpha-Lipoic Acid 300 MG CAPS Take 1-2 capsules (300-600 mg total) by mouth once daily 60 capsule 11   cetirizine (ZYRTEC) 10 MG tablet Chew 10 mg by mouth daily.     Cholecalciferol 50 MCG (2000 UT) CAPS Take 2,000 Units by mouth daily.     cyanocobalamin (VITAMIN B12) 1000 MCG tablet Take 1,000 mcg by mouth daily.     fluticasone (FLONASE) 50 MCG/ACT nasal spray Place 1 spray into both nostrils daily.     gabapentin (NEURONTIN) 300 MG capsule Take 1 capsule (300 mg total) by mouth 3 (three) times daily 270 capsule 3   melatonin 5 MG TABS Take 5 mg by mouth.     No current facility-administered medications for this visit.   Facility-Administered Medications Ordered in Other Visits  Medication Dose Route Frequency Provider Last Rate Last Admin   iohexol (OMNIPAQUE) 350 MG/ML injection 100 mL  100 mL Intravenous Once  PRN Heath Lark, MD        SUMMARY OF ONCOLOGIC HISTORY: Oncology History Overview Note  Neg Genetics, unable to do HRD   Ovarian cancer ([place])  09/20/2019 Initial Diagnosis   Presented to her primary care doctor with abdominal bloating.  Abdominal x-ray imaging was unremarkable.   09/29/2019 Imaging   CT of the chest  showed pulmonary embolism in all lobes.  Large right/central pelvic mass likely ovarian neoplasm with omental/peritoneal metastasis with significant free fluid in the abdomen and pelvis.  A small left uterine calcification could be seen in a degenerating fibroid.  Small right and left lung base nodules, could represent sequela of previous infection or inflammation with metastasis not excluded.   09/29/2019 - 10/15/2019 Hospital Admission   She presented to the emergency department in [name] after complaints of leg pain with bilateral lower extremity edema.  D-dimer was elevated.  Lower extremity ultrasound demonstrated extensive bilateral DVT involving peroneal and soleal calf veins on the left and peroneal vein on the right.  CT abdomen and pelvis showed large pelvic mass with omental metastasis and ascites, worrisome for ovarian cancer.  CT imaging of the chest showed bilateral pulmonary nodules as well as evidence of pulmonary embolism.  She received anticoagulation therapy.  She was also found to be anemic, worrisome that she might have bleeding in the tumor and received transfusion.  Echocardiogram showed preserved ejection fraction but evidence of moderate right ventricular strain.  Subsequent biopsy showed cancer suspicious for ovarian primary and she received first cycle of neoadjuvant chemotherapy with carboplatin and taxol. She had IVC filter placement and temporary TPN   09/30/2019 Procedure   Ultrasound paracentesis was performed complicated by bleeding and received blood transfusion   09/30/2019 Procedure   She had IVC filter placement by interventional radiologist in [facility].   09/30/2019 Echocardiogram   Outside echocardiogram showed left ventricular ejection fraction around 79%.  Concentric left ventricular modeling.  Grade 1 diastolic pattern with normal left atrial pressure.  Right ventricle size is mildly enlarged.  Right ventricular systolic function is moderately reduced.  Right ventricle was  not well visualized.  Aortic sclerosis without evidence of stenosis.  Moderate pleural effusion on the left region   10/02/2019 Procedure   EGD was performed which showed no evidence of acute bleeding.  Biopsy of the stomach showed benign gastric mucosa with reactive gastropathy as may be seen in the healing phase of erosive gastritis.  Helicobacter organism testing is negative..   10/03/2019 Procedure   She underwent CT-guided biopsy of the pelvic mass.   10/03/2019 Tumor Marker   CEA level was 2.9.   10/03/2019 Pathology Results   Biopsy show adenocarcinoma.  The malignant cells stain positive for keratin AE1/AE3, CK7 and MOC-31.  Rare cells show weak reactivity with p63 and GA TA 3.  Cells are negative for CK20, calretinin, CDX2, TTF-1, ER and WT-1.  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  Possible consideration include, but not limited to, gynecological origin, pancreatico biliary and urothelial.   10/03/2019 Tumor Marker   Patient's tumor was tested for the following markers: CA-125 Results of the tumor marker test revealed 713 CA19-9 is 100   10/04/2019 Imaging   Tagged red blood cell scan equivocal for source of bleeding.   10/05/2019 Procedure   She underwent paracentesis.  IR removed 4100 cc of ascites fluid with significant blood noted.   10/05/2019 Imaging   CT abdomen and pelvis angiogram  protocol show no obvious source of bleeding.   10/05/2019 Imaging   CT imaging of the abdomen and pelvis showed no evidence of postprocedural hemorrhage or discrete area of GI bleeding.  Large volume ascites is present.  Small pockets of intraperitoneal free air are present, likely related to recent paracentesis.  Abnormal mass in the pelvic region most likely a neoplasm of uterine or ovarian origin.  Normal liver with benign cysts of the right hepatic dome.  Biliary tree and pancreas and spleen were  age-appropriate   10/07/2019 Procedure   Ultrasound paracentesis was performed.   10/07/2019 -  Chemotherapy   The patient had neoadjuvant carboplatin and taxol for chemotherapy treatment.     10/08/2019 Imaging   CT angiogram of the abdominal aorta and iliofemoral test was done showing 14 x 10 x 14 cm pelvic mass with mild to moderate ascites.  Normal appearance of arterial structures without evidence of compression.  No evidence of compression of the iliac vein or IVC.   10/11/2019 Imaging   Ultrasound shows gallbladder full of sludge.  Fatty liver changes.  Ascites present.   10/18/2019 Cancer Staging   Staging form: Ovary, Fallopian Tube, and Primary Peritoneal Carcinoma, AJCC 8th Edition - Clinical stage from 10/18/2019: FIGO Stage IIIC, calculated as Stage Unknown (cT3c, cNX, cM0) - Signed by Heath Lark, MD on 10/18/2019   10/21/2019 Tumor Marker   Patient's tumor was tested for the following markers: CA-125. Results of the tumor marker test revealed 257.   10/31/2019 - 03/21/2020 Chemotherapy   The patient had carboplatin and taxol for chemotherapy treatment.     11/22/2019 Tumor Marker   Patient's tumor was tested for the following markers: CA-125. Results of the tumor marker test revealed 30.7   12/12/2019 Imaging   1. Today's study demonstrates a positive response to therapy with regression of right ovarian lesion(s), and decreased volume of presumably malignant ascites. 2. No definitive evidence of metastatic disease in the thorax. Multiple tiny 2-4 mm pulmonary nodules are stable compared to prior examinations. Continued attention on follow-up studies is recommended to exclude the possibility of metastatic lesions    12/26/2019 Tumor Marker   Patient's tumor was tested for the following markers: CA-125 Results of the tumor marker test revealed 16.4   01/10/2020 Surgery   Surgeon: Donaciano Eva    Assistants: Dr Lahoma Crocker (an MD assistant was necessary for  tissue manipulation, management of robotic instrumentation, retraction and positioning due to the complexity of the case and hospital policies).  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    Post-operative Diagnosis: same   Operation: Robotic-assisted laparoscopic total hysterectomy with bilateral salpingoophorectomy, omentectomy, radical tumor debulking     Operative Findings:  : bulky right ovarian mass (approximately 6cm), mild nodularity to left ovary, tumor nodules on peritoneum of pelvis (all resected), no gross omental disease. No gross residual tumor at the completion of the procedure representing a complete (optimal) cytoreduction   01/10/2020 Pathology Results   Clinical History: Ovarian cancer ([facility])   FINAL MICROSCOPIC DIAGNOSIS:   A. UTERUS, CERVIX, BILATERAL TUBES AND OVARIES:  - Right ovary:       Microscopic foci of adenocarcinoma associated with extensive necrosis and hemosiderin deposition.       No ovarian surface involvement identified.       Paraovarian necrotic and hemorrhagic nodule.       See oncology table and comment.  - Left  Fallopian tube:       Microscopic focus of adenocarcinoma involving submucosa.       See comment.  - Cervix:       Nabothian cyst.       No dysplasia or malignancy.  - Endometrium:       Inactive.       No hyperplasia or malignancy.  - Right Fallopian tube:       Adhesions with focal hemosiderin deposition.       No malignancy identified.  - Left ovary:       Endosalpingosis.       No malignancy identified.   B. PERITONEAL NODULE, BIOPSY:  - Necrotic and hemorrhagic nodule with hemosiderin deposition.  - No viable malignancy identified.   C. OMENTUM:  - Foci of fibrosis associated with adhesions and hemosiderin deposition.  - No malignancy identified.  - One benign lymph node (0/1).   ONCOLOGY TABLE:  OVARY or FALLOPIAN  TUBE or PRIMARY PERITONEUM: Resection  Procedure: Hysterectomy with bilateral salpingo-oophorectomy, peritoneal nodule biopsy and omentectomy.  Specimen Integrity: Intact.  Tumor Site: Right ovary.  Tumor Size: 5.2 x 4.5 x 3.8 cm, see comment.  Histologic Type: Endometrioid adenocarcinoma.  Histologic Grade: FIGO grade 2.  Ovarian Surface Involvement: Not identified.  Fallopian Tube Surface Involvement: Not identified.  See comment.  Other Tissue/ Organ Involvement: Not identified.  Peritoneal/Ascitic Fluid Involvement: Not applicable.  Chemotherapy Response Score (CRS): Near complete response, CRS 3.  See  comment.  Regional Lymph Nodes: No lymph nodes submitted.  Distant Metastasis:       Distant Site(s) Involved: Not identified.  Pathologic Stage Classification (pTNM, AJCC 8th Edition): ypT2a, ypNX  Ancillary Studies: Can be performed if requested.  Representative Tumor Block: A6 and A7.    02/29/2020 Tumor Marker   Patient's tumor was tested for the following markers: CA-125 Results of the tumor marker test revealed 14.2   03/02/2020 Genetic Testing   Negative hereditary cancer genetic testing: no pathogenic variants detected in [facility] CustomNext-Cancer +RNAinsight Panel.  The report date is March 02, 2020.  HRD testing was unable to be performed due to insufficient sample availability.   The CustomNext-Cancer+RNAinsight panel offered by [facility] includes sequencing and rearrangement analysis for the following 47 genes:  APC, ATM, AXIN2, BARD1, BMPR1A, BRCA1, BRCA2, BRIP1, CDH1, CDK4, CDKN2A, CHEK2, DICER1, EPCAM, GREM1, HOXB13, MEN1, MLH1, MSH2, MSH3, MSH6, MUTYH, NBN, NF1, NF2, NTHL1, PALB2, PMS2, POLD1, POLE, PTEN, RAD51C, RAD51D, RECQL, RET, SDHA, SDHAF2, SDHB, SDHC, SDHD, SMAD4, SMARCA4, STK11, TP53, TSC1, TSC2, and VHL.  RNA data is routinely analyzed for use in variant interpretation for all genes.   03/21/2020 Tumor Marker   Patient's tumor was tested for the following  markers: CA-125 Results of the tumor marker test revealed 11.5   04/20/2020 Tumor Marker   Patient's tumor was tested for the following markers: CA-125 Results of the tumor marker test revealed 9.3   04/24/2020 Imaging   1. Status post interval hysterectomy and oophorectomy. 2. Interval resolution of previously seen small volume ascites throughout the abdomen and pelvis. There is minimal peritoneal thickening, for example in the right paracolic gutter. 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 3. Stable small pulmonary nodules of the left lung base, measuring 3 mm, most likely incidental and benign. Continued attention on follow-up.     10/24/2020 Tumor Marker   Patient's tumor was tested for the following markers: CA-125. Results of the tumor marker test revealed 9.  01/23/2021 Tumor Marker   Patient's tumor was tested for the following markers: CA-125. Results of the tumor marker test revealed 8.4.   05/16/2021 Tumor Marker   Patient's tumor was tested for the following markers: CA-125. Results of the tumor marker test revealed 9.4.    Tumor Marker   Patient's tumor was tested for the following markers: CA-125. Results of the tumor marker test revealed 9.5.     PHYSICAL EXAMINATION: ECOG PERFORMANCE STATUS: 1 - Symptomatic but completely ambulatory  Vitals:   11/15/21 0912  BP: 131/78  Pulse: (!) 58  Resp: 18  Temp: (!) 97.4 F (36.3 C)  SpO2: 99%   Filed Weights   11/15/21 0912  Weight: 159 lb (72.1 kg)    GENERAL:alert, no distress and comfortable SKIN: skin color, texture, turgor are normal, no rashes or significant lesions EYES: normal, Conjunctiva are pink and non-injected, sclera clear OROPHARYNX:no exudate, no erythema and lips, buccal mucosa, and tongue normal  NECK: supple, thyroid normal size, non-tender, without nodularity LYMPH:  no palpable lymphadenopathy in the cervical, axillary or inguinal LUNGS:  clear to auscultation and percussion with normal breathing effort HEART: regular rate & rhythm and no murmurs and no lower extremity edema ABDOMEN:abdomen soft, non-tender and normal bowel sounds Musculoskeletal:no cyanosis of digits and no clubbing  NEURO: alert & oriented x 3 with fluent speech, no focal motor/sensory deficits  LABORATORY DATA:  I have reviewed the data as listed    Component Value Date/Time   NA 139 11/13/2021 0904   NA 142 02/28/2019 1529   K 3.6 11/13/2021 0904   CL 102 11/13/2021 0904   CO2 32 11/13/2021 0904   GLUCOSE 64 (L) 11/13/2021 0904   BUN 16 11/13/2021 0904   BUN 14 02/28/2019 1529   CREATININE 0.98 11/13/2021 0904   CREATININE 0.85 10/24/2020 1121   CREATININE 0.79 05/28/2019 1156   CALCIUM 9.6 11/13/2021 0904   PROT 7.6 11/13/2021 0904   PROT 6.7 02/28/2019 1529   ALBUMIN 4.3 11/13/2021 0904   ALBUMIN 4.3 02/28/2019 1529   AST 21 11/13/2021 0904   AST 24 10/24/2020 1121   ALT 15 11/13/2021 0904   ALT 18 10/24/2020 1121   ALKPHOS 61 11/13/2021 0904   BILITOT 0.5 11/13/2021 0904   BILITOT 0.6 10/24/2020 1121   GFRNONAA >60 11/13/2021 0904   GFRNONAA >60 10/24/2020 1121   GFRNONAA 81 05/28/2019 1156   GFRAA 94 05/28/2019 1156    No results found for: "SPEP", "UPEP"  Lab Results  Component Value Date   WBC 4.2 11/13/2021   NEUTROABS 1.8 11/13/2021   HGB 13.4 11/13/2021   HCT 39.8 11/13/2021   MCV 93.0 11/13/2021   PLT 259 11/13/2021      Chemistry      Component Value Date/Time   NA 139 11/13/2021 0904   NA 142 02/28/2019 1529   K 3.6 11/13/2021 0904   CL 102 11/13/2021 0904   CO2 32 11/13/2021 0904   BUN 16 11/13/2021 0904   BUN 14 02/28/2019 1529   CREATININE 0.98 11/13/2021 0904   CREATININE 0.85 10/24/2020 1121   CREATININE 0.79 05/28/2019 1156      Component Value Date/Time   CALCIUM 9.6 11/13/2021 0904   ALKPHOS 61 11/13/2021 0904   AST 21 11/13/2021 0904   AST 24 10/24/2020 1121   ALT 15 11/13/2021 0904   ALT  18 10/24/2020 1121   BILITOT 0.5 11/13/2021 0904   BILITOT 0.6 10/24/2020 1121

## 2021-11-15 NOTE — Assessment & Plan Note (Signed)
Clinically, she has no signs of cancer recurrence However, due to her high risk situation, I recommend repeat CT imaging this month and she is in agreement

## 2021-11-15 NOTE — Assessment & Plan Note (Signed)
She has persistent neuropathy since chemotherapy She is taking gabapentin

## 2021-11-15 NOTE — Assessment & Plan Note (Signed)
She has a history of lung nodules We will repeat imaging study to follow

## 2021-11-15 NOTE — Assessment & Plan Note (Signed)
She is at high risk of recurrent DVT/PE I recommend frequent ambulation, increase oral fluid as well as 81 mg aspirin when she travels

## 2021-11-27 ENCOUNTER — Ambulatory Visit (INDEPENDENT_AMBULATORY_CARE_PROVIDER_SITE_OTHER): Payer: 59

## 2021-11-27 DIAGNOSIS — C561 Malignant neoplasm of right ovary: Secondary | ICD-10-CM

## 2021-11-27 DIAGNOSIS — R918 Other nonspecific abnormal finding of lung field: Secondary | ICD-10-CM | POA: Diagnosis not present

## 2021-11-27 DIAGNOSIS — Z8543 Personal history of malignant neoplasm of ovary: Secondary | ICD-10-CM | POA: Diagnosis not present

## 2021-11-27 DIAGNOSIS — C569 Malignant neoplasm of unspecified ovary: Secondary | ICD-10-CM | POA: Diagnosis not present

## 2021-11-27 DIAGNOSIS — K7689 Other specified diseases of liver: Secondary | ICD-10-CM | POA: Diagnosis not present

## 2021-11-27 MED ORDER — IOHEXOL 300 MG/ML  SOLN
100.0000 mL | Freq: Once | INTRAMUSCULAR | Status: AC | PRN
  Administered 2021-11-27: 100 mL via INTRAVENOUS

## 2021-11-29 ENCOUNTER — Other Ambulatory Visit: Payer: Self-pay

## 2021-11-29 ENCOUNTER — Encounter: Payer: Self-pay | Admitting: Hematology and Oncology

## 2021-11-29 ENCOUNTER — Inpatient Hospital Stay: Payer: 59 | Admitting: Hematology and Oncology

## 2021-11-29 VITALS — BP 142/85 | HR 61 | Temp 97.9°F | Resp 18 | Ht 64.0 in | Wt 159.2 lb

## 2021-11-29 DIAGNOSIS — R911 Solitary pulmonary nodule: Secondary | ICD-10-CM | POA: Diagnosis not present

## 2021-11-29 DIAGNOSIS — C561 Malignant neoplasm of right ovary: Secondary | ICD-10-CM

## 2021-11-29 DIAGNOSIS — R918 Other nonspecific abnormal finding of lung field: Secondary | ICD-10-CM

## 2021-11-29 DIAGNOSIS — G609 Hereditary and idiopathic neuropathy, unspecified: Secondary | ICD-10-CM | POA: Diagnosis not present

## 2021-11-29 DIAGNOSIS — Z86718 Personal history of other venous thrombosis and embolism: Secondary | ICD-10-CM | POA: Diagnosis not present

## 2021-11-29 DIAGNOSIS — Z79899 Other long term (current) drug therapy: Secondary | ICD-10-CM | POA: Diagnosis not present

## 2021-11-29 DIAGNOSIS — Z8543 Personal history of malignant neoplasm of ovary: Secondary | ICD-10-CM | POA: Diagnosis not present

## 2021-11-29 NOTE — Assessment & Plan Note (Signed)
CT imaging show no evidence of disease The lung nodules are likely benign, stable She will continue GYN follow-up in 3 months and I plan to see her again in 6 months

## 2021-11-29 NOTE — Assessment & Plan Note (Signed)
This is stable, likely benign Observe only

## 2021-11-29 NOTE — Progress Notes (Signed)
[place] OFFICE PROGRESS NOTE  Patient Care Team: Ginger Organ., MD as PCP - General (Internal Medicine) Awanda Mink Craige Cotta, RN as Oncology Nurse Navigator (Oncology)  ASSESSMENT & PLAN:  Ovarian cancer [facility]) CT imaging show no evidence of disease The lung nodules are likely benign, stable She will continue GYN follow-up in 3 months and I plan to see her again in 6 months  Multiple lung nodules on CT This is stable, likely benign Observe only  No orders of the defined types were placed in this encounter.   All questions were answered. The patient knows to call the clinic with any problems, questions or concerns. The total time spent in the appointment was 20 minutes encounter with patients including review of chart and various tests results, discussions about plan of care and coordination of care plan   Heath Lark, MD 11/29/2021 11:36 AM  INTERVAL HISTORY: Please see below for problem oriented charting. she returns for review of test results She is not symptomatic We discussed future plan of care and follow-up  REVIEW OF SYSTEMS:   Constitutional: Denies fevers, chills or abnormal weight loss Eyes: Denies blurriness of vision Ears, nose, mouth, throat, and face: Denies mucositis or sore throat Respiratory: Denies cough, dyspnea or wheezes Cardiovascular: Denies palpitation, chest discomfort or lower extremity swelling Gastrointestinal:  Denies nausea, heartburn or change in bowel habits Skin: Denies abnormal skin rashes Lymphatics: Denies new lymphadenopathy or easy bruising Neurological:Denies numbness, tingling or new weaknesses Behavioral/Psych: Mood is stable, no new changes  All other systems were reviewed with the patient and are negative.  I have reviewed the past medical history, past surgical history, social history and family history with the patient and they are unchanged from previous note.  ALLERGIES:  has No Known  Allergies.  MEDICATIONS:  Current Outpatient Medications  Medication Sig Dispense Refill   Alpha-Lipoic Acid 300 MG CAPS Take 1-2 capsules (300-600 mg total) by mouth once daily 60 capsule 11   cetirizine (ZYRTEC) 10 MG tablet Chew 10 mg by mouth daily.     Cholecalciferol 50 MCG (2000 UT) CAPS Take 2,000 Units by mouth daily.     cyanocobalamin (VITAMIN B12) 1000 MCG tablet Take 1,000 mcg by mouth daily.     fluticasone (FLONASE) 50 MCG/ACT nasal spray Place 1 spray into both nostrils daily.     gabapentin (NEURONTIN) 300 MG capsule Take 1 capsule (300 mg total) by mouth 3 (three) times daily 270 capsule 3   melatonin 5 MG TABS Take 5 mg by mouth.     No current facility-administered medications for this visit.    SUMMARY OF ONCOLOGIC HISTORY: Oncology History Overview Note  Neg Genetics, unable to do HRD   Ovarian cancer ([place])  09/20/2019 Initial Diagnosis   Presented to her primary care doctor with abdominal bloating.  Abdominal x-ray imaging was unremarkable.   09/29/2019 Imaging   CT of the chest showed pulmonary embolism in all lobes.  Large right/central pelvic mass likely ovarian neoplasm with omental/peritoneal metastasis with significant free fluid in the abdomen and pelvis.  A small left uterine calcification could be seen in a degenerating fibroid.  Small right and left lung base nodules, could represent sequela of previous infection or inflammation with metastasis not excluded.   09/29/2019 - 10/15/2019 Hospital Admission   She presented to the emergency department in [name] after complaints of leg pain with bilateral lower extremity edema.  D-dimer was elevated.  Lower extremity ultrasound demonstrated extensive bilateral DVT  involving peroneal and soleal calf veins on the left and peroneal vein on the right.  CT abdomen and pelvis showed large pelvic mass with omental metastasis and ascites, worrisome for ovarian cancer.  CT imaging of the chest showed bilateral pulmonary nodules  as well as evidence of pulmonary embolism.  She received anticoagulation therapy.  She was also found to be anemic, worrisome that she might have bleeding in the tumor and received transfusion.  Echocardiogram showed preserved ejection fraction but evidence of moderate right ventricular strain.  Subsequent biopsy showed cancer suspicious for ovarian primary and she received first cycle of neoadjuvant chemotherapy with carboplatin and taxol. She had IVC filter placement and temporary TPN   09/30/2019 Procedure   Ultrasound paracentesis was performed complicated by bleeding and received blood transfusion   09/30/2019 Procedure   She had IVC filter placement by interventional radiologist in [facility].   09/30/2019 Echocardiogram   Outside echocardiogram showed left ventricular ejection fraction around 79%.  Concentric left ventricular modeling.  Grade 1 diastolic pattern with normal left atrial pressure.  Right ventricle size is mildly enlarged.  Right ventricular systolic function is moderately reduced.  Right ventricle was not well visualized.  Aortic sclerosis without evidence of stenosis.  Moderate pleural effusion on the left region   10/02/2019 Procedure   EGD was performed which showed no evidence of acute bleeding.  Biopsy of the stomach showed benign gastric mucosa with reactive gastropathy as may be seen in the healing phase of erosive gastritis.  Helicobacter organism testing is negative..   10/03/2019 Procedure   She underwent CT-guided biopsy of the pelvic mass.   10/03/2019 Tumor Marker   CEA level was 2.9.   10/03/2019 Pathology Results   Biopsy show adenocarcinoma.  The malignant cells stain positive for keratin AE1/AE3, CK7 and MOC-31.  Rare cells show weak reactivity with p63 and GA TA 3.  Cells are negative for CK20, calretinin, CDX2, TTF-1, ER and WT-1.  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  Possible  consideration include, but not limited to, gynecological origin, pancreatico biliary and urothelial.   10/03/2019 Tumor Marker   Patient's tumor was tested for the following markers: CA-125 Results of the tumor marker test revealed 713 CA19-9 is 100   10/04/2019 Imaging   Tagged red blood cell scan equivocal for source of bleeding.   10/05/2019 Procedure   She underwent paracentesis.  IR removed 4100 cc of ascites fluid with significant blood noted.   10/05/2019 Imaging   CT abdomen and pelvis angiogram protocol show no obvious source of bleeding.   10/05/2019 Imaging   CT imaging of the abdomen and pelvis showed no evidence of postprocedural hemorrhage or discrete area of GI bleeding.  Large volume ascites is present.  Small pockets of intraperitoneal free air are present, likely related to recent paracentesis.  Abnormal mass in the pelvic region most likely a neoplasm of uterine or ovarian origin.  Normal liver with benign cysts of the right hepatic dome.  Biliary tree and pancreas and spleen were age-appropriate   10/07/2019 Procedure   Ultrasound paracentesis was performed.   10/07/2019 -  Chemotherapy   The patient had neoadjuvant carboplatin and taxol for chemotherapy treatment.     10/08/2019 Imaging   CT angiogram of the abdominal aorta and iliofemoral test was done showing 14 x 10 x 14 cm pelvic mass with mild to moderate ascites.  Normal appearance of arterial structures without evidence of compression.  No evidence of compression of the iliac vein or IVC.   10/11/2019 Imaging   Ultrasound shows gallbladder full of sludge.  Fatty liver changes.  Ascites present.   10/18/2019 Cancer Staging   Staging form: Ovary, Fallopian Tube, and Primary Peritoneal Carcinoma, AJCC 8th Edition - Clinical stage from 10/18/2019: FIGO Stage IIIC, calculated as Stage Unknown (cT3c, cNX, cM0) - Signed by Heath Lark, MD on 10/18/2019   10/21/2019 Tumor Marker   Patient's tumor was tested for the  following markers: CA-125. Results of the tumor marker test revealed 257.   10/31/2019 - 03/21/2020 Chemotherapy   The patient had carboplatin and taxol for chemotherapy treatment.     11/22/2019 Tumor Marker   Patient's tumor was tested for the following markers: CA-125. Results of the tumor marker test revealed 30.7   12/12/2019 Imaging   1. Today's study demonstrates a positive response to therapy with regression of right ovarian lesion(s), and decreased volume of presumably malignant ascites. 2. No definitive evidence of metastatic disease in the thorax. Multiple tiny 2-4 mm pulmonary nodules are stable compared to prior examinations. Continued attention on follow-up studies is recommended to exclude the possibility of metastatic lesions    12/26/2019 Tumor Marker   Patient's tumor was tested for the following markers: CA-125 Results of the tumor marker test revealed 16.4   01/10/2020 Surgery   Surgeon: Donaciano Eva    Assistants: Dr Lahoma Crocker (an MD assistant was necessary for tissue manipulation, management of robotic instrumentation, retraction and positioning due to the complexity of the case and hospital policies).  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    Post-operative Diagnosis: same   Operation: Robotic-assisted laparoscopic total hysterectomy with bilateral salpingoophorectomy, omentectomy, radical tumor debulking     Operative Findings:  : bulky right ovarian mass (approximately 6cm), mild nodularity to left ovary, tumor nodules on peritoneum of pelvis (all resected), no gross omental disease. No gross residual tumor at the completion of the procedure representing a complete (optimal) cytoreduction   01/10/2020 Pathology Results   Clinical History: Ovarian cancer ([facility])   FINAL MICROSCOPIC DIAGNOSIS:   A. UTERUS, CERVIX, BILATERAL TUBES AND  OVARIES:  - Right ovary:       Microscopic foci of adenocarcinoma associated with extensive necrosis and hemosiderin deposition.       No ovarian surface involvement identified.       Paraovarian necrotic and hemorrhagic nodule.       See oncology table and comment.  - Left Fallopian tube:       Microscopic focus of adenocarcinoma involving submucosa.       See comment.  - Cervix:       Nabothian cyst.       No dysplasia or malignancy.  - Endometrium:       Inactive.       No hyperplasia or malignancy.  - Right Fallopian tube:       Adhesions with focal hemosiderin deposition.       No malignancy identified.  - Left ovary:       Endosalpingosis.       No malignancy identified.   B. PERITONEAL NODULE, BIOPSY:  - Necrotic and hemorrhagic nodule with hemosiderin deposition.  - No viable malignancy identified.   C. OMENTUM:  - Foci of fibrosis associated with adhesions and hemosiderin deposition.  - No malignancy identified.  - One benign lymph node (0/1).   ONCOLOGY TABLE:  OVARY or FALLOPIAN TUBE or PRIMARY PERITONEUM: Resection  Procedure: Hysterectomy with bilateral salpingo-oophorectomy, peritoneal nodule biopsy and omentectomy.  Specimen Integrity: Intact.  Tumor Site: Right ovary.  Tumor Size: 5.2 x 4.5 x 3.8 cm, see comment.  Histologic Type: Endometrioid adenocarcinoma.  Histologic Grade: FIGO grade 2.  Ovarian Surface Involvement: Not identified.  Fallopian Tube Surface Involvement: Not identified.  See comment.  Other Tissue/ Organ Involvement: Not identified.  Peritoneal/Ascitic Fluid Involvement: Not applicable.  Chemotherapy Response Score (CRS): Near complete response, CRS 3.  See  comment.  Regional Lymph Nodes: No lymph nodes submitted.  Distant Metastasis:       Distant Site(s) Involved: Not identified.  Pathologic Stage Classification (pTNM, AJCC 8th Edition): ypT2a, ypNX  Ancillary Studies: Can be performed if requested.  Representative Tumor Block:  A6 and A7.    02/29/2020 Tumor Marker   Patient's tumor was tested for the following markers: CA-125 Results of the tumor marker test revealed 14.2   03/02/2020 Genetic Testing   Negative hereditary cancer genetic testing: no pathogenic variants detected in [facility]-Cancer +RNAinsight Panel.  The report date is March 02, 2020.  HRD testing was unable to be performed due to insufficient sample availability.   The CustomNext-Cancer+RNAinsight panel offered by [facility] includes sequencing and rearrangement analysis for the following 47 genes:  APC, ATM, AXIN2, BARD1, BMPR1A, BRCA1, BRCA2, BRIP1, CDH1, CDK4, CDKN2A, CHEK2, DICER1, EPCAM, GREM1, HOXB13, MEN1, MLH1, MSH2, MSH3, MSH6, MUTYH, NBN, NF1, NF2, NTHL1, PALB2, PMS2, POLD1, POLE, PTEN, RAD51C, RAD51D, RECQL, RET, SDHA, SDHAF2, SDHB, SDHC, SDHD, SMAD4, SMARCA4, STK11, TP53, TSC1, TSC2, and VHL.  RNA data is routinely analyzed for use in variant interpretation for all genes.   03/21/2020 Tumor Marker   Patient's tumor was tested for the following markers: CA-125 Results of the tumor marker test revealed 11.5   04/20/2020 Tumor Marker   Patient's tumor was tested for the following markers: CA-125 Results of the tumor marker test revealed 9.3   04/24/2020 Imaging   1. Status post interval hysterectomy and oophorectomy. 2. Interval resolution of previously seen small volume ascites throughout the abdomen and pelvis. There is minimal peritoneal thickening, for example in the right paracolic gutter. 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 3. Stable small pulmonary nodules of the left lung base, measuring 3 mm, most likely incidental and benign. Continued attention on follow-up.     10/24/2020 Tumor Marker   Patient's tumor was tested for the following markers: CA-125. Results of the tumor marker test revealed 9.   01/23/2021 Tumor Marker   Patient's tumor was tested for the  following markers: CA-125. Results of the tumor marker test revealed 8.4.   05/16/2021 Tumor Marker   Patient's tumor was tested for the following markers: CA-125. Results of the tumor marker test revealed 9.4.    Tumor Marker   Patient's tumor was tested for the following markers: CA-125. Results of the tumor marker test revealed 9.5.   11/28/2021 Imaging   Chest Impression:   1. No evidence of thoracic metastasis. 2. Three  small LEFT lobe pulmonary nodules unchanged from 2021   Abdomen / Pelvis Impression:   1. No evidence of ovarian carcinoma local recurrence. 2. No evidence of metastatic disease. 3. No free fluid or peritoneal nodularity.     PHYSICAL EXAMINATION: ECOG PERFORMANCE STATUS: 0 - Asymptomatic  Vitals:   11/29/21 1105  BP: (!) 142/85  Pulse: 61  Resp: 18  Temp: 97.9 F (36.6 C)  SpO2: 99%   Filed Weights   11/29/21 1105  Weight: 159 lb 3.2 oz (72.2 kg)    GENERAL:alert, no distress and comfortable NEURO: alert & oriented x 3 with fluent speech, no focal motor/sensory deficits  LABORATORY DATA:  I have reviewed the data as listed    Component Value Date/Time   NA 139 11/13/2021 0904   NA 142 02/28/2019 1529   K 3.6 11/13/2021 0904   CL 102 11/13/2021 0904   CO2 32 11/13/2021 0904   GLUCOSE 64 (L) 11/13/2021 0904   BUN 16 11/13/2021 0904   BUN 14 02/28/2019 1529   CREATININE 0.98 11/13/2021 0904   CREATININE 0.85 10/24/2020 1121   CREATININE 0.79 05/28/2019 1156   CALCIUM 9.6 11/13/2021 0904   PROT 7.6 11/13/2021 0904   PROT 6.7 02/28/2019 1529   ALBUMIN 4.3 11/13/2021 0904   ALBUMIN 4.3 02/28/2019 1529   AST 21 11/13/2021 0904   AST 24 10/24/2020 1121   ALT 15 11/13/2021 0904   ALT 18 10/24/2020 1121   ALKPHOS 61 11/13/2021 0904   BILITOT 0.5 11/13/2021 0904   BILITOT 0.6 10/24/2020 1121   GFRNONAA >60 11/13/2021 0904   GFRNONAA >60 10/24/2020 1121   GFRNONAA 81 05/28/2019 1156   GFRAA 94 05/28/2019 1156    No results found  for: "SPEP", "UPEP"  Lab Results  Component Value Date   WBC 4.2 11/13/2021   NEUTROABS 1.8 11/13/2021   HGB 13.4 11/13/2021   HCT 39.8 11/13/2021   MCV 93.0 11/13/2021   PLT 259 11/13/2021      Chemistry      Component Value Date/Time   NA 139 11/13/2021 0904   NA 142 02/28/2019 1529   K 3.6 11/13/2021 0904   CL 102 11/13/2021 0904   CO2 32 11/13/2021 0904   BUN 16 11/13/2021 0904   BUN 14 02/28/2019 1529   CREATININE 0.98 11/13/2021 0904   CREATININE 0.85 10/24/2020 1121   CREATININE 0.79 05/28/2019 1156      Component Value Date/Time   CALCIUM 9.6 11/13/2021 0904   ALKPHOS 61 11/13/2021 0904   AST 21 11/13/2021 0904   AST 24 10/24/2020 1121   ALT 15 11/13/2021 0904   ALT 18 10/24/2020 1121   BILITOT 0.5 11/13/2021 0904   BILITOT 0.6 10/24/2020 1121       RADIOGRAPHIC STUDIES: I have reviewed CT imaging with the patient and her husband I have personally reviewed the radiological images as listed and agreed with the findings in the report. CT CHEST ABDOMEN PELVIS W CONTRAST  Result Date: 11/28/2021 CLINICAL DATA:  Ovarian cancer. Diagnosis 2001 status post robotic assisted total hysterectomy and bilateral oophorectomy. Omentectomy and radical debulking. Patient status post chemotherapy. * Tracking Code: BO * EXAM: CT CHEST, ABDOMEN, AND PELVIS WITH CONTRAST TECHNIQUE: Multidetector CT imaging of the chest, abdomen and pelvis was performed following the standard protocol during bolus administration of intravenous contrast. RADIATION DOSE REDUCTION: This exam was performed according to the departmental dose-optimization program which includes automated exposure control, adjustment of the mA and/or kV according to patient size and/or use of iterative reconstruction technique. CONTRAST:  174m OMNIPAQUE IOHEXOL 300 MG/ML  SOLN COMPARISON:  CT 04/23/2020, 12/12/2019 FINDINGS: CT CHEST FINDINGS Cardiovascular: No significant vascular findings. Normal heart size. No pericardial  effusion. Mediastinum/Nodes: No axillary or supraclavicular adenopathy. No mediastinal or hilar adenopathy. No pericardial fluid. Esophagus normal. Lungs/Pleura: 3 mm nodule on the periphery of the LEFT lower lobe (image 110/3) is unchanged. 3 mm subpleural  nodule LEFT lower lobe on image 27/3 is unchanged Slightly more central nodule in the LEFT lower lobe measuring 3 mm (image 95/3) is present on CT from 2021. No change No new pulmonary nodules. Musculoskeletal: No aggressive osseous lesion. CT ABDOMEN AND PELVIS FINDINGS Hepatobiliary: Benign cyst in the RIGHT hepatic lobe. No new hepatic lesions. Gallbladder normal. Pancreas: Pancreas is normal. No ductal dilatation. No pancreatic inflammation. Spleen: Normal spleen Adrenals/urinary tract: Adrenal glands and kidneys are normal. The ureters and bladder normal. Stomach/Bowel: The stomach, duodenum, and small bowel normal. The colon and rectosigmoid colon are normal. Vascular/Lymphatic: Abdominal aorta is normal caliber. There is no retroperitoneal or periportal lymphadenopathy. No pelvic lymphadenopathy. Reproductive: Post hysterectomy. Adnexa unremarkable post oophorectomy. No suspicious nodularity in the pelvis. No lymphadenopathy Other: No free-fluid.  No peritoneal or omental nodularity. Musculoskeletal: No aggressive osseous lesion. IMPRESSION: Chest Impression: 1. No evidence of thoracic metastasis. 2. Three  small LEFT lobe pulmonary nodules unchanged from 2021 Abdomen / Pelvis Impression: 1. No evidence of ovarian carcinoma local recurrence. 2. No evidence of metastatic disease. 3. No free fluid or peritoneal nodularity. Electronically Signed   By: Suzy Bouchard M.D.   On: 11/28/2021 10:36

## 2021-12-25 IMAGING — CR DG FOOT COMPLETE 3+V*L*
3 series · 3 of 3 positions shown · non-contrast
Comparison: The foot radiograph dated 06/04/2020.

CLINICAL DATA: 60-year-old female with follow-up left foot
fracture.

EXAM:
LEFT FOOT - COMPLETE 3+ VIEW

[x foot ap left]
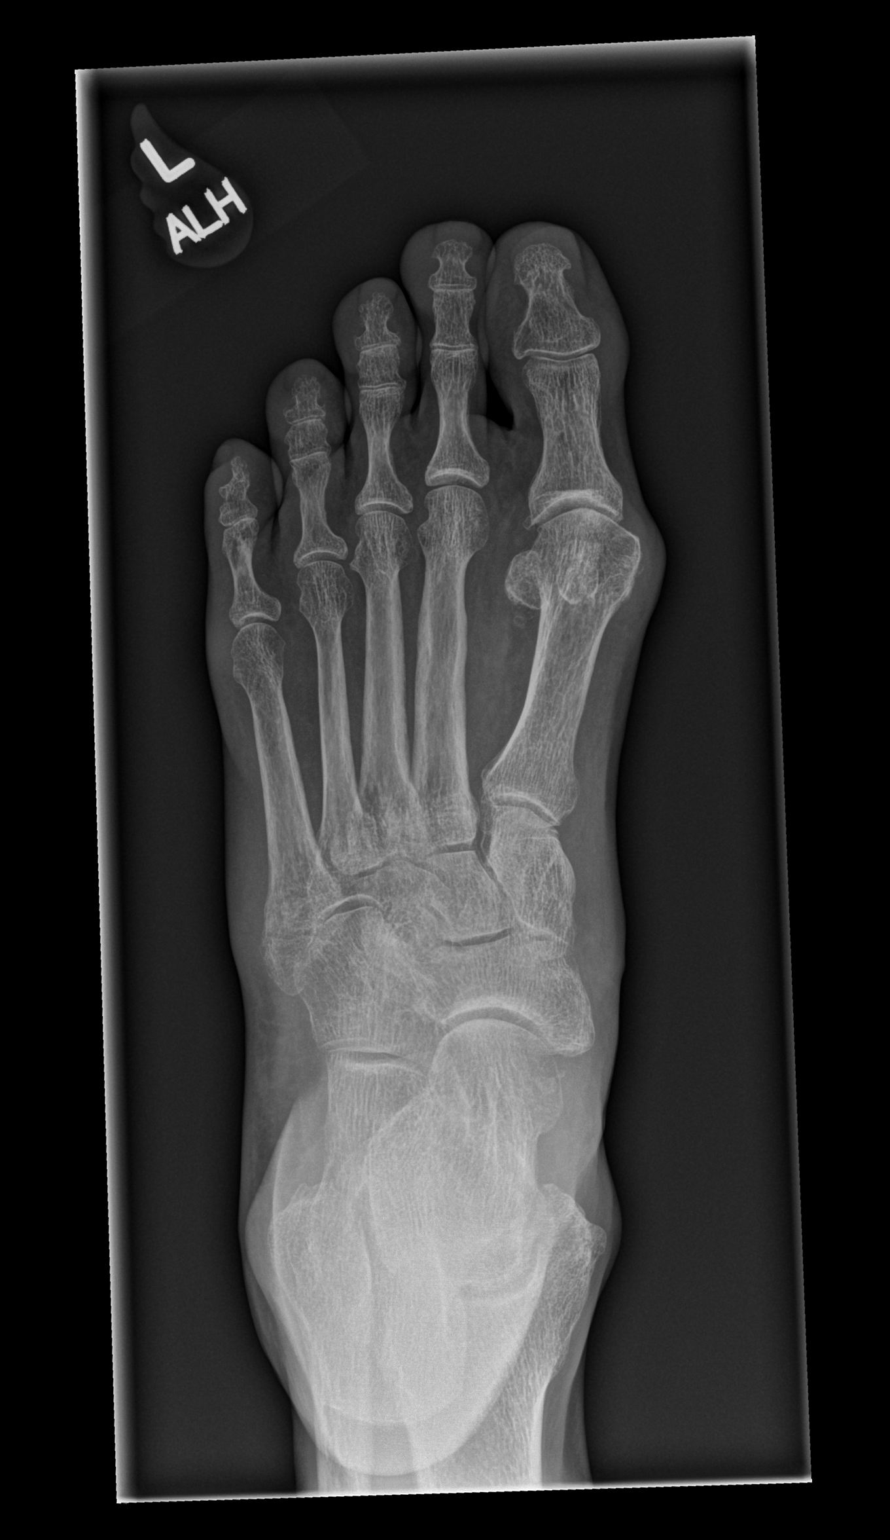

[x foot obl left]
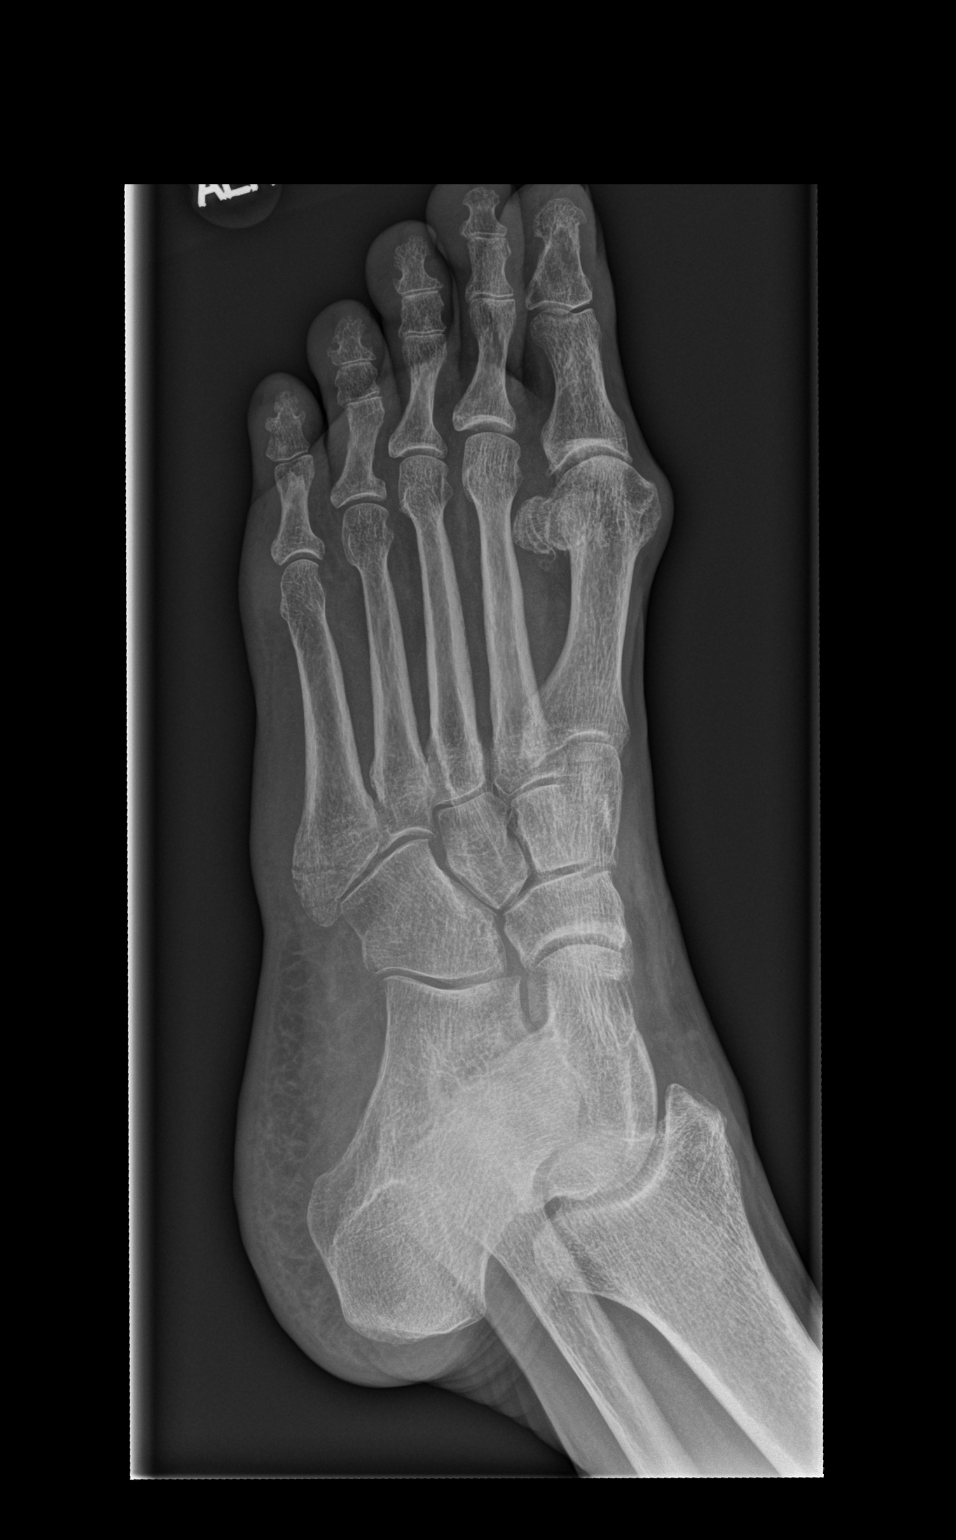

[x foot lat left]
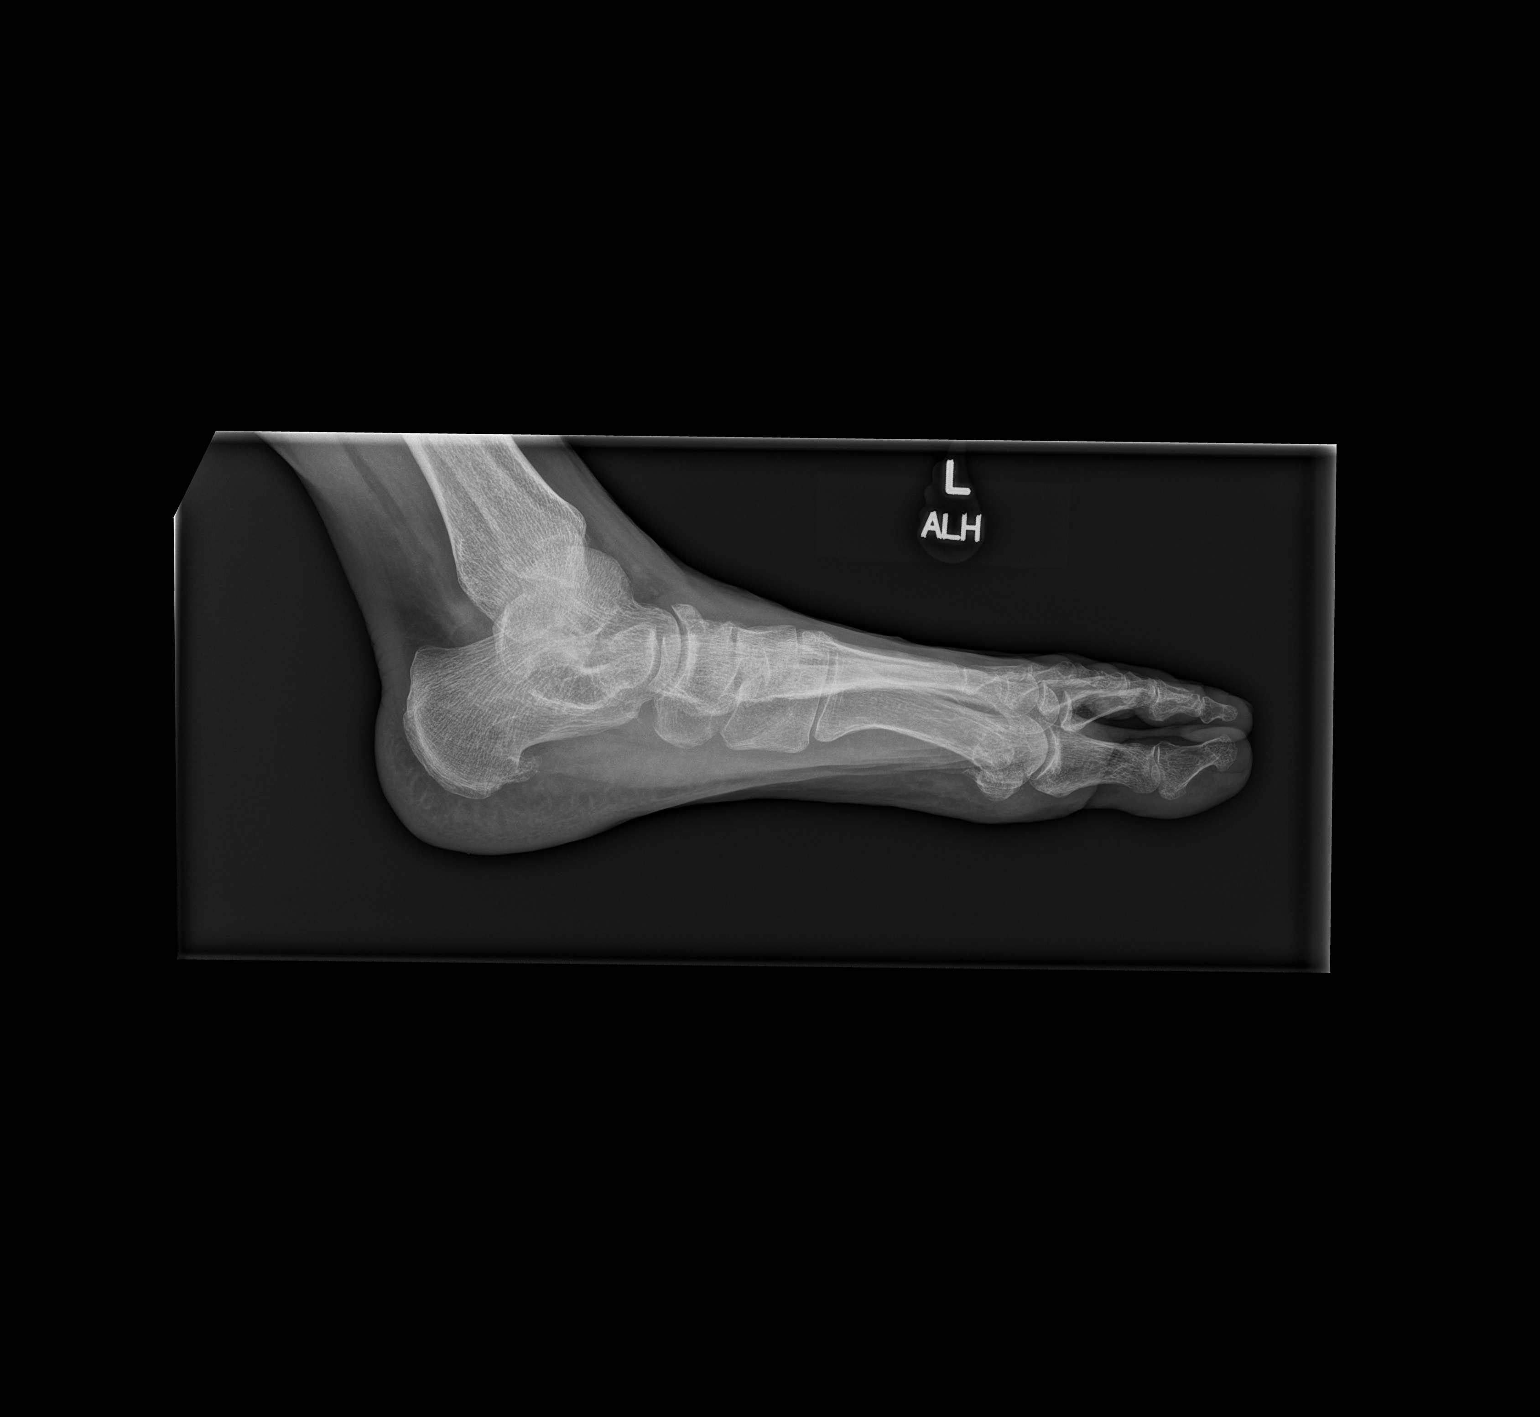

[3 of 3 positions shown; findings below may reference images not displayed]

FINDINGS: No significant interval change in the appearance of a nondisplaced
fracture of the base of the fifth metatarsal. No callus formation or
healing at this time. No new fracture. The bones are osteopenic.
There is no dislocation. The soft tissues are grossly unremarkable.
IMPRESSION: No significant interval change in the appearance of the nondisplaced
fracture of the base of the fifth metatarsal.

## 2022-01-22 ENCOUNTER — Telehealth: Payer: Self-pay | Admitting: Gynecologic Oncology

## 2022-01-22 ENCOUNTER — Telehealth: Payer: Self-pay | Admitting: *Deleted

## 2022-01-22 NOTE — Telephone Encounter (Signed)
Patient called to have lab work scheduled before visit with Dr. Berline Lopes. Forwarding information to that team to determine what labs are needed.

## 2022-01-22 NOTE — Telephone Encounter (Signed)
Spoke with the patient and explained that she needed a CA 125 drawn. Patient aware

## 2022-03-03 ENCOUNTER — Other Ambulatory Visit: Payer: Self-pay

## 2022-03-03 ENCOUNTER — Inpatient Hospital Stay: Payer: Commercial Managed Care - PPO | Attending: Hematology and Oncology

## 2022-03-03 ENCOUNTER — Encounter: Payer: Self-pay | Admitting: Gynecologic Oncology

## 2022-03-03 DIAGNOSIS — Z90710 Acquired absence of both cervix and uterus: Secondary | ICD-10-CM | POA: Insufficient documentation

## 2022-03-03 DIAGNOSIS — D61818 Other pancytopenia: Secondary | ICD-10-CM

## 2022-03-03 DIAGNOSIS — Z9221 Personal history of antineoplastic chemotherapy: Secondary | ICD-10-CM | POA: Diagnosis not present

## 2022-03-03 DIAGNOSIS — R180 Malignant ascites: Secondary | ICD-10-CM

## 2022-03-03 DIAGNOSIS — Z90722 Acquired absence of ovaries, bilateral: Secondary | ICD-10-CM | POA: Diagnosis not present

## 2022-03-03 DIAGNOSIS — I824Z3 Acute embolism and thrombosis of unspecified deep veins of distal lower extremity, bilateral: Secondary | ICD-10-CM

## 2022-03-03 DIAGNOSIS — Z8543 Personal history of malignant neoplasm of ovary: Secondary | ICD-10-CM | POA: Diagnosis not present

## 2022-03-03 DIAGNOSIS — I2699 Other pulmonary embolism without acute cor pulmonale: Secondary | ICD-10-CM

## 2022-03-03 DIAGNOSIS — Z86711 Personal history of pulmonary embolism: Secondary | ICD-10-CM | POA: Insufficient documentation

## 2022-03-03 DIAGNOSIS — R918 Other nonspecific abnormal finding of lung field: Secondary | ICD-10-CM

## 2022-03-03 DIAGNOSIS — C569 Malignant neoplasm of unspecified ovary: Secondary | ICD-10-CM

## 2022-03-03 LAB — CBC WITH DIFFERENTIAL/PLATELET
Abs Immature Granulocytes: 0 K/uL (ref 0.00–0.07)
Basophils Absolute: 0 K/uL (ref 0.0–0.1)
Basophils Relative: 1 %
Eosinophils Absolute: 0.1 K/uL (ref 0.0–0.5)
Eosinophils Relative: 3 %
HCT: 39.6 % (ref 36.0–46.0)
Hemoglobin: 13.1 g/dL (ref 12.0–15.0)
Immature Granulocytes: 0 %
Lymphocytes Relative: 35 %
Lymphs Abs: 1.3 K/uL (ref 0.7–4.0)
MCH: 30.5 pg (ref 26.0–34.0)
MCHC: 33.1 g/dL (ref 30.0–36.0)
MCV: 92.1 fL (ref 80.0–100.0)
Monocytes Absolute: 0.4 K/uL (ref 0.1–1.0)
Monocytes Relative: 11 %
Neutro Abs: 2 K/uL (ref 1.7–7.7)
Neutrophils Relative %: 50 %
Platelets: 240 K/uL (ref 150–400)
RBC: 4.3 MIL/uL (ref 3.87–5.11)
RDW: 12.7 % (ref 11.5–15.5)
WBC: 3.8 K/uL — ABNORMAL LOW (ref 4.0–10.5)
nRBC: 0 % (ref 0.0–0.2)

## 2022-03-03 LAB — COMPREHENSIVE METABOLIC PANEL WITH GFR
ALT: 13 U/L (ref 0–44)
AST: 20 U/L (ref 15–41)
Albumin: 4.2 g/dL (ref 3.5–5.0)
Alkaline Phosphatase: 63 U/L (ref 38–126)
Anion gap: 4 — ABNORMAL LOW (ref 5–15)
BUN: 10 mg/dL (ref 8–23)
CO2: 32 mmol/L (ref 22–32)
Calcium: 9.1 mg/dL (ref 8.9–10.3)
Chloride: 105 mmol/L (ref 98–111)
Creatinine, Ser: 0.92 mg/dL (ref 0.44–1.00)
GFR, Estimated: >60 mL/min
Glucose, Bld: 59 mg/dL — ABNORMAL LOW (ref 70–99)
Potassium: 4 mmol/L (ref 3.5–5.1)
Sodium: 141 mmol/L (ref 135–145)
Total Bilirubin: 0.4 mg/dL (ref 0.3–1.2)
Total Protein: 6.8 g/dL (ref 6.5–8.1)

## 2022-03-05 LAB — CA 125: Cancer Antigen (CA) 125: 9.1 U/mL (ref 0.0–38.1)

## 2022-03-06 ENCOUNTER — Inpatient Hospital Stay (HOSPITAL_BASED_OUTPATIENT_CLINIC_OR_DEPARTMENT_OTHER): Payer: Commercial Managed Care - PPO | Admitting: Gynecologic Oncology

## 2022-03-06 ENCOUNTER — Encounter: Payer: Self-pay | Admitting: Gynecologic Oncology

## 2022-03-06 ENCOUNTER — Other Ambulatory Visit: Payer: Self-pay

## 2022-03-06 VITALS — BP 125/61 | HR 64 | Temp 98.4°F | Resp 14 | Wt 156.6 lb

## 2022-03-06 DIAGNOSIS — Z9221 Personal history of antineoplastic chemotherapy: Secondary | ICD-10-CM | POA: Diagnosis not present

## 2022-03-06 DIAGNOSIS — Z8543 Personal history of malignant neoplasm of ovary: Secondary | ICD-10-CM

## 2022-03-06 DIAGNOSIS — Z86711 Personal history of pulmonary embolism: Secondary | ICD-10-CM | POA: Diagnosis not present

## 2022-03-06 DIAGNOSIS — C569 Malignant neoplasm of unspecified ovary: Secondary | ICD-10-CM

## 2022-03-06 DIAGNOSIS — Z90710 Acquired absence of both cervix and uterus: Secondary | ICD-10-CM | POA: Diagnosis not present

## 2022-03-06 DIAGNOSIS — Z90722 Acquired absence of ovaries, bilateral: Secondary | ICD-10-CM | POA: Diagnosis not present

## 2022-03-06 NOTE — Progress Notes (Signed)
Gynecologic Oncology Return Clinic Visit  03/06/22  Reason for Visit: Follow-up in the setting of ovarian cancer   Treatment History: Oncology History Overview Note  Neg Genetics, unable to do HRD   Ovarian cancer ([place])  09/20/2019 Initial Diagnosis   Presented to her primary care doctor with abdominal bloating.  Abdominal x-ray imaging was unremarkable.   09/29/2019 Imaging   CT of the chest showed pulmonary embolism in all lobes.  Large right/central pelvic mass likely ovarian neoplasm with omental/peritoneal metastasis with significant free fluid in the abdomen and pelvis.  A small left uterine calcification could be seen in a degenerating fibroid.  Small right and left lung base nodules, could represent sequela of previous infection or inflammation with metastasis not excluded.   09/29/2019 - 10/15/2019 Hospital Admission   She presented to the emergency department in [name] after complaints of leg pain with bilateral lower extremity edema.  D-dimer was elevated.  Lower extremity ultrasound demonstrated extensive bilateral DVT involving peroneal and soleal calf veins on the left and peroneal vein on the right.  CT abdomen and pelvis showed large pelvic mass with omental metastasis and ascites, worrisome for ovarian cancer.  CT imaging of the chest showed bilateral pulmonary nodules as well as evidence of pulmonary embolism.  She received anticoagulation therapy.  She was also found to be anemic, worrisome that she might have bleeding in the tumor and received transfusion.  Echocardiogram showed preserved ejection fraction but evidence of moderate right ventricular strain.  Subsequent biopsy showed cancer suspicious for ovarian primary and she received first cycle of neoadjuvant chemotherapy with carboplatin and taxol. She had IVC filter placement and temporary TPN   09/30/2019 Procedure   Ultrasound paracentesis was performed complicated by bleeding and received blood transfusion   09/30/2019  Procedure   She had IVC filter placement by interventional radiologist in [facility].   09/30/2019 Echocardiogram   Outside echocardiogram showed left ventricular ejection fraction around 79%.  Concentric left ventricular modeling.  Grade 1 diastolic pattern with normal left atrial pressure.  Right ventricle size is mildly enlarged.  Right ventricular systolic function is moderately reduced.  Right ventricle was not well visualized.  Aortic sclerosis without evidence of stenosis.  Moderate pleural effusion on the left region   10/02/2019 Procedure   EGD was performed which showed no evidence of acute bleeding.  Biopsy of the stomach showed benign gastric mucosa with reactive gastropathy as may be seen in the healing phase of erosive gastritis.  Helicobacter organism testing is negative..   10/03/2019 Procedure   She underwent CT-guided biopsy of the pelvic mass.   10/03/2019 Tumor Marker   CEA level was 2.9.   10/03/2019 Pathology Results   Biopsy show adenocarcinoma.  The malignant cells stain positive for keratin AE1/AE3, CK7 and MOC-31.  Rare cells show weak reactivity with p63 and GA TA 3.  Cells are negative for CK20, calretinin, CDX2, TTF-1, ER and WT-1.  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  Possible consideration include, but not limited to, gynecological origin, pancreatico biliary and urothelial.   10/03/2019 Tumor Marker   Patient's tumor was tested for the following markers: CA-125 Results of the tumor marker test revealed 713 CA19-9 is 100   10/04/2019 Imaging   Tagged red blood cell scan equivocal for source of bleeding.   10/05/2019 Procedure   She underwent paracentesis.  IR removed 4100 cc of ascites fluid with significant blood noted.   10/05/2019 Imaging  CT abdomen and pelvis angiogram protocol show no obvious source of bleeding.   10/05/2019 Imaging   CT imaging of the abdomen and pelvis  showed no evidence of postprocedural hemorrhage or discrete area of GI bleeding.  Large volume ascites is present.  Small pockets of intraperitoneal free air are present, likely related to recent paracentesis.  Abnormal mass in the pelvic region most likely a neoplasm of uterine or ovarian origin.  Normal liver with benign cysts of the right hepatic dome.  Biliary tree and pancreas and spleen were age-appropriate   10/07/2019 Procedure   Ultrasound paracentesis was performed.   10/07/2019 -  Chemotherapy   The patient had neoadjuvant carboplatin and taxol for chemotherapy treatment.     10/08/2019 Imaging   CT angiogram of the abdominal aorta and iliofemoral test was done showing 14 x 10 x 14 cm pelvic mass with mild to moderate ascites.  Normal appearance of arterial structures without evidence of compression.  No evidence of compression of the iliac vein or IVC.   10/11/2019 Imaging   Ultrasound shows gallbladder full of sludge.  Fatty liver changes.  Ascites present.   10/18/2019 Cancer Staging   Staging form: Ovary, Fallopian Tube, and Primary Peritoneal Carcinoma, AJCC 8th Edition - Clinical stage from 10/18/2019: FIGO Stage IIIC, calculated as Stage Unknown (cT3c, cNX, cM0) - Signed by Heath Lark, MD on 10/18/2019   10/21/2019 Tumor Marker   Patient's tumor was tested for the following markers: CA-125. Results of the tumor marker test revealed 257.   10/31/2019 - 03/21/2020 Chemotherapy   The patient had carboplatin and taxol for chemotherapy treatment.     11/22/2019 Tumor Marker   Patient's tumor was tested for the following markers: CA-125. Results of the tumor marker test revealed 30.7   12/12/2019 Imaging   1. Today's study demonstrates a positive response to therapy with regression of right ovarian lesion(s), and decreased volume of presumably malignant ascites. 2. No definitive evidence of metastatic disease in the thorax. Multiple tiny 2-4 mm pulmonary nodules are stable compared  to prior examinations. Continued attention on follow-up studies is recommended to exclude the possibility of metastatic lesions    12/26/2019 Tumor Marker   Patient's tumor was tested for the following markers: CA-125 Results of the tumor marker test revealed 16.4   01/10/2020 Surgery   Surgeon: Donaciano Eva    Assistants: Dr Lahoma Crocker (an MD assistant was necessary for tissue manipulation, management of robotic instrumentation, retraction and positioning due to the complexity of the case and hospital policies).  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    Post-operative Diagnosis: same   Operation: Robotic-assisted laparoscopic total hysterectomy with bilateral salpingoophorectomy, omentectomy, radical tumor debulking     Operative Findings:  : bulky right ovarian mass (approximately 6cm), mild nodularity to left ovary, tumor nodules on peritoneum of pelvis (all resected), no gross omental disease. No gross residual tumor at the completion of the procedure representing a complete (optimal) cytoreduction   01/10/2020 Pathology Results   Clinical History: Ovarian cancer ([facility])   FINAL MICROSCOPIC DIAGNOSIS:   A. UTERUS, CERVIX, BILATERAL TUBES AND OVARIES:  - Right ovary:       Microscopic foci of adenocarcinoma associated with extensive necrosis and hemosiderin deposition.       No ovarian surface involvement identified.       Paraovarian necrotic and hemorrhagic nodule.       See oncology table  and comment.  - Left Fallopian tube:       Microscopic focus of adenocarcinoma involving submucosa.       See comment.  - Cervix:       Nabothian cyst.       No dysplasia or malignancy.  - Endometrium:       Inactive.       No hyperplasia or malignancy.  - Right Fallopian tube:       Adhesions with focal hemosiderin deposition.       No malignancy identified.  - Left  ovary:       Endosalpingosis.       No malignancy identified.   B. PERITONEAL NODULE, BIOPSY:  - Necrotic and hemorrhagic nodule with hemosiderin deposition.  - No viable malignancy identified.   C. OMENTUM:  - Foci of fibrosis associated with adhesions and hemosiderin deposition.  - No malignancy identified.  - One benign lymph node (0/1).   ONCOLOGY TABLE:  OVARY or FALLOPIAN TUBE or PRIMARY PERITONEUM: Resection  Procedure: Hysterectomy with bilateral salpingo-oophorectomy, peritoneal nodule biopsy and omentectomy.  Specimen Integrity: Intact.  Tumor Site: Right ovary.  Tumor Size: 5.2 x 4.5 x 3.8 cm, see comment.  Histologic Type: Endometrioid adenocarcinoma.  Histologic Grade: FIGO grade 2.  Ovarian Surface Involvement: Not identified.  Fallopian Tube Surface Involvement: Not identified.  See comment.  Other Tissue/ Organ Involvement: Not identified.  Peritoneal/Ascitic Fluid Involvement: Not applicable.  Chemotherapy Response Score (CRS): Near complete response, CRS 3.  See  comment.  Regional Lymph Nodes: No lymph nodes submitted.  Distant Metastasis:       Distant Site(s) Involved: Not identified.  Pathologic Stage Classification (pTNM, AJCC 8th Edition): ypT2a, ypNX  Ancillary Studies: Can be performed if requested.  Representative Tumor Block: A6 and A7.    02/29/2020 Tumor Marker   Patient's tumor was tested for the following markers: CA-125 Results of the tumor marker test revealed 14.2   03/02/2020 Genetic Testing   Negative hereditary cancer genetic testing: no pathogenic variants detected in [facility] CustomNext-Cancer +RNAinsight Panel.  The report date is March 02, 2020.  HRD testing was unable to be performed due to insufficient sample availability.   The CustomNext-Cancer+RNAinsight panel offered by [facility] includes sequencing and rearrangement analysis for the following 47 genes:  APC, ATM, AXIN2, BARD1, BMPR1A, BRCA1, BRCA2, BRIP1, CDH1, CDK4, CDKN2A,  CHEK2, DICER1, EPCAM, GREM1, HOXB13, MEN1, MLH1, MSH2, MSH3, MSH6, MUTYH, NBN, NF1, NF2, NTHL1, PALB2, PMS2, POLD1, POLE, PTEN, RAD51C, RAD51D, RECQL, RET, SDHA, SDHAF2, SDHB, SDHC, SDHD, SMAD4, SMARCA4, STK11, TP53, TSC1, TSC2, and VHL.  RNA data is routinely analyzed for use in variant interpretation for all genes.   03/21/2020 Tumor Marker   Patient's tumor was tested for the following markers: CA-125 Results of the tumor marker test revealed 11.5   04/20/2020 Tumor Marker   Patient's tumor was tested for the following markers: CA-125 Results of the tumor marker test revealed 9.3   04/24/2020 Imaging   1. Status post interval hysterectomy and oophorectomy. 2. Interval resolution of previously seen small volume ascites throughout the abdomen and pelvis. There is minimal peritoneal thickening, for example in the right paracolic gutter. 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 3. Stable small pulmonary nodules of the left lung base, measuring 3 mm, most likely incidental and benign. Continued attention on follow-up.     10/24/2020 Tumor Marker   Patient's tumor was tested for the following markers: CA-125. Results of the tumor marker  test revealed 9.   01/23/2021 Tumor Marker   Patient's tumor was tested for the following markers: CA-125. Results of the tumor marker test revealed 8.4.   05/16/2021 Tumor Marker   Patient's tumor was tested for the following markers: CA-125. Results of the tumor marker test revealed 9.4.    Tumor Marker   Patient's tumor was tested for the following markers: CA-125. Results of the tumor marker test revealed 9.5.   11/28/2021 Imaging   Chest Impression:   1. No evidence of thoracic metastasis. 2. Three  small LEFT lobe pulmonary nodules unchanged from 2021   Abdomen / Pelvis Impression:   1. No evidence of ovarian carcinoma local recurrence. 2. No evidence of metastatic disease. 3. No free fluid or  peritoneal nodularity.   03/05/2022 Tumor Marker   Patient's tumor was tested for the following markers: CA-125. Results of the tumor marker test revealed 9.1.    The patient reported feeling vague symptoms of abdominal bloating and early satiety in late August and early September 2021.  She attributed this to some decreased activity due to her recent fractured toe.  She mentioned to her primary care physician who performed a pelvic exam which was unremarkable at that time and ordered a plain abdominal x-ray which showed stool burden but no other changes.  The patient subsequently departed for a planned trip to [name]  While out camping in [place] she developed severe shortness of breath and progressive abdominal distention and discomfort.  This caught her camping trip short and she was seen by local emergency department in [name] where she was admitted and CT scans were performed.  The CT scans showed pulmonary embolisms in all lobes.  A large right central pelvic mass which is likely an ovarian neoplasm measuring at least 11 cm.  There were omental and peritoneal metastases with significant ascites.  A small left uterine calcification consistent with a fibroid was seen.  There was small right and left lung base nodules which could represent sequelae of previous infection or possible metastases.   She was admitted to a hospital in [name] and an IVC filter was placed on September 30, 2019. Tumor markers on October 03, 2019 included a Ca1 25 which was elevated at 713.  CA 19-9 was elevated to 100.  CEA was normal at 2.9. Paracentesis was performed on September 30, 2019 and showed adenocarcinoma. CT-guided biopsy of the pelvic mass on October 03, 2019 showed adenocarcinoma with malignant stains positive for keratin, CK7 and MOC-31.  There were negative for CK20 calretinin CDX2, ER, and WT 1.  The findings were consistent with a poorly differentiated  adenocarcinoma of possible gynecologic primary.  Pancreas biliary urothelial primaries could also be considered.   She received her first cycle of carboplatin and paclitaxel as an inpatient in [name] on October 23, 2019.  She had been started on Eliquis for the DVT and PE.  An IVC filter had been placed as stated above.   She was able to return to [place] from [place] after her 1st cycle of chemotherapy and established care with Dr Denman George and Dr Alvy Bimler from [facility].   She completed her 4th cycle of chemotherapy with carboplatin and paclitaxel on 12/13/19.  CA 125 on 11/16/19, which was day 1 of cycle 3, was normal at 30.   CT chest, abdomen and pelvis on 12/12/19 showed a solid mass in the right ovary measuring 4.2 x 4.2  x 3.3 cm. Uterus and left ovary are unremarkable in appearance. Posteriorly to this there was a 4.9 x 4.1 x 4.6 cm macrolobulated cystic lesion with multiple thick internal septations. Overall, the combined size of these lesions or this lesion appears decreased compared to the prior examination from 10/05/2019, indicating a positive response to therapy.No new lesions were seen. There was reduction in ascites present.   It was determined that she had demonstrated good partial response to 4 cycles of neoadjuvant chemotherapy, a 3 month time period had elapsed since her PE, and was then a good candidate for interval surgical cytoreduction.    On 01/10/20 she underwent robotic assisted total hysterectomy, BSO, omentectomy. Intraoperative findings were significant for a bulky right ovarian mass measuring approximately 6 cm, mild nodularity to the left ovary, tumor nodules on the peritoneum of the pelvis which were all resected, no gross omental disease.  No gross residual tumor at the completion of the procedure representing complete, optimal cytoreduction. Surgery was uncomplicated.  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  The  focus of cancer that was residual measured 2 mm in greatest dimension.  There was a 1 mm focus of metastatic cancer in the left fallopian tube.  All other foci appeared necrotic with hemorrhagic nodules with no viable tissue remaining.  Chemotherapy response score was 3 with near complete response.   BRCA negative on genetics. CA 125 revealed normalization at the completion of therapy (14.2 on 02/29/20).  Interval History: Doing well.  Denies any vaginal bleeding or discharge.  Denies abdominal or pelvic pain.  Reports normal bowel bladder function.  Her husband recently retired; they are enjoying spending more time together since his retirement, traveling.  Past Medical/Surgical History: Past Medical History:  Diagnosis Date   Allergy    Anemia    Broken foot 2022   left   Cancer (HCC)    ovarian   DVT (deep venous thrombosis) (HCC)    Heart murmur    benign never an issue   Numbness and tingling    Osteopenia 08/2016   T score -1.6 FRAX 7.8%/0.7%   Peripheral neuropathy, idiopathic    Pulmonary embolism [facility])     Past Surgical History:  Procedure Laterality Date   fibroid in 2011,2003     IR IVC FILTER RETRIEVAL / S&I /IMG GUID/MOD SED  04/26/2020   IR RADIOLOGIST EVAL & MGMT  02/10/2020   miscarriage 1989     ovarian cyst 1992     Robotic-assisted laparoscopic total hysterectomy with bilateral salpingoophorectomy, omentectomy, radical tumor debulking   01/10/2020    Family History  Problem Relation Age of Onset   Leukemia Mother 58   Arthritis Father    Colon cancer Father 45   Prostate cancer Father 79   Colon cancer Paternal Uncle 47   Breast cancer Maternal Grandmother 80   Leukemia Maternal Grandfather 40   Colon cancer Cousin 21       paternal female cousin   Throat cancer Cousin 30       paternal female cousin   Prostate cancer Cousin        paternal cousin; dx early 52s; metastatic   Esophageal cancer Neg Hx    Rectal cancer Neg Hx    Stomach cancer Neg Hx     Pancreatic cancer Neg Hx     Social History   Socioeconomic History   Marital status: Married    Spouse name: [name]   Number of children:  3   Years of education: College   Highest education level: Not on file  Occupational History   Occupation: PRESCHOOL TEACHER    Employer: [employer]  Tobacco Use   Smoking status: Never   Smokeless tobacco: Never  Vaping Use   Vaping Use: Never used  Substance and Sexual Activity   Alcohol use: Yes    Comment: 3 a week   Drug use: No   Sexual activity: Yes    Partners: Male    Birth control/protection: Post-menopausal, Surgical    Comment: hysterectomy  Other Topics Concern   Not on file  Social History Narrative   Patient is married [name]) and lives at home with her husband. Married x 36 years.   Patient has three adult children; 2 in [place] (30, 20); one in [place] (25).  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   Patient has a college education.   Patient is right-handed.   Patient drinks one cup of coffee daily.   Pillates three times per week for sixteen years.     Social Determinants of Health   Financial Resource Strain: Not on file  Food Insecurity: Not on file  Transportation Needs: Not on file  Physical Activity: Not on file  Stress: Not on file  Social Connections: Not on file    Current Medications:  Current Outpatient Medications:    Alpha-Lipoic Acid 300 MG CAPS, Take 1-2 capsules (300-600 mg total) by mouth once daily, Disp: 60 capsule, Rfl: 11   cetirizine (ZYRTEC) 10 MG tablet, Chew 10 mg by mouth daily., Disp: , Rfl:    Cholecalciferol 50 MCG (2000 UT) CAPS, Take 2,000 Units by mouth daily., Disp: , Rfl:    cyanocobalamin (VITAMIN B12) 1000 MCG tablet, Take 1,000 mcg by mouth daily., Disp: , Rfl:    fluticasone (FLONASE) 50 MCG/ACT nasal spray, Place 1 spray into both nostrils daily., Disp: , Rfl:    gabapentin (NEURONTIN) 300 MG  capsule, Take 1 capsule (300 mg total) by mouth 3 (three) times daily, Disp: 270 capsule, Rfl: 3   melatonin 5 MG TABS, Take 5 mg by mouth., Disp: , Rfl:   Review of Systems: Denies appetite changes, fevers, chills, fatigue, unexplained weight changes. Denies hearing loss, neck lumps or masses, mouth sores, ringing in ears or voice changes. Denies cough or wheezing.  Denies shortness of breath. Denies chest pain or palpitations. Denies leg swelling. Denies abdominal distention, pain, blood in stools, constipation, diarrhea, nausea, vomiting, or early satiety. Denies pain with intercourse, dysuria, frequency, hematuria or incontinence. Denies hot flashes, pelvic pain, vaginal bleeding or vaginal discharge.   Denies joint pain, back pain or muscle pain/cramps. Denies itching, rash, or wounds. Denies dizziness, headaches, numbness or seizures. Denies swollen lymph nodes or glands, denies easy bruising or bleeding. Denies anxiety, depression, confusion, or decreased concentration.  Physical Exam: BP 125/61 (BP Location: Left Arm, Patient Position: Sitting)   Pulse 64   Temp 98.4 F (36.9 C) (Oral)   Resp 14   Wt 156 lb 9.6 oz (71 kg)   SpO2 97%   BMI 26.88 kg/m  General: Alert, oriented, no acute distress. HEENT: Normocephalic, atraumatic, sclera anicteric. Chest: Clear to auscultation bilaterally.  No wheezes or rhonchi. Cardiovascular: Regular rate and rhythm, no murmurs. Abdomen: soft, nontender.  Normoactive bowel sounds.  No masses or hepatosplenomegaly appreciated.  Extremities: Grossly normal range of motion.  Warm, well perfused.  No edema bilaterally.  Skin: No rashes or lesions noted. Lymphatics: No cervical, supraclavicular, or inguinal adenopathy. GU: Normal appearing external genitalia without erythema, excoriation, or lesions.  Speculum exam reveals mildly atrophic vaginal mucosa, cuff intact, no lesions or masses visible.  Bimanual exam reveals cuff intact, no nodularity  or masses.  Rectovaginal exam confirms findings.  Laboratory & Radiologic Studies: Component Ref Range & Units 3 d ago (03/03/22) 3 mo ago (11/13/21) 6 mo ago (08/19/21) 9 mo ago (05/13/21) 1 yr ago (01/23/21) 1 yr ago (10/24/20) 1 yr ago (08/07/20)  Cancer Antigen (CA) 125 0.0 - 38.1 U/mL 9.1 9.5 CM 9.1 CM 9.4 CM 8.4 CM 9.0 CM 8.1 CM   Assessment & Plan: Kristina Flores is a 62 y.o. 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 s/p neoadjuvant chemotherapy, interval cytoreduction (complete) on 01/10/20, adjuvant carboplatin and paclitaxel completed on 03/21/20.   She is overall doing well.  NED on exam today.   We discussed recent CA-125 (normal).   Per NCCN surveillance recommendations, will continue with visits every 3 months until at least 2 years out from completion of adjuvant therapy, which will be in March 2024.  Scheduled to see Dr. Alvy Bimler in May.  We discussed transitioning to visits every 4-6 months after that time.  I have asked her to call over the summer to schedule a visit to see me in September.     We reviewed signs and symptoms that would be concerning for cancer recurrence, and I stressed the importance of calling if she develops any of these.  20 minutes of total time was spent for this patient encounter, including preparation, face-to-face counseling with the patient and coordination of care, and documentation of the encounter.  Jeral Pinch, MD  Division of Gynecologic Oncology  Department of Obstetrics and Gynecology  [facility]

## 2022-03-06 NOTE — Patient Instructions (Signed)
It was good to see you today.  I do not see or feel any evidence of cancer recurrence on your exam.  I will see you for follow-up in 7-8 months.  Please call my office in June to schedule a visit to see me in September or October.  As always, if you develop any new and concerning symptoms before your next visit, please call to see me sooner.

## 2022-03-10 ENCOUNTER — Ambulatory Visit (INDEPENDENT_AMBULATORY_CARE_PROVIDER_SITE_OTHER): Payer: Commercial Managed Care - PPO | Admitting: Obstetrics & Gynecology

## 2022-03-10 ENCOUNTER — Encounter: Payer: Self-pay | Admitting: Obstetrics & Gynecology

## 2022-03-10 ENCOUNTER — Other Ambulatory Visit (HOSPITAL_COMMUNITY)
Admission: RE | Admit: 2022-03-10 | Discharge: 2022-03-10 | Disposition: A | Discharge status: Home / Self Care | Payer: Commercial Managed Care - PPO | Source: Ambulatory Visit | Attending: Obstetrics & Gynecology | Admitting: Obstetrics & Gynecology

## 2022-03-10 VITALS — BP 112/78 | HR 58 | Ht 63.0 in | Wt 155.0 lb

## 2022-03-10 DIAGNOSIS — Z01419 Encounter for gynecological examination (general) (routine) without abnormal findings: Secondary | ICD-10-CM

## 2022-03-10 DIAGNOSIS — M85851 Other specified disorders of bone density and structure, right thigh: Secondary | ICD-10-CM

## 2022-03-10 DIAGNOSIS — C561 Malignant neoplasm of right ovary: Secondary | ICD-10-CM

## 2022-03-10 DIAGNOSIS — Z780 Asymptomatic menopausal state: Secondary | ICD-10-CM | POA: Diagnosis not present

## 2022-03-10 NOTE — Progress Notes (Signed)
[place] [birth year] [id number]   History:    62 y.o.  [id number] Married.  Preschool Pharmacist, hospital, retired.  Beautiful trip to [place], [place] and to [place] (hiking/biking) last year.   RP:  Established patient presenting for annual gyn exam    HPI: Postmenopause, well on no hormone replacement therapy.  H/O Ovarian AdenoCa stage IIIB Dxed in 09/2019 with PE/Rt Ovarian mass.  Had ChemoRx first, then Robotic Total Laparoscopic Hysterectomy/BSO/Staging in 12/2019 with Dr Denman George and Chemotherapy again post surgery.  Ca125 was >700 at Dx and now normal every 3 months followed by Gyn-Onco with Dr Berline Lopes. No pelvic pain. Vagina shorter post Hysterectomy, but adjusting with intercourse, using a lubricant.  Pap Neg 01/2021.  Pap reflex today. Urine and bowel movements normal.  Doing Kegel exercises.  Breasts normal. Mammo Neg 06/2021.  Body mass index 27.46.  Healthy nutrition and physically active. Enjoys hiking and camping.  BD 11/2020 Osteopenia T-Score -1.4 and Frax normal. Repeat BD at 1 yrs.  Health labs with family physician. Colonoscopy in 2020 Has had the Flu vaccine.  Past medical history,surgical history, family history and social history were all reviewed and documented in the EPIC chart.  Gynecologic History No LMP recorded. Patient has had a hysterectomy.  Obstetric History OB History  Gravida Para Term Preterm AB Living  '4 3     1 3  '$ SAB IAB Ectopic Multiple Live Births               # Outcome Date GA Lbr Len/2nd Weight Sex Delivery Anes PTL Lv  4 AB           3 Para           2 Para           1 Para              ROS: A ROS was performed and pertinent positives and negatives are included in the history. GENERAL: No fevers or chills. HEENT: No change in vision, no earache, sore throat or sinus congestion. NECK: No pain or stiffness. CARDIOVASCULAR: No chest pain or pressure. No palpitations. PULMONARY: No shortness of breath, cough or wheeze. GASTROINTESTINAL: No abdominal  pain, nausea, vomiting or diarrhea, melena or bright red blood per rectum. GENITOURINARY: No urinary frequency, urgency, hesitancy or dysuria. MUSCULOSKELETAL: No joint or muscle pain, no back pain, no recent trauma. DERMATOLOGIC: No rash, no itching, no lesions. ENDOCRINE: No polyuria, polydipsia, no heat or cold intolerance. No recent change in weight. HEMATOLOGICAL: No anemia or easy bruising or bleeding. NEUROLOGIC: No headache, seizures, numbness, tingling or weakness. PSYCHIATRIC: No depression, no loss of interest in normal activity or change in sleep pattern.     Exam:   BP 112/78 (BP Location: Right Arm, Patient Position: Sitting, Cuff Size: Normal)   Pulse (!) 58   Ht '5\' 3"'$  (1.6 m)   Wt 155 lb (70.3 kg)   SpO2 96%   BMI 27.46 kg/m   Body mass index is 27.46 kg/m.  General appearance : Well developed well nourished female. No acute distress HEENT: Eyes: no retinal hemorrhage or exudates,  Neck supple, trachea midline, no carotid bruits, no thyroidmegaly Lungs: Clear to auscultation, no rhonchi or wheezes, or rib retractions  Heart: Regular rate and rhythm, no murmurs or gallops Breast:Examined in sitting and supine position were symmetrical in appearance, no palpable masses or tenderness,  no skin retraction, no nipple inversion, no nipple discharge, no skin discoloration, no axillary or supraclavicular lymphadenopathy  Abdomen: no palpable masses or tenderness, no rebound or guarding Extremities: no edema or skin discoloration or tenderness  Pelvic: Vulva: Normal             Vagina: No gross lesions or discharge.  Pap reflex done.  Cervix/Uterus absent  Adnexa  Without masses or tenderness  Anus: Normal   Assessment/Plan:  62 y.o. female for annual exam   1. Encounter for gynecological examination (general) (routine) without abnormal findings Postmenopause, well on no hormone replacement therapy.  H/O Ovarian AdenoCa stage IIIB Dxed in 09/2019 with PE/Rt Ovarian mass.  Had  ChemoRx first, then Robotic Total Laparoscopic Hysterectomy/BSO/Staging in 12/2019 with Dr Denman George and Chemotherapy again post surgery.  Ca125 was >700 at Dx and now normal every 3 months followed by Gyn-Onco with Dr Berline Lopes. No pelvic pain. Vagina shorter post Hysterectomy, but adjusting with intercourse, using a lubricant.  Pap Neg 01/2021.  Pap reflex today. Urine and bowel movements normal.  Doing Kegel exercises.  Breasts normal. Mammo Neg 06/2021.  Body mass index 27.46.  Healthy nutrition and physically active. Enjoys hiking and camping.  BD 11/2020 Osteopenia T-Score -1.4 and Frax normal. Repeat BD at 3 yrs.  Health labs with family physician. Colonoscopy in 2020 Has had the Flu vaccine. - Cytology - PAP( [facility])  2. Malignant neoplasm of right ovary ([facility])  H/O Ovarian AdenoCa stage IIIB Dxed in 09/2019 with PE/Rt Ovarian mass.  Had ChemoRx first, then Robotic Total Laparoscopic Hysterectomy/BSO/Staging in 12/2019 with Dr Denman George and Chemotherapy again post surgery.  Ca125 was >700 at Dx and now normal every 3 months followed by Gyn-Onco with Dr Berline Lopes. No pelvic pain. Vagina shorter post Hysterectomy, but adjusting with intercourse, using a lubricant.   3. Postmenopause Postmenopause, well on no hormone replacement therapy.    4. Osteopenia of neck of right femur  Healthy nutrition and physically active. Enjoys hiking and camping.  BD 11/2020 Osteopenia T-Score -1.4 and Frax normal. Repeat BD at 1 yrs.    [name], 2:28 PM

## 2022-03-11 LAB — CYTOLOGY - PAP: Diagnosis: NEGATIVE

## 2022-04-01 ENCOUNTER — Other Ambulatory Visit: Payer: Commercial Managed Care - PPO

## 2022-04-16 ENCOUNTER — Other Ambulatory Visit: Payer: Self-pay

## 2022-04-18 ENCOUNTER — Other Ambulatory Visit (HOSPITAL_COMMUNITY): Payer: Self-pay

## 2022-05-08 ENCOUNTER — Other Ambulatory Visit: Payer: Self-pay | Admitting: Obstetrics & Gynecology

## 2022-05-08 DIAGNOSIS — Z1231 Encounter for screening mammogram for malignant neoplasm of breast: Secondary | ICD-10-CM

## 2022-05-27 ENCOUNTER — Other Ambulatory Visit: Payer: Self-pay

## 2022-05-27 ENCOUNTER — Inpatient Hospital Stay: Payer: Commercial Managed Care - PPO | Attending: Hematology and Oncology

## 2022-05-27 DIAGNOSIS — Z8543 Personal history of malignant neoplasm of ovary: Secondary | ICD-10-CM | POA: Insufficient documentation

## 2022-05-27 DIAGNOSIS — Z9079 Acquired absence of other genital organ(s): Secondary | ICD-10-CM | POA: Diagnosis not present

## 2022-05-27 DIAGNOSIS — Z90710 Acquired absence of both cervix and uterus: Secondary | ICD-10-CM | POA: Diagnosis not present

## 2022-05-27 DIAGNOSIS — Z90722 Acquired absence of ovaries, bilateral: Secondary | ICD-10-CM | POA: Diagnosis not present

## 2022-05-27 DIAGNOSIS — R180 Malignant ascites: Secondary | ICD-10-CM

## 2022-05-27 DIAGNOSIS — G629 Polyneuropathy, unspecified: Secondary | ICD-10-CM | POA: Diagnosis not present

## 2022-05-27 DIAGNOSIS — R918 Other nonspecific abnormal finding of lung field: Secondary | ICD-10-CM

## 2022-05-27 DIAGNOSIS — D61818 Other pancytopenia: Secondary | ICD-10-CM

## 2022-05-27 DIAGNOSIS — C569 Malignant neoplasm of unspecified ovary: Secondary | ICD-10-CM

## 2022-05-27 DIAGNOSIS — I2699 Other pulmonary embolism without acute cor pulmonale: Secondary | ICD-10-CM

## 2022-05-27 DIAGNOSIS — I824Z3 Acute embolism and thrombosis of unspecified deep veins of distal lower extremity, bilateral: Secondary | ICD-10-CM

## 2022-05-27 LAB — COMPREHENSIVE METABOLIC PANEL
ALT: 15 U/L (ref 0–44)
AST: 21 U/L (ref 15–41)
Albumin: 4.6 g/dL (ref 3.5–5.0)
Alkaline Phosphatase: 63 U/L (ref 38–126)
Anion gap: 5 (ref 5–15)
BUN: 15 mg/dL (ref 8–23)
CO2: 32 mmol/L (ref 22–32)
Calcium: 9.5 mg/dL (ref 8.9–10.3)
Chloride: 105 mmol/L (ref 98–111)
Creatinine, Ser: 1.04 mg/dL — ABNORMAL HIGH (ref 0.44–1.00)
GFR, Estimated: >60 mL/min (ref >60)
Glucose, Bld: 58 mg/dL — ABNORMAL LOW (ref 70–99)
Potassium: 4 mmol/L (ref 3.5–5.1)
Sodium: 142 mmol/L (ref 135–145)
Total Bilirubin: 0.4 mg/dL (ref 0.3–1.2)
Total Protein: 7.3 g/dL (ref 6.5–8.1)

## 2022-05-27 LAB — CBC WITH DIFFERENTIAL/PLATELET
Abs Immature Granulocytes: 0.01 10*3/uL (ref 0.00–0.07)
Basophils Absolute: 0 10*3/uL (ref 0.0–0.1)
Basophils Relative: 1 %
Eosinophils Absolute: 0.1 10*3/uL (ref 0.0–0.5)
Eosinophils Relative: 3 %
HCT: 40.7 % (ref 36.0–46.0)
Hemoglobin: 13.6 g/dL (ref 12.0–15.0)
Immature Granulocytes: 0 %
Lymphocytes Relative: 42 %
Lymphs Abs: 1.6 10*3/uL (ref 0.7–4.0)
MCH: 31.4 pg (ref 26.0–34.0)
MCHC: 33.4 g/dL (ref 30.0–36.0)
MCV: 94 fL (ref 80.0–100.0)
Monocytes Absolute: 0.3 10*3/uL (ref 0.1–1.0)
Monocytes Relative: 9 %
Neutro Abs: 1.8 10*3/uL (ref 1.7–7.7)
Neutrophils Relative %: 45 %
Platelets: 244 10*3/uL (ref 150–400)
RBC: 4.33 MIL/uL (ref 3.87–5.11)
RDW: 12.7 % (ref 11.5–15.5)
WBC: 3.9 10*3/uL — ABNORMAL LOW (ref 4.0–10.5)
nRBC: 0 % (ref 0.0–0.2)

## 2022-05-28 LAB — CA 125: Cancer Antigen (CA) 125: 9 U/mL (ref 0.0–38.1)

## 2022-06-03 ENCOUNTER — Inpatient Hospital Stay (HOSPITAL_BASED_OUTPATIENT_CLINIC_OR_DEPARTMENT_OTHER): Payer: Commercial Managed Care - PPO | Admitting: Hematology and Oncology

## 2022-06-03 ENCOUNTER — Other Ambulatory Visit: Payer: Self-pay

## 2022-06-03 ENCOUNTER — Encounter: Payer: Self-pay | Admitting: Hematology and Oncology

## 2022-06-03 VITALS — BP 147/84 | HR 62 | Temp 98.2°F | Resp 18 | Ht 63.0 in | Wt 161.6 lb

## 2022-06-03 DIAGNOSIS — Z9079 Acquired absence of other genital organ(s): Secondary | ICD-10-CM | POA: Diagnosis not present

## 2022-06-03 DIAGNOSIS — C561 Malignant neoplasm of right ovary: Secondary | ICD-10-CM

## 2022-06-03 DIAGNOSIS — Z90722 Acquired absence of ovaries, bilateral: Secondary | ICD-10-CM | POA: Diagnosis not present

## 2022-06-03 DIAGNOSIS — G629 Polyneuropathy, unspecified: Secondary | ICD-10-CM | POA: Diagnosis not present

## 2022-06-03 DIAGNOSIS — Z8543 Personal history of malignant neoplasm of ovary: Secondary | ICD-10-CM | POA: Diagnosis not present

## 2022-06-03 DIAGNOSIS — Z90710 Acquired absence of both cervix and uterus: Secondary | ICD-10-CM | POA: Diagnosis not present

## 2022-06-03 NOTE — Assessment & Plan Note (Signed)
Clinically, her examination is benign I recommend CT imaging to be done next month at 2 years mark since completion of treatment for assessment This is due to her high risk disease at presentation We discussed future follow-up beyond 2 years after completion of treatment

## 2022-06-03 NOTE — Progress Notes (Signed)
[facility] OFFICE PROGRESS NOTE  Patient Care Team: Cleatis Polka., MD as PCP - General (Internal Medicine) Paulina Fusi Servando Snare, RN as Oncology Nurse Navigator (Oncology)  ASSESSMENT & PLAN:  Ovarian cancer [facility]) Clinically, her examination is benign I recommend CT imaging to be done next month at 2 years mark since completion of treatment for assessment This is due to her high risk disease at presentation We discussed future follow-up beyond 2 years after completion of treatment  Orders Placed This Encounter  Procedures   CT ABDOMEN PELVIS W CONTRAST    Standing Status:   Future    Standing Expiration Date:   06/03/2023    Order Specific Question:   If indicated for the ordered procedure, I authorize the administration of contrast media per Radiology protocol    Answer:   Yes    Order Specific Question:   Does the patient have a contrast media/X-ray dye allergy?    Answer:   No    Order Specific Question:   Preferred imaging location?    Answer:   [facility]    Order Specific Question:   If indicated for the ordered procedure, I authorize the administration of oral contrast media per Radiology protocol    Answer:   Yes    All questions were answered. The patient knows to call the clinic with any problems, questions or concerns. The total time spent in the appointment was 20 minutes encounter with patients including review of chart and various tests results, discussions about plan of care and coordination of care plan   Artis Delay, MD 06/03/2022 10:53 AM  INTERVAL HISTORY: Please see below for problem oriented charting. 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  REVIEW OF SYSTEMS:   Constitutional: Denies fevers, chills or abnormal  weight loss Eyes: Denies blurriness of vision Ears, nose, mouth, throat, and face: Denies mucositis or sore throat Respiratory: Denies cough, dyspnea or wheezes Cardiovascular: Denies palpitation, chest discomfort or lower extremity swelling Gastrointestinal:  Denies nausea, heartburn or change in bowel habits Skin: Denies abnormal skin rashes Lymphatics: Denies new lymphadenopathy or easy bruising Neurological:Denies numbness, tingling or new weaknesses Behavioral/Psych: Mood is stable, no new changes  All other systems were reviewed with the patient and are negative.  I have reviewed the past medical history, past surgical history, social history and family history with the patient and they are unchanged from previous note.  ALLERGIES:  has No Known Allergies.  MEDICATIONS:  Current Outpatient Medications  Medication Sig Dispense Refill   Alpha-Lipoic Acid 300 MG CAPS Take 1-2 capsules (300-600 mg total) by mouth once daily 60 capsule 11   cetirizine (ZYRTEC) 10 MG tablet Chew 10 mg by mouth daily.     Cholecalciferol 50 MCG (2000 UT) CAPS Take 2,000 Units by mouth daily.     cyanocobalamin (VITAMIN B12) 1000 MCG tablet Take 1,000 mcg by mouth daily.     fluticasone (FLONASE) 50 MCG/ACT nasal spray Place 1 spray into both nostrils daily.     gabapentin (NEURONTIN) 300 MG capsule Take 1 capsule (300 mg total) by mouth 3 (three) times daily 270 capsule 3   melatonin 5 MG TABS Take 5 mg by mouth.     No current facility-administered medications for this visit.    SUMMARY OF ONCOLOGIC  HISTORY: Oncology History Overview Note  Neg Genetics, unable to do HRD   Ovarian cancer (HCC)  09/20/2019 Initial Diagnosis   Presented to her primary care doctor with abdominal bloating.  Abdominal x-ray imaging was unremarkable.   09/29/2019 Imaging   CT of the chest showed pulmonary embolism in all lobes.  Large right/central pelvic mass likely ovarian neoplasm with omental/peritoneal metastasis  with significant free fluid in the abdomen and pelvis.  A small left uterine calcification could be seen in a degenerating fibroid.  Small right and left lung base nodules, could represent sequela of previous infection or inflammation with metastasis not excluded.   09/29/2019 - 10/15/2019 Hospital Admission   She presented to the emergency department in [place] after complaints of leg pain with bilateral lower extremity edema.  D-dimer was elevated.  Lower extremity ultrasound demonstrated extensive bilateral DVT involving peroneal and soleal calf veins on the left and peroneal vein on the right.  CT abdomen and pelvis showed large pelvic mass with omental metastasis and ascites, worrisome for ovarian cancer.  CT imaging of the chest showed bilateral pulmonary nodules as well as evidence of pulmonary embolism.  She received anticoagulation therapy.  She was also found to be anemic, worrisome that she might have bleeding in the tumor and received transfusion.  Echocardiogram showed preserved ejection fraction but evidence of moderate right ventricular strain.  Subsequent biopsy showed cancer suspicious for ovarian primary and she received first cycle of neoadjuvant chemotherapy with carboplatin and taxol. She had IVC filter placement and temporary TPN   09/30/2019 Procedure   Ultrasound paracentesis was performed complicated by bleeding and received blood transfusion   09/30/2019 Procedure   She had IVC filter placement by interventional radiologist in [facility].   09/30/2019 Echocardiogram   Outside echocardiogram showed left ventricular ejection fraction around 79%.  Concentric left ventricular modeling.  Grade 1 diastolic pattern with normal left atrial pressure.  Right ventricle size is mildly enlarged.  Right ventricular systolic function is moderately reduced.  Right ventricle was not well visualized.  Aortic sclerosis without evidence of stenosis.  Moderate pleural effusion on the left region   10/02/2019  Procedure   EGD was performed which showed no evidence of acute bleeding.  Biopsy of the stomach showed benign gastric mucosa with reactive gastropathy as may be seen in the healing phase of erosive gastritis.  Helicobacter organism testing is negative..   10/03/2019 Procedure   She underwent CT-guided biopsy of the pelvic mass.   10/03/2019 Tumor Marker   CEA level was 2.9.   10/03/2019 Pathology Results   Biopsy show adenocarcinoma.  The malignant cells stain positive for keratin AE1/AE3, CK7 and MOC-31.  Rare cells show weak reactivity with p63 and GA TA 3.  Cells are negative for CK20, calretinin, CDX2, TTF-1, ER and WT-1.  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  Possible consideration include, but not limited to, gynecological origin, pancreatico biliary and urothelial.   10/03/2019 Tumor Marker   Patient's tumor was tested for the following markers: CA-125 Results of the tumor marker test revealed 713 CA19-9 is 100   10/04/2019 Imaging   Tagged red blood cell scan equivocal for source of bleeding.   10/05/2019 Procedure   She underwent paracentesis.  IR removed 4100 cc of ascites fluid with significant blood noted.   10/05/2019 Imaging   CT abdomen and pelvis angiogram protocol show no obvious source of bleeding.   10/05/2019 Imaging   CT  imaging of the abdomen and pelvis showed no evidence of postprocedural hemorrhage or discrete area of GI bleeding.  Large volume ascites is present.  Small pockets of intraperitoneal free air are present, likely related to recent paracentesis.  Abnormal mass in the pelvic region most likely a neoplasm of uterine or ovarian origin.  Normal liver with benign cysts of the right hepatic dome.  Biliary tree and pancreas and spleen were age-appropriate   10/07/2019 Procedure   Ultrasound paracentesis was performed.   10/07/2019 -  Chemotherapy   The patient had neoadjuvant  carboplatin and taxol for chemotherapy treatment.     10/08/2019 Imaging   CT angiogram of the abdominal aorta and iliofemoral test was done showing 14 x 10 x 14 cm pelvic mass with mild to moderate ascites.  Normal appearance of arterial structures without evidence of compression.  No evidence of compression of the iliac vein or IVC.   10/11/2019 Imaging   Ultrasound shows gallbladder full of sludge.  Fatty liver changes.  Ascites present.   10/18/2019 Cancer Staging   Staging form: Ovary, Fallopian Tube, and Primary Peritoneal Carcinoma, AJCC 8th Edition - Clinical stage from 10/18/2019: FIGO Stage IIIC, calculated as Stage Unknown (cT3c, cNX, cM0) - Signed by Artis Delay, MD on 10/18/2019   10/21/2019 Tumor Marker   Patient's tumor was tested for the following markers: CA-125. Results of the tumor marker test revealed 257.   10/31/2019 - 03/21/2020 Chemotherapy   The patient had carboplatin and taxol for chemotherapy treatment.     11/22/2019 Tumor Marker   Patient's tumor was tested for the following markers: CA-125. Results of the tumor marker test revealed 30.7   12/12/2019 Imaging   1. Today's study demonstrates a positive response to therapy with regression of right ovarian lesion(s), and decreased volume of presumably malignant ascites. 2. No definitive evidence of metastatic disease in the thorax. Multiple tiny 2-4 mm pulmonary nodules are stable compared to prior examinations. Continued attention on follow-up studies is recommended to exclude the possibility of metastatic lesions    12/26/2019 Tumor Marker   Patient's tumor was tested for the following markers: CA-125 Results of the tumor marker test revealed 16.4   01/10/2020 Surgery   Surgeon: Quinn Axe    Assistants: Dr Antionette Char (an MD assistant was necessary for tissue manipulation, management of robotic instrumentation, retraction and positioning due to the complexity of the case and hospital policies).   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    Post-operative Diagnosis: same   Operation: Robotic-assisted laparoscopic total hysterectomy with bilateral salpingoophorectomy, omentectomy, radical tumor debulking     Operative Findings:  : bulky right ovarian mass (approximately 6cm), mild nodularity to left ovary, tumor nodules on peritoneum of pelvis (all resected), no gross omental disease. No gross residual tumor at the completion of the procedure representing a complete (optimal) cytoreduction   01/10/2020 Pathology Results   Clinical History: Ovarian cancer ([facility])   FINAL MICROSCOPIC DIAGNOSIS:   A. UTERUS, CERVIX, BILATERAL TUBES AND OVARIES:  - Right ovary:       Microscopic foci of adenocarcinoma associated with extensive necrosis and hemosiderin deposition.       No ovarian surface involvement identified.       Paraovarian necrotic and hemorrhagic nodule.       See oncology table and comment.  - Left Fallopian tube:       Microscopic focus of adenocarcinoma involving submucosa.  See comment.  - Cervix:       Nabothian cyst.       No dysplasia or malignancy.  - Endometrium:       Inactive.       No hyperplasia or malignancy.  - Right Fallopian tube:       Adhesions with focal hemosiderin deposition.       No malignancy identified.  - Left ovary:       Endosalpingosis.       No malignancy identified.   B. PERITONEAL NODULE, BIOPSY:  - Necrotic and hemorrhagic nodule with hemosiderin deposition.  - No viable malignancy identified.   C. OMENTUM:  - Foci of fibrosis associated with adhesions and hemosiderin deposition.  - No malignancy identified.  - One benign lymph node (0/1).   ONCOLOGY TABLE:  OVARY or FALLOPIAN TUBE or PRIMARY PERITONEUM: Resection  Procedure: Hysterectomy with bilateral salpingo-oophorectomy, peritoneal nodule biopsy and omentectomy.   Specimen Integrity: Intact.  Tumor Site: Right ovary.  Tumor Size: 5.2 x 4.5 x 3.8 cm, see comment.  Histologic Type: Endometrioid adenocarcinoma.  Histologic Grade: FIGO grade 2.  Ovarian Surface Involvement: Not identified.  Fallopian Tube Surface Involvement: Not identified.  See comment.  Other Tissue/ Organ Involvement: Not identified.  Peritoneal/Ascitic Fluid Involvement: Not applicable.  Chemotherapy Response Score (CRS): Near complete response, CRS 3.  See  comment.  Regional Lymph Nodes: No lymph nodes submitted.  Distant Metastasis:       Distant Site(s) Involved: Not identified.  Pathologic Stage Classification (pTNM, AJCC 8th Edition): ypT2a, ypNX  Ancillary Studies: Can be performed if requested.  Representative Tumor Block: A6 and A7.    02/29/2020 Tumor Marker   Patient's tumor was tested for the following markers: CA-125 Results of the tumor marker test revealed 14.2   03/02/2020 Genetic Testing   Negative hereditary cancer genetic testing: no pathogenic variants detected in [facility] CustomNext-Cancer +RNAinsight Panel.  The report date is March 02, 2020.  HRD testing was unable to be performed due to insufficient sample availability.   The CustomNext-Cancer+RNAinsight panel offered by [employer] includes sequencing and rearrangement analysis for the following 47 genes:  APC, ATM, AXIN2, BARD1, BMPR1A, BRCA1, BRCA2, BRIP1, CDH1, CDK4, CDKN2A, CHEK2, DICER1, EPCAM, GREM1, HOXB13, MEN1, MLH1, MSH2, MSH3, MSH6, MUTYH, NBN, NF1, NF2, NTHL1, PALB2, PMS2, POLD1, POLE, PTEN, RAD51C, RAD51D, RECQL, RET, SDHA, SDHAF2, SDHB, SDHC, SDHD, SMAD4, SMARCA4, STK11, TP53, TSC1, TSC2, and VHL.  RNA data is routinely analyzed for use in variant interpretation for all genes.   03/21/2020 Tumor Marker   Patient's tumor was tested for the following markers: CA-125 Results of the tumor marker test revealed 11.5   04/20/2020 Tumor Marker   Patient's tumor was tested for the following  markers: CA-125 Results of the tumor marker test revealed 9.3   04/24/2020 Imaging   1. Status post interval hysterectomy and oophorectomy. 2. Interval resolution of previously seen small volume ascites throughout the abdomen and pelvis. There is minimal peritoneal thickening, for example in the right paracolic gutter. 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 3. Stable small pulmonary nodules of the left lung base, measuring 3 mm, most likely incidental and benign. Continued attention on follow-up.     10/24/2020 Tumor Marker   Patient's tumor was tested for the following markers: CA-125. Results of the tumor marker test revealed 9.   01/23/2021 Tumor Marker   Patient's tumor was tested for the following markers: CA-125. Results of the tumor marker test  revealed 8.4.   05/16/2021 Tumor Marker   Patient's tumor was tested for the following markers: CA-125. Results of the tumor marker test revealed 9.4.    Tumor Marker   Patient's tumor was tested for the following markers: CA-125. Results of the tumor marker test revealed 9.5.   11/28/2021 Imaging   Chest Impression:   1. No evidence of thoracic metastasis. 2. Three  small LEFT lobe pulmonary nodules unchanged from 2021   Abdomen / Pelvis Impression:   1. No evidence of ovarian carcinoma local recurrence. 2. No evidence of metastatic disease. 3. No free fluid or peritoneal nodularity.   03/05/2022 Tumor Marker   Patient's tumor was tested for the following markers: CA-125. Results of the tumor marker test revealed 9.1.   05/29/2022 Tumor Marker   Patient's tumor was tested for the following markers: CA-125. Results of the tumor marker test revealed 9.     PHYSICAL EXAMINATION: ECOG PERFORMANCE STATUS: 0 - Asymptomatic  Vitals:   06/03/22 0910  BP: (!) 147/84  Pulse: 62  Resp: 18  Temp: 98.2 F (36.8 C)  SpO2: 99%   Filed Weights   06/03/22 0910  Weight: 161 lb 9.6  oz (73.3 kg)    GENERAL:alert, no distress and comfortable SKIN: skin color, texture, turgor are normal, no rashes or significant lesions EYES: normal, Conjunctiva are pink and non-injected, sclera clear OROPHARYNX:no exudate, no erythema and lips, buccal mucosa, and tongue normal  NECK: supple, thyroid normal size, non-tender, without nodularity LYMPH:  no palpable lymphadenopathy in the cervical, axillary or inguinal LUNGS: clear to auscultation and percussion with normal breathing effort HEART: regular rate & rhythm and no murmurs and no lower extremity edema ABDOMEN:abdomen soft, non-tender and normal bowel sounds Musculoskeletal:no cyanosis of digits and no clubbing  NEURO: alert & oriented x 3 with fluent speech, no focal motor/sensory deficits  LABORATORY DATA:  I have reviewed the data as listed    Component Value Date/Time   NA 142 05/27/2022 0855   NA 142 02/28/2019 1529   K 4.0 05/27/2022 0855   CL 105 05/27/2022 0855   CO2 32 05/27/2022 0855   GLUCOSE 58 (L) 05/27/2022 0855   BUN 15 05/27/2022 0855   BUN 14 02/28/2019 1529   CREATININE 1.04 (H) 05/27/2022 0855   CREATININE 0.85 10/24/2020 1121   CREATININE 0.79 05/28/2019 1156   CALCIUM 9.5 05/27/2022 0855   PROT 7.3 05/27/2022 0855   PROT 6.7 02/28/2019 1529   ALBUMIN 4.6 05/27/2022 0855   ALBUMIN 4.3 02/28/2019 1529   AST 21 05/27/2022 0855   AST 24 10/24/2020 1121   ALT 15 05/27/2022 0855   ALT 18 10/24/2020 1121   ALKPHOS 63 05/27/2022 0855   BILITOT 0.4 05/27/2022 0855   BILITOT 0.6 10/24/2020 1121   GFRNONAA >60 05/27/2022 0855   GFRNONAA >60 10/24/2020 1121   GFRNONAA 81 05/28/2019 1156   GFRAA 94 05/28/2019 1156    No results found for: "SPEP", "UPEP"  Lab Results  Component Value Date   WBC 3.9 (L) 05/27/2022   NEUTROABS 1.8 05/27/2022   HGB 13.6 05/27/2022   HCT 40.7 05/27/2022   MCV 94.0 05/27/2022   PLT 244 05/27/2022      Chemistry      Component Value Date/Time   NA 142  05/27/2022 0855   NA 142 02/28/2019 1529   K 4.0 05/27/2022 0855   CL 105 05/27/2022 0855   CO2 32 05/27/2022 0855   BUN 15 05/27/2022 0855  BUN 14 02/28/2019 1529   CREATININE 1.04 (H) 05/27/2022 0855   CREATININE 0.85 10/24/2020 1121   CREATININE 0.79 05/28/2019 1156      Component Value Date/Time   CALCIUM 9.5 05/27/2022 0855   ALKPHOS 63 05/27/2022 0855   AST 21 05/27/2022 0855   AST 24 10/24/2020 1121   ALT 15 05/27/2022 0855   ALT 18 10/24/2020 1121   BILITOT 0.4 05/27/2022 0855   BILITOT 0.6 10/24/2020 1121

## 2022-06-23 ENCOUNTER — Ambulatory Visit
Admission: RE | Admit: 2022-06-23 | Discharge: 2022-06-23 | Disposition: A | Discharge status: Home / Self Care | Payer: Commercial Managed Care - PPO | Source: Ambulatory Visit | Attending: Obstetrics & Gynecology | Admitting: Obstetrics & Gynecology

## 2022-06-23 DIAGNOSIS — Z1231 Encounter for screening mammogram for malignant neoplasm of breast: Secondary | ICD-10-CM | POA: Diagnosis not present

## 2022-06-25 ENCOUNTER — Other Ambulatory Visit: Payer: Self-pay | Admitting: Obstetrics & Gynecology

## 2022-06-25 ENCOUNTER — Other Ambulatory Visit (HOSPITAL_COMMUNITY): Payer: Self-pay

## 2022-06-25 DIAGNOSIS — G629 Polyneuropathy, unspecified: Secondary | ICD-10-CM | POA: Diagnosis not present

## 2022-06-25 DIAGNOSIS — R928 Other abnormal and inconclusive findings on diagnostic imaging of breast: Secondary | ICD-10-CM

## 2022-06-25 DIAGNOSIS — G609 Hereditary and idiopathic neuropathy, unspecified: Secondary | ICD-10-CM | POA: Diagnosis not present

## 2022-06-25 DIAGNOSIS — G5600 Carpal tunnel syndrome, unspecified upper limb: Secondary | ICD-10-CM | POA: Diagnosis not present

## 2022-06-25 DIAGNOSIS — L299 Pruritus, unspecified: Secondary | ICD-10-CM | POA: Diagnosis not present

## 2022-06-25 MED ORDER — GABAPENTIN 300 MG PO CAPS
300.0000 mg | ORAL_CAPSULE | Freq: Three times a day (TID) | ORAL | 3 refills | Status: DC
  Filled 2022-06-25 – 2022-07-14 (×5): qty 270, 90d supply, fill #0
  Filled 2022-10-10: qty 270, 90d supply, fill #1
  Filled 2023-01-02 – 2023-01-05 (×2): qty 270, 90d supply, fill #2

## 2022-06-26 DIAGNOSIS — H35373 Puckering of macula, bilateral: Secondary | ICD-10-CM | POA: Diagnosis not present

## 2022-06-27 ENCOUNTER — Ambulatory Visit (INDEPENDENT_AMBULATORY_CARE_PROVIDER_SITE_OTHER): Payer: Commercial Managed Care - PPO

## 2022-06-27 DIAGNOSIS — C561 Malignant neoplasm of right ovary: Secondary | ICD-10-CM | POA: Diagnosis not present

## 2022-06-27 MED ORDER — IOHEXOL 300 MG/ML  SOLN
100.0000 mL | Freq: Once | INTRAMUSCULAR | Status: AC | PRN
  Administered 2022-06-27: 100 mL via INTRAVENOUS

## 2022-06-30 ENCOUNTER — Other Ambulatory Visit (HOSPITAL_COMMUNITY): Payer: Self-pay

## 2022-07-01 ENCOUNTER — Other Ambulatory Visit (HOSPITAL_COMMUNITY): Payer: Self-pay

## 2022-07-01 ENCOUNTER — Other Ambulatory Visit: Payer: Self-pay

## 2022-07-01 ENCOUNTER — Encounter: Payer: Self-pay | Admitting: Hematology and Oncology

## 2022-07-01 ENCOUNTER — Inpatient Hospital Stay: Payer: Commercial Managed Care - PPO | Attending: Hematology and Oncology | Admitting: Hematology and Oncology

## 2022-07-01 VITALS — BP 127/87 | HR 66 | Temp 98.4°F | Resp 18 | Ht 63.0 in | Wt 159.4 lb

## 2022-07-01 DIAGNOSIS — Z8543 Personal history of malignant neoplasm of ovary: Secondary | ICD-10-CM | POA: Insufficient documentation

## 2022-07-01 DIAGNOSIS — Z800 Family history of malignant neoplasm of digestive organs: Secondary | ICD-10-CM

## 2022-07-01 DIAGNOSIS — C561 Malignant neoplasm of right ovary: Secondary | ICD-10-CM | POA: Diagnosis not present

## 2022-07-01 DIAGNOSIS — R918 Other nonspecific abnormal finding of lung field: Secondary | ICD-10-CM

## 2022-07-01 NOTE — Assessment & Plan Note (Signed)
Her dad who is 64 years old is a colon cancer survivor The patient noticed some intermittent changes in bowel habits I recommend follow-up with gastroenterologist for management

## 2022-07-01 NOTE — Assessment & Plan Note (Signed)
This is likely benign in nature I recommend discontinuation of imaging study

## 2022-07-01 NOTE — Progress Notes (Signed)
[facility] OFFICE PROGRESS NOTE  Patient Care Team: Cleatis Polka., MD as PCP - General (Internal Medicine) Paulina Fusi Servando Snare, RN as Oncology Nurse Navigator (Oncology)  ASSESSMENT & PLAN:  Ovarian cancer [facility]) Repeat imaging study showed no evidence of disease We discussed future follow-up I recommend tumor marker monitoring only and reserve CT imaging to be done only if she have signs or symptoms of cancer recurrence The patient is aware to watch out for the symptoms She will continue GYN follow-up in the few months Plan to see her once a year  Multiple lung nodules on CT This is likely benign in nature I recommend discontinuation of imaging study  Family history of colon cancer Her dad who is 32 years old is a colon cancer survivor The patient noticed some intermittent changes in bowel habits I recommend follow-up with gastroenterologist for management  No orders of the defined types were placed in this encounter.   All questions were answered. The patient knows to call the clinic with any problems, questions or concerns. The total time spent in the appointment was 30 minutes encounter with patients including review of chart and various tests results, discussions about plan of care and coordination of care plan   Artis Delay, MD 07/01/2022 11:37 AM  INTERVAL HISTORY: Please see below for problem oriented charting. she returns for surveillance follow-up She is here accompanied by her husband We reviewed imaging studies and discussed future follow-up She is doing well except for 2-3 bowel movement every morning instead of 1 regular bowel movement This is no different over the past 3 years but she is wondering whether she should see a gastroenterologist Otherwise, she remain active She was seen by neurologist for neuropathy but it does not bother her too much  REVIEW OF SYSTEMS:   Constitutional: Denies fevers, chills or abnormal weight loss Eyes: Denies  blurriness of vision Ears, nose, mouth, throat, and face: Denies mucositis or sore throat Respiratory: Denies cough, dyspnea or wheezes Cardiovascular: Denies palpitation, chest discomfort or lower extremity swelling Gastrointestinal:  Denies nausea, heartburn or change in bowel habits Skin: Denies abnormal skin rashes Lymphatics: Denies new lymphadenopathy or easy bruising Neurological:Denies numbness, tingling or new weaknesses Behavioral/Psych: Mood is stable, no new changes  All other systems were reviewed with the patient and are negative.  I have reviewed the past medical history, past surgical history, social history and family history with the patient and they are unchanged from previous note.  ALLERGIES:  has No Known Allergies.  MEDICATIONS:  Current Outpatient Medications  Medication Sig Dispense Refill   Alpha-Lipoic Acid 300 MG CAPS Take 1-2 capsules (300-600 mg total) by mouth once daily 60 capsule 11   cetirizine (ZYRTEC) 10 MG tablet Chew 10 mg by mouth daily.     Cholecalciferol 50 MCG (2000 UT) CAPS Take 2,000 Units by mouth daily.     cyanocobalamin (VITAMIN B12) 1000 MCG tablet Take 1,000 mcg by mouth daily.     fluticasone (FLONASE) 50 MCG/ACT nasal spray Place 1 spray into both nostrils daily.     gabapentin (NEURONTIN) 300 MG capsule Take 1 capsule (300 mg total) by mouth 3 (three) times daily. 270 capsule 3   melatonin 5 MG TABS Take 5 mg by mouth.     No current facility-administered medications for this visit.    SUMMARY OF ONCOLOGIC HISTORY: Oncology History Overview Note  Neg Genetics, unable to do HRD   Ovarian cancer (HCC)  09/20/2019 Initial Diagnosis  Presented to her primary care doctor with abdominal bloating.  Abdominal x-ray imaging was unremarkable.   09/29/2019 Imaging   CT of the chest showed pulmonary embolism in all lobes.  Large right/central pelvic mass likely ovarian neoplasm with omental/peritoneal metastasis with significant free fluid  in the abdomen and pelvis.  A small left uterine calcification could be seen in a degenerating fibroid.  Small right and left lung base nodules, could represent sequela of previous infection or inflammation with metastasis not excluded.   09/29/2019 - 10/15/2019 Hospital Admission   She presented to the emergency department in [place] after complaints of leg pain with bilateral lower extremity edema.  D-dimer was elevated.  Lower extremity ultrasound demonstrated extensive bilateral DVT involving peroneal and soleal calf veins on the left and peroneal vein on the right.  CT abdomen and pelvis showed large pelvic mass with omental metastasis and ascites, worrisome for ovarian cancer.  CT imaging of the chest showed bilateral pulmonary nodules as well as evidence of pulmonary embolism.  She received anticoagulation therapy.  She was also found to be anemic, worrisome that she might have bleeding in the tumor and received transfusion.  Echocardiogram showed preserved ejection fraction but evidence of moderate right ventricular strain.  Subsequent biopsy showed cancer suspicious for ovarian primary and she received first cycle of neoadjuvant chemotherapy with carboplatin and taxol. She had IVC filter placement and temporary TPN   09/30/2019 Procedure   Ultrasound paracentesis was performed complicated by bleeding and received blood transfusion   09/30/2019 Procedure   She had IVC filter placement by interventional radiologist in [facility].   09/30/2019 Echocardiogram   Outside echocardiogram showed left ventricular ejection fraction around 79%.  Concentric left ventricular modeling.  Grade 1 diastolic pattern with normal left atrial pressure.  Right ventricle size is mildly enlarged.  Right ventricular systolic function is moderately reduced.  Right ventricle was not well visualized.  Aortic sclerosis without evidence of stenosis.  Moderate pleural effusion on the left region   10/02/2019 Procedure   EGD was performed  which showed no evidence of acute bleeding.  Biopsy of the stomach showed benign gastric mucosa with reactive gastropathy as may be seen in the healing phase of erosive gastritis.  Helicobacter organism testing is negative..   10/03/2019 Procedure   She underwent CT-guided biopsy of the pelvic mass.   10/03/2019 Tumor Marker   CEA level was 2.9.   10/03/2019 Pathology Results   Biopsy show adenocarcinoma.  The malignant cells stain positive for keratin AE1/AE3, CK7 and MOC-31.  Rare cells show weak reactivity with p63 and GA TA 3.  Cells are negative for CK20, calretinin, CDX2, TTF-1, ER and WT-1.  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  Possible consideration include, but not limited to, gynecological origin, pancreatico biliary and urothelial.   10/03/2019 Tumor Marker   Patient's tumor was tested for the following markers: CA-125 Results of the tumor marker test revealed 713 CA19-9 is 100   10/04/2019 Imaging   Tagged red blood cell scan equivocal for source of bleeding.   10/05/2019 Procedure   She underwent paracentesis.  IR removed 4100 cc of ascites fluid with significant blood noted.   10/05/2019 Imaging   CT abdomen and pelvis angiogram protocol show no obvious source of bleeding.   10/05/2019 Imaging   CT imaging of the abdomen and pelvis showed no evidence of postprocedural hemorrhage or discrete area of GI bleeding.  Large volume ascites is  present.  Small pockets of intraperitoneal free air are present, likely related to recent paracentesis.  Abnormal mass in the pelvic region most likely a neoplasm of uterine or ovarian origin.  Normal liver with benign cysts of the right hepatic dome.  Biliary tree and pancreas and spleen were age-appropriate   10/07/2019 Procedure   Ultrasound paracentesis was performed.   10/07/2019 -  Chemotherapy   The patient had neoadjuvant carboplatin and taxol for  chemotherapy treatment.     10/08/2019 Imaging   CT angiogram of the abdominal aorta and iliofemoral test was done showing 14 x 10 x 14 cm pelvic mass with mild to moderate ascites.  Normal appearance of arterial structures without evidence of compression.  No evidence of compression of the iliac vein or IVC.   10/11/2019 Imaging   Ultrasound shows gallbladder full of sludge.  Fatty liver changes.  Ascites present.   10/18/2019 Cancer Staging   Staging form: Ovary, Fallopian Tube, and Primary Peritoneal Carcinoma, AJCC 8th Edition - Clinical stage from 10/18/2019: FIGO Stage IIIC, calculated as Stage Unknown (cT3c, cNX, cM0) - Signed by Artis Delay, MD on 10/18/2019   10/21/2019 Tumor Marker   Patient's tumor was tested for the following markers: CA-125. Results of the tumor marker test revealed 257.   10/31/2019 - 03/21/2020 Chemotherapy   The patient had carboplatin and taxol for chemotherapy treatment.     11/22/2019 Tumor Marker   Patient's tumor was tested for the following markers: CA-125. Results of the tumor marker test revealed 30.7   12/12/2019 Imaging   1. Today's study demonstrates a positive response to therapy with regression of right ovarian lesion(s), and decreased volume of presumably malignant ascites. 2. No definitive evidence of metastatic disease in the thorax. Multiple tiny 2-4 mm pulmonary nodules are stable compared to prior examinations. Continued attention on follow-up studies is recommended to exclude the possibility of metastatic lesions    12/26/2019 Tumor Marker   Patient's tumor was tested for the following markers: CA-125 Results of the tumor marker test revealed 16.4   01/10/2020 Surgery   Surgeon: Quinn Axe    Assistants: Dr Antionette Char (an MD assistant was necessary for tissue manipulation, management of robotic instrumentation, retraction and positioning due to the complexity of the case and hospital policies).  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    Post-operative Diagnosis: same   Operation: Robotic-assisted laparoscopic total hysterectomy with bilateral salpingoophorectomy, omentectomy, radical tumor debulking     Operative Findings:  : bulky right ovarian mass (approximately 6cm), mild nodularity to left ovary, tumor nodules on peritoneum of pelvis (all resected), no gross omental disease. No gross residual tumor at the completion of the procedure representing a complete (optimal) cytoreduction   01/10/2020 Pathology Results   Clinical History: Ovarian cancer ([facility])   FINAL MICROSCOPIC DIAGNOSIS:   A. UTERUS, CERVIX, BILATERAL TUBES AND OVARIES:  - Right ovary:       Microscopic foci of adenocarcinoma associated with extensive necrosis and hemosiderin deposition.       No ovarian surface involvement identified.       Paraovarian necrotic and hemorrhagic nodule.       See oncology table and comment.  - Left Fallopian tube:       Microscopic focus of adenocarcinoma involving submucosa.       See comment.  - Cervix:       Nabothian cyst.  No dysplasia or malignancy.  - Endometrium:       Inactive.       No hyperplasia or malignancy.  - Right Fallopian tube:       Adhesions with focal hemosiderin deposition.       No malignancy identified.  - Left ovary:       Endosalpingosis.       No malignancy identified.   B. PERITONEAL NODULE, BIOPSY:  - Necrotic and hemorrhagic nodule with hemosiderin deposition.  - No viable malignancy identified.   C. OMENTUM:  - Foci of fibrosis associated with adhesions and hemosiderin deposition.  - No malignancy identified.  - One benign lymph node (0/1).   ONCOLOGY TABLE:  OVARY or FALLOPIAN TUBE or PRIMARY PERITONEUM: Resection  Procedure: Hysterectomy with bilateral salpingo-oophorectomy, peritoneal nodule biopsy and omentectomy.  Specimen  Integrity: Intact.  Tumor Site: Right ovary.  Tumor Size: 5.2 x 4.5 x 3.8 cm, see comment.  Histologic Type: Endometrioid adenocarcinoma.  Histologic Grade: FIGO grade 2.  Ovarian Surface Involvement: Not identified.  Fallopian Tube Surface Involvement: Not identified.  See comment.  Other Tissue/ Organ Involvement: Not identified.  Peritoneal/Ascitic Fluid Involvement: Not applicable.  Chemotherapy Response Score (CRS): Near complete response, CRS 3.  See  comment.  Regional Lymph Nodes: No lymph nodes submitted.  Distant Metastasis:       Distant Site(s) Involved: Not identified.  Pathologic Stage Classification (pTNM, AJCC 8th Edition): ypT2a, ypNX  Ancillary Studies: Can be performed if requested.  Representative Tumor Block: A6 and A7.    02/29/2020 Tumor Marker   Patient's tumor was tested for the following markers: CA-125 Results of the tumor marker test revealed 14.2   03/02/2020 Genetic Testing   Negative hereditary cancer genetic testing: no pathogenic variants detected in [facility]Next-Cancer +RNAinsight Panel.  The report date is March 02, 2020.  HRD testing was unable to be performed due to insufficient sample availability.   The CustomNext-Cancer+RNAinsight panel offered by [employer] includes sequencing and rearrangement analysis for the following 47 genes:  APC, ATM, AXIN2, BARD1, BMPR1A, BRCA1, BRCA2, BRIP1, CDH1, CDK4, CDKN2A, CHEK2, DICER1, EPCAM, GREM1, HOXB13, MEN1, MLH1, MSH2, MSH3, MSH6, MUTYH, NBN, NF1, NF2, NTHL1, PALB2, PMS2, POLD1, POLE, PTEN, RAD51C, RAD51D, RECQL, RET, SDHA, SDHAF2, SDHB, SDHC, SDHD, SMAD4, SMARCA4, STK11, TP53, TSC1, TSC2, and VHL.  RNA data is routinely analyzed for use in variant interpretation for all genes.   03/21/2020 Tumor Marker   Patient's tumor was tested for the following markers: CA-125 Results of the tumor marker test revealed 11.5   04/20/2020 Tumor Marker   Patient's tumor was tested for the following markers:  CA-125 Results of the tumor marker test revealed 9.3   04/24/2020 Imaging   1. Status post interval hysterectomy and oophorectomy. 2. Interval resolution of previously seen small volume ascites throughout the abdomen and pelvis. There is minimal peritoneal thickening, for example in the right paracolic gutter. 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 3. Stable small pulmonary nodules of the left lung base, measuring 3 mm, most likely incidental and benign. Continued attention on follow-up.     10/24/2020 Tumor Marker   Patient's tumor was tested for the following markers: CA-125. Results of the tumor marker test revealed 9.   01/23/2021 Tumor Marker   Patient's tumor was tested for the following markers: CA-125. Results of the tumor marker test revealed 8.4.   05/16/2021 Tumor Marker   Patient's tumor was tested for the following markers: CA-125. Results  of the tumor marker test revealed 9.4.    Tumor Marker   Patient's tumor was tested for the following markers: CA-125. Results of the tumor marker test revealed 9.5.   11/28/2021 Imaging   Chest Impression:   1. No evidence of thoracic metastasis. 2. Three  small LEFT lobe pulmonary nodules unchanged from 2021   Abdomen / Pelvis Impression:   1. No evidence of ovarian carcinoma local recurrence. 2. No evidence of metastatic disease. 3. No free fluid or peritoneal nodularity.   03/05/2022 Tumor Marker   Patient's tumor was tested for the following markers: CA-125. Results of the tumor marker test revealed 9.1.   05/29/2022 Tumor Marker   Patient's tumor was tested for the following markers: CA-125. Results of the tumor marker test revealed 9.   07/01/2022 Imaging   CT ABDOMEN PELVIS W CONTRAST  Result Date: 06/30/2022 CLINICAL DATA:  Evaluate for ovarian cancer recurrence. * Tracking Code: BO * EXAM: CT ABDOMEN AND PELVIS WITH CONTRAST TECHNIQUE: Multidetector CT imaging of the  abdomen and pelvis was performed using the standard protocol following bolus administration of intravenous contrast. RADIATION DOSE REDUCTION: This exam was performed according to the departmental dose-optimization program which includes automated exposure control, adjustment of the mA and/or kV according to patient size and/or use of iterative reconstruction technique. CONTRAST:  OMNIPAQUE IOHEXOL 300 MG/ML  SOLN COMPARISON:  11/27/2021 FINDINGS: Lower chest: No acute abnormality. There are 2 peripheral subpleural nodules in the lower lobe which are unchanged from previous exam. The largest measures 4 mm, image 9/4. Hepatobiliary: Unchanged cyst within dome of liver measuring 1.4 cm, image 7/2. No suspicious liver lesions. Gallbladder appears normal. No bile duct dilatation. Pancreas: Unremarkable. No pancreatic ductal dilatation or surrounding inflammatory changes. Spleen: Normal in size without focal abnormality. Adrenals/Urinary Tract: Normal adrenal glands. No nephrolithiasis, hydronephrosis or suspicious mass. Urinary bladder is unremarkable. Stomach/Bowel: Stomach appears normal. No pathologic dilatation of the large or small bowel loops. No bowel wall thickening or inflammation. Vascular/Lymphatic: Aortic atherosclerosis. No enlarged abdominal or pelvic lymph nodes. Reproductive: Status post hysterectomy. No adnexal masses. Other: There is no ascites or focal fluid collections. There is no peritoneal nodularity or mass. Musculoskeletal: Degenerative disc disease is noted L3-4 and L4-5. There is an anterolisthesis L4 on L5 measuring 5 mm. No acute or suspicious osseous findings. IMPRESSION: 1. No acute findings within the abdomen or pelvis. 2. No specific findings identified to suggest residual or recurrent tumor or metastatic disease. 3. Small peripheral subpleural nodules in the lower lobe are unchanged from previous exam. These are favored to represent a benign abnormality. 4. Lumbar spondylosis with  anterolisthesis L4 on L5. 5.  Aortic Atherosclerosis (ICD10-I70.0). Electronically Signed   By: Signa Kell M.D.   On: 06/30/2022 16:19   MM 3D SCREENING MAMMOGRAM BILATERAL BREAST  Result Date: 06/24/2022 CLINICAL DATA:  Screening. EXAM: DIGITAL SCREENING BILATERAL MAMMOGRAM WITH TOMOSYNTHESIS AND CAD TECHNIQUE: Bilateral screening digital craniocaudal and mediolateral oblique mammograms were obtained. Bilateral screening digital breast tomosynthesis was performed. The images were evaluated with computer-aided detection. COMPARISON:  Previous exam(s). ACR Breast Density Category b: There are scattered areas of fibroglandular density. FINDINGS: In the right breast, a possible asymmetry warrants further evaluation. In the left breast, no findings suspicious for malignancy. IMPRESSION: Further evaluation is suggested for possible asymmetry in the right breast. RECOMMENDATION: Diagnostic mammogram and possibly ultrasound of the right breast. (Code:[id number]) The patient will be contacted regarding the findings, and additional imaging will be scheduled. BI-RADS CATEGORY  0: Incomplete: Need additional imaging evaluation. Electronically Signed   By: Frederico Hamman M.D.   On: 06/24/2022 15:47        PHYSICAL EXAMINATION: ECOG PERFORMANCE STATUS: 0 - Asymptomatic  Vitals:   07/01/22 1017  BP: 127/87  Pulse: 66  Resp: 18  Temp: 98.4 F (36.9 C)  SpO2: 97%   Filed Weights   07/01/22 1017  Weight: 159 lb 6.4 oz (72.3 kg)    GENERAL:alert, no distress and comfortable NEURO: alert & oriented x 3 with fluent speech, no focal motor/sensory deficits  LABORATORY DATA:  I have reviewed the data as listed    Component Value Date/Time   NA 142 05/27/2022 0855   NA 142 02/28/2019 1529   K 4.0 05/27/2022 0855   CL 105 05/27/2022 0855   CO2 32 05/27/2022 0855   GLUCOSE 58 (L) 05/27/2022 0855   BUN 15 05/27/2022 0855   BUN 14 02/28/2019 1529   CREATININE 1.04 (H) 05/27/2022 0855   CREATININE  0.85 10/24/2020 1121   CREATININE 0.79 05/28/2019 1156   CALCIUM 9.5 05/27/2022 0855   PROT 7.3 05/27/2022 0855   PROT 6.7 02/28/2019 1529   ALBUMIN 4.6 05/27/2022 0855   ALBUMIN 4.3 02/28/2019 1529   AST 21 05/27/2022 0855   AST 24 10/24/2020 1121   ALT 15 05/27/2022 0855   ALT 18 10/24/2020 1121   ALKPHOS 63 05/27/2022 0855   BILITOT 0.4 05/27/2022 0855   BILITOT 0.6 10/24/2020 1121   GFRNONAA >60 05/27/2022 0855   GFRNONAA >60 10/24/2020 1121   GFRNONAA 81 05/28/2019 1156   GFRAA 94 05/28/2019 1156    No results found for: "SPEP", "UPEP"  Lab Results  Component Value Date   WBC 3.9 (L) 05/27/2022   NEUTROABS 1.8 05/27/2022   HGB 13.6 05/27/2022   HCT 40.7 05/27/2022   MCV 94.0 05/27/2022   PLT 244 05/27/2022      Chemistry      Component Value Date/Time   NA 142 05/27/2022 0855   NA 142 02/28/2019 1529   K 4.0 05/27/2022 0855   CL 105 05/27/2022 0855   CO2 32 05/27/2022 0855   BUN 15 05/27/2022 0855   BUN 14 02/28/2019 1529   CREATININE 1.04 (H) 05/27/2022 0855   CREATININE 0.85 10/24/2020 1121   CREATININE 0.79 05/28/2019 1156      Component Value Date/Time   CALCIUM 9.5 05/27/2022 0855   ALKPHOS 63 05/27/2022 0855   AST 21 05/27/2022 0855   AST 24 10/24/2020 1121   ALT 15 05/27/2022 0855   ALT 18 10/24/2020 1121   BILITOT 0.4 05/27/2022 0855   BILITOT 0.6 10/24/2020 1121       RADIOGRAPHIC STUDIES: I have reviewed imaging studies with the patient I have personally reviewed the radiological images as listed and agreed with the findings in the report. CT ABDOMEN PELVIS W CONTRAST  Result Date: 06/30/2022 CLINICAL DATA:  Evaluate for ovarian cancer recurrence. * Tracking Code: BO * EXAM: CT ABDOMEN AND PELVIS WITH CONTRAST TECHNIQUE: Multidetector CT imaging of the abdomen and pelvis was performed using the standard protocol following bolus administration of intravenous contrast. RADIATION DOSE REDUCTION: This exam was performed according to the  departmental dose-optimization program which includes automated exposure control, adjustment of the mA and/or kV according to patient size and/or use of iterative reconstruction technique. CONTRAST:  OMNIPAQUE IOHEXOL 300 MG/ML  SOLN COMPARISON:  11/27/2021 FINDINGS: Lower chest: No acute abnormality. There are 2 peripheral subpleural nodules in the  lower lobe which are unchanged from previous exam. The largest measures 4 mm, image 9/4. Hepatobiliary: Unchanged cyst within dome of liver measuring 1.4 cm, image 7/2. No suspicious liver lesions. Gallbladder appears normal. No bile duct dilatation. Pancreas: Unremarkable. No pancreatic ductal dilatation or surrounding inflammatory changes. Spleen: Normal in size without focal abnormality. Adrenals/Urinary Tract: Normal adrenal glands. No nephrolithiasis, hydronephrosis or suspicious mass. Urinary bladder is unremarkable. Stomach/Bowel: Stomach appears normal. No pathologic dilatation of the large or small bowel loops. No bowel wall thickening or inflammation. Vascular/Lymphatic: Aortic atherosclerosis. No enlarged abdominal or pelvic lymph nodes. Reproductive: Status post hysterectomy. No adnexal masses. Other: There is no ascites or focal fluid collections. There is no peritoneal nodularity or mass. Musculoskeletal: Degenerative disc disease is noted L3-4 and L4-5. There is an anterolisthesis L4 on L5 measuring 5 mm. No acute or suspicious osseous findings. IMPRESSION: 1. No acute findings within the abdomen or pelvis. 2. No specific findings identified to suggest residual or recurrent tumor or metastatic disease. 3. Small peripheral subpleural nodules in the lower lobe are unchanged from previous exam. These are favored to represent a benign abnormality. 4. Lumbar spondylosis with anterolisthesis L4 on L5. 5.  Aortic Atherosclerosis (ICD10-I70.0). Electronically Signed   By: Signa Kell M.D.   On: 06/30/2022 16:19   MM 3D SCREENING MAMMOGRAM BILATERAL  BREAST  Result Date: 06/24/2022 CLINICAL DATA:  Screening. EXAM: DIGITAL SCREENING BILATERAL MAMMOGRAM WITH TOMOSYNTHESIS AND CAD TECHNIQUE: Bilateral screening digital craniocaudal and mediolateral oblique mammograms were obtained. Bilateral screening digital breast tomosynthesis was performed. The images were evaluated with computer-aided detection. COMPARISON:  Previous exam(s). ACR Breast Density Category b: There are scattered areas of fibroglandular density. FINDINGS: In the right breast, a possible asymmetry warrants further evaluation. In the left breast, no findings suspicious for malignancy. IMPRESSION: Further evaluation is suggested for possible asymmetry in the right breast. RECOMMENDATION: Diagnostic mammogram and possibly ultrasound of the right breast. (Code:[id number]) The patient will be contacted regarding the findings, and additional imaging will be scheduled. BI-RADS CATEGORY  0: Incomplete: Need additional imaging evaluation. Electronically Signed   By: Frederico Hamman M.D.   On: 06/24/2022 15:47

## 2022-07-01 NOTE — Assessment & Plan Note (Signed)
Repeat imaging study showed no evidence of disease We discussed future follow-up I recommend tumor marker monitoring only and reserve CT imaging to be done only if she have signs or symptoms of cancer recurrence The patient is aware to watch out for the symptoms She will continue GYN follow-up in the few months Plan to see her once a year

## 2022-07-09 ENCOUNTER — Other Ambulatory Visit (HOSPITAL_COMMUNITY): Payer: Self-pay

## 2022-07-11 ENCOUNTER — Ambulatory Visit: Payer: Commercial Managed Care - PPO

## 2022-07-11 ENCOUNTER — Ambulatory Visit
Admission: RE | Admit: 2022-07-11 | Discharge: 2022-07-11 | Disposition: A | Discharge status: Home / Self Care | Payer: Commercial Managed Care - PPO | Source: Ambulatory Visit | Attending: Obstetrics & Gynecology | Admitting: Obstetrics & Gynecology

## 2022-07-11 DIAGNOSIS — R928 Other abnormal and inconclusive findings on diagnostic imaging of breast: Secondary | ICD-10-CM | POA: Diagnosis not present

## 2022-07-14 ENCOUNTER — Other Ambulatory Visit: Payer: Self-pay

## 2022-07-14 ENCOUNTER — Other Ambulatory Visit (HOSPITAL_COMMUNITY): Payer: Self-pay

## 2022-08-06 ENCOUNTER — Other Ambulatory Visit: Payer: Self-pay | Admitting: Gynecologic Oncology

## 2022-08-06 ENCOUNTER — Telehealth: Payer: Self-pay

## 2022-08-06 DIAGNOSIS — C569 Malignant neoplasm of unspecified ovary: Secondary | ICD-10-CM

## 2022-08-06 NOTE — Telephone Encounter (Signed)
Kristina Flores called office stating she has a follow up appointment in October. She would like to have labs before appointment so results can be discussed at the appointment.   Lab appointment is scheduled for 10/22.  Please place order

## 2022-08-18 ENCOUNTER — Telehealth: Payer: Self-pay

## 2022-08-18 NOTE — Telephone Encounter (Signed)
Patient called in to have her lab and appointment with Dr Pricilla Holm rescheduled.  Rescheduled lab to 10/29 and rescheduled Dr Pricilla Holm to 11/1.  Patient confirmed and understood.

## 2022-09-09 ENCOUNTER — Other Ambulatory Visit (HOSPITAL_COMMUNITY): Payer: Self-pay

## 2022-09-09 MED ORDER — PREDNISONE 10 MG (21) PO TBPK
ORAL_TABLET | ORAL | 0 refills | Status: DC
  Filled 2022-09-09: qty 21, 6d supply, fill #0

## 2022-09-09 MED ORDER — AZITHROMYCIN 250 MG PO TABS
ORAL_TABLET | ORAL | 0 refills | Status: DC
  Filled 2022-09-09: qty 6, 5d supply, fill #0

## 2022-09-09 MED ORDER — PAXLOVID (300/100) 20 X 150 MG & 10 X 100MG PO TBPK
ORAL_TABLET | ORAL | 0 refills | Status: DC
  Filled 2022-09-09: qty 30, 5d supply, fill #0

## 2022-10-10 ENCOUNTER — Other Ambulatory Visit (HOSPITAL_COMMUNITY): Payer: Self-pay

## 2022-10-28 ENCOUNTER — Telehealth: Payer: Self-pay

## 2022-10-28 NOTE — Telephone Encounter (Signed)
Pt scheduled on 11/14/22, appointment time changed from 1:00 to 12:00. Pt agreed to new time

## 2022-10-30 DIAGNOSIS — Z0000 Encounter for general adult medical examination without abnormal findings: Secondary | ICD-10-CM | POA: Diagnosis not present

## 2022-10-30 DIAGNOSIS — Z0189 Encounter for other specified special examinations: Secondary | ICD-10-CM | POA: Diagnosis not present

## 2022-10-30 DIAGNOSIS — G629 Polyneuropathy, unspecified: Secondary | ICD-10-CM | POA: Diagnosis not present

## 2022-10-30 DIAGNOSIS — M8580 Other specified disorders of bone density and structure, unspecified site: Secondary | ICD-10-CM | POA: Diagnosis not present

## 2022-11-04 ENCOUNTER — Other Ambulatory Visit: Payer: Commercial Managed Care - PPO

## 2022-11-04 DIAGNOSIS — R82998 Other abnormal findings in urine: Secondary | ICD-10-CM | POA: Diagnosis not present

## 2022-11-04 DIAGNOSIS — Z86711 Personal history of pulmonary embolism: Secondary | ICD-10-CM | POA: Diagnosis not present

## 2022-11-04 DIAGNOSIS — G5623 Lesion of ulnar nerve, bilateral upper limbs: Secondary | ICD-10-CM | POA: Diagnosis not present

## 2022-11-04 DIAGNOSIS — H9313 Tinnitus, bilateral: Secondary | ICD-10-CM | POA: Diagnosis not present

## 2022-11-04 DIAGNOSIS — D171 Benign lipomatous neoplasm of skin and subcutaneous tissue of trunk: Secondary | ICD-10-CM | POA: Diagnosis not present

## 2022-11-04 DIAGNOSIS — M8580 Other specified disorders of bone density and structure, unspecified site: Secondary | ICD-10-CM | POA: Diagnosis not present

## 2022-11-04 DIAGNOSIS — Z23 Encounter for immunization: Secondary | ICD-10-CM | POA: Diagnosis not present

## 2022-11-04 DIAGNOSIS — R030 Elevated blood-pressure reading, without diagnosis of hypertension: Secondary | ICD-10-CM | POA: Diagnosis not present

## 2022-11-04 DIAGNOSIS — G629 Polyneuropathy, unspecified: Secondary | ICD-10-CM | POA: Diagnosis not present

## 2022-11-04 DIAGNOSIS — I700 Atherosclerosis of aorta: Secondary | ICD-10-CM | POA: Diagnosis not present

## 2022-11-04 DIAGNOSIS — Z0000 Encounter for general adult medical examination without abnormal findings: Secondary | ICD-10-CM | POA: Diagnosis not present

## 2022-11-05 ENCOUNTER — Telehealth: Payer: Self-pay

## 2022-11-05 NOTE — Telephone Encounter (Signed)
Patient left message on vm about getting a BMD from outside facility due to [facility] not scheduling them at this time. [facility]. Patient last BMD was done 11/13/20 at [facility]. Please advise.

## 2022-11-06 ENCOUNTER — Ambulatory Visit: Payer: Commercial Managed Care - PPO | Admitting: Gynecologic Oncology

## 2022-11-11 ENCOUNTER — Inpatient Hospital Stay: Payer: Commercial Managed Care - PPO | Attending: Hematology and Oncology

## 2022-11-11 DIAGNOSIS — I824Z3 Acute embolism and thrombosis of unspecified deep veins of distal lower extremity, bilateral: Secondary | ICD-10-CM

## 2022-11-11 DIAGNOSIS — I2699 Other pulmonary embolism without acute cor pulmonale: Secondary | ICD-10-CM

## 2022-11-11 DIAGNOSIS — Z8543 Personal history of malignant neoplasm of ovary: Secondary | ICD-10-CM | POA: Insufficient documentation

## 2022-11-11 DIAGNOSIS — C569 Malignant neoplasm of unspecified ovary: Secondary | ICD-10-CM

## 2022-11-11 DIAGNOSIS — R180 Malignant ascites: Secondary | ICD-10-CM

## 2022-11-11 DIAGNOSIS — R918 Other nonspecific abnormal finding of lung field: Secondary | ICD-10-CM

## 2022-11-11 DIAGNOSIS — D61818 Other pancytopenia: Secondary | ICD-10-CM

## 2022-11-11 LAB — CBC WITH DIFFERENTIAL/PLATELET
Abs Immature Granulocytes: 0 10*3/uL (ref 0.00–0.07)
Basophils Absolute: 0 10*3/uL (ref 0.0–0.1)
Basophils Relative: 1 %
Eosinophils Absolute: 0.1 10*3/uL (ref 0.0–0.5)
Eosinophils Relative: 2 %
HCT: 38.7 % (ref 36.0–46.0)
Hemoglobin: 13.1 g/dL (ref 12.0–15.0)
Immature Granulocytes: 0 %
Lymphocytes Relative: 29 %
Lymphs Abs: 1.4 10*3/uL (ref 0.7–4.0)
MCH: 30.8 pg (ref 26.0–34.0)
MCHC: 33.9 g/dL (ref 30.0–36.0)
MCV: 90.8 fL (ref 80.0–100.0)
Monocytes Absolute: 0.4 10*3/uL (ref 0.1–1.0)
Monocytes Relative: 8 %
Neutro Abs: 2.9 10*3/uL (ref 1.7–7.7)
Neutrophils Relative %: 60 %
Platelets: 236 10*3/uL (ref 150–400)
RBC: 4.26 MIL/uL (ref 3.87–5.11)
RDW: 12.5 % (ref 11.5–15.5)
WBC: 4.8 10*3/uL (ref 4.0–10.5)
nRBC: 0 % (ref 0.0–0.2)

## 2022-11-11 LAB — COMPREHENSIVE METABOLIC PANEL
ALT: 16 U/L (ref 0–44)
AST: 21 U/L (ref 15–41)
Albumin: 4.1 g/dL (ref 3.5–5.0)
Alkaline Phosphatase: 56 U/L (ref 38–126)
Anion gap: 5 (ref 5–15)
BUN: 15 mg/dL (ref 8–23)
CO2: 31 mmol/L (ref 22–32)
Calcium: 9.6 mg/dL (ref 8.9–10.3)
Chloride: 103 mmol/L (ref 98–111)
Creatinine, Ser: 0.94 mg/dL (ref 0.44–1.00)
GFR, Estimated: >60 mL/min (ref >60)
Glucose, Bld: 101 mg/dL — ABNORMAL HIGH (ref 70–99)
Potassium: 4.2 mmol/L (ref 3.5–5.1)
Sodium: 139 mmol/L (ref 135–145)
Total Bilirubin: 0.4 mg/dL (ref 0.3–1.2)
Total Protein: 6.9 g/dL (ref 6.5–8.1)

## 2022-11-12 LAB — CA 125: Cancer Antigen (CA) 125: 9 U/mL (ref 0.0–38.1)

## 2022-11-13 NOTE — Progress Notes (Signed)
Gynecologic Oncology Return Clinic Visit  11/14/22  Reason for Visit: Follow-up in the setting of ovarian cancer   Treatment History: Oncology History Overview Note  Neg Genetics, unable to do HRD   Ovarian cancer (HCC)  09/20/2019 Initial Diagnosis   Presented to her primary care doctor with abdominal bloating.  Abdominal x-ray imaging was unremarkable.   09/29/2019 Imaging   CT of the chest showed pulmonary embolism in all lobes.  Large right/central pelvic mass likely ovarian neoplasm with omental/peritoneal metastasis with significant free fluid in the abdomen and pelvis.  A small left uterine calcification could be seen in a degenerating fibroid.  Small right and left lung base nodules, could represent sequela of previous infection or inflammation with metastasis not excluded.   09/29/2019 - 10/15/2019 Hospital Admission   She presented to the emergency department in [place] after complaints of leg pain with bilateral lower extremity edema.  D-dimer was elevated.  Lower extremity ultrasound demonstrated extensive bilateral DVT involving peroneal and soleal calf veins on the left and peroneal vein on the right.  CT abdomen and pelvis showed large pelvic mass with omental metastasis and ascites, worrisome for ovarian cancer.  CT imaging of the chest showed bilateral pulmonary nodules as well as evidence of pulmonary embolism.  She received anticoagulation therapy.  She was also found to be anemic, worrisome that she might have bleeding in the tumor and received transfusion.  Echocardiogram showed preserved ejection fraction but evidence of moderate right ventricular strain.  Subsequent biopsy showed cancer suspicious for ovarian primary and she received first cycle of neoadjuvant chemotherapy with carboplatin and taxol. She had IVC filter placement and temporary TPN   09/30/2019 Procedure   Ultrasound paracentesis was performed complicated by bleeding and received blood transfusion   09/30/2019  Procedure   She had IVC filter placement by interventional radiologist in [facility].   09/30/2019 Echocardiogram   Outside echocardiogram showed left ventricular ejection fraction around 79%.  Concentric left ventricular modeling.  Grade 1 diastolic pattern with normal left atrial pressure.  Right ventricle size is mildly enlarged.  Right ventricular systolic function is moderately reduced.  Right ventricle was not well visualized.  Aortic sclerosis without evidence of stenosis.  Moderate pleural effusion on the left region   10/02/2019 Procedure   EGD was performed which showed no evidence of acute bleeding.  Biopsy of the stomach showed benign gastric mucosa with reactive gastropathy as may be seen in the healing phase of erosive gastritis.  Helicobacter organism testing is negative..   10/03/2019 Procedure   She underwent CT-guided biopsy of the pelvic mass.   10/03/2019 Tumor Marker   CEA level was 2.9.   10/03/2019 Pathology Results   Biopsy show adenocarcinoma.  The malignant cells stain positive for keratin AE1/AE3, CK7 and MOC-31.  Rare cells show weak reactivity with p63 and GA TA 3.  Cells are negative for CK20, calretinin, CDX2, TTF-1, ER and WT-1.  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  Possible consideration include, but not limited to, gynecological origin, pancreatico biliary and urothelial.   10/03/2019 Tumor Marker   Patient's tumor was tested for the following markers: CA-125 Results of the tumor marker test revealed 713 CA19-9 is 100   10/04/2019 Imaging   Tagged red blood cell scan equivocal for source of bleeding.   10/05/2019 Procedure   She underwent paracentesis.  IR removed 4100 cc of ascites fluid with significant blood noted.   10/05/2019 Imaging  CT abdomen and pelvis angiogram protocol show no obvious source of bleeding.   10/05/2019 Imaging   CT imaging of the abdomen and pelvis  showed no evidence of postprocedural hemorrhage or discrete area of GI bleeding.  Large volume ascites is present.  Small pockets of intraperitoneal free air are present, likely related to recent paracentesis.  Abnormal mass in the pelvic region most likely a neoplasm of uterine or ovarian origin.  Normal liver with benign cysts of the right hepatic dome.  Biliary tree and pancreas and spleen were age-appropriate   10/07/2019 Procedure   Ultrasound paracentesis was performed.   10/07/2019 -  Chemotherapy   The patient had neoadjuvant carboplatin and taxol for chemotherapy treatment.     10/08/2019 Imaging   CT angiogram of the abdominal aorta and iliofemoral test was done showing 14 x 10 x 14 cm pelvic mass with mild to moderate ascites.  Normal appearance of arterial structures without evidence of compression.  No evidence of compression of the iliac vein or IVC.   10/11/2019 Imaging   Ultrasound shows gallbladder full of sludge.  Fatty liver changes.  Ascites present.   10/18/2019 Cancer Staging   Staging form: Ovary, Fallopian Tube, and Primary Peritoneal Carcinoma, AJCC 8th Edition - Clinical stage from 10/18/2019: FIGO Stage IIIC, calculated as Stage Unknown (cT3c, cNX, cM0) - Signed by Artis Delay, MD on 10/18/2019   10/21/2019 Tumor Marker   Patient's tumor was tested for the following markers: CA-125. Results of the tumor marker test revealed 257.   10/31/2019 - 03/21/2020 Chemotherapy   The patient had carboplatin and taxol for chemotherapy treatment.     11/22/2019 Tumor Marker   Patient's tumor was tested for the following markers: CA-125. Results of the tumor marker test revealed 30.7   12/12/2019 Imaging   1. Today's study demonstrates a positive response to therapy with regression of right ovarian lesion(s), and decreased volume of presumably malignant ascites. 2. No definitive evidence of metastatic disease in the thorax. Multiple tiny 2-4 mm pulmonary nodules are stable compared  to prior examinations. Continued attention on follow-up studies is recommended to exclude the possibility of metastatic lesions    12/26/2019 Tumor Marker   Patient's tumor was tested for the following markers: CA-125 Results of the tumor marker test revealed 16.4   01/10/2020 Surgery   Surgeon: Quinn Axe    Assistants: Dr Antionette Char (an MD assistant was necessary for tissue manipulation, management of robotic instrumentation, retraction and positioning due to the complexity of the case and hospital policies).  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    Post-operative Diagnosis: same   Operation: Robotic-assisted laparoscopic total hysterectomy with bilateral salpingoophorectomy, omentectomy, radical tumor debulking     Operative Findings:  : bulky right ovarian mass (approximately 6cm), mild nodularity to left ovary, tumor nodules on peritoneum of pelvis (all resected), no gross omental disease. No gross residual tumor at the completion of the procedure representing a complete (optimal) cytoreduction   01/10/2020 Pathology Results   Clinical History: Ovarian cancer ([facility])   FINAL MICROSCOPIC DIAGNOSIS:   A. UTERUS, CERVIX, BILATERAL TUBES AND OVARIES:  - Right ovary:       Microscopic foci of adenocarcinoma associated with extensive necrosis and hemosiderin deposition.       No ovarian surface involvement identified.       Paraovarian necrotic and hemorrhagic nodule.       See oncology table  and comment.  - Left Fallopian tube:       Microscopic focus of adenocarcinoma involving submucosa.       See comment.  - Cervix:       Nabothian cyst.       No dysplasia or malignancy.  - Endometrium:       Inactive.       No hyperplasia or malignancy.  - Right Fallopian tube:       Adhesions with focal hemosiderin deposition.       No malignancy identified.  - Left  ovary:       Endosalpingosis.       No malignancy identified.   B. PERITONEAL NODULE, BIOPSY:  - Necrotic and hemorrhagic nodule with hemosiderin deposition.  - No viable malignancy identified.   C. OMENTUM:  - Foci of fibrosis associated with adhesions and hemosiderin deposition.  - No malignancy identified.  - One benign lymph node (0/1).   ONCOLOGY TABLE:  OVARY or FALLOPIAN TUBE or PRIMARY PERITONEUM: Resection  Procedure: Hysterectomy with bilateral salpingo-oophorectomy, peritoneal nodule biopsy and omentectomy.  Specimen Integrity: Intact.  Tumor Site: Right ovary.  Tumor Size: 5.2 x 4.5 x 3.8 cm, see comment.  Histologic Type: Endometrioid adenocarcinoma.  Histologic Grade: FIGO grade 2.  Ovarian Surface Involvement: Not identified.  Fallopian Tube Surface Involvement: Not identified.  See comment.  Other Tissue/ Organ Involvement: Not identified.  Peritoneal/Ascitic Fluid Involvement: Not applicable.  Chemotherapy Response Score (CRS): Near complete response, CRS 3.  See  comment.  Regional Lymph Nodes: No lymph nodes submitted.  Distant Metastasis:       Distant Site(s) Involved: Not identified.  Pathologic Stage Classification (pTNM, AJCC 8th Edition): ypT2a, ypNX  Ancillary Studies: Can be performed if requested.  Representative Tumor Block: A6 and A7.    02/29/2020 Tumor Marker   Patient's tumor was tested for the following markers: CA-125 Results of the tumor marker test revealed 14.2   03/02/2020 Genetic Testing   Negative hereditary cancer genetic testing: no pathogenic variants detected in [facility] CustomNext-Cancer +RNAinsight Panel.  The report date is March 02, 2020.  HRD testing was unable to be performed due to insufficient sample availability.   The CustomNext-Cancer+RNAinsight panel offered by [employer] includes sequencing and rearrangement analysis for the following 47 genes:  APC, ATM, AXIN2, BARD1, BMPR1A, BRCA1, BRCA2, BRIP1, CDH1, CDK4, CDKN2A,  CHEK2, DICER1, EPCAM, GREM1, HOXB13, MEN1, MLH1, MSH2, MSH3, MSH6, MUTYH, NBN, NF1, NF2, NTHL1, PALB2, PMS2, POLD1, POLE, PTEN, RAD51C, RAD51D, RECQL, RET, SDHA, SDHAF2, SDHB, SDHC, SDHD, SMAD4, SMARCA4, STK11, TP53, TSC1, TSC2, and VHL.  RNA data is routinely analyzed for use in variant interpretation for all genes.   03/21/2020 Tumor Marker   Patient's tumor was tested for the following markers: CA-125 Results of the tumor marker test revealed 11.5   04/20/2020 Tumor Marker   Patient's tumor was tested for the following markers: CA-125 Results of the tumor marker test revealed 9.3   04/24/2020 Imaging   1. Status post interval hysterectomy and oophorectomy. 2. Interval resolution of previously seen small volume ascites throughout the abdomen and pelvis. There is minimal peritoneal thickening, for example in the right paracolic gutter. 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 3. Stable small pulmonary nodules of the left lung base, measuring 3 mm, most likely incidental and benign. Continued attention on follow-up.     10/24/2020 Tumor Marker   Patient's tumor was tested for the following markers: CA-125. Results of the tumor marker  test revealed 9.   01/23/2021 Tumor Marker   Patient's tumor was tested for the following markers: CA-125. Results of the tumor marker test revealed 8.4.   05/16/2021 Tumor Marker   Patient's tumor was tested for the following markers: CA-125. Results of the tumor marker test revealed 9.4.    Tumor Marker   Patient's tumor was tested for the following markers: CA-125. Results of the tumor marker test revealed 9.5.   11/28/2021 Imaging   Chest Impression:   1. No evidence of thoracic metastasis. 2. Three  small LEFT lobe pulmonary nodules unchanged from 2021   Abdomen / Pelvis Impression:   1. No evidence of ovarian carcinoma local recurrence. 2. No evidence of metastatic disease. 3. No free fluid or  peritoneal nodularity.   03/05/2022 Tumor Marker   Patient's tumor was tested for the following markers: CA-125. Results of the tumor marker test revealed 9.1.   05/29/2022 Tumor Marker   Patient's tumor was tested for the following markers: CA-125. Results of the tumor marker test revealed 9.   07/01/2022 Imaging   CT ABDOMEN PELVIS W CONTRAST  Result Date: 06/30/2022 CLINICAL DATA:  Evaluate for ovarian cancer recurrence. * Tracking Code: BO * EXAM: CT ABDOMEN AND PELVIS WITH CONTRAST TECHNIQUE: Multidetector CT imaging of the abdomen and pelvis was performed using the standard protocol following bolus administration of intravenous contrast. RADIATION DOSE REDUCTION: This exam was performed according to the departmental dose-optimization program which includes automated exposure control, adjustment of the mA and/or kV according to patient size and/or use of iterative reconstruction technique. CONTRAST:  OMNIPAQUE IOHEXOL 300 MG/ML  SOLN COMPARISON:  11/27/2021 FINDINGS: Lower chest: No acute abnormality. There are 2 peripheral subpleural nodules in the lower lobe which are unchanged from previous exam. The largest measures 4 mm, image 9/4. Hepatobiliary: Unchanged cyst within dome of liver measuring 1.4 cm, image 7/2. No suspicious liver lesions. Gallbladder appears normal. No bile duct dilatation. Pancreas: Unremarkable. No pancreatic ductal dilatation or surrounding inflammatory changes. Spleen: Normal in size without focal abnormality. Adrenals/Urinary Tract: Normal adrenal glands. No nephrolithiasis, hydronephrosis or suspicious mass. Urinary bladder is unremarkable. Stomach/Bowel: Stomach appears normal. No pathologic dilatation of the large or small bowel loops. No bowel wall thickening or inflammation. Vascular/Lymphatic: Aortic atherosclerosis. No enlarged abdominal or pelvic lymph nodes. Reproductive: Status post hysterectomy. No adnexal masses. Other: There is no ascites or focal fluid  collections. There is no peritoneal nodularity or mass. Musculoskeletal: Degenerative disc disease is noted L3-4 and L4-5. There is an anterolisthesis L4 on L5 measuring 5 mm. No acute or suspicious osseous findings. IMPRESSION: 1. No acute findings within the abdomen or pelvis. 2. No specific findings identified to suggest residual or recurrent tumor or metastatic disease. 3. Small peripheral subpleural nodules in the lower lobe are unchanged from previous exam. These are favored to represent a benign abnormality. 4. Lumbar spondylosis with anterolisthesis L4 on L5. 5.  Aortic Atherosclerosis (ICD10-I70.0). Electronically Signed   By: Signa Kell M.D.   On: 06/30/2022 16:19   MM 3D SCREENING MAMMOGRAM BILATERAL BREAST  Result Date: 06/24/2022 CLINICAL DATA:  Screening. EXAM: DIGITAL SCREENING BILATERAL MAMMOGRAM WITH TOMOSYNTHESIS AND CAD TECHNIQUE: Bilateral screening digital craniocaudal and mediolateral oblique mammograms were obtained. Bilateral screening digital breast tomosynthesis was performed. The images were evaluated with computer-aided detection. COMPARISON:  Previous exam(s). ACR Breast Density Category b: There are scattered areas of fibroglandular density. FINDINGS: In the right breast, a possible asymmetry warrants further evaluation. In the left breast, no  findings suspicious for malignancy. IMPRESSION: Further evaluation is suggested for possible asymmetry in the right breast. RECOMMENDATION: Diagnostic mammogram and possibly ultrasound of the right breast. (Code:[id number]) The patient will be contacted regarding the findings, and additional imaging will be scheduled. BI-RADS CATEGORY  0: Incomplete: Need additional imaging evaluation. Electronically Signed   By: Frederico Hamman M.D.   On: 06/24/2022 15:47      11/12/2022 Tumor Marker   Patient's tumor was tested for the following markers: CA-125. Results of the tumor marker test revealed 9.    The patient reported feeling vague  symptoms of abdominal bloating and early satiety in late August and early September 2021.  She attributed this to some decreased activity due to her recent fractured toe.  She mentioned to her primary care physician who performed a pelvic exam which was unremarkable at that time and ordered a plain abdominal x-ray which showed stool burden but no other changes.  The patient subsequently departed for a planned trip to [place].  While out camping in [place] she developed severe shortness of breath and progressive abdominal distention and discomfort.  This caught her camping trip short and she was seen by local emergency department in [place] where she was admitted and CT scans were performed.  The CT scans showed pulmonary embolisms in all lobes.  A large right central pelvic mass which is likely an ovarian neoplasm measuring at least 11 cm.  There were omental and peritoneal metastases with significant ascites.  A small left uterine calcification consistent with a fibroid was seen.  There was small right and left lung base nodules which could represent sequelae of previous infection or possible metastases.   She was admitted to a hospital in [place] and an IVC filter was placed on September 30, 2019. Tumor markers on October 03, 2019 included a Ca1 25 which was elevated at 713.  CA 19-9 was elevated to 100.  CEA was normal at 2.9. Paracentesis was performed on September 30, 2019 and showed adenocarcinoma. CT-guided biopsy of the pelvic mass on October 03, 2019 showed adenocarcinoma with malignant stains positive for keratin, CK7 and MOC-31.  There were negative for CK20 calretinin CDX2, ER, and WT 1.  The findings were consistent with a poorly differentiated adenocarcinoma of possible gynecologic primary.  Pancreas biliary urothelial primaries could also be considered.   She received her first cycle of carboplatin and paclitaxel as an inpatient in [place] on October 23, 2019.  She had been started on Eliquis for the DVT and PE.  An IVC filter had been placed as stated above.   She was able to return to [place] from [place] after her 1st cycle of chemotherapy and established care with Dr Andrey Farmer and Dr Bertis Ruddy from [facility].   She completed her 4th cycle of chemotherapy with carboplatin and paclitaxel on 12/13/19.  CA 125 on 11/16/19, which was day 1 of cycle 3, was normal at 30.   CT chest, abdomen and pelvis on 12/12/19 showed a solid mass in the right ovary measuring 4.2 x 4.2 x 3.3 cm. Uterus and left ovary are unremarkable in appearance. Posteriorly to this there was a 4.9 x 4.1 x 4.6 cm macrolobulated cystic lesion with multiple thick internal septations. Overall, the combined size of these lesions or this lesion appears decreased compared to the prior examination from 10/05/2019, indicating a positive response to therapy.No new lesions were seen. There was reduction in  ascites present.   It was determined that she had demonstrated good partial response to 4 cycles of neoadjuvant chemotherapy, a 3 month time period had elapsed since her PE, and was then a good candidate for interval surgical cytoreduction.    On 01/10/20 she underwent robotic assisted total hysterectomy, BSO, omentectomy. Intraoperative findings were significant for a bulky right ovarian mass measuring approximately 6 cm, mild nodularity to the left ovary, tumor nodules on the peritoneum of the pelvis which were all resected, no gross omental disease.  No gross residual tumor at the completion of the procedure representing complete, optimal cytoreduction. Surgery was uncomplicated.  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  The focus of cancer that was residual measured 2 mm in greatest dimension.  There was a 1 mm focus of metastatic cancer in the left fallopian tube.  All other foci appeared necrotic with hemorrhagic nodules with no  viable tissue remaining.  Chemotherapy response score was 3 with near complete response.   BRCA negative on genetics. CA 125 revealed normalization at the completion of therapy (14.2 on 02/29/20).   Interval History: Doing well. Denies any abdominal or pelvic pain.  Reports baseline bowel bladder function.  Travel to [place] for about a month with her husband this summer.  Past Medical/Surgical History: Past Medical History:  Diagnosis Date   Allergy    Anemia    Broken foot 2022   left   Cancer (HCC)    ovarian   DVT (deep venous thrombosis) (HCC)    Heart murmur    benign never an issue   Numbness and tingling    Osteopenia 08/2016   T score -1.6 FRAX 7.8%/0.7%   Peripheral neuropathy, idiopathic    Pulmonary embolism (HCC)     Past Surgical History:  Procedure Laterality Date   fibroid in 2011,2003     IR IVC FILTER RETRIEVAL / S&I /IMG GUID/MOD SED  04/26/2020   IR RADIOLOGIST EVAL & MGMT  02/10/2020   miscarriage 1989     ovarian cyst 1992     Robotic-assisted laparoscopic total hysterectomy with bilateral salpingoophorectomy, omentectomy, radical tumor debulking   01/10/2020    Family History  Problem Relation Age of Onset   Leukemia Mother 84   Arthritis Father    Colon cancer Father 16   Prostate cancer Father 69   Colon cancer Paternal Uncle 34   Breast cancer Maternal Grandmother 72   Leukemia Maternal Grandfather 85   Colon cancer Cousin 26       paternal female cousin   Throat cancer Cousin 49       paternal female cousin   Prostate cancer Cousin        paternal cousin; dx early 90s; metastatic   Esophageal cancer Neg Hx    Rectal cancer Neg Hx    Stomach cancer Neg Hx    Pancreatic cancer Neg Hx     Social History   Socioeconomic History   Marital status: Married    Spouse name: Molly Maduro   Number of children: 3   Years of education: [employer] education level: Not on file  Occupational History   Occupation: PRESCHOOL TEACHER    Employer:  [employer]  Tobacco Use   Smoking status: Never   Smokeless tobacco: Never  Vaping Use   Vaping status: Never Used  Substance and Sexual Activity   Alcohol use: Yes    Comment: 3 a week   Drug use: No  Sexual activity: Yes    Partners: Male    Birth control/protection: Post-menopausal, Surgical    Comment: hysterectomy  Other Topics Concern   Not on file  Social History Narrative   Patient is married Molly Maduro) and lives at home with her husband. Married x 36 years.   Patient has three adult children; 2 in [place] (30, 61); one in [place] (25).  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   Patient has a college education.   Patient is right-handed.   Patient drinks one cup of coffee daily.   Pillates three times per week for sixteen years.     Social Determinants of Health   Financial Resource Strain: Not on file  Food Insecurity: Not on file  Transportation Needs: Not on file  Physical Activity: Not on file  Stress: Not on file  Social Connections: Not on file    Current Medications:  Current Outpatient Medications:    Alpha-Lipoic Acid 300 MG CAPS, Take 1-2 capsules (300-600 mg total) by mouth once daily, Disp: 60 capsule, Rfl: 11   azithromycin (ZITHROMAX) 250 MG tablet, Take 2 tablets by mouth on day 1 then take 1 tablet once daily for 4 days, Disp: 6 tablet, Rfl: 0   cetirizine (ZYRTEC) 10 MG tablet, Chew 10 mg by mouth daily., Disp: , Rfl:    Cholecalciferol 50 MCG (2000 UT) CAPS, Take 2,000 Units by mouth daily., Disp: , Rfl:    cyanocobalamin (VITAMIN B12) 1000 MCG tablet, Take 1,000 mcg by mouth daily., Disp: , Rfl:    fluticasone (FLONASE) 50 MCG/ACT nasal spray, Place 1 spray into both nostrils daily., Disp: , Rfl:    gabapentin (NEURONTIN) 300 MG capsule, Take 1 capsule (300 mg total) by mouth 3 (three) times daily., Disp: 270 capsule, Rfl: 3   melatonin 5 MG TABS, Take 5 mg by mouth., Disp:  , Rfl:    nirmatrelvir & ritonavir (PAXLOVID, 300/100,) 20 x 150 MG & 10 x 100MG  TBPK, Take 3 tablets  twice daily for 5 days., Disp: 30 tablet, Rfl: 0   predniSONE (STERAPRED UNI-PAK 21 TAB) 10 MG (21) TBPK tablet, Take as directed per package instructions., Disp: 21 tablet, Rfl: 0  Review of Systems: Denies appetite changes, fevers, chills, fatigue, unexplained weight changes. Denies hearing loss, neck lumps or masses, mouth sores, ringing in ears or voice changes. Denies cough or wheezing.  Denies shortness of breath. Denies chest pain or palpitations. Denies leg swelling. Denies abdominal distention, pain, blood in stools, constipation, diarrhea, nausea, vomiting, or early satiety. Denies pain with intercourse, dysuria, frequency, hematuria or incontinence. Denies hot flashes, pelvic pain, vaginal bleeding or vaginal discharge.   Denies joint pain, back pain or muscle pain/cramps. Denies itching, rash, or wounds. Denies dizziness, headaches, numbness or seizures. Denies swollen lymph nodes or glands, denies easy bruising or bleeding. Denies anxiety, depression, confusion, or decreased concentration.  Physical Exam: BP (!) 138/93 (BP Location: Left Arm, Patient Position: Sitting) Comment: First was 138/96 rechecked notified RN  Pulse (!) 55 Comment: Notified RN Patient States Thats The Usual  Temp 98.1 F (36.7 C) (Oral)   Resp 19   Wt 159 lb 3.2 oz (72.2 kg)   SpO2 99%   BMI 28.20 kg/m  General: Alert, oriented, no acute distress. HEENT: Normocephalic, atraumatic, sclera anicteric. Chest: Clear to auscultation bilaterally.  No wheezes or rhonchi. Cardiovascular: Regular rate and rhythm, no murmurs. Abdomen: soft, nontender.  Normoactive bowel  sounds.  No masses or hepatosplenomegaly appreciated.  Extremities: Grossly normal range of motion.  Warm, well perfused.  No edema bilaterally. Skin: No rashes or lesions noted. Lymphatics: No cervical, supraclavicular, or inguinal  adenopathy. GU: Normal appearing external genitalia without erythema, excoriation, or lesions.  Speculum exam reveals mildly atrophic vaginal mucosa, cuff intact, no lesions or masses visible.  Bimanual exam reveals cuff intact, no nodularity or masses.  Rectovaginal exam confirms findings.  Laboratory & Radiologic Studies: Component Ref Range & Units 2 d ago (11/11/22) 5 mo ago (05/27/22) 8 mo ago (03/03/22) 1 yr ago (11/13/21) 1 yr ago (08/19/21) 1 yr ago (05/13/21) 1 yr ago (01/23/21)  Cancer Antigen (CA) 125 0.0 - 38.1 U/mL 9.0 9.0 CM 9.1 CM 9.5 CM 9.1 CM 9.4 CM 8.4    Assessment & Plan: Kristina Flores is a 62 y.o. 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 s/p neoadjuvant chemotherapy, interval cytoreduction (complete) on 01/10/20, adjuvant carboplatin and paclitaxel completed on 03/21/20.   She is overall doing well.  NED on exam today.   We discussed recent CA-125 (normal).   Per NCCN surveillance recommendations, she is more than 2.5 years out from completion of adjuvant therapy.  We discussed transitioning to visits every 4-6 months after that time.  She is scheduled to see Dr. Bertis Ruddy in June. I will plan to see her in February.    We reviewed signs and symptoms that would be concerning for cancer recurrence, and I stressed the importance of calling if she develops any of these.  20 minutes of total time was spent for this patient encounter, including preparation, face-to-face counseling with the patient and coordination of care, and documentation of the encounter.  Eugene Garnet, MD  Division of Gynecologic Oncology  Department of Obstetrics and Gynecology  [facility]

## 2022-11-14 ENCOUNTER — Encounter: Payer: Self-pay | Admitting: Gynecologic Oncology

## 2022-11-14 ENCOUNTER — Inpatient Hospital Stay: Payer: Commercial Managed Care - PPO | Attending: Gynecologic Oncology | Admitting: Gynecologic Oncology

## 2022-11-14 VITALS — BP 138/93 | HR 55 | Temp 98.1°F | Resp 19 | Wt 159.2 lb

## 2022-11-14 DIAGNOSIS — Z90722 Acquired absence of ovaries, bilateral: Secondary | ICD-10-CM | POA: Diagnosis not present

## 2022-11-14 DIAGNOSIS — C569 Malignant neoplasm of unspecified ovary: Secondary | ICD-10-CM

## 2022-11-14 DIAGNOSIS — Z8543 Personal history of malignant neoplasm of ovary: Secondary | ICD-10-CM | POA: Diagnosis not present

## 2022-11-14 DIAGNOSIS — Z90710 Acquired absence of both cervix and uterus: Secondary | ICD-10-CM | POA: Diagnosis not present

## 2022-11-14 DIAGNOSIS — Z9079 Acquired absence of other genital organ(s): Secondary | ICD-10-CM | POA: Diagnosis not present

## 2022-11-14 DIAGNOSIS — Z9221 Personal history of antineoplastic chemotherapy: Secondary | ICD-10-CM | POA: Insufficient documentation

## 2022-11-14 NOTE — Patient Instructions (Signed)
It was good to see you today.  I do not see or feel any evidence of cancer recurrence on your exam.  I will see you for follow-up in Febraury.  As always, if you develop any new and concerning symptoms before your next visit, please call to see me sooner.

## 2022-11-18 ENCOUNTER — Other Ambulatory Visit: Payer: Self-pay

## 2022-11-18 DIAGNOSIS — Z13820 Encounter for screening for osteoporosis: Secondary | ICD-10-CM

## 2022-11-18 DIAGNOSIS — Z780 Asymptomatic menopausal state: Secondary | ICD-10-CM

## 2022-11-18 DIAGNOSIS — M85851 Other specified disorders of bone density and structure, right thigh: Secondary | ICD-10-CM

## 2022-11-24 ENCOUNTER — Other Ambulatory Visit (HOSPITAL_COMMUNITY): Payer: Self-pay

## 2022-11-24 MED ORDER — COVID-19 MRNA VAC-TRIS(PFIZER) 30 MCG/0.3ML IM SUSY
0.3000 mL | PREFILLED_SYRINGE | INTRAMUSCULAR | 0 refills | Status: AC
  Filled 2022-11-24: qty 0.3, 1d supply, fill #0

## 2022-11-25 ENCOUNTER — Ambulatory Visit (HOSPITAL_BASED_OUTPATIENT_CLINIC_OR_DEPARTMENT_OTHER)
Admission: RE | Admit: 2022-11-25 | Discharge: 2022-11-25 | Disposition: A | Discharge status: Home / Self Care | Payer: Commercial Managed Care - PPO | Source: Ambulatory Visit | Attending: Nurse Practitioner | Admitting: Nurse Practitioner

## 2022-11-25 DIAGNOSIS — M85851 Other specified disorders of bone density and structure, right thigh: Secondary | ICD-10-CM | POA: Insufficient documentation

## 2022-11-25 DIAGNOSIS — Z13820 Encounter for screening for osteoporosis: Secondary | ICD-10-CM | POA: Insufficient documentation

## 2022-11-25 DIAGNOSIS — Z780 Asymptomatic menopausal state: Secondary | ICD-10-CM | POA: Diagnosis not present

## 2022-12-03 NOTE — Telephone Encounter (Signed)
Telephone call  

## 2022-12-17 ENCOUNTER — Other Ambulatory Visit (HOSPITAL_COMMUNITY): Payer: Self-pay

## 2022-12-17 MED ORDER — AREXVY 120 MCG/0.5ML IM SUSR
INTRAMUSCULAR | 0 refills | Status: DC
  Filled 2022-12-17: qty 0.5, 1d supply, fill #0

## 2023-01-05 ENCOUNTER — Other Ambulatory Visit (HOSPITAL_COMMUNITY): Payer: Self-pay

## 2023-01-06 ENCOUNTER — Other Ambulatory Visit (HOSPITAL_COMMUNITY): Payer: Self-pay

## 2023-02-16 ENCOUNTER — Other Ambulatory Visit: Payer: Commercial Managed Care - PPO

## 2023-02-17 ENCOUNTER — Other Ambulatory Visit: Payer: Self-pay

## 2023-02-17 ENCOUNTER — Inpatient Hospital Stay: Payer: Medicare Other | Attending: Gynecologic Oncology

## 2023-02-17 DIAGNOSIS — I824Z3 Acute embolism and thrombosis of unspecified deep veins of distal lower extremity, bilateral: Secondary | ICD-10-CM

## 2023-02-17 DIAGNOSIS — Z9079 Acquired absence of other genital organ(s): Secondary | ICD-10-CM | POA: Diagnosis not present

## 2023-02-17 DIAGNOSIS — Z9221 Personal history of antineoplastic chemotherapy: Secondary | ICD-10-CM | POA: Insufficient documentation

## 2023-02-17 DIAGNOSIS — R918 Other nonspecific abnormal finding of lung field: Secondary | ICD-10-CM

## 2023-02-17 DIAGNOSIS — C569 Malignant neoplasm of unspecified ovary: Secondary | ICD-10-CM

## 2023-02-17 DIAGNOSIS — I2699 Other pulmonary embolism without acute cor pulmonale: Secondary | ICD-10-CM

## 2023-02-17 DIAGNOSIS — Z8543 Personal history of malignant neoplasm of ovary: Secondary | ICD-10-CM | POA: Diagnosis present

## 2023-02-17 DIAGNOSIS — Z90722 Acquired absence of ovaries, bilateral: Secondary | ICD-10-CM | POA: Diagnosis not present

## 2023-02-17 DIAGNOSIS — D61818 Other pancytopenia: Secondary | ICD-10-CM

## 2023-02-17 DIAGNOSIS — Z08 Encounter for follow-up examination after completed treatment for malignant neoplasm: Secondary | ICD-10-CM | POA: Diagnosis present

## 2023-02-17 DIAGNOSIS — Z90710 Acquired absence of both cervix and uterus: Secondary | ICD-10-CM | POA: Insufficient documentation

## 2023-02-17 DIAGNOSIS — R180 Malignant ascites: Secondary | ICD-10-CM

## 2023-02-17 LAB — COMPREHENSIVE METABOLIC PANEL
ALT: 13 U/L (ref 0–44)
AST: 21 U/L (ref 15–41)
Albumin: 4.2 g/dL (ref 3.5–5.0)
Alkaline Phosphatase: 61 U/L (ref 38–126)
Anion gap: 5 (ref 5–15)
BUN: 16 mg/dL (ref 8–23)
CO2: 27 mmol/L (ref 22–32)
Calcium: 9.3 mg/dL (ref 8.9–10.3)
Chloride: 107 mmol/L (ref 98–111)
Creatinine, Ser: 0.88 mg/dL (ref 0.44–1.00)
GFR, Estimated: >60 mL/min (ref >60)
Glucose, Bld: 122 mg/dL — ABNORMAL HIGH (ref 70–99)
Potassium: 4 mmol/L (ref 3.5–5.1)
Sodium: 139 mmol/L (ref 135–145)
Total Bilirubin: 0.3 mg/dL (ref 0.0–1.2)
Total Protein: 6.8 g/dL (ref 6.5–8.1)

## 2023-02-17 LAB — CBC WITH DIFFERENTIAL/PLATELET
Abs Immature Granulocytes: 0.02 10*3/uL (ref 0.00–0.07)
Basophils Absolute: 0 10*3/uL (ref 0.0–0.1)
Basophils Relative: 1 %
Eosinophils Absolute: 0.1 10*3/uL (ref 0.0–0.5)
Eosinophils Relative: 1 %
HCT: 37.3 % (ref 36.0–46.0)
Hemoglobin: 12.6 g/dL (ref 12.0–15.0)
Immature Granulocytes: 0 %
Lymphocytes Relative: 30 %
Lymphs Abs: 1.6 10*3/uL (ref 0.7–4.0)
MCH: 29.8 pg (ref 26.0–34.0)
MCHC: 33.8 g/dL (ref 30.0–36.0)
MCV: 88.2 fL (ref 80.0–100.0)
Monocytes Absolute: 0.4 10*3/uL (ref 0.1–1.0)
Monocytes Relative: 8 %
Neutro Abs: 3.2 10*3/uL (ref 1.7–7.7)
Neutrophils Relative %: 60 %
Platelets: 253 10*3/uL (ref 150–400)
RBC: 4.23 MIL/uL (ref 3.87–5.11)
RDW: 12.5 % (ref 11.5–15.5)
WBC: 5.3 10*3/uL (ref 4.0–10.5)
nRBC: 0 % (ref 0.0–0.2)

## 2023-02-18 LAB — CA 125: Cancer Antigen (CA) 125: 8.7 U/mL (ref 0.0–38.1)

## 2023-02-20 ENCOUNTER — Ambulatory Visit: Payer: Commercial Managed Care - PPO | Admitting: Gynecologic Oncology

## 2023-02-26 ENCOUNTER — Inpatient Hospital Stay (HOSPITAL_BASED_OUTPATIENT_CLINIC_OR_DEPARTMENT_OTHER): Payer: Commercial Managed Care - PPO | Admitting: Gynecologic Oncology

## 2023-02-26 VITALS — BP 142/70 | HR 63 | Temp 98.7°F | Resp 20 | Wt 161.6 lb

## 2023-02-26 DIAGNOSIS — Z8543 Personal history of malignant neoplasm of ovary: Secondary | ICD-10-CM | POA: Diagnosis not present

## 2023-02-26 DIAGNOSIS — Z08 Encounter for follow-up examination after completed treatment for malignant neoplasm: Secondary | ICD-10-CM | POA: Diagnosis not present

## 2023-02-26 DIAGNOSIS — C569 Malignant neoplasm of unspecified ovary: Secondary | ICD-10-CM

## 2023-02-26 NOTE — Patient Instructions (Signed)
Your exam today was normal with no concerning findings.   Plan on following up with Dr. Bertis Ruddy as scheduled and Dr. Pricilla Holm in October 2025 or sooner if needed.   Symptoms to report to your health care team include vaginal bleeding, rectal bleeding, bloating, weight loss without effort, new and persistent pain, new and  persistent fatigue, new leg swelling, new masses (i.e., bumps in your neck or groin), new and persistent cough, new and persistent nausea and vomiting, change in bowel or bladder habits, and any other concerns.

## 2023-02-27 NOTE — Progress Notes (Signed)
 Gynecologic Oncology Return Clinic Visit  02/26/2023  Reason for Visit: Follow-up in the setting of ovarian cancer   Treatment History: Oncology History Overview Note  Neg Genetics, unable to do HRD   Ovarian cancer (HCC)  09/20/2019 Initial Diagnosis   Presented to her primary care doctor with abdominal bloating.  Abdominal x-ray imaging was unremarkable.   09/29/2019 Imaging   CT of the chest showed pulmonary embolism in all lobes.  Large right/central pelvic mass likely ovarian neoplasm with omental/peritoneal metastasis with significant free fluid in the abdomen and pelvis.  A small left uterine calcification could be seen in a degenerating fibroid.  Small right and left lung base nodules, could represent sequela of previous infection or inflammation with metastasis not excluded.   09/29/2019 - 10/15/2019 Hospital Admission   She presented to the emergency department in [place] after complaints of leg pain with bilateral lower extremity edema.  D-dimer was elevated.  Lower extremity ultrasound demonstrated extensive bilateral DVT involving peroneal and soleal calf veins on the left and peroneal vein on the right.  CT abdomen and pelvis showed large pelvic mass with omental metastasis and ascites, worrisome for ovarian cancer.  CT imaging of the chest showed bilateral pulmonary nodules as well as evidence of pulmonary embolism.  She received anticoagulation therapy.  She was also found to be anemic, worrisome that she might have bleeding in the tumor and received transfusion.  Echocardiogram showed preserved ejection fraction but evidence of moderate right ventricular strain.  Subsequent biopsy showed cancer suspicious for ovarian primary and she received first cycle of neoadjuvant chemotherapy with carboplatin and taxol. She had IVC filter placement and temporary TPN   09/30/2019 Procedure   Ultrasound paracentesis was performed complicated by bleeding and received blood transfusion   09/30/2019  Procedure   She had IVC filter placement by interventional radiologist in [facility].   09/30/2019 Echocardiogram   Outside echocardiogram showed left ventricular ejection fraction around 79%.  Concentric left ventricular modeling.  Grade 1 diastolic pattern with normal left atrial pressure.  Right ventricle size is mildly enlarged.  Right ventricular systolic function is moderately reduced.  Right ventricle was not well visualized.  Aortic sclerosis without evidence of stenosis.  Moderate pleural effusion on the left region   10/02/2019 Procedure   EGD was performed which showed no evidence of acute bleeding.  Biopsy of the stomach showed benign gastric mucosa with reactive gastropathy as may be seen in the healing phase of erosive gastritis.  Helicobacter organism testing is negative..   10/03/2019 Procedure   She underwent CT-guided biopsy of the pelvic mass.   10/03/2019 Tumor Marker   CEA level was 2.9.   10/03/2019 Pathology Results   Biopsy show adenocarcinoma.  The malignant cells stain positive for keratin AE1/AE3, CK7 and MOC-31.  Rare cells show weak reactivity with p63 and GA TA 3.  Cells are negative for CK20, calretinin, CDX2, TTF-1, ER and WT-1.  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  Possible consideration include, but not limited to, gynecological origin, pancreatico biliary and urothelial.   10/03/2019 Tumor Marker   Patient's tumor was tested for the following markers: CA-125 Results of the tumor marker test revealed 713 CA19-9 is 100   10/04/2019 Imaging   Tagged red blood cell scan equivocal for source of bleeding.   10/05/2019 Procedure   She underwent paracentesis.  IR removed 4100 cc of ascites fluid with significant blood noted.   10/05/2019 Imaging  CT abdomen and pelvis angiogram protocol show no obvious source of bleeding.   10/05/2019 Imaging   CT imaging of the abdomen and pelvis  showed no evidence of postprocedural hemorrhage or discrete area of GI bleeding.  Large volume ascites is present.  Small pockets of intraperitoneal free air are present, likely related to recent paracentesis.  Abnormal mass in the pelvic region most likely a neoplasm of uterine or ovarian origin.  Normal liver with benign cysts of the right hepatic dome.  Biliary tree and pancreas and spleen were age-appropriate   10/07/2019 Procedure   Ultrasound paracentesis was performed.   10/07/2019 -  Chemotherapy   The patient had neoadjuvant carboplatin and taxol for chemotherapy treatment.     10/08/2019 Imaging   CT angiogram of the abdominal aorta and iliofemoral test was done showing 14 x 10 x 14 cm pelvic mass with mild to moderate ascites.  Normal appearance of arterial structures without evidence of compression.  No evidence of compression of the iliac vein or IVC.   10/11/2019 Imaging   Ultrasound shows gallbladder full of sludge.  Fatty liver changes.  Ascites present.   10/18/2019 Cancer Staging   Staging form: Ovary, Fallopian Tube, and Primary Peritoneal Carcinoma, AJCC 8th Edition - Clinical stage from 10/18/2019: FIGO Stage IIIC, calculated as Stage Unknown (cT3c, cNX, cM0) - Signed by Artis Delay, MD on 10/18/2019   10/21/2019 Tumor Marker   Patient's tumor was tested for the following markers: CA-125. Results of the tumor marker test revealed 257.   10/31/2019 - 03/21/2020 Chemotherapy   The patient had carboplatin and taxol for chemotherapy treatment.     11/22/2019 Tumor Marker   Patient's tumor was tested for the following markers: CA-125. Results of the tumor marker test revealed 30.7   12/12/2019 Imaging   1. Today's study demonstrates a positive response to therapy with regression of right ovarian lesion(s), and decreased volume of presumably malignant ascites. 2. No definitive evidence of metastatic disease in the thorax. Multiple tiny 2-4 mm pulmonary nodules are stable compared  to prior examinations. Continued attention on follow-up studies is recommended to exclude the possibility of metastatic lesions    12/26/2019 Tumor Marker   Patient's tumor was tested for the following markers: CA-125 Results of the tumor marker test revealed 16.4   01/10/2020 Surgery   Surgeon: Quinn Axe    Assistants: Dr Antionette Char (an MD assistant was necessary for tissue manipulation, management of robotic instrumentation, retraction and positioning due to the complexity of the case and hospital policies).  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    Post-operative Diagnosis: same   Operation: Robotic-assisted laparoscopic total hysterectomy with bilateral salpingoophorectomy, omentectomy, radical tumor debulking     Operative Findings:  : bulky right ovarian mass (approximately 6cm), mild nodularity to left ovary, tumor nodules on peritoneum of pelvis (all resected), no gross omental disease. No gross residual tumor at the completion of the procedure representing a complete (optimal) cytoreduction   01/10/2020 Pathology Results   Clinical History: Ovarian cancer ([facility])   FINAL MICROSCOPIC DIAGNOSIS:   A. UTERUS, CERVIX, BILATERAL TUBES AND OVARIES:  - Right ovary:       Microscopic foci of adenocarcinoma associated with extensive necrosis and hemosiderin deposition.       No ovarian surface involvement identified.       Paraovarian necrotic and hemorrhagic nodule.       See oncology table  and comment.  - Left Fallopian tube:       Microscopic focus of adenocarcinoma involving submucosa.       See comment.  - Cervix:       Nabothian cyst.       No dysplasia or malignancy.  - Endometrium:       Inactive.       No hyperplasia or malignancy.  - Right Fallopian tube:       Adhesions with focal hemosiderin deposition.       No malignancy identified.  - Left  ovary:       Endosalpingosis.       No malignancy identified.   B. PERITONEAL NODULE, BIOPSY:  - Necrotic and hemorrhagic nodule with hemosiderin deposition.  - No viable malignancy identified.   C. OMENTUM:  - Foci of fibrosis associated with adhesions and hemosiderin deposition.  - No malignancy identified.  - One benign lymph node (0/1).   ONCOLOGY TABLE:  OVARY or FALLOPIAN TUBE or PRIMARY PERITONEUM: Resection  Procedure: Hysterectomy with bilateral salpingo-oophorectomy, peritoneal nodule biopsy and omentectomy.  Specimen Integrity: Intact.  Tumor Site: Right ovary.  Tumor Size: 5.2 x 4.5 x 3.8 cm, see comment.  Histologic Type: Endometrioid adenocarcinoma.  Histologic Grade: FIGO grade 2.  Ovarian Surface Involvement: Not identified.  Fallopian Tube Surface Involvement: Not identified.  See comment.  Other Tissue/ Organ Involvement: Not identified.  Peritoneal/Ascitic Fluid Involvement: Not applicable.  Chemotherapy Response Score (CRS): Near complete response, CRS 3.  See  comment.  Regional Lymph Nodes: No lymph nodes submitted.  Distant Metastasis:       Distant Site(s) Involved: Not identified.  Pathologic Stage Classification (pTNM, AJCC 8th Edition): ypT2a, ypNX  Ancillary Studies: Can be performed if requested.  Representative Tumor Block: A6 and A7.    02/29/2020 Tumor Marker   Patient's tumor was tested for the following markers: CA-125 Results of the tumor marker test revealed 14.2   03/02/2020 Genetic Testing   Negative hereditary cancer genetic testing: no pathogenic variants detected in [facility] CustomNext-Cancer +RNAinsight Panel.  The report date is March 02, 2020.  HRD testing was unable to be performed due to insufficient sample availability.   The CustomNext-Cancer+RNAinsight panel offered by [employer] includes sequencing and rearrangement analysis for the following 47 genes:  APC, ATM, AXIN2, BARD1, BMPR1A, BRCA1, BRCA2, BRIP1, CDH1, CDK4, CDKN2A,  CHEK2, DICER1, EPCAM, GREM1, HOXB13, MEN1, MLH1, MSH2, MSH3, MSH6, MUTYH, NBN, NF1, NF2, NTHL1, PALB2, PMS2, POLD1, POLE, PTEN, RAD51C, RAD51D, RECQL, RET, SDHA, SDHAF2, SDHB, SDHC, SDHD, SMAD4, SMARCA4, STK11, TP53, TSC1, TSC2, and VHL.  RNA data is routinely analyzed for use in variant interpretation for all genes.   03/21/2020 Tumor Marker   Patient's tumor was tested for the following markers: CA-125 Results of the tumor marker test revealed 11.5   04/20/2020 Tumor Marker   Patient's tumor was tested for the following markers: CA-125 Results of the tumor marker test revealed 9.3   04/24/2020 Imaging   1. Status post interval hysterectomy and oophorectomy. 2. Interval resolution of previously seen small volume ascites throughout the abdomen and pelvis. There is minimal peritoneal thickening, for example in the right paracolic gutter. 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 3. Stable small pulmonary nodules of the left lung base, measuring 3 mm, most likely incidental and benign. Continued attention on follow-up.     10/24/2020 Tumor Marker   Patient's tumor was tested for the following markers: CA-125. Results of the tumor marker  test revealed 9.   01/23/2021 Tumor Marker   Patient's tumor was tested for the following markers: CA-125. Results of the tumor marker test revealed 8.4.   05/16/2021 Tumor Marker   Patient's tumor was tested for the following markers: CA-125. Results of the tumor marker test revealed 9.4.    Tumor Marker   Patient's tumor was tested for the following markers: CA-125. Results of the tumor marker test revealed 9.5.   11/28/2021 Imaging   Chest Impression:   1. No evidence of thoracic metastasis. 2. Three  small LEFT lobe pulmonary nodules unchanged from 2021   Abdomen / Pelvis Impression:   1. No evidence of ovarian carcinoma local recurrence. 2. No evidence of metastatic disease. 3. No free fluid or  peritoneal nodularity.   03/05/2022 Tumor Marker   Patient's tumor was tested for the following markers: CA-125. Results of the tumor marker test revealed 9.1.   05/29/2022 Tumor Marker   Patient's tumor was tested for the following markers: CA-125. Results of the tumor marker test revealed 9.   07/01/2022 Imaging   CT ABDOMEN PELVIS W CONTRAST  Result Date: 06/30/2022 CLINICAL DATA:  Evaluate for ovarian cancer recurrence. * Tracking Code: BO * EXAM: CT ABDOMEN AND PELVIS WITH CONTRAST TECHNIQUE: Multidetector CT imaging of the abdomen and pelvis was performed using the standard protocol following bolus administration of intravenous contrast. RADIATION DOSE REDUCTION: This exam was performed according to the departmental dose-optimization program which includes automated exposure control, adjustment of the mA and/or kV according to patient size and/or use of iterative reconstruction technique. CONTRAST:  OMNIPAQUE IOHEXOL 300 MG/ML  SOLN COMPARISON:  11/27/2021 FINDINGS: Lower chest: No acute abnormality. There are 2 peripheral subpleural nodules in the lower lobe which are unchanged from previous exam. The largest measures 4 mm, image 9/4. Hepatobiliary: Unchanged cyst within dome of liver measuring 1.4 cm, image 7/2. No suspicious liver lesions. Gallbladder appears normal. No bile duct dilatation. Pancreas: Unremarkable. No pancreatic ductal dilatation or surrounding inflammatory changes. Spleen: Normal in size without focal abnormality. Adrenals/Urinary Tract: Normal adrenal glands. No nephrolithiasis, hydronephrosis or suspicious mass. Urinary bladder is unremarkable. Stomach/Bowel: Stomach appears normal. No pathologic dilatation of the large or small bowel loops. No bowel wall thickening or inflammation. Vascular/Lymphatic: Aortic atherosclerosis. No enlarged abdominal or pelvic lymph nodes. Reproductive: Status post hysterectomy. No adnexal masses. Other: There is no ascites or focal fluid  collections. There is no peritoneal nodularity or mass. Musculoskeletal: Degenerative disc disease is noted L3-4 and L4-5. There is an anterolisthesis L4 on L5 measuring 5 mm. No acute or suspicious osseous findings. IMPRESSION: 1. No acute findings within the abdomen or pelvis. 2. No specific findings identified to suggest residual or recurrent tumor or metastatic disease. 3. Small peripheral subpleural nodules in the lower lobe are unchanged from previous exam. These are favored to represent a benign abnormality. 4. Lumbar spondylosis with anterolisthesis L4 on L5. 5.  Aortic Atherosclerosis (ICD10-I70.0). Electronically Signed   By: Signa Kell M.D.   On: 06/30/2022 16:19   MM 3D SCREENING MAMMOGRAM BILATERAL BREAST  Result Date: 06/24/2022 CLINICAL DATA:  Screening. EXAM: DIGITAL SCREENING BILATERAL MAMMOGRAM WITH TOMOSYNTHESIS AND CAD TECHNIQUE: Bilateral screening digital craniocaudal and mediolateral oblique mammograms were obtained. Bilateral screening digital breast tomosynthesis was performed. The images were evaluated with computer-aided detection. COMPARISON:  Previous exam(s). ACR Breast Density Category b: There are scattered areas of fibroglandular density. FINDINGS: In the right breast, a possible asymmetry warrants further evaluation. In the left breast, no  findings suspicious for malignancy. IMPRESSION: Further evaluation is suggested for possible asymmetry in the right breast. RECOMMENDATION: Diagnostic mammogram and possibly ultrasound of the right breast. (Code:[id number]) The patient will be contacted regarding the findings, and additional imaging will be scheduled. BI-RADS CATEGORY  0: Incomplete: Need additional imaging evaluation. Electronically Signed   By: Frederico Hamman M.D.   On: 06/24/2022 15:47      11/12/2022 Tumor Marker   Patient's tumor was tested for the following markers: CA-125. Results of the tumor marker test revealed 9.   02/18/2023 Tumor Marker   Patient's  tumor was tested for the following markers: CA-`125. Results of the tumor marker test revealed 8.7.    The patient reported feeling vague symptoms of abdominal bloating and early satiety in late August and early September 2021.  She attributed this to some decreased activity due to her recent fractured toe.  She mentioned to her primary care physician who performed a pelvic exam which was unremarkable at that time and ordered a plain abdominal x-ray which showed stool burden but no other changes.  The patient subsequently departed for a planned trip to [place].  While out camping in [place] she developed severe shortness of breath and progressive abdominal distention and discomfort.  This caught her camping trip short and she was seen by local emergency department in [place] where she was admitted and CT scans were performed.  The CT scans showed pulmonary embolisms in all lobes.  A large right central pelvic mass which is likely an ovarian neoplasm measuring at least 11 cm.  There were omental and peritoneal metastases with significant ascites.  A small left uterine calcification consistent with a fibroid was seen.  There was small right and left lung base nodules which could represent sequelae of previous infection or possible metastases.   She was admitted to a hospital in [place] and an IVC filter was placed on September 30, 2019. Tumor markers on October 03, 2019 included a CA 125 which was elevated at 713.  CA 19-9 was elevated to 100.  CEA was normal at 2.9. Paracentesis was performed on September 30, 2019 and showed adenocarcinoma. CT-guided biopsy of the pelvic mass on October 03, 2019 showed adenocarcinoma with malignant stains positive for keratin, CK7 and MOC-31.  There were negative for CK20 calretinin CDX2, ER, and WT 1.  The findings were consistent with a poorly differentiated adenocarcinoma of possible gynecologic primary.  Pancreas biliary  urothelial primaries could also be considered.   She received her first cycle of carboplatin and paclitaxel as an inpatient in [place] on October 23, 2019.  She had been started on Eliquis for the DVT and PE.  An IVC filter had been placed as stated above.   She was able to return to [place] from [place] after her 1st cycle of chemotherapy and established care with Dr Andrey Farmer and Dr Bertis Ruddy from [facility].   She completed her 4th cycle of chemotherapy with carboplatin and paclitaxel on 12/13/19.  CA 125 on 11/16/19, which was day 1 of cycle 3, was normal at 30.   CT chest, abdomen and pelvis on 12/12/19 showed a solid mass in the right ovary measuring 4.2 x 4.2 x 3.3 cm. Uterus and left ovary are unremarkable in appearance. Posteriorly to this there was a 4.9 x 4.1 x 4.6 cm macrolobulated cystic lesion with multiple thick internal septations. Overall, the combined size of these lesions or this  lesion appears decreased compared to the prior examination from 10/05/2019, indicating a positive response to therapy.No new lesions were seen. There was reduction in ascites present.   It was determined that she had demonstrated good partial response to 4 cycles of neoadjuvant chemotherapy, a 3 month time period had elapsed since her PE, and was then a good candidate for interval surgical cytoreduction.    On 01/10/20, she underwent robotic assisted total hysterectomy, BSO, omentectomy. Intraoperative findings were significant for a bulky right ovarian mass measuring approximately 6 cm, mild nodularity to the left ovary, tumor nodules on the peritoneum of the pelvis which were all resected, no gross omental disease.  No gross residual tumor at the completion of the procedure representing complete, optimal cytoreduction. Surgery was uncomplicated.   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  The focus of cancer that was residual measured 2 mm in greatest  dimension.  There was a 1 mm focus of metastatic cancer in the left fallopian tube.  All other foci appeared necrotic with hemorrhagic nodules with no viable tissue remaining.  Chemotherapy response score was 3 with near complete response.   BRCA negative on genetics. CA 125 revealed normalization at the completion of therapy (14.2 on 02/29/20).   Interval History: She presents today to the office for continued follow up. Doing well. Denies any abdominal or pelvic pain.  Reports baseline bowel bladder function. No symptoms concerning for recurrence voiced.    Past Medical/Surgical History: Past Medical History:  Diagnosis Date   Allergy    Anemia    Broken foot 2022   left   Cancer (HCC)    ovarian   DVT (deep venous thrombosis) (HCC)    Heart murmur    benign never an issue   Numbness and tingling    Osteopenia 08/2016   T score -1.6 FRAX 7.8%/0.7%   Peripheral neuropathy, idiopathic    Pulmonary embolism (HCC)     Past Surgical History:  Procedure Laterality Date   fibroid in 2011,2003     IR IVC FILTER RETRIEVAL / S&I /IMG GUID/MOD SED  04/26/2020   IR RADIOLOGIST EVAL & MGMT  02/10/2020   miscarriage 1989     ovarian cyst 1992     Robotic-assisted laparoscopic total hysterectomy with bilateral salpingoophorectomy, omentectomy, radical tumor debulking   01/10/2020    Family History  Problem Relation Age of Onset   Leukemia Mother 23   Arthritis Father    Colon cancer Father 67   Prostate cancer Father 36   Colon cancer Paternal Uncle 41   Breast cancer Maternal Grandmother 34   Leukemia Maternal Grandfather 9   Colon cancer Cousin 67       paternal female cousin   Throat cancer Cousin 57       paternal female cousin   Prostate cancer Cousin        paternal cousin; dx early 69s; metastatic   Esophageal cancer Neg Hx    Rectal cancer Neg Hx    Stomach cancer Neg Hx    Pancreatic cancer Neg Hx     Social History   Socioeconomic History   Marital status:  Married    Spouse name: Molly Maduro   Number of children: 3   Years of education: College   Highest education level: Not on file  Occupational History   Occupation: PRESCHOOL TEACHER    Employer: [employer]  Tobacco Use   Smoking status: Never   Smokeless tobacco: Never  Vaping Use   Vaping status: Never Used  Substance and Sexual Activity   Alcohol use: Yes    Comment: 3 a week   Drug use: No   Sexual activity: Yes    Partners: Male    Birth control/protection: Post-menopausal, Surgical    Comment: hysterectomy  Other Topics Concern   Not on file  Social History Narrative   Patient is married Molly Maduro) and lives at home with her husband. Married x 36 years.   Patient has three adult children; 2 in [place] (30, 36); one in [place] (25).  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   Patient has a college education.   Patient is right-handed.   Patient drinks one cup of coffee daily.   Pillates three times per week for sixteen years.     Social Drivers of Corporate investment banker Strain: Not on file  Food Insecurity: Not on file  Transportation Needs: Not on file  Physical Activity: Not on file  Stress: Not on file  Social Connections: Not on file    Current Medications:  Current Outpatient Medications:    Alpha-Lipoic Acid 300 MG CAPS, Take 1-2 capsules (300-600 mg total) by mouth once daily, Disp: 60 capsule, Rfl: 11   cetirizine (ZYRTEC) 10 MG tablet, Chew 10 mg by mouth daily., Disp: , Rfl:    Cholecalciferol 50 MCG (2000 UT) CAPS, Take 2,000 Units by mouth daily., Disp: , Rfl:    cyanocobalamin (VITAMIN B12) 1000 MCG tablet, Take 1,000 mcg by mouth daily., Disp: , Rfl:    fluticasone (FLONASE) 50 MCG/ACT nasal spray, Place 1 spray into both nostrils daily., Disp: , Rfl:    gabapentin (NEURONTIN) 300 MG capsule, Take 1 capsule (300 mg total) by mouth 3 (three) times daily., Disp: 270 capsule, Rfl:  3   melatonin 5 MG TABS, Take 5 mg by mouth., Disp: , Rfl:   Review of Systems: ROS intake form + for ringing in the ears Denies appetite changes, fevers, chills, fatigue, unexplained weight changes. Denies hearing loss, neck lumps or masses, mouth sores, ringing in ears or voice changes. Denies cough or wheezing.  Denies shortness of breath. Denies chest pain or palpitations. Denies leg swelling. Denies abdominal distention, pain, blood in stools, constipation, diarrhea, nausea, vomiting, or early satiety. Denies pain with intercourse, dysuria, frequency, hematuria or incontinence. Denies hot flashes, pelvic pain, vaginal bleeding or vaginal discharge.   Denies joint pain, back pain or muscle pain/cramps. Denies itching, rash, or wounds. Denies dizziness, headaches, numbness or seizures. Denies swollen lymph nodes or glands, denies easy bruising or bleeding. Denies anxiety, depression, confusion, or decreased concentration.  Physical Exam: BP (!) 142/70 Comment: recheck BP from previous BP  Pulse 63   Temp 98.7 F (37.1 C) (Oral)   Resp 20   Wt 161 lb 9.6 oz (73.3 kg)   SpO2 96%   BMI 28.63 kg/m  General: Alert, oriented, no acute distress. HEENT: Normocephalic, atraumatic, sclera anicteric. Chest: Clear to auscultation bilaterally.  No wheezes or rhonchi. Cardiovascular: Regular rate and rhythm, no murmurs. Abdomen: soft, nontender.  Normoactive bowel sounds.  No masses or hepatosplenomegaly appreciated.  Extremities: Grossly normal range of motion.  Warm, well perfused.  No edema bilaterally. Skin: No rashes or lesions noted. Lymphatics: No cervical, supraclavicular, or inguinal adenopathy. GU: Normal appearing external genitalia without erythema, excoriation, or lesions.  Speculum exam reveals mildly atrophic vaginal mucosa, cuff intact, no lesions  or masses visible.  Bimanual exam reveals cuff intact, no nodularity or masses.  Rectovaginal exam confirms  findings.  Laboratory & Radiologic Studies: Component Ref Range & Units 2 d ago (11/11/22) 5 mo ago (05/27/22) 8 mo ago (03/03/22) 1 yr ago (11/13/21) 1 yr ago (08/19/21) 1 yr ago (05/13/21) 1 yr ago (01/23/21)  Cancer Antigen (CA) 125 0.0 - 38.1 U/mL 9.0 9.0 CM 9.1 CM 9.5 CM 9.1 CM 9.4 CM 8.4    Bone density on 11/25/2022: osteopenia Mammogram on 07/11/2022  Assessment & Plan: Kristina Flores is a 63 y.o. 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 s/p neoadjuvant chemotherapy, interval cytoreduction (complete) on 01/10/20, adjuvant carboplatin and paclitaxel completed on 03/21/20.   She is overall doing well.  NED on exam today. We discussed recent CA-125 (normal).   Per NCCN surveillance recommendations, visits will be every 4-6 months for continued surveillance.  She is scheduled to see Dr. Bertis Ruddy in June and Dr. Pricilla Holm in October 2025 or sooner if needed.   We reviewed signs and symptoms that would be concerning for cancer recurrence, and I stressed the importance of calling if she develops any of these.  20 minutes of total time was spent for this patient encounter, including preparation, face-to-face counseling with the patient and coordination of care, and documentation of the encounter.  Warner Mccreedy NP [facility] Oncology

## 2023-04-13 ENCOUNTER — Other Ambulatory Visit (HOSPITAL_COMMUNITY): Payer: Self-pay

## 2023-04-13 MED ORDER — GABAPENTIN 300 MG PO CAPS
300.0000 mg | ORAL_CAPSULE | Freq: Three times a day (TID) | ORAL | 3 refills | Status: AC
  Filled 2023-04-13: qty 270, 90d supply, fill #0

## 2023-05-12 ENCOUNTER — Other Ambulatory Visit: Payer: Self-pay | Admitting: Internal Medicine

## 2023-05-12 DIAGNOSIS — Z1231 Encounter for screening mammogram for malignant neoplasm of breast: Secondary | ICD-10-CM

## 2023-06-09 ENCOUNTER — Other Ambulatory Visit (HOSPITAL_COMMUNITY): Payer: Self-pay

## 2023-06-09 MED ORDER — MELOXICAM 15 MG PO TABS
15.0000 mg | ORAL_TABLET | Freq: Every day | ORAL | 5 refills | Status: AC
  Filled 2023-06-09: qty 30, 30d supply, fill #0

## 2023-06-09 MED ORDER — METHOCARBAMOL 500 MG PO TABS
500.0000 mg | ORAL_TABLET | Freq: Three times a day (TID) | ORAL | 1 refills | Status: AC | PRN
  Filled 2023-06-09: qty 90, 30d supply, fill #0

## 2023-06-17 ENCOUNTER — Other Ambulatory Visit: Payer: Self-pay | Admitting: Internal Medicine

## 2023-06-17 DIAGNOSIS — M542 Cervicalgia: Secondary | ICD-10-CM

## 2023-06-23 ENCOUNTER — Ambulatory Visit
Admission: RE | Admit: 2023-06-23 | Discharge: 2023-06-23 | Disposition: A | Discharge status: Home / Self Care | Source: Ambulatory Visit | Attending: Internal Medicine | Admitting: Internal Medicine

## 2023-06-23 DIAGNOSIS — M542 Cervicalgia: Secondary | ICD-10-CM

## 2023-06-25 ENCOUNTER — Inpatient Hospital Stay: Payer: Commercial Managed Care - PPO | Attending: Hematology and Oncology

## 2023-06-25 DIAGNOSIS — C569 Malignant neoplasm of unspecified ovary: Secondary | ICD-10-CM

## 2023-06-25 DIAGNOSIS — Z8543 Personal history of malignant neoplasm of ovary: Secondary | ICD-10-CM | POA: Insufficient documentation

## 2023-06-25 DIAGNOSIS — I2699 Other pulmonary embolism without acute cor pulmonale: Secondary | ICD-10-CM

## 2023-06-25 DIAGNOSIS — R180 Malignant ascites: Secondary | ICD-10-CM

## 2023-06-25 DIAGNOSIS — R918 Other nonspecific abnormal finding of lung field: Secondary | ICD-10-CM

## 2023-06-25 DIAGNOSIS — I824Z3 Acute embolism and thrombosis of unspecified deep veins of distal lower extremity, bilateral: Secondary | ICD-10-CM

## 2023-06-25 DIAGNOSIS — D61818 Other pancytopenia: Secondary | ICD-10-CM

## 2023-06-25 LAB — CBC WITH DIFFERENTIAL/PLATELET
Abs Immature Granulocytes: 0.01 10*3/uL (ref 0.00–0.07)
Basophils Absolute: 0 10*3/uL (ref 0.0–0.1)
Basophils Relative: 1 %
Eosinophils Absolute: 0.2 10*3/uL (ref 0.0–0.5)
Eosinophils Relative: 5 %
HCT: 39.5 % (ref 36.0–46.0)
Hemoglobin: 13.4 g/dL (ref 12.0–15.0)
Immature Granulocytes: 0 %
Lymphocytes Relative: 27 %
Lymphs Abs: 1.4 10*3/uL (ref 0.7–4.0)
MCH: 30.3 pg (ref 26.0–34.0)
MCHC: 33.9 g/dL (ref 30.0–36.0)
MCV: 89.4 fL (ref 80.0–100.0)
Monocytes Absolute: 0.8 10*3/uL (ref 0.1–1.0)
Monocytes Relative: 15 %
Neutro Abs: 2.7 10*3/uL (ref 1.7–7.7)
Neutrophils Relative %: 52 %
Platelets: 230 10*3/uL (ref 150–400)
RBC: 4.42 MIL/uL (ref 3.87–5.11)
RDW: 12.9 % (ref 11.5–15.5)
WBC: 5.1 10*3/uL (ref 4.0–10.5)
nRBC: 0 % (ref 0.0–0.2)

## 2023-06-25 LAB — COMPREHENSIVE METABOLIC PANEL WITH GFR
ALT: 16 U/L (ref 0–44)
AST: 24 U/L (ref 15–41)
Albumin: 4.4 g/dL (ref 3.5–5.0)
Alkaline Phosphatase: 66 U/L (ref 38–126)
Anion gap: 6 (ref 5–15)
BUN: 16 mg/dL (ref 8–23)
CO2: 30 mmol/L (ref 22–32)
Calcium: 9.5 mg/dL (ref 8.9–10.3)
Chloride: 102 mmol/L (ref 98–111)
Creatinine, Ser: 1.05 mg/dL — ABNORMAL HIGH (ref 0.44–1.00)
GFR, Estimated: 59 mL/min — ABNORMAL LOW (ref >60)
Glucose, Bld: 89 mg/dL (ref 70–99)
Potassium: 4.4 mmol/L (ref 3.5–5.1)
Sodium: 138 mmol/L (ref 135–145)
Total Bilirubin: 0.5 mg/dL (ref 0.0–1.2)
Total Protein: 7.5 g/dL (ref 6.5–8.1)

## 2023-06-26 LAB — CA 125: Cancer Antigen (CA) 125: 11 U/mL (ref 0.0–38.1)

## 2023-07-02 ENCOUNTER — Inpatient Hospital Stay (HOSPITAL_BASED_OUTPATIENT_CLINIC_OR_DEPARTMENT_OTHER): Payer: Commercial Managed Care - PPO | Admitting: Hematology and Oncology

## 2023-07-02 ENCOUNTER — Encounter: Payer: Self-pay | Admitting: Hematology and Oncology

## 2023-07-02 VITALS — BP 144/96 | HR 67 | Temp 98.1°F | Resp 18 | Ht 63.0 in | Wt 163.0 lb

## 2023-07-02 DIAGNOSIS — C561 Malignant neoplasm of right ovary: Secondary | ICD-10-CM

## 2023-07-02 DIAGNOSIS — Z8543 Personal history of malignant neoplasm of ovary: Secondary | ICD-10-CM | POA: Diagnosis not present

## 2023-07-02 NOTE — Assessment & Plan Note (Addendum)
 The patient was diagnosed with advanced stage III ovarian cancer in 2021 after presentation with significant bilateral DVT, PE, large pelvic mass with omental metastasis and pulmonary nodules in [place]  She received neoadjuvant chemotherapy followed by surgery and completion of chemotherapy by March 2022 with complete response.  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

## 2023-07-02 NOTE — Progress Notes (Signed)
 [facility] OFFICE PROGRESS NOTE  Patient Care Team: Jeannine Milroy., MD as PCP - General (Internal Medicine) Ferne Hoyle Chriss Coup, RN as Oncology Nurse Navigator (Oncology)  Assessment & Plan Malignant neoplasm of right ovary [facility]) The patient was diagnosed with advanced stage III ovarian cancer in 2021 after presentation with significant bilateral DVT, PE, large pelvic mass with omental metastasis and pulmonary nodules in [place]  She received neoadjuvant chemotherapy followed by surgery and completion of chemotherapy by March 2022 with complete response.  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    Almeda Jacobs, MD  INTERVAL HISTORY: she returns for surveillance follow-up She denies abdominal pain, changes in bowel habits or bloating  PHYSICAL EXAMINATION: ECOG PERFORMANCE STATUS: 0 - Asymptomatic  Vitals:   07/02/23 1019  BP: (!) 144/96  Pulse: 67  Resp: 18  Temp: 98.1 F (36.7 C)  SpO2: 98%   Filed Weights   07/02/23 1019  Weight: 163 lb (73.9 kg)   Oncology History Overview Note  Neg Genetics, unable to do HRD   Ovarian cancer (HCC)  09/20/2019 Initial Diagnosis   Presented to her primary care doctor with abdominal bloating.  Abdominal x-ray imaging was unremarkable.   09/29/2019 Imaging   CT of the chest showed pulmonary embolism in all lobes.  Large right/central pelvic mass likely ovarian neoplasm  with omental/peritoneal metastasis with significant free fluid in the abdomen and pelvis.  A small left uterine calcification could be seen in a degenerating fibroid.  Small right and left lung base nodules, could represent sequela of previous infection or inflammation with metastasis not excluded.   09/29/2019 - 10/15/2019 Hospital Admission   She presented to the emergency department in [place]  after complaints of leg pain with bilateral lower extremity edema.  D-dimer was elevated.  Lower extremity ultrasound demonstrated extensive bilateral DVT involving peroneal and soleal calf veins on the left and peroneal vein on the right.  CT abdomen and pelvis showed large pelvic mass with omental metastasis and ascites, worrisome for ovarian cancer.  CT imaging of the chest showed bilateral pulmonary nodules as well as evidence of pulmonary embolism.  She received anticoagulation therapy.  She was also found to be anemic, worrisome that she might have bleeding in the tumor and received transfusion.  Echocardiogram showed preserved ejection fraction but evidence of moderate right ventricular strain.  Subsequent biopsy showed cancer suspicious for ovarian primary and she received first cycle of neoadjuvant chemotherapy with carboplatin  and taxol . She had IVC filter placement and temporary TPN   09/30/2019 Procedure   Ultrasound paracentesis was performed complicated by bleeding and received blood transfusion   09/30/2019 Procedure   She had IVC filter placement by interventional radiologist in [place] .   09/30/2019 Echocardiogram   Outside echocardiogram showed left ventricular ejection fraction around 79%.  Concentric left ventricular modeling.  Grade 1 diastolic pattern with normal left atrial pressure.  Right ventricle size is mildly enlarged.  Right ventricular  systolic function is moderately reduced.  Right ventricle was not well visualized.  Aortic sclerosis without evidence of stenosis.  Moderate pleural effusion  on the left region   10/02/2019 Procedure   EGD was performed which showed no evidence of acute bleeding.  Biopsy of the stomach showed benign gastric mucosa with reactive gastropathy as may be seen in the healing phase of erosive gastritis.  Helicobacter organism testing is negative..   10/03/2019 Procedure   She underwent CT-guided biopsy of the pelvic mass.   10/03/2019 Tumor Marker   CEA level was 2.9.   10/03/2019 Pathology Results   Biopsy show adenocarcinoma.  The malignant cells stain positive for keratin AE1/AE3, CK7 and MOC-31.  Rare cells show weak reactivity with p63 and GA TA 3.  Cells are negative for CK20, calretinin, CDX2, TTF-1, ER and WT-1.  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  Possible consideration include, but not limited to, gynecological origin, pancreatico biliary and urothelial.   10/03/2019 Tumor Marker   Patient's tumor was tested for the following markers: CA-125 Results of the tumor marker test revealed 713 CA19-9 is 100   10/04/2019 Imaging   Tagged red blood cell scan equivocal for source of bleeding.   10/05/2019 Procedure   She underwent paracentesis.  IR removed 4100 cc of ascites fluid with significant blood noted.   10/05/2019 Imaging   CT abdomen and pelvis angiogram protocol show no obvious source of bleeding.   10/05/2019 Imaging   CT imaging of the abdomen and pelvis showed no evidence of postprocedural hemorrhage or discrete area of GI bleeding.  Large volume ascites is present.  Small pockets of intraperitoneal free air are present, likely related to recent paracentesis.  Abnormal mass in the pelvic region most likely a neoplasm of uterine or ovarian origin.  Normal liver with benign cysts of the right hepatic dome.  Biliary tree and pancreas and spleen were age-appropriate   10/07/2019 Procedure   Ultrasound paracentesis was performed.   10/07/2019 -   Chemotherapy   The patient had neoadjuvant carboplatin  and taxol  for chemotherapy treatment.     10/08/2019 Imaging   CT angiogram of the abdominal aorta and iliofemoral test was done showing 14 x 10 x 14 cm pelvic mass with mild to moderate ascites.  Normal appearance of arterial structures without evidence of compression.  No evidence of compression of the iliac vein or IVC.   10/11/2019 Imaging   Ultrasound shows gallbladder full of sludge.  Fatty liver changes.  Ascites present.   10/18/2019 Cancer Staging   Staging form: Ovary, Fallopian Tube, and Primary Peritoneal Carcinoma, AJCC 8th Edition - Clinical stage from 10/18/2019: FIGO Stage IIIC, calculated as Stage Unknown (cT3c, cNX, cM0) - Signed by Almeda Jacobs, MD on 10/18/2019   10/21/2019 Tumor Marker   Patient's tumor was tested for the following markers: CA-125. Results of the tumor marker test revealed 257.   10/31/2019 - 03/21/2020 Chemotherapy   The patient had carboplatin  and taxol  for chemotherapy treatment.     11/22/2019 Tumor Marker   Patient's tumor was tested for the following markers: CA-125. Results of the tumor marker test revealed 30.7   12/12/2019 Imaging   1. Today's study demonstrates a positive response to therapy with regression of right ovarian lesion(s), and decreased volume of presumably malignant ascites. 2. No definitive evidence of metastatic disease in the thorax. Multiple tiny 2-4 mm pulmonary nodules are stable compared to prior examinations. Continued attention  on follow-up studies is recommended to exclude the possibility of metastatic lesions    12/26/2019 Tumor Marker   Patient's tumor was tested for the following markers: CA-125 Results of the tumor marker test revealed 16.4   01/10/2020 Surgery   Surgeon: Diania Fortes    Assistants: Dr Abdul Hodgkin (an MD assistant was necessary for tissue manipulation, management of robotic instrumentation, retraction and positioning due to the  complexity of the case and hospital policies).  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    Post-operative Diagnosis: same   Operation: Robotic-assisted laparoscopic total hysterectomy with bilateral salpingoophorectomy, omentectomy, radical tumor debulking     Operative Findings:  : bulky right ovarian mass (approximately 6cm), mild nodularity to left ovary, tumor nodules on peritoneum of pelvis (all resected), no gross omental disease. No gross residual tumor at the completion of the procedure representing a complete (optimal) cytoreduction   01/10/2020 Pathology Results   Clinical History: Ovarian cancer ([facility])   FINAL MICROSCOPIC DIAGNOSIS:   A. UTERUS, CERVIX, BILATERAL TUBES AND OVARIES:  - Right ovary:       Microscopic foci of adenocarcinoma associated with extensive necrosis and hemosiderin deposition.       No ovarian surface involvement identified.       Paraovarian necrotic and hemorrhagic nodule.       See oncology table and comment.  - Left Fallopian tube:       Microscopic focus of adenocarcinoma involving submucosa.       See comment.  - Cervix:       Nabothian cyst.       No dysplasia or malignancy.  - Endometrium:       Inactive.       No hyperplasia or malignancy.  - Right Fallopian tube:       Adhesions with focal hemosiderin deposition.       No malignancy identified.  - Left ovary:       Endosalpingosis.       No malignancy identified.   B. PERITONEAL NODULE, BIOPSY:  - Necrotic and hemorrhagic nodule with hemosiderin deposition.  - No viable malignancy identified.   C. OMENTUM:  - Foci of fibrosis associated with adhesions and hemosiderin deposition.  - No malignancy identified.  - One benign lymph node (0/1).   ONCOLOGY TABLE:  OVARY or FALLOPIAN TUBE or PRIMARY PERITONEUM: Resection  Procedure: Hysterectomy with bilateral salpingo-oophorectomy,  peritoneal nodule biopsy and omentectomy.  Specimen Integrity: Intact.  Tumor Site: Right ovary.  Tumor Size: 5.2 x 4.5 x 3.8 cm, see comment.  Histologic Type: Endometrioid adenocarcinoma.  Histologic Grade: FIGO grade 2.  Ovarian Surface Involvement: Not identified.  Fallopian Tube Surface Involvement: Not identified.  See comment.  Other Tissue/ Organ Involvement: Not identified.  Peritoneal/Ascitic Fluid Involvement: Not applicable.  Chemotherapy Response Score (CRS): Near complete response, CRS 3.  See  comment.  Regional Lymph Nodes: No lymph nodes submitted.  Distant Metastasis:       Distant Site(s) Involved: Not identified.  Pathologic Stage Classification (pTNM, AJCC 8th Edition): ypT2a, ypNX  Ancillary Studies: Can be performed if requested.  Representative Tumor Block: A6 and A7.    02/29/2020 Tumor Marker   Patient's tumor was tested for the following markers: CA-125 Results of the tumor marker test revealed 14.2   03/02/2020 Genetic Testing   Negative hereditary cancer genetic testing: no pathogenic variants detected in [employer] CustomNext-Cancer +RNAinsight Panel.  The report  date is March 02, 2020.  HRD testing was unable to be performed due to insufficient sample availability.   The CustomNext-Cancer+RNAinsight panel offered by [employer] includes sequencing and rearrangement analysis for the following 47 genes:  APC, ATM, AXIN2, BARD1, BMPR1A, BRCA1, BRCA2, BRIP1, CDH1, CDK4, CDKN2A, CHEK2, DICER1, EPCAM, GREM1, HOXB13, MEN1, MLH1, MSH2, MSH3, MSH6, MUTYH, NBN, NF1, NF2, NTHL1, PALB2, PMS2, POLD1, POLE, PTEN, RAD51C, RAD51D, RECQL, RET, SDHA, SDHAF2, SDHB, SDHC, SDHD, SMAD4, SMARCA4, STK11, TP53, TSC1, TSC2, and VHL.  RNA data is routinely analyzed for use in variant interpretation for all genes.   03/21/2020 Tumor Marker   Patient's tumor was tested for the following markers: CA-125 Results of the tumor marker test revealed 11.5   04/20/2020 Tumor Marker   Patient's  tumor was tested for the following markers: CA-125 Results of the tumor marker test revealed 9.3   04/24/2020 Imaging   1. Status post interval hysterectomy and oophorectomy. 2. Interval resolution of previously seen small volume ascites throughout the abdomen and pelvis. There is minimal peritoneal thickening, for example in the right paracolic gutter. 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 3. Stable small pulmonary nodules of the left lung base, measuring 3 mm, most likely incidental and benign. Continued attention on follow-up.     10/24/2020 Tumor Marker   Patient's tumor was tested for the following markers: CA-125. Results of the tumor marker test revealed 9.   01/23/2021 Tumor Marker   Patient's tumor was tested for the following markers: CA-125. Results of the tumor marker test revealed 8.4.   05/16/2021 Tumor Marker   Patient's tumor was tested for the following markers: CA-125. Results of the tumor marker test revealed 9.4.    Tumor Marker   Patient's tumor was tested for the following markers: CA-125. Results of the tumor marker test revealed 9.5.   11/28/2021 Imaging   Chest Impression:   1. No evidence of thoracic metastasis. 2. Three  small LEFT lobe pulmonary nodules unchanged from 2021   Abdomen / Pelvis Impression:   1. No evidence of ovarian carcinoma local recurrence. 2. No evidence of metastatic disease. 3. No free fluid or peritoneal nodularity.   03/05/2022 Tumor Marker   Patient's tumor was tested for the following markers: CA-125. Results of the tumor marker test revealed 9.1.   05/29/2022 Tumor Marker   Patient's tumor was tested for the following markers: CA-125. Results of the tumor marker test revealed 9.   07/01/2022 Imaging   CT ABDOMEN PELVIS W CONTRAST  Result Date: 06/30/2022 CLINICAL DATA:  Evaluate for ovarian cancer recurrence. * Tracking Code: BO * EXAM: CT ABDOMEN AND PELVIS WITH  CONTRAST TECHNIQUE: Multidetector CT imaging of the abdomen and pelvis was performed using the standard protocol following bolus administration of intravenous contrast. RADIATION DOSE REDUCTION: This exam was performed according to the departmental dose-optimization program which includes automated exposure control, adjustment of the mA and/or kV according to patient size and/or use of iterative reconstruction technique. CONTRAST:  OMNIPAQUE  IOHEXOL  300 MG/ML  SOLN COMPARISON:  11/27/2021 FINDINGS: Lower chest: No acute abnormality. There are 2 peripheral subpleural nodules in the lower lobe which are unchanged from previous exam. The largest measures 4 mm, image 9/4. Hepatobiliary: Unchanged cyst within dome of liver measuring 1.4 cm, image 7/2. No suspicious liver lesions. Gallbladder appears normal. No bile duct dilatation. Pancreas: Unremarkable. No pancreatic ductal dilatation or surrounding inflammatory changes. Spleen: Normal in size without focal abnormality. Adrenals/Urinary Tract: Normal adrenal glands.  No nephrolithiasis, hydronephrosis or suspicious mass. Urinary bladder is unremarkable. Stomach/Bowel: Stomach appears normal. No pathologic dilatation of the large or small bowel loops. No bowel wall thickening or inflammation. Vascular/Lymphatic: Aortic atherosclerosis. No enlarged abdominal or pelvic lymph nodes. Reproductive: Status post hysterectomy. No adnexal masses. Other: There is no ascites or focal fluid collections. There is no peritoneal nodularity or mass. Musculoskeletal: Degenerative disc disease is noted L3-4 and L4-5. There is an anterolisthesis L4 on L5 measuring 5 mm. No acute or suspicious osseous findings. IMPRESSION: 1. No acute findings within the abdomen or pelvis. 2. No specific findings identified to suggest residual or recurrent tumor or metastatic disease. 3. Small peripheral subpleural nodules in the lower lobe are unchanged from previous exam. These are favored to  represent a benign abnormality. 4. Lumbar spondylosis with anterolisthesis L4 on L5. 5.  Aortic Atherosclerosis (ICD10-I70.0). Electronically Signed   By: Kimberley Penman M.D.   On: 06/30/2022 16:19   MM 3D SCREENING MAMMOGRAM BILATERAL BREAST  Result Date: 06/24/2022 CLINICAL DATA:  Screening. EXAM: DIGITAL SCREENING BILATERAL MAMMOGRAM WITH TOMOSYNTHESIS AND CAD TECHNIQUE: Bilateral screening digital craniocaudal and mediolateral oblique mammograms were obtained. Bilateral screening digital breast tomosynthesis was performed. The images were evaluated with computer-aided detection. COMPARISON:  Previous exam(s). ACR Breast Density Category b: There are scattered areas of fibroglandular density. FINDINGS: In the right breast, a possible asymmetry warrants further evaluation. In the left breast, no findings suspicious for malignancy. IMPRESSION: Further evaluation is suggested for possible asymmetry in the right breast. RECOMMENDATION: Diagnostic mammogram and possibly ultrasound of the right breast. (Code:[id number]) The patient will be contacted regarding the findings, and additional imaging will be scheduled. BI-RADS CATEGORY  0: Incomplete: Need additional imaging evaluation. Electronically Signed   By: Alinda Apley M.D.   On: 06/24/2022 15:47      11/12/2022 Tumor Marker   Patient's tumor was tested for the following markers: CA-125. Results of the tumor marker test revealed 9.   02/18/2023 Tumor Marker   Patient's tumor was tested for the following markers: CA-`125. Results of the tumor marker test revealed 8.7.   06/26/2023 Tumor Marker   Patient's tumor was tested for the following markers: CA-125. Results of the tumor marker test revealed 11.     Relevant data reviewed during this visit included CBC, CMP, CA125

## 2023-07-14 ENCOUNTER — Encounter: Payer: Self-pay | Admitting: Hematology and Oncology

## 2023-07-14 ENCOUNTER — Ambulatory Visit
Admission: RE | Admit: 2023-07-14 | Discharge: 2023-07-14 | Disposition: A | Discharge status: Home / Self Care | Source: Ambulatory Visit | Attending: Internal Medicine | Admitting: Internal Medicine

## 2023-07-14 DIAGNOSIS — Z1231 Encounter for screening mammogram for malignant neoplasm of breast: Secondary | ICD-10-CM

## 2023-08-27 ENCOUNTER — Other Ambulatory Visit: Payer: Self-pay | Admitting: Gynecologic Oncology

## 2023-08-27 DIAGNOSIS — C569 Malignant neoplasm of unspecified ovary: Secondary | ICD-10-CM

## 2023-10-01 NOTE — Progress Notes (Incomplete)
 63 y.o. [id number] postmenopausal female here for annual exam. Married. PCP: Loreli Elsie JONETTA Mickey., MD   She reports ***. Urine sample provided: ***  Postmenopausal bleeding: *** Pelvic discharge or pain: *** Breast mass, nipple discharge or skin changes : *** Sexually active: ***   Last PAP:     Component Value Date/Time   DIAGPAP  03/10/2022 1451    - Negative for intraepithelial lesion or malignancy (NILM)   DIAGPAP  02/06/2021 0907    - Negative for intraepithelial lesion or malignancy (NILM)   HPVHIGH Negative 02/06/2021 0907   ADEQPAP Satisfactory for evaluation. 03/10/2022 1451   ADEQPAP  02/06/2021 0907    Satisfactory for evaluation; transformation zone component ABSENT.   Last mammogram: 07/14/23 density B, bi-rads 1 neg Last DXA: 11/25/22 *** Last colonoscopy: 08/27/18 55yr recall  Exercising: *** Smoker:***  Flowsheet Row Office Visit from 02/28/2019 in [facility]  PHQ-2 Total Score 0       GYN HISTORY: ***  OB History  Gravida Para Term Preterm AB Living  4 3   1 3   SAB IAB Ectopic Multiple Live Births          # Outcome Date GA Lbr Len/2nd Weight Sex Type Anes PTL Lv  4 AB           3 Para           2 Para           1 Para            Past Medical History:  Diagnosis Date   Allergy    Anemia    Broken foot 2022   left   Cancer (HCC)    ovarian   DVT (deep venous thrombosis) (HCC)    Heart murmur    benign never an issue   Numbness and tingling    Osteopenia 08/2016   T score -1.6 FRAX 7.8%/0.7%   Peripheral neuropathy, idiopathic    Pulmonary embolism (HCC)    Past Surgical History:  Procedure Laterality Date   fibroid in 2011,2003     IR IVC FILTER RETRIEVAL / S&I /IMG GUID/MOD SED  04/26/2020   IR RADIOLOGIST EVAL & MGMT  02/10/2020   miscarriage 1989     ovarian cyst 1992     Robotic-assisted laparoscopic total hysterectomy with bilateral salpingoophorectomy, omentectomy, radical tumor debulking   01/10/2020   Current  Outpatient Medications on File Prior to Visit  Medication Sig Dispense Refill   Alpha-Lipoic Acid 300 MG CAPS Take 1-2 capsules (300-600 mg total) by mouth once daily 60 capsule 11   cetirizine (ZYRTEC) 10 MG tablet Chew 10 mg by mouth daily.     Cholecalciferol 50 MCG (2000 UT) CAPS Take 2,000 Units by mouth daily.     cyanocobalamin  (VITAMIN B12) 1000 MCG tablet Take 1,000 mcg by mouth daily.     fluticasone (FLONASE) 50 MCG/ACT nasal spray Place 1 spray into both nostrils daily.     gabapentin  (NEURONTIN ) 300 MG capsule Take 1 capsule (300 mg total) by mouth 3 (three) times daily. 270 capsule 3   melatonin 5 MG TABS Take 5 mg by mouth.     meloxicam  (MOBIC ) 15 MG tablet Take 1 tablet (15 mg total) by mouth daily as needed. 30 tablet 5   methocarbamol  (ROBAXIN ) 500 MG tablet Take 1 tablet (500 mg total) by mouth every 8 (eight) hours as needed. 90 tablet 1   No current facility-administered medications on file prior  to visit.   Social History   Socioeconomic History   Marital status: Married    Spouse name: Lamar   Number of children: 3   Years of education: [employer] education level: Not on file  Occupational History   Occupation: PRESCHOOL TEACHER    Employer: [employer]  Tobacco Use   Smoking status: Never   Smokeless tobacco: Never  Vaping Use   Vaping status: Never Used  Substance and Sexual Activity   Alcohol  use: Yes    Comment: 3 a week   Drug use: No   Sexual activity: Yes    Partners: Male    Birth control/protection: Post-menopausal, Surgical    Comment: hysterectomy  Other Topics Concern   Not on file  Social History Narrative   Patient is married Beverlie) and lives at home with her husband. Married x 36 years.   Patient has three adult children; 2 in [place] (30, 42); one in [place] (25).  No grandchildren in 2019.SABRA   Patient is a Runner, broadcasting/film/video, working part-time. Pre-school Geophysicist/field seismologist. 16 hours per week.   Patient has a college education.   Patient is  right-handed.   Patient drinks one cup of coffee daily.   Pillates three times per week for sixteen years.     Social Drivers of Corporate investment banker Strain: Not on file  Food Insecurity: Not on file  Transportation Needs: Not on file  Physical Activity: Not on file  Stress: Not on file  Social Connections: Not on file  Intimate Partner Violence: Not on file   Family History  Problem Relation Age of Onset   Leukemia Mother 56   Arthritis Father    Colon cancer Father 86   Prostate cancer Father 50   Colon cancer Paternal Uncle 38   Breast cancer Maternal Grandmother 83   Leukemia Maternal Grandfather 7   Colon cancer Cousin 102       paternal female cousin   Throat cancer Cousin 28       paternal female cousin   Prostate cancer Cousin        paternal cousin; dx early 90s; metastatic   Esophageal cancer Neg Hx    Rectal cancer Neg Hx    Stomach cancer Neg Hx    Pancreatic cancer Neg Hx    No Known Allergies    PE There were no vitals filed for this visit. There is no height or weight on file to calculate BMI.  Physical Exam    Assessment and Plan:        There are no diagnoses linked to this encounter. Clotilda FORBES Pa, CMA

## 2023-10-05 ENCOUNTER — Ambulatory Visit: Admitting: Obstetrics and Gynecology

## 2023-10-21 ENCOUNTER — Other Ambulatory Visit (HOSPITAL_BASED_OUTPATIENT_CLINIC_OR_DEPARTMENT_OTHER): Payer: Self-pay

## 2023-10-21 MED ORDER — FLUZONE HIGH-DOSE 0.5 ML IM SUSY
0.5000 mL | PREFILLED_SYRINGE | Freq: Once | INTRAMUSCULAR | 0 refills | Status: AC
  Filled 2023-10-21: qty 0.5, 1d supply, fill #0

## 2023-10-30 ENCOUNTER — Ambulatory Visit: Payer: BLUE CROSS/BLUE SHIELD | Admitting: Gynecologic Oncology

## 2023-11-09 ENCOUNTER — Inpatient Hospital Stay: Attending: Hematology and Oncology

## 2023-11-09 DIAGNOSIS — Z90710 Acquired absence of both cervix and uterus: Secondary | ICD-10-CM | POA: Insufficient documentation

## 2023-11-09 DIAGNOSIS — C569 Malignant neoplasm of unspecified ovary: Secondary | ICD-10-CM

## 2023-11-09 DIAGNOSIS — D61818 Other pancytopenia: Secondary | ICD-10-CM

## 2023-11-09 DIAGNOSIS — Z9079 Acquired absence of other genital organ(s): Secondary | ICD-10-CM | POA: Insufficient documentation

## 2023-11-09 DIAGNOSIS — Z90722 Acquired absence of ovaries, bilateral: Secondary | ICD-10-CM | POA: Insufficient documentation

## 2023-11-09 DIAGNOSIS — R918 Other nonspecific abnormal finding of lung field: Secondary | ICD-10-CM

## 2023-11-09 DIAGNOSIS — R180 Malignant ascites: Secondary | ICD-10-CM

## 2023-11-09 DIAGNOSIS — Z8543 Personal history of malignant neoplasm of ovary: Secondary | ICD-10-CM | POA: Diagnosis present

## 2023-11-09 DIAGNOSIS — I824Z3 Acute embolism and thrombosis of unspecified deep veins of distal lower extremity, bilateral: Secondary | ICD-10-CM

## 2023-11-09 DIAGNOSIS — I2699 Other pulmonary embolism without acute cor pulmonale: Secondary | ICD-10-CM

## 2023-11-09 LAB — COMPREHENSIVE METABOLIC PANEL WITH GFR
ALT: 15 U/L (ref 0–44)
AST: 25 U/L (ref 15–41)
Albumin: 4.2 g/dL (ref 3.5–5.0)
Alkaline Phosphatase: 64 U/L (ref 38–126)
Anion gap: 4 — ABNORMAL LOW (ref 5–15)
BUN: 17 mg/dL (ref 8–23)
CO2: 32 mmol/L (ref 22–32)
Calcium: 9.4 mg/dL (ref 8.9–10.3)
Chloride: 104 mmol/L (ref 98–111)
Creatinine, Ser: 1.14 mg/dL — ABNORMAL HIGH (ref 0.44–1.00)
GFR, Estimated: 53 mL/min — ABNORMAL LOW (ref >60)
Glucose, Bld: 91 mg/dL (ref 70–99)
Potassium: 4.2 mmol/L (ref 3.5–5.1)
Sodium: 140 mmol/L (ref 135–145)
Total Bilirubin: 0.4 mg/dL (ref 0.0–1.2)
Total Protein: 7.1 g/dL (ref 6.5–8.1)

## 2023-11-09 LAB — CBC WITH DIFFERENTIAL/PLATELET
Abs Immature Granulocytes: 0.01 K/uL (ref 0.00–0.07)
Basophils Absolute: 0.1 K/uL (ref 0.0–0.1)
Basophils Relative: 1 %
Eosinophils Absolute: 0.2 K/uL (ref 0.0–0.5)
Eosinophils Relative: 3 %
HCT: 39.7 % (ref 36.0–46.0)
Hemoglobin: 13.4 g/dL (ref 12.0–15.0)
Immature Granulocytes: 0 %
Lymphocytes Relative: 30 %
Lymphs Abs: 1.8 K/uL (ref 0.7–4.0)
MCH: 30.2 pg (ref 26.0–34.0)
MCHC: 33.8 g/dL (ref 30.0–36.0)
MCV: 89.4 fL (ref 80.0–100.0)
Monocytes Absolute: 0.4 K/uL (ref 0.1–1.0)
Monocytes Relative: 7 %
Neutro Abs: 3.4 K/uL (ref 1.7–7.7)
Neutrophils Relative %: 59 %
Platelets: 272 K/uL (ref 150–400)
RBC: 4.44 MIL/uL (ref 3.87–5.11)
RDW: 12.8 % (ref 11.5–15.5)
WBC: 5.8 K/uL (ref 4.0–10.5)
nRBC: 0 % (ref 0.0–0.2)

## 2023-11-10 ENCOUNTER — Encounter: Payer: Self-pay | Admitting: Gynecologic Oncology

## 2023-11-10 LAB — CA 125: Cancer Antigen (CA) 125: 10.2 U/mL (ref 0.0–38.1)

## 2023-11-12 ENCOUNTER — Inpatient Hospital Stay: Admitting: Gynecologic Oncology

## 2023-11-12 ENCOUNTER — Encounter: Payer: Self-pay | Admitting: Gynecologic Oncology

## 2023-11-12 ENCOUNTER — Encounter: Payer: Self-pay | Admitting: Oncology

## 2023-11-12 VITALS — BP 138/86 | HR 64 | Temp 97.6°F | Resp 19 | Wt 160.6 lb

## 2023-11-12 DIAGNOSIS — Z8543 Personal history of malignant neoplasm of ovary: Secondary | ICD-10-CM

## 2023-11-12 DIAGNOSIS — M8580 Other specified disorders of bone density and structure, unspecified site: Secondary | ICD-10-CM

## 2023-11-12 DIAGNOSIS — C569 Malignant neoplasm of unspecified ovary: Secondary | ICD-10-CM

## 2023-11-12 NOTE — Patient Instructions (Signed)
 It was good to see you today.  I do not see or feel any evidence of cancer recurrence on your exam.  We will see you in March for follow-up  As always, if you develop any new and concerning symptoms before your next visit, please call to see me sooner.

## 2023-11-12 NOTE — Progress Notes (Signed)
 Gynecologic Oncology Return Clinic Visit  11/12/23  Reason for Visit: surveillance  Treatment History: Oncology History Overview Note  Neg Genetics, unable to do HRD   Ovarian cancer (HCC)  09/20/2019 Initial Diagnosis   Presented to her primary care doctor with abdominal bloating.  Abdominal x-ray imaging was unremarkable.   09/29/2019 Imaging   CT of the chest showed pulmonary embolism in all lobes.  Large right/central pelvic mass likely ovarian neoplasm with omental/peritoneal metastasis with significant free fluid in the abdomen and pelvis.  A small left uterine calcification could be seen in a degenerating fibroid.  Small right and left lung base nodules, could represent sequela of previous infection or inflammation with metastasis not excluded.   09/29/2019 - 10/15/2019 Hospital Admission   She presented to the emergency department in [place]  after complaints of leg pain with bilateral lower extremity edema.  D-dimer was elevated.  Lower extremity ultrasound demonstrated extensive bilateral DVT involving peroneal and soleal calf veins on the left and peroneal vein on the right.  CT abdomen and pelvis showed large pelvic mass with omental metastasis and ascites, worrisome for ovarian cancer.  CT imaging of the chest showed bilateral pulmonary nodules as well as evidence of pulmonary embolism.  She received anticoagulation therapy.  She was also found to be anemic, worrisome that she might have bleeding in the tumor and received transfusion.  Echocardiogram showed preserved ejection fraction but evidence of moderate right ventricular strain.  Subsequent biopsy showed cancer suspicious for ovarian primary and she received first cycle of neoadjuvant chemotherapy with carboplatin  and taxol . She had IVC filter placement and temporary TPN   09/30/2019 Procedure   Ultrasound paracentesis was performed complicated by bleeding and received blood transfusion   09/30/2019 Procedure   She had IVC filter  placement by interventional radiologist in [place] .   09/30/2019 Echocardiogram   Outside echocardiogram showed left ventricular ejection fraction around 79%.  Concentric left ventricular modeling.  Grade 1 diastolic pattern with normal left atrial pressure.  Right ventricle size is mildly enlarged.  Right ventricular systolic function is moderately reduced.  Right ventricle was not well visualized.  Aortic sclerosis without evidence of stenosis.  Moderate pleural effusion on the left region   10/02/2019 Procedure   EGD was performed which showed no evidence of acute bleeding.  Biopsy of the stomach showed benign gastric mucosa with reactive gastropathy as may be seen in the healing phase of erosive gastritis.  Helicobacter organism testing is negative..   10/03/2019 Procedure   She underwent CT-guided biopsy of the pelvic mass.   10/03/2019 Tumor Marker   CEA level was 2.9.   10/03/2019 Pathology Results   Biopsy show adenocarcinoma.  The malignant cells stain positive for keratin AE1/AE3, CK7 and MOC-31.  Rare cells show weak reactivity with p63 and GA TA 3.  Cells are negative for CK20, calretinin, CDX2, TTF-1, ER and WT-1.  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  Possible consideration include, but not limited to, gynecological origin, pancreatico biliary and urothelial.   10/03/2019 Tumor Marker   Patient's tumor was tested for the following markers: CA-125 Results of the tumor marker test revealed 713 CA19-9 is 100   10/04/2019 Imaging   Tagged red blood cell scan equivocal for source of bleeding.   10/05/2019 Procedure   She underwent paracentesis.  IR removed 4100 cc of ascites fluid with significant blood noted.   10/05/2019 Imaging   CT abdomen and pelvis angiogram protocol  show no obvious source of bleeding.   10/05/2019 Imaging   CT imaging of the abdomen and pelvis showed no evidence of  postprocedural hemorrhage or discrete area of GI bleeding.  Large volume ascites is present.  Small pockets of intraperitoneal free air are present, likely related to recent paracentesis.  Abnormal mass in the pelvic region most likely a neoplasm of uterine or ovarian origin.  Normal liver with benign cysts of the right hepatic dome.  Biliary tree and pancreas and spleen were age-appropriate   10/07/2019 Procedure   Ultrasound paracentesis was performed.   10/07/2019 -  Chemotherapy   The patient had neoadjuvant carboplatin  and taxol  for chemotherapy treatment.     10/08/2019 Imaging   CT angiogram of the abdominal aorta and iliofemoral test was done showing 14 x 10 x 14 cm pelvic mass with mild to moderate ascites.  Normal appearance of arterial structures without evidence of compression.  No evidence of compression of the iliac vein or IVC.   10/11/2019 Imaging   Ultrasound shows gallbladder full of sludge.  Fatty liver changes.  Ascites present.   10/18/2019 Cancer Staging   Staging form: Ovary, Fallopian Tube, and Primary Peritoneal Carcinoma, AJCC 8th Edition - Clinical stage from 10/18/2019: FIGO Stage IIIC, calculated as Stage Unknown (cT3c, cNX, cM0) - Signed by Lonn Hicks, MD on 10/18/2019   10/21/2019 Tumor Marker   Patient's tumor was tested for the following markers: CA-125. Results of the tumor marker test revealed 257.   10/31/2019 - 03/21/2020 Chemotherapy   The patient had carboplatin  and taxol  for chemotherapy treatment.     11/22/2019 Tumor Marker   Patient's tumor was tested for the following markers: CA-125. Results of the tumor marker test revealed 30.7   12/12/2019 Imaging   1. Today's study demonstrates a positive response to therapy with regression of right ovarian lesion(s), and decreased volume of presumably malignant ascites. 2. No definitive evidence of metastatic disease in the thorax. Multiple tiny 2-4 mm pulmonary nodules are stable compared to prior  examinations. Continued attention on follow-up studies is recommended to exclude the possibility of metastatic lesions    12/26/2019 Tumor Marker   Patient's tumor was tested for the following markers: CA-125 Results of the tumor marker test revealed 16.4   01/10/2020 Surgery   Surgeon: Eloy Maurilio Fitch    Assistants: Dr Olam Mill (an MD assistant was necessary for tissue manipulation, management of robotic instrumentation, retraction and positioning due to the complexity of the case and hospital policies).  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    Post-operative Diagnosis: same   Operation: Robotic-assisted laparoscopic total hysterectomy with bilateral salpingoophorectomy, omentectomy, radical tumor debulking     Operative Findings:  : bulky right ovarian mass (approximately 6cm), mild nodularity to left ovary, tumor nodules on peritoneum of pelvis (all resected), no gross omental disease. No gross residual tumor at the completion of the procedure representing a complete (optimal) cytoreduction   01/10/2020 Pathology Results   Clinical History: Ovarian cancer ([facility])   FINAL MICROSCOPIC DIAGNOSIS:   A. UTERUS, CERVIX, BILATERAL TUBES AND OVARIES:  - Right ovary:       Microscopic foci of adenocarcinoma associated with extensive necrosis and hemosiderin deposition.       No ovarian surface involvement identified.       Paraovarian necrotic and hemorrhagic nodule.       See oncology table and comment.  - Left Fallopian  tube:       Microscopic focus of adenocarcinoma involving submucosa.       See comment.  - Cervix:       Nabothian cyst.       No dysplasia or malignancy.  - Endometrium:       Inactive.       No hyperplasia or malignancy.  - Right Fallopian tube:       Adhesions with focal hemosiderin deposition.       No malignancy identified.  - Left ovary:        Endosalpingosis.       No malignancy identified.   B. PERITONEAL NODULE, BIOPSY:  - Necrotic and hemorrhagic nodule with hemosiderin deposition.  - No viable malignancy identified.   C. OMENTUM:  - Foci of fibrosis associated with adhesions and hemosiderin deposition.  - No malignancy identified.  - One benign lymph node (0/1).   ONCOLOGY TABLE:  OVARY or FALLOPIAN TUBE or PRIMARY PERITONEUM: Resection  Procedure: Hysterectomy with bilateral salpingo-oophorectomy, peritoneal nodule biopsy and omentectomy.  Specimen Integrity: Intact.  Tumor Site: Right ovary.  Tumor Size: 5.2 x 4.5 x 3.8 cm, see comment.  Histologic Type: Endometrioid adenocarcinoma.  Histologic Grade: FIGO grade 2.  Ovarian Surface Involvement: Not identified.  Fallopian Tube Surface Involvement: Not identified.  See comment.  Other Tissue/ Organ Involvement: Not identified.  Peritoneal/Ascitic Fluid Involvement: Not applicable.  Chemotherapy Response Score (CRS): Near complete response, CRS 3.  See  comment.  Regional Lymph Nodes: No lymph nodes submitted.  Distant Metastasis:       Distant Site(s) Involved: Not identified.  Pathologic Stage Classification (pTNM, AJCC 8th Edition): ypT2a, ypNX  Ancillary Studies: Can be performed if requested.  Representative Tumor Block: A6 and A7.    02/29/2020 Tumor Marker   Patient's tumor was tested for the following markers: CA-125 Results of the tumor marker test revealed 14.2   03/02/2020 Genetic Testing   Negative hereditary cancer genetic testing: no pathogenic variants detected in [facility] CustomNext-Cancer +RNAinsight Panel.  The report date is March 02, 2020.  HRD testing was unable to be performed due to insufficient sample availability.   The CustomNext-Cancer+RNAinsight panel offered by [employer] includes sequencing and rearrangement analysis for the following 47 genes:  APC, ATM, AXIN2, BARD1, BMPR1A, BRCA1, BRCA2, BRIP1, CDH1, CDK4, CDKN2A, CHEK2,  DICER1, EPCAM, GREM1, HOXB13, MEN1, MLH1, MSH2, MSH3, MSH6, MUTYH, NBN, NF1, NF2, NTHL1, PALB2, PMS2, POLD1, POLE, PTEN, RAD51C, RAD51D, RECQL, RET, SDHA, SDHAF2, SDHB, SDHC, SDHD, SMAD4, SMARCA4, STK11, TP53, TSC1, TSC2, and VHL.  RNA data is routinely analyzed for use in variant interpretation for all genes.   03/21/2020 Tumor Marker   Patient's tumor was tested for the following markers: CA-125 Results of the tumor marker test revealed 11.5   04/20/2020 Tumor Marker   Patient's tumor was tested for the following markers: CA-125 Results of the tumor marker test revealed 9.3   04/24/2020 Imaging   1. Status post interval hysterectomy and oophorectomy. 2. Interval resolution of previously seen small volume ascites throughout the abdomen and pelvis. There is minimal peritoneal thickening, for example in the right paracolic gutter. 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 3. Stable small pulmonary nodules of the left lung base, measuring 3 mm, most likely incidental and benign. Continued attention on follow-up.     10/24/2020 Tumor Marker   Patient's tumor was tested for the following markers: CA-125. Results of the tumor marker test revealed 9.   01/23/2021  Tumor Marker   Patient's tumor was tested for the following markers: CA-125. Results of the tumor marker test revealed 8.4.   05/16/2021 Tumor Marker   Patient's tumor was tested for the following markers: CA-125. Results of the tumor marker test revealed 9.4.    Tumor Marker   Patient's tumor was tested for the following markers: CA-125. Results of the tumor marker test revealed 9.5.   11/28/2021 Imaging   Chest Impression:   1. No evidence of thoracic metastasis. 2. Three  small LEFT lobe pulmonary nodules unchanged from 2021   Abdomen / Pelvis Impression:   1. No evidence of ovarian carcinoma local recurrence. 2. No evidence of metastatic disease. 3. No free fluid or peritoneal  nodularity.   03/05/2022 Tumor Marker   Patient's tumor was tested for the following markers: CA-125. Results of the tumor marker test revealed 9.1.   05/29/2022 Tumor Marker   Patient's tumor was tested for the following markers: CA-125. Results of the tumor marker test revealed 9.   07/01/2022 Imaging   CT ABDOMEN PELVIS W CONTRAST  Result Date: 06/30/2022 CLINICAL DATA:  Evaluate for ovarian cancer recurrence. * Tracking Code: BO * EXAM: CT ABDOMEN AND PELVIS WITH CONTRAST TECHNIQUE: Multidetector CT imaging of the abdomen and pelvis was performed using the standard protocol following bolus administration of intravenous contrast. RADIATION DOSE REDUCTION: This exam was performed according to the departmental dose-optimization program which includes automated exposure control, adjustment of the mA and/or kV according to patient size and/or use of iterative reconstruction technique. CONTRAST:  OMNIPAQUE  IOHEXOL  300 MG/ML  SOLN COMPARISON:  11/27/2021 FINDINGS: Lower chest: No acute abnormality. There are 2 peripheral subpleural nodules in the lower lobe which are unchanged from previous exam. The largest measures 4 mm, image 9/4. Hepatobiliary: Unchanged cyst within dome of liver measuring 1.4 cm, image 7/2. No suspicious liver lesions. Gallbladder appears normal. No bile duct dilatation. Pancreas: Unremarkable. No pancreatic ductal dilatation or surrounding inflammatory changes. Spleen: Normal in size without focal abnormality. Adrenals/Urinary Tract: Normal adrenal glands. No nephrolithiasis, hydronephrosis or suspicious mass. Urinary bladder is unremarkable. Stomach/Bowel: Stomach appears normal. No pathologic dilatation of the large or small bowel loops. No bowel wall thickening or inflammation. Vascular/Lymphatic: Aortic atherosclerosis. No enlarged abdominal or pelvic lymph nodes. Reproductive: Status post hysterectomy. No adnexal masses. Other: There is no ascites or focal fluid collections.  There is no peritoneal nodularity or mass. Musculoskeletal: Degenerative disc disease is noted L3-4 and L4-5. There is an anterolisthesis L4 on L5 measuring 5 mm. No acute or suspicious osseous findings. IMPRESSION: 1. No acute findings within the abdomen or pelvis. 2. No specific findings identified to suggest residual or recurrent tumor or metastatic disease. 3. Small peripheral subpleural nodules in the lower lobe are unchanged from previous exam. These are favored to represent a benign abnormality. 4. Lumbar spondylosis with anterolisthesis L4 on L5. 5.  Aortic Atherosclerosis (ICD10-I70.0). Electronically Signed   By: Waddell Calk M.D.   On: 06/30/2022 16:19   MM 3D SCREENING MAMMOGRAM BILATERAL BREAST  Result Date: 06/24/2022 CLINICAL DATA:  Screening. EXAM: DIGITAL SCREENING BILATERAL MAMMOGRAM WITH TOMOSYNTHESIS AND CAD TECHNIQUE: Bilateral screening digital craniocaudal and mediolateral oblique mammograms were obtained. Bilateral screening digital breast tomosynthesis was performed. The images were evaluated with computer-aided detection. COMPARISON:  Previous exam(s). ACR Breast Density Category b: There are scattered areas of fibroglandular density. FINDINGS: In the right breast, a possible asymmetry warrants further evaluation. In the left breast, no findings suspicious for malignancy. IMPRESSION: Further  evaluation is suggested for possible asymmetry in the right breast. RECOMMENDATION: Diagnostic mammogram and possibly ultrasound of the right breast. (Code:[id number]) The patient will be contacted regarding the findings, and additional imaging will be scheduled. BI-RADS CATEGORY  0: Incomplete: Need additional imaging evaluation. Electronically Signed   By: Rosaline Collet M.D.   On: 06/24/2022 15:47      11/12/2022 Tumor Marker   Patient's tumor was tested for the following markers: CA-125. Results of the tumor marker test revealed 9.   02/18/2023 Tumor Marker   Patient's tumor was tested  for the following markers: CA-`125. Results of the tumor marker test revealed 8.7.   06/26/2023 Tumor Marker   Patient's tumor was tested for the following markers: CA-125. Results of the tumor marker test revealed 11.   11/10/2023 Tumor Marker   Patient's tumor was tested for the following markers: CA-125. Results of the tumor marker test revealed 10.2.    The patient reported feeling vague symptoms of abdominal bloating and early satiety in late August and early September 2021.  She attributed this to some decreased activity due to her recent fractured toe.  She mentioned to her primary care physician who performed a pelvic exam which was unremarkable at that time and ordered a plain abdominal x-ray which showed stool burden but no other changes.  The patient subsequently departed for a planned trip to [place]  [place]  While out camping in [place]  she developed severe shortness of breath and progressive abdominal distention and discomfort.  This caught her camping trip short and she was seen by local emergency department in [place]  where she was admitted and CT scans were performed.  The CT scans showed pulmonary embolisms in all lobes.  A large right central pelvic mass which is likely an ovarian neoplasm measuring at least 11 cm.  There were omental and peritoneal metastases with significant ascites.  A small left uterine calcification consistent with a fibroid was seen.  There was small right and left lung base nodules which could represent sequelae of previous infection or possible metastases.   She was admitted to a hospital in [place]  and an IVC filter was placed on September 30, 2019. Tumor markers on October 03, 2019 included a Ca1 25 which was elevated at 713.  CA 19-9 was elevated to 100.  CEA was normal at 2.9. Paracentesis was performed on September 30, 2019 and showed adenocarcinoma. CT-guided biopsy of the pelvic mass on October 03, 2019 showed  adenocarcinoma with malignant stains positive for keratin, CK7 and MOC-31.  There were negative for CK20 calretinin CDX2, ER, and WT 1.  The findings were consistent with a poorly differentiated adenocarcinoma of possible gynecologic primary.  Pancreas biliary urothelial primaries could also be considered.   She received her first cycle of carboplatin  and paclitaxel  as an inpatient in [place]  on October 23, 2019.  She had been started on Eliquis  for the DVT and PE.  An IVC filter had been placed as stated above.   She was able to return to [facility] from [place]  after her 1st cycle of chemotherapy and established care with Dr Eloy and Dr Lonn from [facility].   She completed her 4th cycle of chemotherapy with carboplatin  and paclitaxel  on 12/13/19.  CA 125 on 11/16/19, which was day 1 of cycle 3, was normal at 30.   CT chest, abdomen and pelvis on 12/12/19 showed a solid mass in the right ovary measuring 4.2  x 4.2 x 3.3 cm. Uterus and left ovary are unremarkable in appearance. Posteriorly to this there was a 4.9 x 4.1 x 4.6 cm macrolobulated cystic lesion with multiple thick internal septations. Overall, the combined size of these lesions or this lesion appears decreased compared to the prior examination from 10/05/2019, indicating a positive response to therapy.No new lesions were seen. There was reduction in ascites present.   It was determined that she had demonstrated good partial response to 4 cycles of neoadjuvant chemotherapy, a 3 month time period had elapsed since her PE, and was then a good candidate for interval surgical cytoreduction.    On 01/10/20 she underwent robotic assisted total hysterectomy, BSO, omentectomy. Intraoperative findings were significant for a bulky right ovarian mass measuring approximately 6 cm, mild nodularity to the left ovary, tumor nodules on the peritoneum of the pelvis which were all resected, no gross omental disease.  No gross residual tumor at the  completion of the procedure representing complete, optimal cytoreduction. Surgery was uncomplicated.  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  The focus of cancer that was residual measured 2 mm in greatest dimension.  There was a 1 mm focus of metastatic cancer in the left fallopian tube.  All other foci appeared necrotic with hemorrhagic nodules with no viable tissue remaining.  Chemotherapy response score was 3 with near complete response.   BRCA negative on genetics. CA 125 revealed normalization at the completion of therapy (14.2 on 02/29/20).  Interval History: Doing well.  Denies any abdominal or pelvic pain.  Reports normal bowel and bladder function.  Past Medical/Surgical History: Past Medical History:  Diagnosis Date   Allergy    Anemia    Broken foot 2022   left   Cancer (HCC)    ovarian   DVT (deep venous thrombosis) (HCC)    Heart murmur    benign never an issue   Numbness and tingling    Osteopenia 08/2016   T score -1.6 FRAX 7.8%/0.7%   Peripheral neuropathy, idiopathic    Pulmonary embolism (HCC)     Past Surgical History:  Procedure Laterality Date   fibroid in 2011,2003     IR IVC FILTER RETRIEVAL / S&I /IMG GUID/MOD SED  04/26/2020   IR RADIOLOGIST EVAL & MGMT  02/10/2020   miscarriage 1989     ovarian cyst 1992     Robotic-assisted laparoscopic total hysterectomy with bilateral salpingoophorectomy, omentectomy, radical tumor debulking   01/10/2020    Family History  Problem Relation Age of Onset   Leukemia Mother 84   Arthritis Father    Colon cancer Father 87   Prostate cancer Father 1   Colon cancer Paternal Uncle 61   Breast cancer Maternal Grandmother 67   Leukemia Maternal Grandfather 3   Colon cancer Cousin 36       paternal female cousin   Throat cancer Cousin 51       paternal female cousin   Prostate cancer Cousin        paternal cousin; dx early 51s; metastatic   Esophageal cancer Neg Hx     Rectal cancer Neg Hx    Stomach cancer Neg Hx    Pancreatic cancer Neg Hx     Social History   Socioeconomic History   Marital status: Married    Spouse name: Lamar   Number of children: 3   Years of education: College   Highest education level: Not on file  Occupational History  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 Married x 36 years.   Patient has three adult children; 2 in [place] (30, 27); one in [place] (25).  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   Patient has a college education.   Patient is right-handed.   Patient drinks one cup of coffee daily.   Pillates three times per week for sixteen years.     Social Drivers of Corporate Investment Banker Strain: Not on file  Food Insecurity: Not on file  Transportation Needs: Not on file  Physical Activity: Not on file  Stress: Not on file  Social Connections: Not on file    Current Medications:  Current Outpatient Medications:    Alpha-Lipoic Acid 300 MG CAPS, Take 1-2 capsules (300-600 mg total) by mouth once daily, Disp: 60 capsule, Rfl: 11   cetirizine (ZYRTEC) 10 MG tablet, Chew 10 mg by mouth daily., Disp: , Rfl:    Cholecalciferol 50 MCG (2000 UT) CAPS, Take 2,000 Units by mouth daily., Disp: , Rfl:    cyanocobalamin  (VITAMIN B12) 1000 MCG tablet, Take 1,000 mcg by mouth daily., Disp: , Rfl:    fluticasone (FLONASE) 50 MCG/ACT nasal spray, Place 1 spray into both nostrils daily.,  Disp: , Rfl:    gabapentin  (NEURONTIN ) 300 MG capsule, Take 1 capsule (300 mg total) by mouth 3 (three) times daily., Disp: 270 capsule, Rfl: 3   melatonin 5 MG TABS, Take 5 mg by mouth., Disp: , Rfl:    meloxicam  (MOBIC ) 15 MG tablet, Take 1 tablet (15 mg total) by mouth daily as needed. (Patient taking differently: Take 15 mg by mouth as needed.), Disp: 30 tablet, Rfl: 5   methocarbamol  (ROBAXIN ) 500 MG tablet, Take 1 tablet (500 mg total) by mouth every 8 (eight) hours as needed. (Patient taking differently: Take 500 mg by mouth every 6 (six) hours as needed.), Disp: 90 tablet, Rfl: 1  Review of Systems: Denies appetite changes, fevers, chills, fatigue, unexplained weight changes. Denies hearing loss, neck lumps or masses, mouth sores, ringing in ears or voice changes. Denies cough or wheezing.  Denies shortness of breath. Denies chest pain or palpitations. Denies leg swelling. Denies abdominal distention, pain, blood in stools, constipation, diarrhea, nausea, vomiting, or early satiety. Denies pain with intercourse, dysuria, frequency, hematuria or incontinence. Denies hot flashes, pelvic pain, vaginal bleeding or vaginal discharge.   Denies joint pain, back pain or muscle pain/cramps. Denies itching, rash, or wounds. Denies dizziness, headaches, numbness or seizures. Denies swollen lymph nodes or glands, denies easy bruising or bleeding. Denies anxiety, depression, confusion, or decreased concentration.  Physical Exam: BP (!) 143/93 (BP Location: Right Arm, Patient Position: Sitting)   Pulse 64   Temp 97.6 F (36.4 C) (Oral)   Resp 19   Wt 160 lb 9.6 oz (72.8 kg)   SpO2 96%   BMI 28.45 kg/m  General: Alert,  oriented, no acute distress. HEENT: Normocephalic, atraumatic, sclera anicteric. Chest: Clear to auscultation bilaterally.  No wheezes or rhonchi. Cardiovascular: Regular rate and rhythm, no murmurs. Abdomen: soft, nontender.  Normoactive bowel sounds.  No masses or  hepatosplenomegaly appreciated.  Extremities: Grossly normal range of motion.  Warm, well perfused.  No edema bilaterally. Skin: No rashes or lesions noted. Lymphatics: No cervical, supraclavicular, or inguinal adenopathy. GU: Normal appearing external genitalia without erythema, excoriation, or lesions.  Speculum exam reveals mildly atrophic vaginal mucosa, cuff intact, no lesions or masses visible.  Bimanual exam reveals cuff intact, no nodularity or masses.  Rectovaginal exam confirms findings.  Laboratory & Radiologic Studies:          Component Ref Range & Units (hover) 3 d ago (11/09/23) 4 mo ago (06/25/23) 8 mo ago (02/17/23) 1 yr ago (11/11/22) 1 yr ago (05/27/22) 1 yr ago (03/03/22) 1 yr ago (11/13/21)  Cancer Antigen (CA) 125 10.2 11.0 CM 8.7 CM 9.0 CM 9.0 CM 9.1 CM 9.5      Assessment & Plan: Chermaine Schnyder is a 63 y.o. 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 s/p neoadjuvant chemotherapy, interval cytoreduction (complete) on 01/10/20, adjuvant carboplatin  and paclitaxel  completed on 03/21/20.   She is overall doing well.  NED on exam today.   We discussed recent CA-125 (normal).   She has osteopenia on latest bone scan.  She is taking vitamin D  and doing weightbearing exercises.  Discussed addition of calcium.  She also raise the question of estrogen replacement therapy.  Her initial cytology from diagnosis looks like it was ER negative.  I will have Darice add ER testing to the patient's pathology from her surgery in 2021.  If this is negative, I think it would be safe from an oncologic standpoint to start ERT for bone health if she so decides.  We will continue with follow-up alternating between Dr. Lonn in our office.  The patient will see Eleanor for follow-up in March. She sees Dr. Lonn in June.    We reviewed signs and symptoms that would be concerning for cancer recurrence, and I stressed the importance of calling  if she develops any of these.  22 minutes of total time was spent for this patient encounter, including preparation, face-to-face counseling with the patient and coordination of care, and documentation of the encounter.  Comer Dollar, MD  Division of Gynecologic Oncology  Department of Obstetrics and Gynecology  [facility]

## 2023-11-12 NOTE — Progress Notes (Signed)
 Requested ER testing on accession [id number] with [facility] per Dr. Viktoria.

## 2023-11-18 LAB — SURGICAL PATHOLOGY

## 2023-11-20 ENCOUNTER — Encounter: Payer: Self-pay | Admitting: Gynecologic Oncology

## 2023-11-26 NOTE — Progress Notes (Incomplete)
 63 y.o. [id number] postmenopausal female here for annual exam. Married. PCP: Loreli Elsie JONETTA Mickey., MD   She reports ***. Urine sample provided: ***  Postmenopausal bleeding: *** Pelvic discharge or pain: *** Breast mass, nipple discharge or skin changes : *** Sexually active: ***   Last PAP:     Component Value Date/Time   DIAGPAP  03/10/2022 1451    - Negative for intraepithelial lesion or malignancy (NILM)   DIAGPAP  02/06/2021 0907    - Negative for intraepithelial lesion or malignancy (NILM)   HPVHIGH Negative 02/06/2021 0907   ADEQPAP Satisfactory for evaluation. 03/10/2022 1451   ADEQPAP  02/06/2021 0907    Satisfactory for evaluation; transformation zone component ABSENT.   Last mammogram: 07/14/23 Birads 1, density B Last DXA: 11/25/22 *** Last colonoscopy: 08/27/18 q5y  Exercising: *** Smoker:***  Flowsheet Row Office Visit from 02/28/2019 in [facility] [place]  PHQ-2 Total Score 0       GYN HISTORY: ***  OB History  Gravida Para Term Preterm AB Living  4 3   1 3   SAB IAB Ectopic Multiple Live Births          # Outcome Date GA Lbr Len/2nd Weight Sex Type Anes PTL Lv  4 AB           3 Para           2 Para           1 Para            Past Medical History:  Diagnosis Date   Allergy    Anemia    Broken foot 2022   left   Cancer (HCC)    ovarian   DVT (deep venous thrombosis) (HCC)    Heart murmur    benign never an issue   Numbness and tingling    Osteopenia 08/2016   T score -1.6 FRAX 7.8%/0.7%   Peripheral neuropathy, idiopathic    Pulmonary embolism (HCC)    Past Surgical History:  Procedure Laterality Date   fibroid in 2011,2003     IR IVC FILTER RETRIEVAL / S&I /IMG GUID/MOD SED  04/26/2020   IR RADIOLOGIST EVAL & MGMT  02/10/2020   miscarriage 1989     ovarian cyst 1992     Robotic-assisted laparoscopic total hysterectomy with bilateral salpingoophorectomy, omentectomy, radical tumor debulking   01/10/2020   Current Outpatient  Medications on File Prior to Visit  Medication Sig Dispense Refill   Alpha-Lipoic Acid 300 MG CAPS Take 1-2 capsules (300-600 mg total) by mouth once daily 60 capsule 11   cetirizine (ZYRTEC) 10 MG tablet Chew 10 mg by mouth daily.     Cholecalciferol 50 MCG (2000 UT) CAPS Take 2,000 Units by mouth daily.     cyanocobalamin  (VITAMIN B12) 1000 MCG tablet Take 1,000 mcg by mouth daily.     fluticasone (FLONASE) 50 MCG/ACT nasal spray Place 1 spray into both nostrils daily.     gabapentin  (NEURONTIN ) 300 MG capsule Take 1 capsule (300 mg total) by mouth 3 (three) times daily. 270 capsule 3   melatonin 5 MG TABS Take 5 mg by mouth.     meloxicam  (MOBIC ) 15 MG tablet Take 1 tablet (15 mg total) by mouth daily as needed. (Patient taking differently: Take 15 mg by mouth as needed.) 30 tablet 5   methocarbamol  (ROBAXIN ) 500 MG tablet Take 1 tablet (500 mg total) by mouth every 8 (eight) hours as needed. (Patient taking differently: Take  500 mg by mouth every 6 (six) hours as needed.) 90 tablet 1   No current facility-administered medications on file prior to visit.   Social History   Socioeconomic History   Marital status: Married    Spouse name: Lamar   Number of children: 3   Years of education: [employer] education level: Not on file  Occupational History   Occupation: PRESCHOOL TEACHER    Employer: [employer]  Tobacco Use   Smoking status: Never   Smokeless tobacco: Never  Vaping Use   Vaping status: Never Used  Substance and Sexual Activity   Alcohol  use: Yes    Comment: 3 a week   Drug use: No   Sexual activity: Yes    Partners: Male    Birth control/protection: Post-menopausal, Surgical    Comment: hysterectomy  Other Topics Concern   Not on file  Social History Narrative   Patient is married Beverlie) and lives at home with her husband. Married x 36 years.   Patient has three adult children; 2 in [place] (30, 32); one in [place] (25).  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   Patient has a college education.   Patient is right-handed.   Patient drinks one cup of coffee daily.   Pillates three times per week for sixteen years.     Social Drivers of Corporate Investment Banker Strain: Not on file  Food Insecurity: Not on file  Transportation Needs: Not on file  Physical Activity: Not on file  Stress: Not on file  Social Connections: Not on file  Intimate Partner Violence: Not on file   Family History  Problem Relation Age of Onset   Leukemia Mother 46   Arthritis Father    Colon cancer Father 21   Prostate cancer Father 6   Colon cancer Paternal Uncle 75   Breast cancer Maternal Grandmother 47   Leukemia Maternal Grandfather 66   Colon cancer Cousin 46       paternal female cousin   Throat cancer Cousin 64       paternal female cousin   Prostate cancer Cousin        paternal cousin; dx early 56s; metastatic   Esophageal cancer Neg Hx    Rectal cancer Neg Hx    Stomach cancer Neg Hx    Pancreatic cancer Neg Hx    No Known Allergies    PE There were no vitals filed for this visit. There is no height or weight on file to calculate BMI.  Physical Exam    Assessment and Plan:        There are no diagnoses linked to this encounter. Clotilda FORBES Pa, CMA

## 2023-11-30 ENCOUNTER — Ambulatory Visit: Admitting: Obstetrics and Gynecology

## 2023-12-07 ENCOUNTER — Other Ambulatory Visit: Payer: Self-pay | Admitting: Internal Medicine

## 2023-12-07 DIAGNOSIS — I700 Atherosclerosis of aorta: Secondary | ICD-10-CM

## 2023-12-17 ENCOUNTER — Ambulatory Visit
Admission: RE | Admit: 2023-12-17 | Discharge: 2023-12-17 | Disposition: A | Source: Ambulatory Visit | Attending: Internal Medicine | Admitting: Internal Medicine

## 2023-12-17 DIAGNOSIS — I700 Atherosclerosis of aorta: Secondary | ICD-10-CM

## 2023-12-21 ENCOUNTER — Encounter: Payer: Self-pay | Admitting: Obstetrics and Gynecology

## 2023-12-21 ENCOUNTER — Ambulatory Visit: Admitting: Obstetrics and Gynecology

## 2023-12-21 VITALS — BP 144/82 | HR 62 | Temp 98.0°F | Ht 64.0 in | Wt 160.0 lb

## 2023-12-21 DIAGNOSIS — Z01419 Encounter for gynecological examination (general) (routine) without abnormal findings: Secondary | ICD-10-CM | POA: Insufficient documentation

## 2023-12-21 DIAGNOSIS — Z9189 Other specified personal risk factors, not elsewhere classified: Secondary | ICD-10-CM | POA: Diagnosis not present

## 2023-12-21 DIAGNOSIS — Z1331 Encounter for screening for depression: Secondary | ICD-10-CM | POA: Diagnosis not present

## 2023-12-21 DIAGNOSIS — C561 Malignant neoplasm of right ovary: Secondary | ICD-10-CM

## 2023-12-21 DIAGNOSIS — M85851 Other specified disorders of bone density and structure, right thigh: Secondary | ICD-10-CM

## 2023-12-21 NOTE — Assessment & Plan Note (Signed)
 Diagnosed 2021 Followed by GYN ONC twice a year and ONC yearly with surveillance of TM Unfortunately she does not meet criteria for high risk medicare breast and pelvic exams annually Medicare will only pay for an exam q3yr, I still recommend annual GYN exams, however she is aware that she may have to pay for this exams if insurance does not cover them She will discuss this with GYN ONC at her next follow-up and schedule her exams with me accordingly

## 2023-12-21 NOTE — Patient Instructions (Signed)
 For patients under 50-65yo, I recommend 1200mg  calcium  daily and 600IU of vitamin D daily. For patients over 65yo, I recommend 1200mg  calcium  daily and 800IU of vitamin D daily.  Health Maintenance, Female Adopting a healthy lifestyle and getting preventive care are important in promoting health and wellness. Ask your health care provider about: The right schedule for you to have regular tests and exams. Things you can do on your own to prevent diseases and keep yourself healthy. What should I know about diet, weight, and exercise? Eat a healthy diet  Eat a diet that includes plenty of vegetables, fruits, low-fat dairy products, and lean protein. Do not eat a lot of foods that are high in solid fats, added sugars, or sodium. Maintain a healthy weight Body mass index (BMI) is used to identify weight problems. It estimates body fat based on height and weight. Your health care provider can help determine your BMI and help you achieve or maintain a healthy weight. Get regular exercise Get regular exercise. This is one of the most important things you can do for your health. Most adults should: Exercise for at least 150 minutes each week. The exercise should increase your heart rate and make you sweat (moderate-intensity exercise). Do strengthening exercises at least twice a week. This is in addition to the moderate-intensity exercise. Spend less time sitting. Even light physical activity can be beneficial. Watch cholesterol and blood lipids Have your blood tested for lipids and cholesterol at 65 years of age, then have this test every 5 years. Have your cholesterol levels checked more often if: Your lipid or cholesterol levels are high. You are older than 65 years of age. You are at high risk for heart disease. What should I know about cancer screening? Depending on your health history and family history, you may need to have cancer screening at various ages. This may include screening  for: Breast cancer. Cervical cancer. Colorectal cancer. Skin cancer. Lung cancer. What should I know about heart disease, diabetes, and high blood pressure? Blood pressure and heart disease High blood pressure causes heart disease and increases the risk of stroke. This is more likely to develop in people who have high blood pressure readings or are overweight. Have your blood pressure checked: Every 3-5 years if you are 25-57 years of age. Every year if you are 24 years old or older. Diabetes Have regular diabetes screenings. This checks your fasting blood sugar level. Have the screening done: Once every three years after age 62 if you are at a normal weight and have a low risk for diabetes. More often and at a younger age if you are overweight or have a high risk for diabetes. What should I know about preventing infection? Hepatitis B If you have a higher risk for hepatitis B, you should be screened for this virus. Talk with your health care provider to find out if you are at risk for hepatitis B infection. Hepatitis C Testing is recommended for: Everyone born from [birth year] through 1965. Anyone with known risk factors for hepatitis C. Sexually transmitted infections (STIs) Get screened for STIs, including gonorrhea and chlamydia, if: You are sexually active and are younger than 65 years of age. You are older than 65 years of age and your health care provider tells you that you are at risk for this type of infection. Your sexual activity has changed since you were last screened, and you are at increased risk for chlamydia or gonorrhea. Ask your health care provider if  you are at risk. Ask your health care provider about whether you are at high risk for HIV. Your health care provider may recommend a prescription medicine to help prevent HIV infection. If you choose to take medicine to prevent HIV, you should first get tested for HIV. You should then be tested every 3 months for as long as you  are taking the medicine. Osteoporosis and menopause Osteoporosis is a disease in which the bones lose minerals and strength with aging. This can result in bone fractures. If you are 72 years old or older, or if you are at risk for osteoporosis and fractures, ask your health care provider if you should: Be screened for bone loss. Take a calcium  or vitamin D supplement to lower your risk of fractures. Be given hormone replacement therapy (HRT) to treat symptoms of menopause. Follow these instructions at home: Alcohol use Do not drink alcohol if: Your health care provider tells you not to drink. You are pregnant, may be pregnant, or are planning to become pregnant. If you drink alcohol: Limit how much you have to: 0-1 drink a day. Know how much alcohol is in your drink. In the [place] one drink equals one 12 oz bottle of beer (355 mL), one 5 oz glass of wine (148 mL), or one 1 oz glass of hard liquor (44 mL). Lifestyle Do not use any products that contain nicotine or tobacco. These products include cigarettes, chewing tobacco, and vaping devices, such as e-cigarettes. If you need help quitting, ask your health care provider. Do not use street drugs. Do not share needles. Ask your health care provider for help if you need support or information about quitting drugs. General instructions Schedule regular health, dental, and eye exams. Stay current with your vaccines. Tell your health care provider if: You often feel depressed. You have ever been abused or do not feel safe at home. Summary Adopting a healthy lifestyle and getting preventive care are important in promoting health and wellness. Follow your health care provider's instructions about healthy diet, exercising, and getting tested or screened for diseases. Follow your health care provider's instructions on monitoring your cholesterol and blood pressure. This information is not intended to replace advice given to you by your health  care provider. Make sure you discuss any questions you have with your health care provider. Document Revised: 05/21/2020 Document Reviewed: 05/21/2020 Elsevier Patient Education  2024 [employer].

## 2023-12-21 NOTE — Progress Notes (Signed)
 63 y.o. [id number] postmenopausal female with stage IIIc ovarian cancer (diagnosed 2021, s/p RATLH, BSO, debulk with Dr. Eloy and chemo, followed with CA125, sees GYN ONC twice a year), osteopenia here for annual exam. Married. Retired engineer, site. PCP: Loreli Elsie JONETTA Mickey., MD   She reports concerns with blood pressure and wonders if she should be taking creatine and calcium supplement. Wants DXA report. Urine sample provided: No  Postmenopausal bleeding: none Pelvic discharge or pain: none Breast mass, nipple discharge or skin changes : none Sexually active: Yes   Last PAP:     Component Value Date/Time   DIAGPAP  03/10/2022 1451    - Negative for intraepithelial lesion or malignancy (NILM)   DIAGPAP  02/06/2021 0907    - Negative for intraepithelial lesion or malignancy (NILM)   HPVHIGH Negative 02/06/2021 0907   ADEQPAP Satisfactory for evaluation. 03/10/2022 1451   ADEQPAP  02/06/2021 0907    Satisfactory for evaluation; transformation zone component ABSENT.   Last mammogram: 07/14/23 BI-RADS 1, density B Last DXA: 11/2022 osteopenia, T-score -1.8 at right femur Last colonoscopy: 08/27/18 positive polyp, every 5 years  Exercising: Yes, cardio and strength 4 x a week at gym Smoker:No  Flowsheet Row Office Visit from 12/21/2023 in [facility]  PHQ-2 Total Score 0       GYN HISTORY: AS noted  OB History  Gravida Para Term Preterm AB Living  4 3 3  1 3   SAB IAB Ectopic Multiple Live Births      3    # Outcome Date GA Lbr Len/2nd Weight Sex Type Anes PTL Lv  4 AB           3 Term      Vag-Spont   LIV  2 Term      Vag-Spont   LIV  1 Term      Vag-Spont   LIV   Past Medical History:  Diagnosis Date   Allergy    Anemia    Broken foot 2022   left   Cancer (HCC)    ovarian   DVT (deep venous thrombosis) (HCC)    Heart murmur    benign never an issue   Numbness and tingling    Osteopenia 08/2016   T score -1.6 FRAX 7.8%/0.7%    Peripheral neuropathy, idiopathic    Pulmonary embolism (HCC)    Past Surgical History:  Procedure Laterality Date   fibroid in 2011,2003     IR IVC FILTER RETRIEVAL / S&I /IMG GUID/MOD SED  04/26/2020   IR RADIOLOGIST EVAL & MGMT  02/10/2020   miscarriage 1989     ovarian cyst 1992     Robotic-assisted laparoscopic total hysterectomy with bilateral salpingoophorectomy, omentectomy, radical tumor debulking   01/10/2020   Current Outpatient Medications on File Prior to Visit  Medication Sig Dispense Refill   Alpha-Lipoic Acid 300 MG CAPS Take 1-2 capsules (300-600 mg total) by mouth once daily 60 capsule 11   cetirizine (ZYRTEC) 10 MG tablet Chew 10 mg by mouth daily.     Cholecalciferol 50 MCG (2000 UT) CAPS Take 2,000 Units by mouth daily.     cyanocobalamin  (VITAMIN B12) 1000 MCG tablet Take 1,000 mcg by mouth daily.     fluticasone (FLONASE) 50 MCG/ACT nasal spray Place 1 spray into both nostrils daily.     gabapentin  (NEURONTIN ) 300 MG capsule Take 1 capsule (300 mg total) by mouth 3 (three) times daily. 270 capsule 3  melatonin 5 MG TABS Take 5 mg by mouth.     meloxicam  (MOBIC ) 15 MG tablet Take 1 tablet (15 mg total) by mouth daily as needed. 30 tablet 5   methocarbamol  (ROBAXIN ) 500 MG tablet Take 1 tablet (500 mg total) by mouth every 8 (eight) hours as needed. 90 tablet 1   Omega-3 Fatty Acids (FISH OIL PO) Take by mouth.     white petrolatum (VASELINE) ointment See admin instructions.     No current facility-administered medications on file prior to visit.   Social History   Socioeconomic History   Marital status: Married    Spouse name: Lamar   Number of children: 3   Years of education: [employer] education level: Not on file  Occupational History   Occupation: PRESCHOOL TEACHER    Employer: [employer]  Tobacco Use   Smoking status: Never   Smokeless tobacco: Never  Vaping Use   Vaping status: Never Used  Substance and Sexual Activity   Alcohol   use: Yes    Comment: 3 a week   Drug use: No   Sexual activity: Yes    Partners: Male    Birth control/protection: Post-menopausal, Surgical    Comment: hysterectomy  Other Topics Concern   Not on file  Social History Narrative   Patient is married Beverlie) and lives at home with her husband. Married x 36 years.   Patient has three adult children; 2 in [place] (30, 5); one in [place] (25).  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   Patient has a college education.   Patient is right-handed.   Patient drinks one cup of coffee daily.   Pillates three times per week for sixteen years.     Social Drivers of Corporate Investment Banker Strain: Not on file  Food Insecurity: Not on file  Transportation Needs: Not on file  Physical Activity: Not on file  Stress: Not on file  Social Connections: Not on file  Intimate Partner Violence: Not on file   Family History  Problem Relation Age of Onset   Leukemia Mother 12   Arthritis Father    Colon cancer Father 13   Prostate cancer Father 8   Colon cancer Paternal Uncle 51   Breast cancer Maternal Grandmother 12   Leukemia Maternal Grandfather 20   Colon cancer Cousin 59       paternal female cousin   Throat cancer Cousin 73       paternal female cousin   Prostate cancer Cousin        paternal cousin; dx early 46s; metastatic   Esophageal cancer Neg Hx    Rectal cancer Neg Hx    Stomach cancer Neg Hx    Pancreatic cancer Neg Hx    No Known Allergies    PE Today's Vitals   12/21/23 1204 12/21/23 1248  BP: (!) 160/94 (!) 144/82  Pulse: 62   Temp: 98 F (36.7 C)   TempSrc: Oral   SpO2: 97%   Weight: 160 lb (72.6 kg)   Height: 5' 4 (1.626 m)    Body mass index is 27.46 kg/m.  Physical Exam Vitals reviewed. Exam conducted with a chaperone present.  Constitutional:      General: She is not in acute distress.    Appearance: Normal appearance.  HENT:      Head: Normocephalic and atraumatic.     Nose: Nose normal.  Eyes:     Extraocular Movements: Extraocular movements intact.     Conjunctiva/sclera: Conjunctivae normal.  Pulmonary:     Effort: Pulmonary effort is normal.  Chest:     Chest wall: No mass or tenderness.  Breasts:    Right: Normal. No swelling, mass, nipple discharge or tenderness.     Left: Normal. No swelling, mass, nipple discharge or tenderness.  Abdominal:     General: There is no distension.     Palpations: Abdomen is soft.     Tenderness: There is no abdominal tenderness.  Genitourinary:    General: Normal vulva.     Exam position: Lithotomy position.     Urethra: No prolapse.     Vagina: Normal. No vaginal discharge or bleeding.     Cervix: No lesion.     Adnexa: Right adnexa normal and left adnexa normal.     Comments: Cervix and uterus absent Musculoskeletal:        General: Normal range of motion.     Cervical back: Normal range of motion.  Lymphadenopathy:     Upper Body:     Right upper body: No axillary adenopathy.     Left upper body: No axillary adenopathy.     Lower Body: No right inguinal adenopathy. No left inguinal adenopathy.  Skin:    General: Skin is warm and dry.  Neurological:     General: No focal deficit present.     Mental Status: She is alert.  Psychiatric:        Mood and Affect: Mood normal.        Behavior: Behavior normal.      Assessment and Plan:        Encounter for breast and pelvic examination Assessment & Plan: Cervical cancer screening no longer indicated s/p RTLH for ovarian cancer Encouraged annual mammogram screening Colonoscopy due, working on scheduling DXA UTD Labs and immunizations with her primary Encouraged safe sexual practices as indicated Encouraged healthy lifestyle practices with diet and exercise For patients under 50-70yo, I recommend 1200mg  calcium daily and 600IU of vitamin D  daily.   No recommendation for or against creatine at this  time To start monitoring BP at home with home cuff, f/u with PCP if BP >140/90.  Malignant neoplasm of right ovary [facility]) Assessment & Plan: Diagnosed 2021 Followed by GYN ONC twice a year and ONC yearly with surveillance of TM Unfortunately she does not meet criteria for high risk medicare breast and pelvic exams annually Medicare will only pay for an exam q70yr, I still recommend annual GYN exams, however she is aware that she may have to pay for this exams if insurance does not cover them She will discuss this with GYN ONC at her next follow-up and schedule her exams with me accordingly   Osteopenia of neck of right femur Assessment & Plan: Continue vitamin D +Calcium Encouraged weight based exercise DXA due 2026    Negative depression screening   Kristoff Coonradt LULLA Pa, MD

## 2023-12-21 NOTE — Assessment & Plan Note (Signed)
 Continue vitamin D+Calcium Encouraged weight based exercise DXA due 2026

## 2023-12-21 NOTE — Assessment & Plan Note (Addendum)
 Cervical cancer screening no longer indicated s/p RTLH for ovarian cancer Encouraged annual mammogram screening Colonoscopy due, working on scheduling DXA UTD Labs and immunizations with her primary Encouraged safe sexual practices as indicated Encouraged healthy lifestyle practices with diet and exercise For patients under 50-65yo, I recommend 1200mg  calcium daily and 600IU of vitamin D  daily.

## 2023-12-24 ENCOUNTER — Other Ambulatory Visit (HOSPITAL_COMMUNITY): Payer: Self-pay

## 2023-12-24 MED ORDER — OLMESARTAN MEDOXOMIL 20 MG PO TABS
20.0000 mg | ORAL_TABLET | Freq: Every day | ORAL | 3 refills | Status: AC
  Filled 2023-12-24: qty 90, 90d supply, fill #0

## 2024-02-19 ENCOUNTER — Encounter: Payer: Self-pay | Admitting: Internal Medicine

## 2024-03-15 ENCOUNTER — Inpatient Hospital Stay

## 2024-03-15 ENCOUNTER — Inpatient Hospital Stay: Admitting: Gynecologic Oncology

## 2024-07-01 ENCOUNTER — Inpatient Hospital Stay

## 2024-07-08 ENCOUNTER — Inpatient Hospital Stay: Admitting: Hematology and Oncology
